# Patient Record
Sex: Male | Born: 1955 | Race: White | Hispanic: No | Smoking: Current every day smoker
Health system: Southern US, Community
[De-identification: ages and names within clinical notes are randomized; demographics above are authoritative.]

## PROBLEM LIST (undated history)

## (undated) DIAGNOSIS — G250 Essential tremor: Secondary | ICD-10-CM

## (undated) DIAGNOSIS — I2510 Atherosclerotic heart disease of native coronary artery without angina pectoris: Secondary | ICD-10-CM

## (undated) DIAGNOSIS — I10 Essential (primary) hypertension: Secondary | ICD-10-CM

## (undated) DIAGNOSIS — G4700 Insomnia, unspecified: Secondary | ICD-10-CM

## (undated) DIAGNOSIS — G629 Polyneuropathy, unspecified: Secondary | ICD-10-CM

## (undated) DIAGNOSIS — K6389 Other specified diseases of intestine: Secondary | ICD-10-CM

## (undated) DIAGNOSIS — M5430 Sciatica, unspecified side: Secondary | ICD-10-CM

## (undated) DIAGNOSIS — F419 Anxiety disorder, unspecified: Secondary | ICD-10-CM

## (undated) DIAGNOSIS — R0600 Dyspnea, unspecified: Secondary | ICD-10-CM

## (undated) DIAGNOSIS — J45909 Unspecified asthma, uncomplicated: Secondary | ICD-10-CM

## (undated) DIAGNOSIS — G8929 Other chronic pain: Secondary | ICD-10-CM

## (undated) DIAGNOSIS — Z8619 Personal history of other infectious and parasitic diseases: Secondary | ICD-10-CM

## (undated) DIAGNOSIS — M549 Dorsalgia, unspecified: Secondary | ICD-10-CM

## (undated) DIAGNOSIS — M1990 Unspecified osteoarthritis, unspecified site: Secondary | ICD-10-CM

## (undated) DIAGNOSIS — H269 Unspecified cataract: Secondary | ICD-10-CM

## (undated) DIAGNOSIS — K5900 Constipation, unspecified: Secondary | ICD-10-CM

## (undated) DIAGNOSIS — M359 Systemic involvement of connective tissue, unspecified: Secondary | ICD-10-CM

## (undated) DIAGNOSIS — J449 Chronic obstructive pulmonary disease, unspecified: Secondary | ICD-10-CM

## (undated) DIAGNOSIS — J189 Pneumonia, unspecified organism: Secondary | ICD-10-CM

## (undated) HISTORY — DX: Sciatica, unspecified side: M54.30

## (undated) HISTORY — DX: Essential (primary) hypertension: I10

## (undated) HISTORY — PX: CIRCUMCISION: SUR203

## (undated) HISTORY — DX: Essential tremor: G25.0

## (undated) HISTORY — PX: OTHER SURGICAL HISTORY: SHX169

## (undated) HISTORY — PX: ABDOMINAL SURGERY: SHX537

## (undated) HISTORY — DX: Personal history of other infectious and parasitic diseases: Z86.19

## (undated) HISTORY — DX: Other specified diseases of intestine: K63.89

## (undated) HISTORY — PX: NASAL SEPTUM SURGERY: SHX37

## (undated) HISTORY — PX: TONSILLECTOMY: SUR1361

## (undated) HISTORY — DX: Atherosclerotic heart disease of native coronary artery without angina pectoris: I25.10

## (undated) HISTORY — PX: TYMPANOSTOMY TUBE PLACEMENT: SHX32

## (undated) HISTORY — PX: COLON SURGERY: SHX602

---

## 2013-02-15 ENCOUNTER — Emergency Department (HOSPITAL_COMMUNITY)
Admission: EM | Admit: 2013-02-15 | Discharge: 2013-02-15 | Disposition: A | Discharge status: Home / Self Care | Payer: Medicaid - Out of State | Attending: Emergency Medicine | Admitting: Emergency Medicine

## 2013-02-15 ENCOUNTER — Encounter (HOSPITAL_COMMUNITY): Payer: Self-pay

## 2013-02-15 ENCOUNTER — Emergency Department (HOSPITAL_COMMUNITY): Payer: Medicaid - Out of State

## 2013-02-15 DIAGNOSIS — J329 Chronic sinusitis, unspecified: Secondary | ICD-10-CM | POA: Insufficient documentation

## 2013-02-15 DIAGNOSIS — W010XXA Fall on same level from slipping, tripping and stumbling without subsequent striking against object, initial encounter: Secondary | ICD-10-CM | POA: Insufficient documentation

## 2013-02-15 DIAGNOSIS — J209 Acute bronchitis, unspecified: Secondary | ICD-10-CM

## 2013-02-15 DIAGNOSIS — J3489 Other specified disorders of nose and nasal sinuses: Secondary | ICD-10-CM | POA: Insufficient documentation

## 2013-02-15 DIAGNOSIS — M549 Dorsalgia, unspecified: Secondary | ICD-10-CM | POA: Insufficient documentation

## 2013-02-15 DIAGNOSIS — IMO0002 Reserved for concepts with insufficient information to code with codable children: Secondary | ICD-10-CM | POA: Insufficient documentation

## 2013-02-15 DIAGNOSIS — R51 Headache: Secondary | ICD-10-CM | POA: Insufficient documentation

## 2013-02-15 DIAGNOSIS — R0982 Postnasal drip: Secondary | ICD-10-CM | POA: Insufficient documentation

## 2013-02-15 DIAGNOSIS — S46911A Strain of unspecified muscle, fascia and tendon at shoulder and upper arm level, right arm, initial encounter: Secondary | ICD-10-CM

## 2013-02-15 DIAGNOSIS — Z8669 Personal history of other diseases of the nervous system and sense organs: Secondary | ICD-10-CM | POA: Insufficient documentation

## 2013-02-15 DIAGNOSIS — H6120 Impacted cerumen, unspecified ear: Secondary | ICD-10-CM | POA: Insufficient documentation

## 2013-02-15 DIAGNOSIS — I10 Essential (primary) hypertension: Secondary | ICD-10-CM | POA: Insufficient documentation

## 2013-02-15 DIAGNOSIS — Y939 Activity, unspecified: Secondary | ICD-10-CM | POA: Insufficient documentation

## 2013-02-15 DIAGNOSIS — Y929 Unspecified place or not applicable: Secondary | ICD-10-CM | POA: Insufficient documentation

## 2013-02-15 DIAGNOSIS — F17200 Nicotine dependence, unspecified, uncomplicated: Secondary | ICD-10-CM | POA: Insufficient documentation

## 2013-02-15 DIAGNOSIS — Z79899 Other long term (current) drug therapy: Secondary | ICD-10-CM | POA: Insufficient documentation

## 2013-02-15 DIAGNOSIS — H938X9 Other specified disorders of ear, unspecified ear: Secondary | ICD-10-CM | POA: Insufficient documentation

## 2013-02-15 DIAGNOSIS — R05 Cough: Secondary | ICD-10-CM | POA: Insufficient documentation

## 2013-02-15 DIAGNOSIS — R062 Wheezing: Secondary | ICD-10-CM | POA: Insufficient documentation

## 2013-02-15 DIAGNOSIS — R059 Cough, unspecified: Secondary | ICD-10-CM | POA: Insufficient documentation

## 2013-02-15 DIAGNOSIS — G8929 Other chronic pain: Secondary | ICD-10-CM | POA: Insufficient documentation

## 2013-02-15 HISTORY — DX: Other chronic pain: G89.29

## 2013-02-15 HISTORY — DX: Dorsalgia, unspecified: M54.9

## 2013-02-15 MED ORDER — ALBUTEROL SULFATE (5 MG/ML) 0.5% IN NEBU
5.0000 mg | INHALATION_SOLUTION | Freq: Once | RESPIRATORY_TRACT | Status: AC
  Administered 2013-02-15: 5 mg via RESPIRATORY_TRACT
  Filled 2013-02-15: qty 1

## 2013-02-15 MED ORDER — AMOXICILLIN 500 MG PO CAPS: 500.0000 mg | ORAL_CAPSULE | Freq: Three times a day (TID) | ORAL | Status: DC

## 2013-02-15 MED ORDER — BENZONATATE 100 MG PO CAPS
200.0000 mg | ORAL_CAPSULE | Freq: Once | ORAL | Status: AC
  Administered 2013-02-15: 200 mg via ORAL
  Filled 2013-02-15: qty 2

## 2013-02-15 MED ORDER — BENZONATATE 100 MG PO CAPS: 100.0000 mg | ORAL_CAPSULE | Freq: Three times a day (TID) | ORAL | Status: DC

## 2013-02-15 MED ORDER — ALBUTEROL SULFATE HFA 108 (90 BASE) MCG/ACT IN AERS: 1.0000 | INHALATION_SPRAY | Freq: Four times a day (QID) | RESPIRATORY_TRACT | Status: DC | PRN

## 2013-02-15 MED ORDER — PREDNISONE 10 MG PO TABS: ORAL_TABLET | ORAL | Status: DC

## 2013-02-15 MED ORDER — ALBUTEROL SULFATE (5 MG/ML) 0.5% IN NEBU
INHALATION_SOLUTION | RESPIRATORY_TRACT | Status: AC
  Filled 2013-02-15: qty 1

## 2013-02-15 MED ORDER — METHOCARBAMOL 500 MG PO TABS: 1000.0000 mg | ORAL_TABLET | Freq: Four times a day (QID) | ORAL | Status: AC

## 2013-02-15 MED ORDER — ALBUTEROL SULFATE (5 MG/ML) 0.5% IN NEBU
5.0000 mg | INHALATION_SOLUTION | Freq: Once | RESPIRATORY_TRACT | Status: AC
  Administered 2013-02-15: 5 mg via RESPIRATORY_TRACT

## 2013-02-15 MED ORDER — IPRATROPIUM BROMIDE 0.02 % IN SOLN
RESPIRATORY_TRACT | Status: AC
  Filled 2013-02-15: qty 2.5

## 2013-02-15 MED ORDER — IPRATROPIUM BROMIDE 0.02 % IN SOLN
0.5000 mg | Freq: Once | RESPIRATORY_TRACT | Status: AC
  Administered 2013-02-15: 0.5 mg via RESPIRATORY_TRACT

## 2013-02-15 MED ORDER — OXYCODONE-ACETAMINOPHEN 5-325 MG PO TABS
2.0000 | ORAL_TABLET | Freq: Once | ORAL | Status: AC
  Administered 2013-02-15: 2 via ORAL
  Filled 2013-02-15: qty 2

## 2013-02-15 MED ORDER — IPRATROPIUM BROMIDE 0.02 % IN SOLN
0.5000 mg | Freq: Once | RESPIRATORY_TRACT | Status: AC
  Administered 2013-02-15: 0.5 mg via RESPIRATORY_TRACT
  Filled 2013-02-15: qty 2.5

## 2013-02-15 NOTE — ED Notes (Signed)
Complain of left ear stopped up, sinus infection and right arm injury

## 2013-02-15 NOTE — ED Notes (Signed)
Pt requested something for pain.  Says has a friend that can drive him home.  Notified PA and percocet given.

## 2013-02-19 NOTE — ED Provider Notes (Signed)
History     CSN: [id number]  Arrival date & time 02/15/13  0906   First MD Initiated Contact with Patient 02/15/13 [phone]      Chief Complaint  Patient presents with  . Otalgia    (Consider location/radiation/quality/duration/timing/severity/associated sxs/prior treatment) HPI Comments: Peter Campbell is a 57 y.o. Male who is visiting relatives here for the next month,  With multiple complaints.  First,  He describes nasal congestion along with thick nasal discharge and facial pain, left ear pressure and decreased hearing along with persistent post nasal drip and occasional productive cough.  He denies shortness of breath and chest pain currently but has had intermittent wheezing.  Additionally, he describes acute on chronic right shoulder pain which is worse with movement ad treated with roxicodone and norco by his pcp.  He has had increased pain since tripping and landing on his right side last week.  He denies weakness and numbness in his right arm or hand.  Pain is worse with movement and improved at rest.   The history is provided by the patient.    Past Medical History  Diagnosis Date  . Hypertension   . Chronic back pain   . Tremors of nervous system     Past Surgical History  Procedure Laterality Date  . Colon surgery    . Abdominal surgery      No family history on file.  History  Substance Use Topics  . Smoking status: Current Some Day Smoker  . Smokeless tobacco: Not on file  . Alcohol Use: No      Review of Systems  Constitutional: Negative for fever and chills.  HENT: Positive for congestion, rhinorrhea and sinus pressure. Negative for ear pain, sore throat and neck pain.   Eyes: Negative.   Respiratory: Positive for cough and wheezing. Negative for chest tightness and shortness of breath.   Cardiovascular: Negative for chest pain.  Gastrointestinal: Negative for nausea and abdominal pain.  Genitourinary: Negative.   Musculoskeletal: Positive for  arthralgias. Negative for joint swelling.  Skin: Negative.  Negative for rash and wound.  Neurological: Negative for dizziness, weakness, light-headedness, numbness and headaches.  Psychiatric/Behavioral: Negative.     Allergies  Morphine and related  Home Medications   Current Outpatient Rx  Name  Route  Sig  Dispense  Refill  . amLODipine (NORVASC) 5 MG tablet   Oral   Take 5 mg by mouth daily.         . celecoxib (CELEBREX) 200 MG capsule   Oral   Take 200 mg by mouth daily.         Marland Kitchen HYDROcodone-acetaminophen (NORCO) 10-325 MG per tablet   Oral   Take 1 tablet by mouth every 6 (six) hours as needed for pain (breakthrough pain).         Marland Kitchen lisinopril (PRINIVIL,ZESTRIL) 10 MG tablet   Oral   Take 10 mg by mouth at bedtime.         Marland Kitchen oxycodone (ROXICODONE) 30 MG immediate release tablet   Oral   Take 30 mg by mouth 2 (two) times daily.         . pregabalin (LYRICA) 100 MG capsule   Oral   Take 100 mg by mouth 2 (two) times daily.         . temazepam (RESTORIL) 30 MG capsule   Oral   Take 30 mg by mouth at bedtime as needed for sleep.         Marland Kitchen albuterol (  PROVENTIL HFA;VENTOLIN HFA) 108 (90 BASE) MCG/ACT inhaler   Inhalation   Inhale 1-2 puffs into the lungs every 6 (six) hours as needed for wheezing.   1 Inhaler   0   . amoxicillin (AMOXIL) 500 MG capsule   Oral   Take 1 capsule (500 mg total) by mouth 3 (three) times daily.   30 capsule   0   . benzonatate (TESSALON) 100 MG capsule   Oral   Take 1 capsule (100 mg total) by mouth every 8 (eight) hours.   21 capsule   0   . methocarbamol (ROBAXIN) 500 MG tablet   Oral   Take 2 tablets (1,000 mg total) by mouth 4 (four) times daily.   40 tablet   0   . predniSONE (DELTASONE) 10 MG tablet      6, 5, 4, 3, 2 then 1 tablet by mouth daily for 6 days total.   21 tablet   0     BP 128/79  Pulse 79  Temp(Src) 97.4 F (36.3 C)  Resp 20  Ht 5\' 10"  (1.778 m)  Wt 180 lb (81.647 kg)  BMI  25.83 kg/m2  SpO2 96%  Physical Exam  Constitutional: He is oriented to person, place, and time. He appears well-developed and well-nourished.  HENT:  Head: Normocephalic and atraumatic.  Right Ear: Tympanic membrane and ear canal normal. Tympanic membrane is not injected and not erythematous.  Left Ear: Tympanic membrane and ear canal normal.  Nose: Mucosal edema and rhinorrhea present. Right sinus exhibits maxillary sinus tenderness. Left sinus exhibits maxillary sinus tenderness.  Mouth/Throat: Uvula is midline, oropharynx is clear and moist and mucous membranes are normal. No oropharyngeal exudate, posterior oropharyngeal edema, posterior oropharyngeal erythema or tonsillar abscesses.  Left ear canal cerumen impaction.  Eyes: Conjunctivae are normal.  Cardiovascular: Normal rate and normal heart sounds.   Pulmonary/Chest: Effort normal. No respiratory distress. He has no decreased breath sounds. He has wheezes. He has no rhonchi. He has no rales.  Prolonged expiratory phase.  Abdominal: Soft. There is no tenderness.  Musculoskeletal: Normal range of motion.       Right shoulder: He exhibits bony tenderness and spasm. He exhibits no swelling, no effusion, no crepitus, no deformity, normal pulse and normal strength.  ttp with spasm across right upper shoulder/trapezius musculature.  Neurological: He is alert and oriented to person, place, and time.  Skin: Skin is warm and dry. No rash noted.  Psychiatric: He has a normal mood and affect.    ED Course  Procedures (including critical care time)  Left ear flushed by RN with complete removal of cerum, normal left tm,  Outer canal normal,  Hearing improved.  Pt was given albuterol /atrovent neb also with resolution of wheezing,  Cough improved.  Labs Reviewed - No data to display No results found.   1. Sinusitis   2. Shoulder strain, right, initial encounter   3. Bronchitis with bronchospasm    4.  Cerumen impaction   MDM   Patients labs and/or radiological studies were viewed and considered during the medical decision making and disposition process.  Pt given right arm sling for comfort,  encouraged rest,  Heat tx.  Pt prescribed prednisone 6 day taper,  Albuterol for bronchospasm.  Tessalon perles prn cough,  Amoxil for acute sinusitis.  Robaxin also for right shoulder and upper back spasm.  Pt encouraged to continue with other pain meds as prescribed by pcp.  Recheck prn worsened sx.  Burgess Amor, PA-C 02/19/13 1338

## 2013-02-19 NOTE — ED Provider Notes (Signed)
Medical screening examination/treatment/procedure(s) were performed by non-physician practitioner and as supervising physician I was immediately available for consultation/collaboration.   Glynn Octave, MD 02/19/13 1540

## 2013-08-07 ENCOUNTER — Other Ambulatory Visit (HOSPITAL_COMMUNITY): Payer: Self-pay | Admitting: General Practice

## 2013-08-07 DIAGNOSIS — R222 Localized swelling, mass and lump, trunk: Secondary | ICD-10-CM

## 2013-08-09 ENCOUNTER — Ambulatory Visit (HOSPITAL_COMMUNITY)
Admission: RE | Admit: 2013-08-09 | Discharge: 2013-08-09 | Disposition: A | Discharge status: Home / Self Care | Payer: Medicare Other | Source: Ambulatory Visit | Attending: General Practice | Admitting: General Practice

## 2013-08-09 DIAGNOSIS — R222 Localized swelling, mass and lump, trunk: Secondary | ICD-10-CM

## 2013-08-09 DIAGNOSIS — R911 Solitary pulmonary nodule: Secondary | ICD-10-CM | POA: Insufficient documentation

## 2013-08-09 DIAGNOSIS — Z09 Encounter for follow-up examination after completed treatment for conditions other than malignant neoplasm: Secondary | ICD-10-CM | POA: Diagnosis not present

## 2013-08-17 DIAGNOSIS — G250 Essential tremor: Secondary | ICD-10-CM | POA: Insufficient documentation

## 2013-08-17 DIAGNOSIS — F1011 Alcohol abuse, in remission: Secondary | ICD-10-CM | POA: Insufficient documentation

## 2013-08-17 DIAGNOSIS — I2510 Atherosclerotic heart disease of native coronary artery without angina pectoris: Secondary | ICD-10-CM | POA: Insufficient documentation

## 2013-08-17 DIAGNOSIS — Z87898 Personal history of other specified conditions: Secondary | ICD-10-CM | POA: Insufficient documentation

## 2013-08-17 DIAGNOSIS — I10 Essential (primary) hypertension: Secondary | ICD-10-CM | POA: Insufficient documentation

## 2013-08-17 DIAGNOSIS — G629 Polyneuropathy, unspecified: Secondary | ICD-10-CM | POA: Insufficient documentation

## 2013-09-04 ENCOUNTER — Other Ambulatory Visit (HOSPITAL_COMMUNITY): Payer: Self-pay | Admitting: Physician Assistant

## 2013-09-04 DIAGNOSIS — M2550 Pain in unspecified joint: Secondary | ICD-10-CM

## 2013-09-07 ENCOUNTER — Ambulatory Visit (HOSPITAL_COMMUNITY)
Admission: RE | Admit: 2013-09-07 | Discharge: 2013-09-07 | Disposition: A | Discharge status: Home / Self Care | Payer: Medicare Other | Source: Ambulatory Visit | Attending: Physician Assistant | Admitting: Physician Assistant

## 2013-09-07 DIAGNOSIS — S46819A Strain of other muscles, fascia and tendons at shoulder and upper arm level, unspecified arm, initial encounter: Secondary | ICD-10-CM | POA: Diagnosis not present

## 2013-09-07 DIAGNOSIS — M67919 Unspecified disorder of synovium and tendon, unspecified shoulder: Secondary | ICD-10-CM | POA: Insufficient documentation

## 2013-09-07 DIAGNOSIS — M719 Bursopathy, unspecified: Secondary | ICD-10-CM | POA: Insufficient documentation

## 2013-09-07 DIAGNOSIS — M25519 Pain in unspecified shoulder: Secondary | ICD-10-CM | POA: Insufficient documentation

## 2013-09-07 DIAGNOSIS — X58XXXA Exposure to other specified factors, initial encounter: Secondary | ICD-10-CM | POA: Insufficient documentation

## 2013-09-07 DIAGNOSIS — M259 Joint disorder, unspecified: Secondary | ICD-10-CM | POA: Diagnosis not present

## 2013-09-07 DIAGNOSIS — M2550 Pain in unspecified joint: Secondary | ICD-10-CM

## 2013-09-25 DIAGNOSIS — G250 Essential tremor: Secondary | ICD-10-CM

## 2013-09-25 DIAGNOSIS — M549 Dorsalgia, unspecified: Secondary | ICD-10-CM

## 2013-09-25 DIAGNOSIS — G252 Other specified forms of tremor: Secondary | ICD-10-CM | POA: Insufficient documentation

## 2013-09-25 DIAGNOSIS — Z2821 Immunization not carried out because of patient refusal: Secondary | ICD-10-CM | POA: Insufficient documentation

## 2013-09-25 DIAGNOSIS — Z79899 Other long term (current) drug therapy: Secondary | ICD-10-CM

## 2013-09-25 DIAGNOSIS — G4700 Insomnia, unspecified: Secondary | ICD-10-CM

## 2013-09-25 DIAGNOSIS — K5900 Constipation, unspecified: Secondary | ICD-10-CM

## 2013-09-25 DIAGNOSIS — G8929 Other chronic pain: Secondary | ICD-10-CM | POA: Insufficient documentation

## 2013-09-25 DIAGNOSIS — F411 Generalized anxiety disorder: Secondary | ICD-10-CM

## 2013-09-25 DIAGNOSIS — M79609 Pain in unspecified limb: Secondary | ICD-10-CM

## 2013-09-25 DIAGNOSIS — I10 Essential (primary) hypertension: Secondary | ICD-10-CM

## 2013-09-25 DIAGNOSIS — F419 Anxiety disorder, unspecified: Secondary | ICD-10-CM

## 2013-09-25 DIAGNOSIS — Z87891 Personal history of nicotine dependence: Secondary | ICD-10-CM

## 2013-09-25 DIAGNOSIS — R911 Solitary pulmonary nodule: Secondary | ICD-10-CM

## 2013-09-25 DIAGNOSIS — IMO0002 Reserved for concepts with insufficient information to code with codable children: Secondary | ICD-10-CM | POA: Insufficient documentation

## 2013-09-25 DIAGNOSIS — G894 Chronic pain syndrome: Secondary | ICD-10-CM

## 2013-09-25 DIAGNOSIS — M25519 Pain in unspecified shoulder: Secondary | ICD-10-CM | POA: Insufficient documentation

## 2013-09-26 ENCOUNTER — Ambulatory Visit (INDEPENDENT_AMBULATORY_CARE_PROVIDER_SITE_OTHER): Payer: Medicare Other | Admitting: Cardiology

## 2013-09-26 ENCOUNTER — Encounter: Payer: Self-pay | Admitting: Cardiology

## 2013-09-26 VITALS — BP 125/82 | HR 89 | Ht 70.0 in | Wt 166.0 lb

## 2013-09-26 DIAGNOSIS — I2510 Atherosclerotic heart disease of native coronary artery without angina pectoris: Secondary | ICD-10-CM

## 2013-09-26 DIAGNOSIS — F17200 Nicotine dependence, unspecified, uncomplicated: Secondary | ICD-10-CM

## 2013-09-26 DIAGNOSIS — I10 Essential (primary) hypertension: Secondary | ICD-10-CM

## 2013-09-26 DIAGNOSIS — Z720 Tobacco use: Secondary | ICD-10-CM | POA: Insufficient documentation

## 2013-09-26 MED ORDER — NITROGLYCERIN 0.4 MG SL SUBL: 0.4000 mg | SUBLINGUAL_TABLET | SUBLINGUAL | Status: DC | PRN

## 2013-09-26 NOTE — Assessment & Plan Note (Signed)
Based on chest CT imaging from November of this year. Patient is reporting intermittent exertional angina symptoms. His ECG today is normal. We have discussed options for evaluation including objective functional ischemic testing versus coronary angiography. Plan at this point is to continue medical therapy and proceed with a Lexiscan Cardiolite to assess ischemic burden. Based on this we can determine whether we will continue observation versus proceeding on to assess revascularization options. We did give him a prescription for nitroglycerin, will also assess lipid panel.

## 2013-09-26 NOTE — Assessment & Plan Note (Signed)
Smoking cessation has been discussed. 

## 2013-09-26 NOTE — Patient Instructions (Signed)
Your physician recommends that you schedule a follow-up appointment in: one month   The proper use and anticipated side effects of nitroglycerine has been carefully explained.  If a single episode of chest pain is not relieved by one tablet, the patient will try another within 5 minutes; and if this doesn't relieve the pain, the patient is instructed to call 911 for transportation to an emergency department.  Your physician has requested that you have a lexiscan myoview. For further information please visit [email or web address] Please follow instruction sheet, as given.   Your physician recommends that you return for lab work tomorrow   Do not eat or drink after midnight

## 2013-09-26 NOTE — Progress Notes (Signed)
Clinical Summary Peter Campbell is a 56 y.o.male referred for cardiology evaluation by Dr. Reinaldo Berber at [facility]. He reports a history of intermittent exertional chest pain suggestive of angina over the last few months at least. He tells me that he relocated from [place] back to this area to be closer to family, moved in March of this year. He had undergone prior cardiac testing approximately 3 years ago, tells me that things looked "normal." He does have a family history of CAD, brother died suddenly with a massive heart attack.  Patient was seen by [facility] cardiology in November, details are not clear. He did undergo a CT scan of his chest at [facility] also in November that demonstrated dense coronary atherosclerosis, presumably prompting his referral. He has not undergone any objective functional ischemic testing as yet.  Medications are outlined below. He is also taking an aspirin daily. No assessment of lipids found.  ECG done in clinic today shows normal sinus rhythm.   Allergies  Allergen Reactions  . Morphine And Related Itching    Current Outpatient Prescriptions  Medication Sig Dispense Refill  . ALPRAZolam (XANAX) 1 MG tablet Take 1 mg by mouth 3 (three) times daily.      Marland Kitchen amLODipine (NORVASC) 5 MG tablet Take 5 mg by mouth daily.      Marland Kitchen gabapentin (NEURONTIN) 400 MG capsule Take 400 mg by mouth 3 (three) times daily.      . metoprolol succinate (TOPROL-XL) 25 MG 24 hr tablet Take 25 mg by mouth 2 (two) times daily.      . temazepam (RESTORIL) 30 MG capsule Take 30 mg by mouth at bedtime as needed for sleep.      . nitroGLYCERIN (NITROSTAT) 0.4 MG SL tablet Place 1 tablet (0.4 mg total) under the tongue every 5 (five) minutes as needed for chest pain.  25 tablet  3   No current facility-administered medications for this visit.    Past Medical History  Diagnosis Date  . Essential hypertension, benign   . Chronic back pain   . Essential tremor   . Coronary  atherosclerosis of native coronary artery     Dense  coronary atherosclerosis by chest CT November 2014   . History of chicken pox   . History of [place] measles   . Sciatica     Past Surgical History  Procedure Laterality Date  . Colon surgery    . Abdominal surgery    . Tonsillectomy    . Circumcision      Family History  Problem Relation Age of Onset  . COPD Father   . Cancer Father   . Cancer Mother   . Heart attack Brother   . Cancer Sister     Social History Mr. Dumont reports that he has been smoking Cigarettes.  He has been smoking about 0.00 packs per day. He does not have any smokeless tobacco history on file. Mr. Willden reports that he does not drink alcohol.  Review of Systems Chronic tremor. Limited ambulation, back pain. No palpitations or syncope. No orthopnea or PND. Stable appetite, no bleeding problems. Otherwise negative.  Physical Examination Filed Vitals:   09/26/13 0850  BP: 125/82  Pulse: 89   Filed Weights   09/26/13 0850  Weight: 166 lb (75.297 kg)   The patient appears comfortable at rest. HEENT: Conjunctiva and lids normal, oropharynx clear with moist mucosa. Neck: Supple, no elevated JVP or carotid bruits, no thyromegaly. Lungs: Clear to  auscultation, nonlabored breathing at rest. Cardiac: Regular rate and rhythm, no S3, soft systolic murmur, no pericardial rub. Abdomen: Soft, nontender, bowel sounds present, no guarding or rebound. Extremities: No pitting edema, distal pulses 1-2+. Skin: Warm and dry. Scattered tattoos. Musculoskeletal: No kyphosis. Neuropsychiatric: Alert and oriented x3, affect grossly appropriate. Resting tremor.   Problem List and Plan   Coronary atherosclerosis of native coronary artery Based on chest CT imaging from November of this year. Patient is reporting intermittent exertional angina symptoms. His ECG today is normal. We have discussed options for evaluation including objective functional ischemic testing  versus coronary angiography. Plan at this point is to continue medical therapy and proceed with a Lexiscan Cardiolite to assess ischemic burden. Based on this we can determine whether we will continue observation versus proceeding on to assess revascularization options. We did give him a prescription for nitroglycerin, will also assess lipid panel.  Tobacco abuse Smoking cessation has been discussed.  Essential hypertension, benign Blood pressure reasonable today, no changes made in antihypertensive regimen.    Jonelle Sidle, M.D., F.A.C.C.

## 2013-09-26 NOTE — Assessment & Plan Note (Signed)
Blood pressure reasonable today, no changes made in antihypertensive regimen.

## 2013-09-27 LAB — HEPATIC FUNCTION PANEL
ALT: 8 U/L (ref 0–53)
Albumin: 4 g/dL (ref 3.5–5.2)
Total Protein: 6.5 g/dL (ref 6.0–8.3)

## 2013-09-27 LAB — LIPID PANEL
HDL: 27 mg/dL — ABNORMAL LOW (ref >39)
LDL Cholesterol: 49 mg/dL (ref 0–99)
Total CHOL/HDL Ratio: 3.6 Ratio
VLDL: 20 mg/dL (ref 0–40)

## 2013-10-03 ENCOUNTER — Encounter (HOSPITAL_COMMUNITY): Payer: Self-pay

## 2013-10-03 ENCOUNTER — Encounter (HOSPITAL_COMMUNITY)
Admission: RE | Admit: 2013-10-03 | Discharge: 2013-10-03 | Disposition: A | Discharge status: Home / Self Care | Payer: Medicare Other | Source: Ambulatory Visit | Attending: Cardiology | Admitting: Cardiology

## 2013-10-03 DIAGNOSIS — I2510 Atherosclerotic heart disease of native coronary artery without angina pectoris: Secondary | ICD-10-CM

## 2013-10-03 DIAGNOSIS — R079 Chest pain, unspecified: Secondary | ICD-10-CM

## 2013-10-03 DIAGNOSIS — I10 Essential (primary) hypertension: Secondary | ICD-10-CM | POA: Insufficient documentation

## 2013-10-03 HISTORY — DX: Systemic involvement of connective tissue, unspecified: M35.9

## 2013-10-03 HISTORY — DX: Unspecified asthma, uncomplicated: J45.909

## 2013-10-03 MED ORDER — SODIUM CHLORIDE 0.9 % IJ SOLN
INTRAMUSCULAR | Status: AC
  Administered 2013-10-03: 10 mL via INTRAVENOUS
  Filled 2013-10-03: qty 10

## 2013-10-03 MED ORDER — TECHNETIUM TC 99M SESTAMIBI - CARDIOLITE
10.0000 | Freq: Once | INTRAVENOUS | Status: AC | PRN
  Administered 2013-10-03: 08:00:00 10 via INTRAVENOUS

## 2013-10-03 MED ORDER — REGADENOSON 0.4 MG/5ML IV SOLN
INTRAVENOUS | Status: AC
  Administered 2013-10-03: 0.4 mg via INTRAVENOUS
  Filled 2013-10-03: qty 5

## 2013-10-03 MED ORDER — TECHNETIUM TC 99M SESTAMIBI - CARDIOLITE
30.0000 | Freq: Once | INTRAVENOUS | Status: AC | PRN
  Administered 2013-10-03: 30 via INTRAVENOUS

## 2013-10-03 NOTE — Progress Notes (Signed)
Stress Lab Nurses Notes - Peter Campbell  Peter Campbell 10/03/2013 Reason for doing test: CAD and Chest Pain Type of test: Marlane HatcherLexiscan Cardiolite Nurse performing test: Peter PoissonPhyllis Billingsly, RN Nuclear Medicine Tech: Peter Campbell Echo Tech: Not Applicable MD performing test: Peter Campbell Family MD: Peter Campbell Test explained and consent signed: yes IV started: 22g jelco, Saline lock flushed, No redness or edema and Saline lock started in radiology Symptoms: headache Treatment/Intervention: None Reason test stopped: protocol completed After recovery IV was: Discontinued via X-ray tech and No redness or edema Patient to return to Nuc. Med at : 9:30 Patient discharged: Home Patient's Condition upon discharge was: stable Comments: During test BP 119/55 & HR 99.  Recovery BP 127/70 & HR 74.  Symptoms resolved in recovery. Peter Campbell, Peter Campbell

## 2013-10-12 DIAGNOSIS — M545 Low back pain, unspecified: Secondary | ICD-10-CM | POA: Insufficient documentation

## 2013-10-12 DIAGNOSIS — R079 Chest pain, unspecified: Secondary | ICD-10-CM | POA: Insufficient documentation

## 2013-10-12 DIAGNOSIS — K5790 Diverticulosis of intestine, part unspecified, without perforation or abscess without bleeding: Secondary | ICD-10-CM | POA: Insufficient documentation

## 2013-10-31 ENCOUNTER — Encounter: Payer: Self-pay | Admitting: Cardiology

## 2013-10-31 ENCOUNTER — Ambulatory Visit (INDEPENDENT_AMBULATORY_CARE_PROVIDER_SITE_OTHER): Payer: Medicare Other | Admitting: Cardiology

## 2013-10-31 VITALS — BP 128/78 | HR 75 | Ht 70.0 in | Wt 171.5 lb

## 2013-10-31 DIAGNOSIS — F17200 Nicotine dependence, unspecified, uncomplicated: Secondary | ICD-10-CM

## 2013-10-31 DIAGNOSIS — I2510 Atherosclerotic heart disease of native coronary artery without angina pectoris: Secondary | ICD-10-CM

## 2013-10-31 DIAGNOSIS — Z720 Tobacco use: Secondary | ICD-10-CM

## 2013-10-31 DIAGNOSIS — I10 Essential (primary) hypertension: Secondary | ICD-10-CM

## 2013-10-31 MED ORDER — ASPIRIN EC 81 MG PO TBEC: 81.0000 mg | DELAYED_RELEASE_TABLET | Freq: Every day | ORAL | Status: DC

## 2013-10-31 NOTE — Progress Notes (Signed)
Clinical Summary Peter Campbell is a 58 y.o.male last seen in December 2014. States that he feels better in general, no regular angina symptoms.  Lexiscan Cardiolite from January 2015 showed no evidence of scar or ischemia with normal LVEF 63%. We reviewed this today. Discussed medical therapy and observation.  Lab work from December 2014 showed total cholesterol 96, triglycerides 98, HDL 27, LDL 49, normal LFTs. Not on statin at this time.   Allergies  Allergen Reactions  . Morphine And Related Itching    Current Outpatient Prescriptions  Medication Sig Dispense Refill  . ALPRAZolam (XANAX) 1 MG tablet Take 1 mg by mouth 3 (three) times daily.      Marland Kitchen amLODipine (NORVASC) 5 MG tablet Take 5 mg by mouth daily.      Marland Kitchen gabapentin (NEURONTIN) 400 MG capsule Take 400 mg by mouth 3 (three) times daily.      Marland Kitchen HYDROcodone-acetaminophen (NORCO/VICODIN) 5-325 MG per tablet Take 1 tablet by mouth every 6 (six) hours as needed.       . metoprolol succinate (TOPROL-XL) 25 MG 24 hr tablet Take 25 mg by mouth 2 (two) times daily.      . nitroGLYCERIN (NITROSTAT) 0.4 MG SL tablet Place 1 tablet (0.4 mg total) under the tongue every 5 (five) minutes as needed for chest pain.  25 tablet  3  . temazepam (RESTORIL) 30 MG capsule Take 30 mg by mouth at bedtime as needed for sleep.      Marland Kitchen aspirin EC 81 MG tablet Take 1 tablet (81 mg total) by mouth daily.  90 tablet  3   No current facility-administered medications for this visit.    Past Medical History  Diagnosis Date  . Essential hypertension, benign   . Chronic back pain   . Essential tremor   . Coronary atherosclerosis of native coronary artery     Dense  coronary atherosclerosis by chest CT November 2014   . History of chicken pox   . History of [place] measles   . Sciatica   . Collagen vascular disease   . Asthma     Social History Mr. Ferreras reports that he has been smoking Cigarettes.  He has a 21 pack-year smoking history. He does not  have any smokeless tobacco history on file. Mr. Quebedeaux reports that he does not drink alcohol.  Review of Systems Chronic back pain. Otherwise negative.  Physical Examination Filed Vitals:   10/31/13 0816  BP: 128/78  Pulse: 75   Filed Weights   10/31/13 0816  Weight: 171 lb 8 oz (77.792 kg)    The patient appears comfortable at rest.  HEENT: Conjunctiva and lids normal, oropharynx clear with moist mucosa.  Neck: Supple, no elevated JVP or carotid bruits, no thyromegaly.  Lungs: Clear to auscultation, nonlabored breathing at rest.  Cardiac: Regular rate and rhythm, no S3, soft systolic murmur, no pericardial rub.  Abdomen: Soft, nontender, bowel sounds present, no guarding or rebound.  Extremities: No pitting edema, distal pulses 1-2+.  Skin: Warm and dry. Scattered tattoos.  Musculoskeletal: No kyphosis.  Neuropsychiatric: Alert and oriented x3, affect grossly appropriate. Resting tremor.   Problem List and Plan   Coronary atherosclerosis of native coronary artery Based on CT imaging of the chest. Recent Cardiolite showed no active ischemia however with normal LVEF, and our plan will be medical therapy and observation. He continues on aspirin, beta blocker, Norvasc. No statin with low lipids at baseline. Followup in 6 months.  Essential hypertension,  benign Blood pressure control reasonable today. No changes made.  Tobacco abuse Continue to work on smoking cessation.    Jonelle SidleSamuel G. McDowell, M.D., F.A.C.C.

## 2013-10-31 NOTE — Assessment & Plan Note (Signed)
Continue to work on smoking cessation. 

## 2013-10-31 NOTE — Assessment & Plan Note (Signed)
Based on CT imaging of the chest. Recent Cardiolite showed no active ischemia however with normal LVEF, and our plan will be medical therapy and observation. He continues on aspirin, beta blocker, Norvasc. No statin with low lipids at baseline. Followup in 6 months.

## 2013-10-31 NOTE — Patient Instructions (Addendum)
Your physician wants you to follow-up in:6 months  You will receive a reminder letter in the mail two months in advance. If you don't receive a letter, please call our office to schedule the follow-up appointment.   Your physician has recommended you make the following change in your medication:   START aspirin 81 mg daily

## 2013-10-31 NOTE — Assessment & Plan Note (Signed)
Blood pressure control reasonable today. No changes made. 

## 2014-05-21 ENCOUNTER — Other Ambulatory Visit (HOSPITAL_COMMUNITY): Payer: Self-pay | Admitting: Orthopaedic Surgery

## 2014-05-21 DIAGNOSIS — M25511 Pain in right shoulder: Secondary | ICD-10-CM

## 2014-05-22 ENCOUNTER — Encounter: Payer: Self-pay | Admitting: Cardiology

## 2014-05-22 ENCOUNTER — Ambulatory Visit (INDEPENDENT_AMBULATORY_CARE_PROVIDER_SITE_OTHER): Payer: Medicaid Other | Admitting: Cardiology

## 2014-05-22 VITALS — BP 128/82 | HR 61 | Ht 70.0 in | Wt 184.0 lb

## 2014-05-22 DIAGNOSIS — I209 Angina pectoris, unspecified: Secondary | ICD-10-CM

## 2014-05-22 DIAGNOSIS — I2510 Atherosclerotic heart disease of native coronary artery without angina pectoris: Secondary | ICD-10-CM

## 2014-05-22 DIAGNOSIS — Z720 Tobacco use: Secondary | ICD-10-CM

## 2014-05-22 DIAGNOSIS — I25119 Atherosclerotic heart disease of native coronary artery with unspecified angina pectoris: Secondary | ICD-10-CM

## 2014-05-22 DIAGNOSIS — I10 Essential (primary) hypertension: Secondary | ICD-10-CM

## 2014-05-22 DIAGNOSIS — F17200 Nicotine dependence, unspecified, uncomplicated: Secondary | ICD-10-CM

## 2014-05-22 MED ORDER — NITROGLYCERIN 0.4 MG SL SUBL
0.4000 mg | SUBLINGUAL_TABLET | SUBLINGUAL | Status: DC | PRN
Start: 2014-05-22 — End: 2016-01-31

## 2014-05-22 NOTE — Progress Notes (Signed)
Clinical Summary Peter Campbell is a 58 y.o.male last seen in February. He has been doing relatively well, reports only a few episodes where he had to use a single nitroglycerin for angina symptoms with activity. States that both of these events occurred when he was overdoing it, working outside in the heat. Otherwise no significant changes.  Lexiscan Cardiolite from January 2015 showed no evidence of scar or ischemia with normal LVEF 63%. We continue medical therapy and observation with history of coronary atherosclerosis documented by chest CT imaging.   Allergies  Allergen Reactions  . Morphine And Related Itching    Current Outpatient Prescriptions  Medication Sig Dispense Refill  . ALPRAZolam (XANAX) 1 MG tablet Take 1 mg by mouth 3 (three) times daily.      Marland Kitchen amLODipine (NORVASC) 5 MG tablet Take 5 mg by mouth daily.      Marland Kitchen aspirin EC 81 MG tablet Take 1 tablet (81 mg total) by mouth daily.  90 tablet  3  . gabapentin (NEURONTIN) 400 MG capsule Take 400 mg by mouth 3 (three) times daily.      Marland Kitchen HYDROcodone-acetaminophen (NORCO) 7.5-325 MG per tablet Take 1 tablet by mouth every 6 (six) hours as needed for moderate pain.      . metoprolol succinate (TOPROL-XL) 25 MG 24 hr tablet Take 25 mg by mouth 2 (two) times daily.      Marland Kitchen NAPROXEN PO Take 500 mg by mouth.      . nitroGLYCERIN (NITROSTAT) 0.4 MG SL tablet Place 1 tablet (0.4 mg total) under the tongue every 5 (five) minutes as needed for chest pain.  25 tablet  3  . temazepam (RESTORIL) 30 MG capsule Take 30 mg by mouth at bedtime as needed for sleep.       No current facility-administered medications for this visit.    Past Medical History  Diagnosis Date  . Essential hypertension, benign   . Chronic back pain   . Essential tremor   . Coronary atherosclerosis of native coronary artery     Dense  coronary atherosclerosis by chest CT November 2014   . History of chicken pox   . History of [place] measles   . Sciatica   .  Collagen vascular disease   . Asthma     Social History Peter Campbell reports that he has been smoking Cigarettes.  He has a 21 pack-year smoking history. He does not have any smokeless tobacco history on file. Peter Campbell reports that he does not drink alcohol.  Review of Systems Having pain in his right shoulder, orthopedic evaluation pending. Other systems reviewed and negative except as outlined.  Physical Examination Filed Vitals:   05/22/14 0847  BP: 128/82  Pulse: 61   Filed Weights   05/22/14 0847  Weight: 184 lb (83.462 kg)    The patient appears comfortable at rest.  HEENT: Conjunctiva and lids normal, oropharynx clear with moist mucosa.  Neck: Supple, no elevated JVP or carotid bruits, no thyromegaly.  Lungs: Clear to auscultation, nonlabored breathing at rest.  Cardiac: Regular rate and rhythm, no S3, soft systolic murmur, no pericardial rub.  Abdomen: Soft, nontender, bowel sounds present, no guarding or rebound.  Extremities: No pitting edema, distal pulses 1-2+.  Skin: Warm and dry. Scattered tattoos.  Musculoskeletal: No kyphosis.  Neuropsychiatric: Alert and oriented x3, affect grossly appropriate. Resting tremor.   Problem List and Plan   Coronary atherosclerosis of native coronary artery Continue medical therapy with prior documentation  of dense coronary atherosclerosis by chest CT, nonischemic functional evaluation by Cardiolite earlier this year. He reports only occasional angina. Six-month visit arranged.  Essential hypertension, benign Blood pressure control looks good today.  Tobacco abuse Continue to work on smoking cessation.    Jonelle Sidle, M.D., F.A.C.C.

## 2014-05-22 NOTE — Patient Instructions (Signed)
Your physician wants you to follow-up in: 6 months You will receive a reminder letter in the mail two months in advance. If you don't receive a letter, please call our office to schedule the follow-up appointment.     Your physician recommends that you continue on your current medications as directed. Please refer to the Current Medication list given to you today.      Thank you for choosing [facility] !        

## 2014-05-22 NOTE — Assessment & Plan Note (Signed)
Continue medical therapy with prior documentation of dense coronary atherosclerosis by chest CT, nonischemic functional evaluation by Cardiolite earlier this year. He reports only occasional angina. Six-month visit arranged.

## 2014-05-22 NOTE — Assessment & Plan Note (Signed)
Blood pressure control looks good today. 

## 2014-05-22 NOTE — Assessment & Plan Note (Signed)
Continue to work on smoking cessation. 

## 2014-05-23 ENCOUNTER — Ambulatory Visit (HOSPITAL_COMMUNITY)
Admission: RE | Admit: 2014-05-23 | Discharge: 2014-05-23 | Disposition: A | Discharge status: Home / Self Care | Payer: Medicare Other | Source: Ambulatory Visit | Attending: Orthopaedic Surgery | Admitting: Orthopaedic Surgery

## 2014-05-23 ENCOUNTER — Encounter (HOSPITAL_COMMUNITY): Payer: Self-pay

## 2014-05-23 DIAGNOSIS — M19019 Primary osteoarthritis, unspecified shoulder: Secondary | ICD-10-CM | POA: Diagnosis not present

## 2014-05-23 DIAGNOSIS — M25619 Stiffness of unspecified shoulder, not elsewhere classified: Secondary | ICD-10-CM | POA: Diagnosis not present

## 2014-05-23 DIAGNOSIS — X58XXXA Exposure to other specified factors, initial encounter: Secondary | ICD-10-CM | POA: Diagnosis not present

## 2014-05-23 DIAGNOSIS — M6281 Muscle weakness (generalized): Secondary | ICD-10-CM | POA: Insufficient documentation

## 2014-05-23 DIAGNOSIS — S46819A Strain of other muscles, fascia and tendons at shoulder and upper arm level, unspecified arm, initial encounter: Secondary | ICD-10-CM | POA: Diagnosis not present

## 2014-05-23 DIAGNOSIS — M25511 Pain in right shoulder: Secondary | ICD-10-CM

## 2014-05-23 DIAGNOSIS — M25519 Pain in unspecified shoulder: Secondary | ICD-10-CM | POA: Insufficient documentation

## 2014-05-28 ENCOUNTER — Other Ambulatory Visit: Payer: Self-pay | Admitting: Radiology

## 2014-05-30 ENCOUNTER — Encounter (HOSPITAL_COMMUNITY): Payer: Self-pay | Admitting: Pharmacy Technician

## 2014-06-06 NOTE — Patient Instructions (Signed)
Peter Campbell  06/06/2014   Your procedure is scheduled on:  06/12/14  Report to [facility] at  6:15 AM.  Call this number if you have problems the morning of surgery: [phone]   Remember:   Do not eat food or drink liquids after midnight.   Take these medicines the morning of surgery with A SIP OF WATER: Xanax, Amlodipine, Gabapentin, Hydrocodone, Metoprolol   Do not wear jewelry, make-up or nail polish.  Do not wear lotions, powders, or perfumes. You may wear deodorant.  Do not shave 48 hours prior to surgery. Men may shave face and neck.  Do not bring valuables to the hospital.  [facility] is not responsible for any belongings or valuables.               Contacts, dentures or bridgework may not be worn into surgery.  Leave suitcase in the car. After surgery it may be brought to your room.  For patients admitted to the hospital, discharge time is determined by your treatment team.               Patients discharged the day of surgery will not be allowed to drive home.  Name and phone number of your driver:   Special Instructions: Shower using CHG 2 nights before surgery and the night before surgery.  If you shower the day of surgery use CHG.  Use special wash - you have one bottle of CHG for all showers.  You should use approximately 1/3 of the bottle for each shower.   Please read over the following fact sheets that you were given: Surgical Site Infection Prevention and Anesthesia Post-op Instructions   PATIENT INSTRUCTIONS POST-ANESTHESIA  IMMEDIATELY FOLLOWING SURGERY:  Do not drive or operate machinery for the first twenty four hours after surgery.  Do not make any important decisions for twenty four hours after surgery or while taking narcotic pain medications or sedatives.  If you develop intractable nausea and vomiting or a severe headache please notify your doctor immediately.  FOLLOW-UP:  Please make an appointment with your surgeon as instructed. You do not need to follow  up with anesthesia unless specifically instructed to do so.  WOUND CARE INSTRUCTIONS (if applicable):  Keep a dry clean dressing on the anesthesia/puncture wound site if there is drainage.  Once the wound has quit draining you may leave it open to air.  Generally you should leave the bandage intact for twenty four hours unless there is drainage.  If the epidural site drains for more than 36-48 hours please call the anesthesia department.  QUESTIONS?:  Please feel free to call your physician or the hospital operator if you have any questions, and they will be happy to assist you.

## 2014-06-07 ENCOUNTER — Encounter (HOSPITAL_COMMUNITY)
Admission: RE | Admit: 2014-06-07 | Discharge: 2014-06-07 | Disposition: A | Discharge status: Home / Self Care | Payer: Medicare Other | Source: Ambulatory Visit | Attending: Orthopaedic Surgery | Admitting: Orthopaedic Surgery

## 2014-06-07 ENCOUNTER — Encounter (HOSPITAL_COMMUNITY): Payer: Self-pay

## 2014-06-07 DIAGNOSIS — Z7982 Long term (current) use of aspirin: Secondary | ICD-10-CM | POA: Diagnosis not present

## 2014-06-07 DIAGNOSIS — M719 Bursopathy, unspecified: Principal | ICD-10-CM | POA: Insufficient documentation

## 2014-06-07 DIAGNOSIS — M67919 Unspecified disorder of synovium and tendon, unspecified shoulder: Secondary | ICD-10-CM | POA: Diagnosis present

## 2014-06-07 DIAGNOSIS — M25819 Other specified joint disorders, unspecified shoulder: Secondary | ICD-10-CM | POA: Insufficient documentation

## 2014-06-07 DIAGNOSIS — M7580 Other shoulder lesions, unspecified shoulder: Secondary | ICD-10-CM

## 2014-06-07 DIAGNOSIS — Z01812 Encounter for preprocedural laboratory examination: Secondary | ICD-10-CM | POA: Insufficient documentation

## 2014-06-07 LAB — COMPREHENSIVE METABOLIC PANEL
ALK PHOS: 77 U/L (ref 39–117)
ALT: 9 U/L (ref 0–53)
ANION GAP: 9 (ref 5–15)
AST: 10 U/L (ref 0–37)
Albumin: 4.2 g/dL (ref 3.5–5.2)
BILIRUBIN TOTAL: 0.6 mg/dL (ref 0.3–1.2)
BUN: 22 mg/dL (ref 6–23)
CO2: 28 mEq/L (ref 19–32)
CREATININE: 0.76 mg/dL (ref 0.50–1.35)
Calcium: 9.6 mg/dL (ref 8.4–10.5)
Chloride: 104 mEq/L (ref 96–112)
GFR calc non Af Amer: >90 mL/min (ref >90)
GLUCOSE: 96 mg/dL (ref 70–99)
POTASSIUM: 4.1 meq/L (ref 3.7–5.3)
Sodium: 141 mEq/L (ref 137–147)
Total Protein: 7 g/dL (ref 6.0–8.3)

## 2014-06-07 LAB — CBC WITH DIFFERENTIAL/PLATELET
BASOS PCT: 0 % (ref 0–1)
Basophils Absolute: 0 10*3/uL (ref 0.0–0.1)
Eosinophils Absolute: 0.2 10*3/uL (ref 0.0–0.7)
Eosinophils Relative: 2 % (ref 0–5)
HEMATOCRIT: 41.1 % (ref 39.0–52.0)
HEMOGLOBIN: 14.3 g/dL (ref 13.0–17.0)
LYMPHS ABS: 1.8 10*3/uL (ref 0.7–4.0)
Lymphocytes Relative: 22 % (ref 12–46)
MCH: 32.1 pg (ref 26.0–34.0)
MCHC: 34.8 g/dL (ref 30.0–36.0)
MCV: 92.2 fL (ref 78.0–100.0)
MONO ABS: 0.8 10*3/uL (ref 0.1–1.0)
MONOS PCT: 10 % (ref 3–12)
NEUTROS PCT: 66 % (ref 43–77)
Neutro Abs: 5.2 10*3/uL (ref 1.7–7.7)
Platelets: 247 10*3/uL (ref 150–400)
RBC: 4.46 MIL/uL (ref 4.22–5.81)
RDW: 12.9 % (ref 11.5–15.5)
WBC: 8 10*3/uL (ref 4.0–10.5)

## 2014-06-07 LAB — URINALYSIS, ROUTINE W REFLEX MICROSCOPIC
BILIRUBIN URINE: NEGATIVE
Glucose, UA: NEGATIVE mg/dL
Hgb urine dipstick: NEGATIVE
KETONES UR: NEGATIVE mg/dL
Leukocytes, UA: NEGATIVE
NITRITE: NEGATIVE
PROTEIN: NEGATIVE mg/dL
Specific Gravity, Urine: 1.02 (ref 1.005–1.030)
Urobilinogen, UA: 0.2 mg/dL (ref 0.0–1.0)
pH: 5.5 (ref 5.0–8.0)

## 2014-06-11 NOTE — H&P (Signed)
Peter Campbell is an 58 y.o. male.   Chief Complaint: Right shoulder pain HPI: He has a year long history of right shoulder pain.  MRI in December showed a small full thickness tear of the supraspinatus.  He has had therapy and done home exercises. He has taken medication.  He still has pain in the right shoulder.  New MRI done done 05/23/14 shows the tear, although smaller.  He continues to have pain, inability to sleep well.  Surgery is now recommended as he has failed conservative treatments to date. He also has signs of impingement.  I have gone over the risks and imponderables of the right open shoulder surgery. He asked appropriate questions.  He appears to understand and accept the procedure.  Past Medical History  Diagnosis Date  . Essential hypertension, benign   . Chronic back pain   . Essential tremor   . Coronary atherosclerosis of native coronary artery     Dense  coronary atherosclerosis by chest CT November 2014   . History of chicken pox   . History of [place] measles   . Sciatica   . Collagen vascular disease   . Asthma     Past Surgical History  Procedure Laterality Date  . Colon surgery    . Abdominal surgery    . Tonsillectomy    . Circumcision      Family History  Problem Relation Age of Onset  . COPD Father   . Cancer Father   . Cancer Mother   . Heart attack Brother   . Cancer Sister    Social History:  reports that he has been smoking Cigarettes.  He has a 21 pack-year smoking history. He does not have any smokeless tobacco history on file. He reports that he does not drink alcohol or use illicit drugs.  Allergies:  Allergies  Allergen Reactions  . Morphine And Related Itching    No prescriptions prior to admission    No results found for this or any previous visit (from the past 48 hour(s)). No results found.  Review of Systems  Cardiovascular:       Hypertension and coronary artery disease.  Musculoskeletal: Positive for joint pain (Right  shoulder pain).    There were no vitals taken for this visit. Physical Exam  Constitutional: He is oriented to person, place, and time. He appears well-developed and well-nourished.  HENT:  Head: Normocephalic and atraumatic.  Eyes: EOM are normal. Pupils are equal, round, and reactive to light.  Neck: Normal range of motion. Neck supple.  Cardiovascular: Normal rate, regular rhythm, normal heart sounds and intact distal pulses.   Respiratory: Effort normal and breath sounds normal.  GI: Soft.  Musculoskeletal: He exhibits tenderness (Painful ROM of the right shoulder but full.  Pain to resisted abduction.  NV is intact.).       Right shoulder: He exhibits tenderness and pain.       Arms: Neurological: He is alert and oriented to person, place, and time. He has normal reflexes.  Skin: Skin is warm and dry.  Psychiatric: He has a normal mood and affect. His behavior is normal. Judgment and thought content normal.     Assessment/Plan Tear right rotator cuff.  For open repair.  He will be admitted to observation post surgery.  Heike Pounds 06/11/2014, 2:46 PM

## 2014-06-12 ENCOUNTER — Encounter (HOSPITAL_COMMUNITY): Payer: Self-pay | Admitting: *Deleted

## 2014-06-12 ENCOUNTER — Encounter (HOSPITAL_COMMUNITY): Payer: Medicare Other | Admitting: Anesthesiology

## 2014-06-12 ENCOUNTER — Encounter (HOSPITAL_COMMUNITY)
Admission: RE | Disposition: A | Discharge status: Home / Self Care | Payer: Self-pay | Source: Ambulatory Visit | Attending: Orthopaedic Surgery

## 2014-06-12 ENCOUNTER — Ambulatory Visit (HOSPITAL_COMMUNITY): Payer: Medicare Other | Admitting: Anesthesiology

## 2014-06-12 ENCOUNTER — Inpatient Hospital Stay (HOSPITAL_COMMUNITY)
Admission: RE | Admit: 2014-06-12 | Discharge: 2014-06-14 | DRG: 512 | Disposition: A | Discharge status: Home / Self Care | Payer: Medicare Other | Source: Ambulatory Visit | Attending: Orthopaedic Surgery | Admitting: Orthopaedic Surgery

## 2014-06-12 DIAGNOSIS — S43429A Sprain of unspecified rotator cuff capsule, initial encounter: Secondary | ICD-10-CM | POA: Diagnosis present

## 2014-06-12 DIAGNOSIS — G4700 Insomnia, unspecified: Secondary | ICD-10-CM | POA: Diagnosis present

## 2014-06-12 DIAGNOSIS — M7541 Impingement syndrome of right shoulder: Secondary | ICD-10-CM | POA: Diagnosis present

## 2014-06-12 DIAGNOSIS — X58XXXA Exposure to other specified factors, initial encounter: Secondary | ICD-10-CM | POA: Diagnosis not present

## 2014-06-12 DIAGNOSIS — M549 Dorsalgia, unspecified: Secondary | ICD-10-CM | POA: Diagnosis present

## 2014-06-12 DIAGNOSIS — Z8249 Family history of ischemic heart disease and other diseases of the circulatory system: Secondary | ICD-10-CM

## 2014-06-12 DIAGNOSIS — M25819 Other specified joint disorders, unspecified shoulder: Secondary | ICD-10-CM | POA: Diagnosis present

## 2014-06-12 DIAGNOSIS — F17200 Nicotine dependence, unspecified, uncomplicated: Secondary | ICD-10-CM | POA: Diagnosis present

## 2014-06-12 DIAGNOSIS — I2510 Atherosclerotic heart disease of native coronary artery without angina pectoris: Secondary | ICD-10-CM | POA: Diagnosis present

## 2014-06-12 DIAGNOSIS — G8929 Other chronic pain: Secondary | ICD-10-CM | POA: Diagnosis present

## 2014-06-12 DIAGNOSIS — J45909 Unspecified asthma, uncomplicated: Secondary | ICD-10-CM | POA: Diagnosis present

## 2014-06-12 DIAGNOSIS — M7580 Other shoulder lesions, unspecified shoulder: Secondary | ICD-10-CM

## 2014-06-12 DIAGNOSIS — I10 Essential (primary) hypertension: Secondary | ICD-10-CM | POA: Diagnosis present

## 2014-06-12 DIAGNOSIS — M25519 Pain in unspecified shoulder: Secondary | ICD-10-CM | POA: Diagnosis present

## 2014-06-12 HISTORY — PX: SHOULDER ACROMIOPLASTY: SHX6093

## 2014-06-12 HISTORY — PX: SHOULDER OPEN ROTATOR CUFF REPAIR: SHX2407

## 2014-06-12 LAB — GLUCOSE, CAPILLARY: Glucose-Capillary: 110 mg/dL — ABNORMAL HIGH (ref 70–99)

## 2014-06-12 SURGERY — REPAIR, ROTATOR CUFF, OPEN
Anesthesia: General | Laterality: Right

## 2014-06-12 MED ORDER — ONDANSETRON HCL 4 MG/2ML IJ SOLN
4.0000 mg | Freq: Once | INTRAMUSCULAR | Status: AC
  Administered 2014-06-12: 4 mg via INTRAVENOUS

## 2014-06-12 MED ORDER — PROPOFOL 10 MG/ML IV BOLUS
INTRAVENOUS | Status: AC
Start: 2014-06-12 — End: 2014-06-12
  Filled 2014-06-12: qty 20

## 2014-06-12 MED ORDER — MIDAZOLAM HCL 2 MG/2ML IJ SOLN
INTRAMUSCULAR | Status: AC
  Filled 2014-06-12: qty 2

## 2014-06-12 MED ORDER — NEOSTIGMINE METHYLSULFATE 10 MG/10ML IV SOLN
INTRAVENOUS | Status: DC | PRN
  Administered 2014-06-12: 2 mg via INTRAVENOUS
  Administered 2014-06-12: 1 mg via INTRAVENOUS

## 2014-06-12 MED ORDER — CEFAZOLIN SODIUM-DEXTROSE 2-3 GM-% IV SOLR
INTRAVENOUS | Status: AC
  Filled 2014-06-12: qty 50

## 2014-06-12 MED ORDER — FENTANYL CITRATE 0.05 MG/ML IJ SOLN
INTRAMUSCULAR | Status: AC
  Filled 2014-06-12: qty 5

## 2014-06-12 MED ORDER — MIDAZOLAM HCL 2 MG/2ML IJ SOLN
1.0000 mg | INTRAMUSCULAR | Status: AC | PRN
  Administered 2014-06-12 (×3): 2 mg via INTRAVENOUS
  Filled 2014-06-12 (×2): qty 2

## 2014-06-12 MED ORDER — ONDANSETRON HCL 4 MG/2ML IJ SOLN: 4.0000 mg | Freq: Four times a day (QID) | INTRAMUSCULAR | Status: DC | PRN

## 2014-06-12 MED ORDER — DEXTROSE-NACL 5-0.45 % IV SOLN
INTRAVENOUS | Status: DC
  Administered 2014-06-12 – 2014-06-13 (×2): via INTRAVENOUS

## 2014-06-12 MED ORDER — 0.9 % SODIUM CHLORIDE (POUR BTL) OPTIME
TOPICAL | Status: DC | PRN
  Administered 2014-06-12: 1000 mL

## 2014-06-12 MED ORDER — GLYCOPYRROLATE 0.2 MG/ML IJ SOLN
INTRAMUSCULAR | Status: AC
  Filled 2014-06-12: qty 2

## 2014-06-12 MED ORDER — FENTANYL 10 MCG/ML IV SOLN
INTRAVENOUS | Status: DC
  Filled 2014-06-12: qty 50

## 2014-06-12 MED ORDER — DIPHENHYDRAMINE HCL 12.5 MG/5ML PO ELIX
12.5000 mg | ORAL_SOLUTION | Freq: Four times a day (QID) | ORAL | Status: DC | PRN
Start: 2014-06-12 — End: 2014-06-14

## 2014-06-12 MED ORDER — ROCURONIUM BROMIDE 50 MG/5ML IV SOLN
INTRAVENOUS | Status: AC
  Filled 2014-06-12: qty 1

## 2014-06-12 MED ORDER — ROCURONIUM BROMIDE 100 MG/10ML IV SOLN
INTRAVENOUS | Status: DC | PRN
  Administered 2014-06-12: 45 mg via INTRAVENOUS

## 2014-06-12 MED ORDER — GLYCOPYRROLATE 0.2 MG/ML IJ SOLN
INTRAMUSCULAR | Status: DC | PRN
  Administered 2014-06-12: 0.4 mg via INTRAVENOUS

## 2014-06-12 MED ORDER — DEXAMETHASONE SODIUM PHOSPHATE 4 MG/ML IJ SOLN
4.0000 mg | Freq: Once | INTRAMUSCULAR | Status: AC
  Administered 2014-06-12: 4 mg via INTRAVENOUS

## 2014-06-12 MED ORDER — ALBUTEROL SULFATE (2.5 MG/3ML) 0.083% IN NEBU
2.5000 mg | INHALATION_SOLUTION | Freq: Four times a day (QID) | RESPIRATORY_TRACT | Status: DC
  Administered 2014-06-12 – 2014-06-13 (×4): 2.5 mg via RESPIRATORY_TRACT
  Filled 2014-06-12 (×4): qty 3

## 2014-06-12 MED ORDER — DIPHENHYDRAMINE HCL 50 MG/ML IJ SOLN: 12.5000 mg | Freq: Four times a day (QID) | INTRAMUSCULAR | Status: DC | PRN

## 2014-06-12 MED ORDER — SODIUM CHLORIDE 0.9 % IJ SOLN: 9.0000 mL | INTRAMUSCULAR | Status: DC | PRN

## 2014-06-12 MED ORDER — LIDOCAINE HCL 1 % IJ SOLN
INTRAMUSCULAR | Status: DC | PRN
  Administered 2014-06-12: 50 mg via INTRADERMAL

## 2014-06-12 MED ORDER — NALOXONE HCL 0.4 MG/ML IJ SOLN: 0.4000 mg | INTRAMUSCULAR | Status: DC | PRN

## 2014-06-12 MED ORDER — ACETAMINOPHEN 325 MG PO TABS
650.0000 mg | ORAL_TABLET | Freq: Four times a day (QID) | ORAL | Status: DC | PRN
Start: 2014-06-12 — End: 2014-06-14

## 2014-06-12 MED ORDER — MIDAZOLAM HCL 5 MG/5ML IJ SOLN
INTRAMUSCULAR | Status: DC | PRN
  Administered 2014-06-12: 2 mg via INTRAVENOUS

## 2014-06-12 MED ORDER — GLYCOPYRROLATE 0.2 MG/ML IJ SOLN
0.2000 mg | Freq: Once | INTRAMUSCULAR | Status: AC
  Administered 2014-06-12: 0.2 mg via INTRAVENOUS

## 2014-06-12 MED ORDER — FENTANYL CITRATE 0.05 MG/ML IJ SOLN
INTRAMUSCULAR | Status: DC | PRN
  Administered 2014-06-12 (×3): 50 ug via INTRAVENOUS
  Administered 2014-06-12: 100 ug via INTRAVENOUS
  Administered 2014-06-12: 50 ug via INTRAVENOUS
  Administered 2014-06-12: 100 ug via INTRAVENOUS
  Administered 2014-06-12 (×2): 50 ug via INTRAVENOUS

## 2014-06-12 MED ORDER — DIPHENHYDRAMINE HCL 12.5 MG/5ML PO ELIX: 12.5000 mg | ORAL_SOLUTION | Freq: Four times a day (QID) | ORAL | Status: DC | PRN

## 2014-06-12 MED ORDER — NEOSTIGMINE METHYLSULFATE 10 MG/10ML IV SOLN
INTRAVENOUS | Status: AC
  Filled 2014-06-12: qty 1

## 2014-06-12 MED ORDER — GLYCOPYRROLATE 0.2 MG/ML IJ SOLN
INTRAMUSCULAR | Status: AC
  Filled 2014-06-12: qty 1

## 2014-06-12 MED ORDER — LIDOCAINE HCL (PF) 1 % IJ SOLN
INTRAMUSCULAR | Status: AC
  Filled 2014-06-12: qty 5

## 2014-06-12 MED ORDER — NALOXONE HCL 0.4 MG/ML IJ SOLN
0.4000 mg | INTRAMUSCULAR | Status: DC | PRN
Start: 2014-06-12 — End: 2014-06-12

## 2014-06-12 MED ORDER — PROPOFOL 10 MG/ML IV BOLUS
INTRAVENOUS | Status: DC | PRN
  Administered 2014-06-12: 200 mg via INTRAVENOUS

## 2014-06-12 MED ORDER — FENTANYL 10 MCG/ML IV SOLN: INTRAVENOUS | Status: DC

## 2014-06-12 MED ORDER — ENOXAPARIN SODIUM 40 MG/0.4ML ~~LOC~~ SOLN
40.0000 mg | SUBCUTANEOUS | Status: DC
  Administered 2014-06-13 – 2014-06-14 (×2): 40 mg via SUBCUTANEOUS
  Filled 2014-06-12 (×2): qty 0.4

## 2014-06-12 MED ORDER — FENTANYL CITRATE 0.05 MG/ML IJ SOLN: 25.0000 ug | INTRAMUSCULAR | Status: DC | PRN

## 2014-06-12 MED ORDER — FENTANYL 10 MCG/ML IV SOLN
INTRAVENOUS | Status: DC
  Administered 2014-06-12: 10 ug via INTRAVENOUS
  Administered 2014-06-12: 21:00:00 via INTRAVENOUS
  Administered 2014-06-13: 486.6 ug via INTRAVENOUS
  Administered 2014-06-13: 665.1 ug via INTRAVENOUS
  Administered 2014-06-13: 520.5 ug via INTRAVENOUS
  Administered 2014-06-13 (×2): via INTRAVENOUS
  Administered 2014-06-13: 471.3 ug via INTRAVENOUS
  Administered 2014-06-13: 505.1 ug via INTRAVENOUS
  Administered 2014-06-13 (×3): via INTRAVENOUS
  Administered 2014-06-13: 1067 ug via INTRAVENOUS
  Administered 2014-06-14: 04:00:00 via INTRAVENOUS
  Filled 2014-06-12 (×11): qty 50

## 2014-06-12 MED ORDER — FENTANYL 10 MCG/ML IV SOLN
INTRAVENOUS | Status: DC
  Administered 2014-06-12: 10 ug via INTRAVENOUS
  Administered 2014-06-12: 150 ug via INTRAVENOUS
  Filled 2014-06-12: qty 50

## 2014-06-12 MED ORDER — ONDANSETRON HCL 4 MG/2ML IJ SOLN
INTRAMUSCULAR | Status: AC
  Filled 2014-06-12: qty 2

## 2014-06-12 MED ORDER — ZOLPIDEM TARTRATE 5 MG PO TABS
5.0000 mg | ORAL_TABLET | Freq: Every day | ORAL | Status: DC
  Administered 2014-06-12 – 2014-06-13 (×2): 5 mg via ORAL
  Filled 2014-06-12 (×2): qty 1

## 2014-06-12 MED ORDER — CHLORHEXIDINE GLUCONATE 4 % EX LIQD: 60.0000 mL | Freq: Once | CUTANEOUS | Status: DC

## 2014-06-12 MED ORDER — DEXAMETHASONE SODIUM PHOSPHATE 4 MG/ML IJ SOLN
INTRAMUSCULAR | Status: AC
  Filled 2014-06-12: qty 1

## 2014-06-12 MED ORDER — ONDANSETRON HCL 4 MG/2ML IJ SOLN: 4.0000 mg | Freq: Once | INTRAMUSCULAR | Status: DC | PRN

## 2014-06-12 MED ORDER — LACTATED RINGERS IV SOLN
INTRAVENOUS | Status: DC
Start: 2014-06-12 — End: 2014-06-12
  Administered 2014-06-12: 1000 mL via INTRAVENOUS

## 2014-06-12 MED ORDER — CEFAZOLIN SODIUM-DEXTROSE 2-3 GM-% IV SOLR
2.0000 g | INTRAVENOUS | Status: AC
  Administered 2014-06-12: 2 g via INTRAVENOUS

## 2014-06-12 SURGICAL SUPPLY — 41 items
BAG HAMPER (MISCELLANEOUS) ×3 IMPLANT
BNDG COHESIVE 4X5 TAN STRL (GAUZE/BANDAGES/DRESSINGS) ×3 IMPLANT
CLOTH BEACON ORANGE TIMEOUT ST (SAFETY) ×3 IMPLANT
COOLER CRYO CUFF IC AND MOTOR (MISCELLANEOUS) ×3 IMPLANT
COVER LIGHT HANDLE STERIS (MISCELLANEOUS) ×6 IMPLANT
COVER MAYO STAND XLG (DRAPE) ×3 IMPLANT
CUFF CRYO UNI SHDR 32X48 (MISCELLANEOUS) ×3 IMPLANT
DURAPREP 26ML APPLICATOR (WOUND CARE) ×3 IMPLANT
FORMALIN 10 PREFIL 120ML (MISCELLANEOUS) ×6 IMPLANT
GAUZE SPONGE 4X4 12PLY STRL (GAUZE/BANDAGES/DRESSINGS) ×2 IMPLANT
GAUZE XEROFORM 5X9 LF (GAUZE/BANDAGES/DRESSINGS) ×3 IMPLANT
GLOVE BIO SURGEON STRL SZ8 (GLOVE) ×3 IMPLANT
GLOVE BIO SURGEON STRL SZ8.5 (GLOVE) ×3 IMPLANT
GLOVE BIOGEL M 7.0 STRL (GLOVE) ×6 IMPLANT
GLOVE BIOGEL PI IND STRL 7.0 (GLOVE) ×3 IMPLANT
GLOVE BIOGEL PI INDICATOR 7.0 (GLOVE) ×6
GOWN STRL REUS W/TWL LRG LVL3 (GOWN DISPOSABLE) ×6 IMPLANT
GOWN STRL REUS W/TWL XL LVL3 (GOWN DISPOSABLE) ×3 IMPLANT
INST SET MINOR BONE (KITS) ×3 IMPLANT
KIT ROOM TURNOVER APOR (KITS) ×3 IMPLANT
KIT SURGICAL DEVON (SET/KITS/TRAYS/PACK) ×3 IMPLANT
MANIFOLD NEPTUNE II (INSTRUMENTS) ×3 IMPLANT
MARKER SKIN DUAL TIP RULER LAB (MISCELLANEOUS) ×3 IMPLANT
NEEDLE MAYO 6 CRC TAPER PT (NEEDLE) ×3 IMPLANT
NS IRRIG 1000ML POUR BTL (IV SOLUTION) ×3 IMPLANT
PACK MINOR (CUSTOM PROCEDURE TRAY) ×3 IMPLANT
PAD ABD 5X9 TENDERSORB (GAUZE/BANDAGES/DRESSINGS) ×3 IMPLANT
PAD ARMBOARD 7.5X6 YLW CONV (MISCELLANEOUS) ×3 IMPLANT
RASP LG TEAR CROSS CUT (RASP) ×3 IMPLANT
SET BASIN LINEN APH (SET/KITS/TRAYS/PACK) ×3 IMPLANT
SPONGE GAUZE 4X4 12PLY (GAUZE/BANDAGES/DRESSINGS) ×3 IMPLANT
SPONGE LAP 18X18 X RAY DECT (DISPOSABLE) ×3 IMPLANT
STOCKINETTE IMPERVIOUS LG (DRAPES) ×3 IMPLANT
SUT BRALON NAB BRD #1 30IN (SUTURE) ×6 IMPLANT
SUT CHROMIC 0 CTX 36 (SUTURE) ×6 IMPLANT
SUT ETHILON 3 0 FSL (SUTURE) ×3 IMPLANT
SUT PLAIN 2 0 XLH (SUTURE) ×3 IMPLANT
SYR BULB IRRIGATION 50ML (SYRINGE) ×3 IMPLANT
TAPE MEDIFIX FOAM 3 (GAUZE/BANDAGES/DRESSINGS) ×3 IMPLANT
TAPE PAPER 3X10 WHT MICROPORE (GAUZE/BANDAGES/DRESSINGS) ×3 IMPLANT
YANKAUER SUCT 12FT TUBE ARGYLE (SUCTIONS) ×3 IMPLANT

## 2014-06-12 NOTE — Transfer of Care (Signed)
Immediate Anesthesia Transfer of Care Note  Patient: Peter Campbell  Procedure(s) Performed: Procedure(s): OPEN REPAIR ROTATOR CUFF RIGHT  (Right) RIGHT SHOULDER ACROMIOPLASTY (Right)  Patient Location: PACU  Anesthesia Type:General  Level of Consciousness: alert  and patient cooperative  Airway & Oxygen Therapy: Patient Spontanous Breathing and Patient connected to face mask oxygen  Post-op Assessment: Report given to PACU RN, Post -op Vital signs reviewed and stable and Patient moving all extremities  Post vital signs: Reviewed and stable  Complications: No apparent anesthesia complications

## 2014-06-12 NOTE — Anesthesia Procedure Notes (Signed)
Procedure Name: Intubation Date/Time: 06/12/2014 7:46 AM Performed by: Despina Hidden Pre-anesthesia Checklist: Emergency Drugs available, Suction available, Patient being monitored and Patient identified Patient Re-evaluated:Patient Re-evaluated prior to inductionOxygen Delivery Method: Circle system utilized Preoxygenation: Pre-oxygenation with 100% oxygen Intubation Type: IV induction Ventilation: Mask ventilation without difficulty and Oral airway inserted - appropriate to patient size Laryngoscope Size: Mac and 3 Grade View: Grade II Tube type: Oral Tube size: 8.0 mm Number of attempts: 1 Airway Equipment and Method: Stylet and Oral airway Placement Confirmation: ETT inserted through vocal cords under direct vision,  positive ETCO2 and breath sounds checked- equal and bilateral Secured at: 22 cm Tube secured with: Tape Dental Injury: Teeth and Oropharynx as per pre-operative assessment

## 2014-06-12 NOTE — Anesthesia Postprocedure Evaluation (Signed)
  Anesthesia Post-op Note  Patient: Peter Campbell  Procedure(s) Performed: Procedure(s): OPEN REPAIR ROTATOR CUFF RIGHT  (Right) RIGHT SHOULDER ACROMIOPLASTY (Right)  Patient Location: PACU  Anesthesia Type:General  Level of Consciousness: awake, alert , oriented and patient cooperative  Airway and Oxygen Therapy: Patient Spontanous Breathing  Post-op Pain: 4 /10, moderate  Post-op Assessment: Post-op Vital signs reviewed, Patient's Cardiovascular Status Stable, Respiratory Function Stable, Patent Airway, No signs of Nausea or vomiting and Pain level controlled  Post-op Vital Signs: Reviewed and stable  Last Vitals:  Filed Vitals:   06/12/14 0730  BP: 108/75  Temp:   Resp: 23    Complications: No apparent anesthesia complications

## 2014-06-12 NOTE — Anesthesia Preprocedure Evaluation (Signed)
Anesthesia Evaluation  Patient identified by MRN, date of birth, ID band Patient awake    Reviewed: Allergy & Precautions, H&P , NPO status , Patient's Chart, lab work & pertinent test results, reviewed documented beta blocker date and time   Airway Mallampati: II TM Distance: >3 FB     Dental  (+) Poor Dentition, Edentulous Upper, Partial Lower   Pulmonary COPD (am cough)Current Smoker,  breath sounds clear to auscultation        Cardiovascular hypertension, Pt. on medications and Pt. on home beta blockers - angina+ CAD and + Past MI Rhythm:Regular Rate:Bradycardia     Neuro/Psych Chronic LBP  Neuromuscular disease    GI/Hepatic   Endo/Other    Renal/GU      Musculoskeletal   Abdominal   Peds  Hematology   Anesthesia Other Findings   Reproductive/Obstetrics                           Anesthesia Physical Anesthesia Plan  ASA: III  Anesthesia Plan: General   Post-op Pain Management:    Induction: Intravenous  Airway Management Planned: Oral ETT  Additional Equipment:   Intra-op Plan:   Post-operative Plan: Extubation in OR  Informed Consent: I have reviewed the patients History and Physical, chart, labs and discussed the procedure including the risks, benefits and alternatives for the proposed anesthesia with the patient or authorized representative who has indicated his/her understanding and acceptance.     Plan Discussed with:   Anesthesia Plan Comments:         Anesthesia Quick Evaluation

## 2014-06-12 NOTE — Brief Op Note (Signed)
06/12/2014  9:13 AM  PATIENT:  Peter Campbell  58 y.o. male  PRE-OPERATIVE DIAGNOSIS:  Rotator Cuff Tear Right and Impingement  POST-OPERATIVE DIAGNOSIS: same  PROCEDURE:  Procedure(s): OPEN REPAIR ROTATOR CUFF RIGHT  (Right) RIGHT SHOULDER ACROMIOPLASTY (Right)  SURGEON:  Surgeon(s) and Role:    * Darreld Mclean, MD - Primary  PHYSICIAN ASSISTANT:   ASSISTANTS: C. Page, RN   ANESTHESIA:   general  EBL:  Total I/O In: 700 [I.V.:700] Out: -   BLOOD ADMINISTERED:none  DRAINS: none   LOCAL MEDICATIONS USED:  NONE  SPECIMEN:  Source of Specimen:  Right coricoacrominal ligament and shavings of distal clavicle and acromion  DISPOSITION OF SPECIMEN:  PATHOLOGY  COUNTS:  YES  TOURNIQUET:  * No tourniquets in log *  DICTATION: .Other Dictation: Dictation Number [id number]  PLAN OF CARE: Admit for overnight observation  PATIENT DISPOSITION:  PACU - hemodynamically stable.   Delay start of Pharmacological VTE agent (>24hrs) due to surgical blood loss or risk of bleeding: no

## 2014-06-12 NOTE — Progress Notes (Signed)
The History and Physical is unchanged. I have examined the patient. The patient is medically able to have surgery on the right shoulder . Darreld Mclean

## 2014-06-13 ENCOUNTER — Encounter (HOSPITAL_COMMUNITY): Payer: Self-pay | Admitting: Orthopaedic Surgery

## 2014-06-13 LAB — CBC WITH DIFFERENTIAL/PLATELET
Basophils Absolute: 0 10*3/uL (ref 0.0–0.1)
Basophils Relative: 0 % (ref 0–1)
Eosinophils Absolute: 0.1 10*3/uL (ref 0.0–0.7)
Eosinophils Relative: 1 % (ref 0–5)
HCT: 39.7 % (ref 39.0–52.0)
HEMOGLOBIN: 13.1 g/dL (ref 13.0–17.0)
Lymphocytes Relative: 11 % — ABNORMAL LOW (ref 12–46)
Lymphs Abs: 1.7 10*3/uL (ref 0.7–4.0)
MCH: 31.8 pg (ref 26.0–34.0)
MCHC: 33 g/dL (ref 30.0–36.0)
MCV: 96.4 fL (ref 78.0–100.0)
MONOS PCT: 7 % (ref 3–12)
Monocytes Absolute: 1.1 10*3/uL — ABNORMAL HIGH (ref 0.1–1.0)
Neutro Abs: 12.6 10*3/uL — ABNORMAL HIGH (ref 1.7–7.7)
Neutrophils Relative %: 81 % — ABNORMAL HIGH (ref 43–77)
Platelets: 226 10*3/uL (ref 150–400)
RBC: 4.12 MIL/uL — AB (ref 4.22–5.81)
RDW: 12.6 % (ref 11.5–15.5)
WBC: 15.6 10*3/uL — ABNORMAL HIGH (ref 4.0–10.5)

## 2014-06-13 MED ORDER — METOPROLOL TARTRATE 25 MG PO TABS
25.0000 mg | ORAL_TABLET | Freq: Two times a day (BID) | ORAL | Status: DC
  Administered 2014-06-13 – 2014-06-14 (×3): 25 mg via ORAL
  Filled 2014-06-13 (×3): qty 1

## 2014-06-13 MED ORDER — NITROGLYCERIN 0.4 MG SL SUBL
SUBLINGUAL_TABLET | SUBLINGUAL | Status: AC
  Filled 2014-06-13: qty 1

## 2014-06-13 MED ORDER — NITROGLYCERIN 0.4 MG SL SUBL
0.4000 mg | SUBLINGUAL_TABLET | SUBLINGUAL | Status: DC | PRN
  Administered 2014-06-13: 0.4 mg via SUBLINGUAL

## 2014-06-13 MED ORDER — OXYCODONE HCL 5 MG PO TABS
10.0000 mg | ORAL_TABLET | ORAL | Status: DC | PRN
  Administered 2014-06-13 – 2014-06-14 (×9): 10 mg via ORAL
  Filled 2014-06-13 (×9): qty 2

## 2014-06-13 MED ORDER — AMLODIPINE BESYLATE 5 MG PO TABS
5.0000 mg | ORAL_TABLET | Freq: Every day | ORAL | Status: DC
  Administered 2014-06-13 – 2014-06-14 (×2): 5 mg via ORAL
  Filled 2014-06-13 (×2): qty 1

## 2014-06-13 MED ORDER — ALPRAZOLAM 1 MG PO TABS
1.0000 mg | ORAL_TABLET | Freq: Three times a day (TID) | ORAL | Status: DC
  Administered 2014-06-13 – 2014-06-14 (×4): 1 mg via ORAL
  Filled 2014-06-13 (×5): qty 1

## 2014-06-13 MED ORDER — GABAPENTIN 400 MG PO CAPS
400.0000 mg | ORAL_CAPSULE | Freq: Three times a day (TID) | ORAL | Status: DC
  Administered 2014-06-13 – 2014-06-14 (×4): 400 mg via ORAL
  Filled 2014-06-13 (×4): qty 1

## 2014-06-13 MED ORDER — ALBUTEROL SULFATE (2.5 MG/3ML) 0.083% IN NEBU: 2.5000 mg | INHALATION_SOLUTION | Freq: Four times a day (QID) | RESPIRATORY_TRACT | Status: DC | PRN

## 2014-06-13 NOTE — Op Note (Signed)
NAMEJADARIOUS, DOBBINS                  ACCOUNT NO.:  [id number]  MEDICAL RECORD NO.:  [id number]  LOCATION:  A328                          FACILITY:  [facility]  PHYSICIAN:  J. Darreld Mclean, M.D. DATE OF BIRTH:  1956/03/04  DATE OF PROCEDURE: DATE OF DISCHARGE:                              OPERATIVE REPORT   PREOPERATIVE DIAGNOSIS:  Tear of the rotator cuff, supraspinatus area and also impingement of the joint.  POSTOPERATIVE DIAGNOSIS:  Tear of the rotator cuff, supraspinatus area and also impingement of the joint.  PROCEDURE:  Open rotator cuff repair and modified acromioplasty.  ANESTHESIA:  General.  TOURNIQUET:  None.  DRAINS:  None.  SURGEON:  J. Darreld Mclean, M.D.  ASSISTANT:  Natalia Leatherwood Page, RN.  Bronwen Betters BLOOD LOSS:  Minimal.  INDICATIONS:  The patient has had pain and tenderness in his right shoulder for over a year.  An MRI done last November showed a small tear in the supraspinatus tendon.  He underwent physical therapy, medication, home exercise, and did well initially, but he started having more and more pain, more and more tenderness to his shoulder.  He is having difficulty sleeping.  He has not improved with conservative treatment. He had a repeat of the MRI in August of this year and it showed the supraspinatus tear was much smaller, partially healed, but he still had a tear.  Because of his lack of improvement, it causes pain and also because of his findings of impingement on MRI, I recommended consideration of surgery.  The patient asked appropriate questions about the procedure, appears to understand.  I went over the risks and imponderables of the procedure.  He will need physical therapy afterwards, he understands that.  DESCRIPTION OF PROCEDURE:  The patient was seen in the holding area. The right shoulder was identified as correct surgical site.  I placed a mark on the right shoulder.  The patient was brought back to the operating room, placed supine  on the operating room table.  He was given general anesthesia.  He was placed in a so-called semi-barbaric position.  The shoulder was freed for assessment to maneuver during the procedure.  He was then prepped and draped in the usual manner.  A generalized time-out identifying the patient as Mr. Scrima and we were doing a right shoulder for rotator cuff repair.  All instrumentation was properly positioned and working.  OR team knew each other.  He had received antibiotics preoperatively.  Outline for incision was made from the distal acromion to the coracoid area.  Incision was then made with careful dissection, the deltoid was exposed.  So-called weak area in the deltoid was identified and muscle splitting was done with minimal blood loss.  Coracoacromial ligament was readily identified and then removed sharply.  Even though the coracoacromial ligament was removed, he still had a very tight shoulder with obvious intent.  The changes at the [facility] joint in this area was osteophytes.  These were removed using a broad based osteotome and then I used a power rasp to smooth out the area of the distal acromion and this area was also partially removed and smoothed and some modification of the  Neer acromioplasty.  This gave more motion in the shoulder.  He had a small tear of the supraspinatus distally near the insertion to full-thickness tear.  Biceps tendon looked good.  The joint looked good. It was just a small tear and I was able to reapproximate the edges very nicely and I used #2 Surgilon suture.  Shoulder was then placed through a range of motion.  No further impingement and the repair looked good. It should be noticed that after the acromioplasty, the shoulder was irrigated of bone debride.  Chromic suture 2-0 was then used to close the muscle.  Once this was done, subcutaneous was reapproximated with 2- 0 plain and the skin reapproximated using 3-0 subcuticular nylon. Sterile dressing  applied.  Bulky dressing applied to go to recovery in good condition.  He is to be admitted overnight.  A cryo cuff has been ordered.          ______________________________ Shela Commons. Darreld Mclean, M.D.     JWK/MEDQ  D:  06/12/2014  T:  06/12/2014  Job:  [id number]

## 2014-06-13 NOTE — Progress Notes (Signed)
Patient asking about restarting home medications:  Xanax, Norvasc, Neurontin, and Metoprolol.  Dr. Hilda Lias notified.  Gave order to restart the Xanax 1 mg three times a day, Norvasc 5 mg once a day, Neurontin 400 mg three times a day, and Metoprolol 25 mg twice a day.

## 2014-06-13 NOTE — Anesthesia Postprocedure Evaluation (Signed)
  Anesthesia Post-op Note  Patient: Peter Campbell  Procedure(s) Performed: Procedure(s): OPEN REPAIR ROTATOR CUFF RIGHT  (Right) RIGHT SHOULDER ACROMIOPLASTY (Right)  Patient Location: Nursing Unit  Anesthesia Type:General  Level of Consciousness: awake, alert  and oriented  Airway and Oxygen Therapy: Patient Spontanous Breathing and Patient connected to nasal cannula oxygen  Post-op Pain: mild  Post-op Assessment: Post-op Vital signs reviewed, Patient's Cardiovascular Status Stable, Respiratory Function Stable, Patent Airway and No signs of Nausea or vomiting  Post-op Vital Signs: Reviewed and stable  Last Vitals:  Filed Vitals:   06/13/14 0818  BP:   Pulse:   Temp:   Resp: 15    Complications: No apparent anesthesia complications

## 2014-06-13 NOTE — Progress Notes (Signed)
Late entry:  Therapy on unit and notified of patient's order for sling and swathe.

## 2014-06-13 NOTE — Addendum Note (Signed)
Addendum created 06/13/14 2130 by Moshe Salisbury, CRNA   Modules edited: Notes Section   Notes Section:  File: [id number]

## 2014-06-13 NOTE — Progress Notes (Signed)
Late entry:  1331 - OT contacted for sling/swathe placement.  Stated that they were working on getting the sling/swathe ordered.  Stated that an OT order was required.  RN called Dr. Hilda Lias and received an order or OT.  [phone] - RN contacted OT regarding sling/swathe.  Therapy stated that a sling had been ordered for the patient.

## 2014-06-13 NOTE — Progress Notes (Signed)
RN called to patient's room.  Patient c/o of left chest pain/tight/cramping 6 of 10.  Stat 12 lead EKG ordered and RN notified.  Nitro administered per protocol.  Patient already on O2 2 liters [place].  IV fluids as ordered.  Will continue to monitor.

## 2014-06-13 NOTE — Progress Notes (Signed)
Subjective: 1 Day Post-Op Procedure(s) (LRB): OPEN REPAIR ROTATOR CUFF RIGHT  (Right) RIGHT SHOULDER ACROMIOPLASTY (Right) Patient reports pain as 6 on 0-10 scale.    Objective: Vital signs in last 24 hours: Temp:  [97.5 F (36.4 C)-98.5 F (36.9 C)] 98.5 F (36.9 C) (09/16 0551) Pulse Rate:  [52-66] 66 (09/16 0551) Resp:  [10-22] 18 (09/16 0551) BP: (121-138)/(61-77) 132/72 mmHg (09/16 0551) SpO2:  [95 %-100 %] 96 % (09/16 0655) Weight:  [78.019 kg (172 lb)] 78.019 kg (172 lb) (09/15 1146)  Intake/Output from previous day: 09/15 0701 - 09/16 0700 In: 1307 [P.O.:540; I.V.:767] Out: 2850 [Urine:2850] Intake/Output this shift:     Recent Labs  06/13/14 0534  HGB 13.1    Recent Labs  06/13/14 0534  WBC 15.6*  RBC 4.12*  HCT 39.7  PLT 226   No results found for this basename: NA, K, CL, CO2, BUN, CREATININE, GLUCOSE, CALCIUM,  in the last 72 hours No results found for this basename: LABPT, INR,  in the last 72 hours  Neurologically intact Neurovascular intact Sensation intact distally Intact pulses distally  He is still in pain.  I will keep him in the hospital today as he is using the IV PCA pump.  I will change from cryocuff to a sling and swathe.  I will have him begin oral medicine and get up out of bed. Plan discharge tomorrow.  He lives alone.  Assessment/Plan: 1 Day Post-Op Procedure(s) (LRB): OPEN REPAIR ROTATOR CUFF RIGHT  (Right) RIGHT SHOULDER ACROMIOPLASTY (Right) Plan for discharge tomorrow  Demiya Magno 06/13/2014, 7:40 AM

## 2014-06-13 NOTE — Progress Notes (Addendum)
1850 - Dr. Hilda Lias paged for EKG results (NS).  Awaiting return call.  Patient states chest pain resolved. 1940 approx.  Dr. Hilda Lias repaged.  Patient states chest pain resolved.

## 2014-06-13 NOTE — Care Management Utilization Note (Signed)
UR completed 

## 2014-06-14 LAB — CBC WITH DIFFERENTIAL/PLATELET
BASOS ABS: 0 10*3/uL (ref 0.0–0.1)
Basophils Relative: 0 % (ref 0–1)
Eosinophils Absolute: 0.4 10*3/uL (ref 0.0–0.7)
Eosinophils Relative: 4 % (ref 0–5)
HEMATOCRIT: 38.8 % — AB (ref 39.0–52.0)
Hemoglobin: 12.9 g/dL — ABNORMAL LOW (ref 13.0–17.0)
LYMPHS PCT: 19 % (ref 12–46)
Lymphs Abs: 1.9 10*3/uL (ref 0.7–4.0)
MCH: 31.7 pg (ref 26.0–34.0)
MCHC: 33.2 g/dL (ref 30.0–36.0)
MCV: 95.3 fL (ref 78.0–100.0)
Monocytes Absolute: 0.9 10*3/uL (ref 0.1–1.0)
Monocytes Relative: 9 % (ref 3–12)
NEUTROS PCT: 68 % (ref 43–77)
Neutro Abs: 6.5 10*3/uL (ref 1.7–7.7)
PLATELETS: 210 10*3/uL (ref 150–400)
RBC: 4.07 MIL/uL — ABNORMAL LOW (ref 4.22–5.81)
RDW: 13 % (ref 11.5–15.5)
WBC: 9.7 10*3/uL (ref 4.0–10.5)

## 2014-06-14 MED ORDER — OXYCODONE HCL ER 20 MG PO T12A: 20.0000 mg | EXTENDED_RELEASE_TABLET | Freq: Two times a day (BID) | ORAL | Status: DC

## 2014-06-14 MED ORDER — ONDANSETRON HCL 4 MG/2ML IJ SOLN
4.0000 mg | Freq: Once | INTRAMUSCULAR | Status: DC
  Filled 2014-06-14: qty 2

## 2014-06-14 NOTE — Progress Notes (Signed)
Patient c/o nausea.  Nausea medication had been discontinued.  Dr. Hilda Lias paged.  Returned page and gave order for one time dose of Zofran 4 mg IV.

## 2014-06-14 NOTE — Progress Notes (Signed)
Dr. Hilda Lias returned page.  RN notified Dr. Hilda Lias that patient c/o pain at "greater than 10" and shaking all over.  Informed Dr. Hilda Lias that patient had just received his PRN pain medication and that the Fentanyl PCA had been discontinued.  RN asked Dr. Hilda Lias if patient could have something else for pain.  Dr. Hilda Lias stated, "No, send him home."  RN asked if patient could have something longer acting to cover his ride him.  Dr. Hilda Lias stated, "No, he can go home and fill his prescription."

## 2014-06-14 NOTE — Progress Notes (Signed)
Subjective: 2 Days Post-Op Procedure(s) (LRB): OPEN REPAIR ROTATOR CUFF RIGHT  (Right) RIGHT SHOULDER ACROMIOPLASTY (Right) Patient reports pain as 4 on 0-10 scale.    Objective: Vital signs in last 24 hours: Temp:  [98 F (36.7 C)-98.4 F (36.9 C)] 98.3 F (36.8 C) (09/17 0441) Pulse Rate:  [60-67] 64 (09/17 0441) Resp:  [12-20] 16 (09/17 0441) BP: (112-139)/(61-73) 117/63 mmHg (09/17 0441) SpO2:  [90 %-98 %] 92 % (09/17 0441)  Intake/Output from previous day: 09/16 0701 - 09/17 0700 In: 2235 [P.O.:1680; I.V.:555] Out: 4430 [Urine:4430] Intake/Output this shift:     Recent Labs  06/13/14 0534 06/14/14 0542  HGB 13.1 12.9*    Recent Labs  06/13/14 0534 06/14/14 0542  WBC 15.6* 9.7  RBC 4.12* 4.07*  HCT 39.7 38.8*  PLT 226 210   No results found for this basename: NA, K, CL, CO2, BUN, CREATININE, GLUCOSE, CALCIUM,  in the last 72 hours No results found for this basename: LABPT, INR,  in the last 72 hours  Neurologically intact Neurovascular intact Sensation intact distally Intact pulses distally Incision: no drainage No cellulitis present Compartment soft  His dressing had not been changed and he had not been out of bed.  Wound looks good.    Assessment/Plan: 2 Days Post-Op Procedure(s) (LRB): OPEN REPAIR ROTATOR CUFF RIGHT  (Right) RIGHT SHOULDER ACROMIOPLASTY (Right) Discharge Home. To be seen in my office on September 29th.  Joyel Chenette 06/14/2014, 7:24 AM

## 2014-06-14 NOTE — Progress Notes (Signed)
Patient's IV removed.  Site WNL.  Patient educated on applying cryo cuff and sling by OT.  Re-educated by RN.  Patient reports pain much improved at 6 of 10.  Patient verbalized understanding of discharge instructions, physician follow-up, medications, and incision care.  Patient stated that he would buy a thermometer in order to monitor his temperature as needed.  Stated that he has a friend and a neighbor that can assist with the sling and the cryo cuff.  Patient stable at time of discharge.  Assessment unchanged from morning assessment.

## 2014-06-14 NOTE — Discharge Instructions (Signed)
Keep shoulder wound dry.  Use the cryocuff as needed or use ice.  Use the sling and swathe.  Sleep semi-erect as in a lazy boy chair positioning.  Keep appointment with Dr. Hilda Lias on September 29th.  Call his office at [phone] if any problem or if after hours, call the hospital at [phone].

## 2014-06-14 NOTE — Progress Notes (Signed)
Dr. Hilda Lias returned page and informed of Patient having chest pain that resolved after Nitro given. EKG showed Normal Sinus Rhythm .

## 2014-06-14 NOTE — Progress Notes (Signed)
Patient c/o pain at "greater than a 10".  States that the pain was better when Dr. Hilda Lias came to see him earlier this morning.  States that the "pain pill had helped earlier".  Last received at 0607 according to [facility].  Patient to be discharged home this morning.  Dr. Hilda Lias paged.

## 2014-06-14 NOTE — Evaluation (Addendum)
Occupational Therapy Evaluation Patient Details Name: EYDEN DOBIE MRN: [id number] DOB: 22-Sep-1956 Today's Date: 06/14/2014    History of Present Illness He has a year long history of right shoulder pain.  MRI in December showed a small full thickness tear of the supraspinatus.  He has had therapy and done home exercises. He has taken medication.  He still has pain in the right shoulder. New MRI done done 05/23/14 shows the tear, although smaller.  He continues to have pain, inability to sleep well.  Surgery is now recommended as he has failed conservative treatments to date. He also has signs of impingement.  Pt underwent Right RCR.   Clinical Impression   Pt presenting to acute OT with above situation.  Educated pt on sling wear and care.  Pt demonstrated good skills with donning sling and verbalized understanding of process.  Provided handout for pt reference.  Discussed dressing and bathing skills - pt verbalized understanding.  Pt will have PRN assist in the evenings with ADL/IADL tasks.  Educated pt on importance of therapy, and that MD will recommend further therapy as shoulder heals.  Pt verbalized understanding.  Pt needs no further acute OT, but recommend outpatient OT per MD orders.    Follow Up Recommendations  Outpatient OT    Equipment Recommendations  None recommended by OT    Recommendations for Other Services       Precautions / Restrictions Precautions Precautions: Shoulder Shoulder Interventions: Shoulder sling/immobilizer;At all times Precaution Booklet Issued: No Required Braces or Orthoses: Sling Restrictions Weight Bearing Restrictions: Yes RUE Weight Bearing: Non weight bearing      Mobility Bed Mobility Overal bed mobility: Independent                Transfers Overall transfer level: Independent                    Balance Overall balance assessment: No apparent balance deficits (not formally assessed)                                          ADL                                         General ADL Comments: Pt will required assist with all bilateral tasks at this time.     Vision                     Perception     Praxis      Pertinent Vitals/Pain Pain Assessment: 0-10 Pain Score: 8  Pain Location: right shoulder.  pain decreased to 6/10 during therapy session. Pain Descriptors / Indicators: Constant Pain Intervention(s): Repositioned;Monitored during session;Premedicated before session;Utilized relaxation techniques     Hand Dominance Right   Extremity/Trunk Assessment Upper Extremity Assessment Upper Extremity Assessment: RUE deficits/detail RUE Deficits / Details: R shoulder immobilized due to recent surgery.  Wrist/hand appear WFL. RUE: Unable to fully assess due to immobilization           Communication Communication Communication: No difficulties   Cognition Arousal/Alertness: Awake/alert Behavior During Therapy: WFL for tasks assessed/performed Overall Cognitive Status: Within Functional Limits for tasks assessed  General Comments       Exercises       Shoulder Instructions      Home Living Family/patient expects to be discharged to:: Private residence   Available Help at Discharge: Friend(s);Available PRN/intermittently Type of Home: Mobile home Home Access: Stairs to enter Entrance Stairs-Number of Steps: 5 Entrance Stairs-Rails: Can reach both Home Layout: One level     Bathroom Shower/Tub: Chief Strategy Officer: Standard     Home Equipment: None          Prior Functioning/Environment Level of Independence: Independent             OT Diagnosis:     OT Problem List:     OT Treatment/Interventions:      OT Goals(Current goals can be found in the care plan section) Acute Rehab OT Goals Patient Stated Goal: indepednence.  Play guitar. OT Goal Formulation: With patient  OT  Frequency:     Barriers to D/C:            Co-evaluation              End of Session    Activity Tolerance: Patient tolerated treatment well Patient left: in chair;with call bell/phone within reach   Time: 0939-1007 OT Time Calculation (min): 28 min Charges:  OT General Charges $OT Visit: 1 Procedure OT Evaluation $Initial OT Evaluation Tier I: 1 Procedure OT Treatments $Self Care/Home Management : 8-22 mins G-Codes: OT G-codes **NOT FOR INPATIENT CLASS** Functional Assessment Tool Used: Clinical Judgement Functional Limitation: Self care Self Care Current Status (Z6109): At least 40 percent but less than 60 percent impaired, limited or restricted Self Care Goal Status (U0454): At least 20 percent but less than 40 percent impaired, limited or restricted  Marry Guan, MS, OTR/L [phone]  06/14/2014, 12:42 PM

## 2014-06-14 NOTE — Progress Notes (Addendum)
Late entry 06/13/14:  Patient did not want to walk in the morning.  Stated he did not want to because his shoulder was hurting.  Patient up to chair this afternoon and for evening meal.  Patient requested to return to chair and to have cryocuff put back on after evening meal.

## 2014-06-14 NOTE — Discharge Summary (Signed)
Physician Discharge Summary  Patient ID: Peter Campbell MRN: [id number] DOB/AGE: 57-Nov-1957 57 y.o.  Admit date: 06/12/2014 Discharge date: 06/14/2014  Admission Diagnoses: Rotator cuff tear right shoulder and impingement syndrome  Discharge Diagnoses:  Active Problems:   Rotator cuff impingement syndrome of right shoulder   Discharged Condition: good  Hospital Course: He was admitted from Day Surgery after having surgery on the right shoulder.  He has remained afebrile.  He has been on IV Dilaudid and oral oxycodone.  His vitals have remained normal.  He used a cryocuff and a sling and swathe.  His wound looks good at time of discharge.  He has been told instructions in care of the shoulder.  Consults: None  Significant Diagnostic Studies: labs: normal  Treatments: analgesia: Dilaudid, anticoagulation: enoxaparin and surgery: Rotator cuff repair and acromioplasty right shoulder  Discharge Exam: Blood pressure 117/63, pulse 64, temperature 98.3 F (36.8 C), temperature source Oral, resp. rate 16, height  (1.727 m), weight 78.019 kg (172 lb), SpO2 92.00%. General appearance: alert and cooperative.  Right shoulder wound looks good.  NV is intact.  Disposition: 01-Home or Self Care     Medication List         ALPRAZolam 1 MG tablet  Commonly known as:  XANAX  Take 1 mg by mouth 3 (three) times daily.     amLODipine 5 MG tablet  Commonly known as:  NORVASC  Take 5 mg by mouth daily.     aspirin EC 81 MG tablet  Take 1 tablet (81 mg total) by mouth daily.     gabapentin 400 MG capsule  Commonly known as:  NEURONTIN  Take 400 mg by mouth 3 (three) times daily.     HYDROcodone-acetaminophen 7.5-325 MG per tablet  Commonly known as:  NORCO  Take 1 tablet by mouth every 6 (six) hours as needed for moderate pain.     metoprolol succinate 25 MG 24 hr tablet  Commonly known as:  TOPROL-XL  Take 25 mg by mouth 2 (two) times daily.     NAPROXEN PO  Take 500 mg by mouth  daily.     nitroGLYCERIN 0.4 MG SL tablet  Commonly known as:  NITROSTAT  Place 1 tablet (0.4 mg total) under the tongue every 5 (five) minutes as needed for chest pain.     OxyCODONE 20 mg T12a 12 hr tablet  Commonly known as:  OXYCONTIN  Take 1 tablet (20 mg total) by mouth every 12 (twelve) hours.     temazepam 30 MG capsule  Commonly known as:  RESTORIL  Take 30 mg by mouth at bedtime as needed for sleep.           Follow-up Information   Follow up with Darreld Mclean, MD In 2 weeks.   Specialty:  Orthopedic Surgery   Contact information:   [address] [place] [address] [phone]       Signed: Darreld Mclean 06/14/2014, 7:32 AM

## 2014-07-02 ENCOUNTER — Ambulatory Visit (HOSPITAL_COMMUNITY)
Admission: RE | Admit: 2014-07-02 | Discharge: 2014-07-02 | Disposition: A | Discharge status: Home / Self Care | Payer: Medicare Other | Source: Ambulatory Visit | Attending: Orthopaedic Surgery | Admitting: Orthopaedic Surgery

## 2014-07-02 DIAGNOSIS — M25511 Pain in right shoulder: Secondary | ICD-10-CM | POA: Insufficient documentation

## 2014-07-02 DIAGNOSIS — M6281 Muscle weakness (generalized): Secondary | ICD-10-CM | POA: Insufficient documentation

## 2014-07-02 DIAGNOSIS — Z4789 Encounter for other orthopedic aftercare: Secondary | ICD-10-CM | POA: Diagnosis present

## 2014-07-02 DIAGNOSIS — M25611 Stiffness of right shoulder, not elsewhere classified: Secondary | ICD-10-CM | POA: Insufficient documentation

## 2014-07-02 NOTE — Evaluation (Addendum)
Occupational Therapy Evaluation  Patient Details  Name: Peter Campbell MRN: [id number] Date of Birth: 12/11/1955  Today's Date: 07/02/2014 Time: 4098-11911443-1515 OT Time Calculation (min): 32 min OT eval: 4782-95621443-1515 32'  Visit#: 1 of 24  Re-eval: 07/30/14  Assessment Diagnosis: s/p right rotator cuff repair Surgical Date: 06/12/14 Next MD Visit: 07/10/14 - Peter Campbell  Authorization: Medicare Part A primary, Medicaid secondary  Authorization Time Period: Before 10th visit  Authorization Visit#: 1 of 10   Past Medical History:  Past Medical History  Diagnosis Date  . Essential hypertension, benign   . Chronic back pain   . Essential tremor   . Coronary atherosclerosis of native coronary artery     Dense  coronary atherosclerosis by chest CT November 2014   . History of chicken pox   . History of [place]   . Sciatica   . Collagen vascular disease   . Asthma    Past Surgical History:  Past Surgical History  Procedure Laterality Date  . Colon surgery    . Abdominal surgery    . Tonsillectomy    . Circumcision    . Shoulder open rotator cuff repair Right 06/12/2014    Procedure: OPEN REPAIR ROTATOR CUFF RIGHT ;  Surgeon: Darreld McleanWayne Keeling, MD;  Location: [facility];  Service: Orthopedics;  Laterality: Right;  . Shoulder acromioplasty Right 06/12/2014    Procedure: RIGHT SHOULDER ACROMIOPLASTY;  Surgeon: Darreld McleanWayne Keeling, MD;  Location: [facility];  Service: Orthopedics;  Laterality: Right;    Subjective Symptoms/Limitations Symptoms: S: The doctor said I can start weaning from the sling.  Pertinent History: [name] has had a long history of right shoulder pain. Home exercises and medication did not help. MRI on 05/22/14 showed a full thickness supraspinantus tear. [name] underwent a Right RCR on 06/12/14. Dr. Hilda Campbell has referred patient to occupational therapy for evaluation and treatment.  Special Tests: FOTO Score: 21/100 Patient Stated Goals: To be able to use arm as normal  Pain  Assessment Currently in Pain?: Yes Pain Score: 5  Pain Location: Shoulder Pain Orientation: Right Pain Type: Surgical pain  Precautions/Restrictions  Precautions Precautions: Shoulder Required Braces or Orthoses: Sling   PROM for 4 weeks (07/30/14) AAROM for 4 weeks (07/31/14) then progress as tolerated.   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 IR/ER Adducted) RUE PROM (degrees) Right Shoulder Flexion: 71 Degrees Right Shoulder ABduction: 71 Degrees Right Shoulder Internal Rotation: -10 Degrees Right Shoulder External Rotation: 86 Degrees RUE Strength RUE Overall Strength Comments: Strength not assessed this date. Palpation Palpation: Max fascial restrictions in right upper arm, trapezius, and scapularis region.       Occupational Therapy Assessment and Plan OT Assessment and Plan Clinical Impression Statement: A: Pt is a 58 y/o male s/p right rotator cuff repair causing increased pain, increased fascial restrictions, decreased ROM, and decreased strength, resulting in difficulty completing daily activities.  Pt will benefit from skilled therapeutic intervention in order to improve on the following  deficits: Impaired UE functional use;Increased fascial restricitons;Decreased range of motion;Impaired flexibility;Decreased strength;Pain Rehab Potential: Excellent OT Frequency: Min 2X/week OT Duration: 12 weeks OT Treatment/Interventions: Self-care/ADL training;Therapeutic activities;Therapeutic exercise;Patient/family education;Manual therapy;Modalities OT Plan: P: Pt will benefit from skilled OT interventions in order to decrease pain, increase ROM, increase strength, and increase overall RUE functional use. Treatment Plan: PROM, AAROM, and AROM, MFR and manual stretching, scapular strengthening and proximal stabilization, RUE general strengthening     Goals Short Term Goals Time to Complete Short Term Goals: 6 weeks Short Term Goal 1: Patient will be educated on HEP.  Short Term Goal 2: Patient will decrease pain to 3/10 during daily tasks.  Short Term Goal 3: Patient will decrease fascial restrictions from max to mod fascial restrictions.  Short Term Goal 4: Patient will increase AROM to [facility] to increase ability to donn upper body clothing.  Short Term Goal 5: Patient will increase strength to 3/5 to increase ability to operate manual gear shift.  Long Term Goals Time to Complete Long Term Goals: 12 weeks Long Term Goal 1: Patient will regain highest level of functioning and independence in all daily tasks.  Long Term Goal 2: Patient will decrease pain to 1/10 or less during daily tasks.  Long Term Goal 3: Patient will decrease fascial restrictions from mod to min or less.  Long Term Goal 4: Patient will increase AROM to WNL to increase ability to reach onto overhead shelves.  Long Term Goal 5: Patient will increase strength to 4/5 to increase ability to play musical instruments.   Problem List Patient Active Problem List   Diagnosis Date Noted  . Muscle weakness (generalized) 07/02/2014  . Decreased range of motion of right shoulder 07/02/2014  . Rotator cuff impingement syndrome  of right shoulder 06/12/2014  . Coronary atherosclerosis of native coronary artery 09/26/2013  . Tobacco abuse 09/26/2013  . Essential hypertension, benign 09/25/2013    End of Session Activity Tolerance: Patient tolerated treatment well General Behavior During Therapy: [facility] for tasks assessed/performed OT Plan of Care OT Home Exercise Plan: towel slides OT Patient Instructions: handout (scanned) Consulted and Agree with Plan of Care: Patient  GO Functional Assessment Tool Used: FOTO score: 21/100 (79% impairment) Functional Limitation: Carrying, moving and handling objects Carrying, Moving and Handling Objects Current Status (U0454): At least 60 percent but less than 80 percent impaired, limited or restricted Carrying, Moving and Handling Objects Goal Status [phone]): At least 20 percent but less than 40 percent impaired, limited or restricted  Limmie Patricia, OTR/L,CBIS   07/02/2014, 5:39 PM  Physician Documentation Your signature is required to indicate approval of the treatment plan as stated above.  Please sign and either send electronically or make a copy of this report for your files and return this physician signed original.  Please mark one 1.__approve of plan  2. ___approve of plan with the following conditions.   ______________________________                                                          _____________________ Physician Signature  Date  

## 2014-07-04 ENCOUNTER — Ambulatory Visit (HOSPITAL_COMMUNITY)
Admission: RE | Admit: 2014-07-04 | Discharge: 2014-07-04 | Disposition: A | Discharge status: Home / Self Care | Payer: Medicare Other | Source: Ambulatory Visit | Attending: Orthopaedic Surgery | Admitting: Orthopaedic Surgery

## 2014-07-04 DIAGNOSIS — Z4789 Encounter for other orthopedic aftercare: Secondary | ICD-10-CM | POA: Diagnosis not present

## 2014-07-04 NOTE — Progress Notes (Signed)
Occupational Therapy Treatment Patient Details  Name: Peter Campbell MRN: [id number] Date of Birth: 05/16/1956  Today's Date: 07/04/2014 Time: 4098-11911437-1515 OT Time Calculation (min): 38 min MFR: [id number] 13' Therex: 1308-65781450-1515 25'  Visit#: 2 of 24  Re-eval: 07/30/14    Authorization: Medicare Part A primary, Medicaid secondary  Authorization Time Period: Before 10th visit  Authorization Visit#: 2 of 10  Subjective Symptoms/Limitations Symptoms: S: My arm is feeling some better. I haven't done my exercises because the towel wouldn't slide on the table I bought so I have to get another one.  Pain Assessment Currently in Pain?: Yes Pain Score: 6  Pain Location: Shoulder Pain Orientation: Right Pain Type: Acute pain  Precautions/Restrictions  Precautions Precautions: Shoulder Type of Shoulder Precautions: PROM for 4 weeks (07/30/14) AAROM for 4 weeks (07/31/14) then progress as tolerated.   Exercise/Treatments Supine Protraction: PROM;10 reps Horizontal ABduction: PROM;10 reps External Rotation: PROM;10 reps Internal Rotation: PROM;10 reps Flexion: PROM;10 reps ABduction: PROM;10 reps Other Supine Exercises: Bridges 15X Seated Elevation: AROM;15 reps Extension: AROM;15 reps Row: AROM;15 reps   Therapy Ball Flexion: 15 reps ABduction: 15 reps    Manual Therapy Manual Therapy: Myofascial release Myofascial Release: Myofascial release to right shoulder, upper arm, trapezius and scapularis region to decrease fascial restrictions and increase joint mobility in a pain free zone.   Occupational Therapy Assessment and Plan OT Assessment and Plan Clinical Impression Statement: A: Initiated PROM exercises and manual stretching. Patient tolerated well. Patient reports he has not completed the towel slide exercises yet because he has to get a table his towel will slide on.  OT Plan: P: Continue PROM exercises. Attempt isometric exercises.    Goals Short Term Goals Time to  Complete Short Term Goals: 6 weeks Short Term Goal 1: Patient will be educated on HEP.  Short Term Goal 1 Progress: Progressing toward goal Short Term Goal 2: Patient will decrease pain to 3/10 during daily tasks.  Short Term Goal 2 Progress: Progressing toward goal Short Term Goal 3: Patient will decrease fascial restrictions from max to mod fascial restrictions.  Short Term Goal 3 Progress: Progressing toward goal Short Term Goal 4: Patient will increase AROM to [facility] to increase ability to donn upper body clothing.  Short Term Goal 4 Progress: Progressing toward goal Short Term Goal 5: Patient will increase strength to 3/5 to increase ability to operate manual gear shift.  Short Term Goal 5 Progress: Progressing toward goal Long Term Goals Time to Complete Long Term Goals: 12 weeks Long Term Goal 1: Patient will regain highest level of functioning and independence in all daily tasks.  Long Term Goal 1 Progress: Progressing toward goal Long Term Goal 2: Patient will decrease pain to 1/10 or less during daily tasks.  Long Term Goal 2 Progress: Progressing toward goal Long Term Goal 3: Patient will decrease fascial restrictions from mod to min or less.  Long Term Goal 3 Progress: Progressing toward goal Long Term Goal 4: Patient will increase AROM to WNL to increase ability to reach onto overhead shelves.  Long Term Goal 4 Progress: Progressing toward goal Long Term Goal 5: Patient will increase strength to 4/5 to increase ability to play musical instruments.  Long Term Goal 5 Progress: Progressing toward goal  Problem List Patient Active Problem List   Diagnosis Date Noted  . Muscle weakness (generalized) 07/02/2014  . Decreased range of motion of right shoulder 07/02/2014  . Rotator cuff impingement syndrome of right shoulder 06/12/2014  . Coronary atherosclerosis  of native coronary artery 09/26/2013  . Tobacco abuse 09/26/2013  . Essential hypertension, benign 09/25/2013    End of  Session Activity Tolerance: Patient tolerated treatment well General Behavior During Therapy: [facility] for tasks assessed/performed   Limmie Patricia, OTR/L,CBIS   07/04/2014, 4:24 PM

## 2014-07-09 ENCOUNTER — Ambulatory Visit (HOSPITAL_COMMUNITY)
Admission: RE | Admit: 2014-07-09 | Discharge: 2014-07-09 | Disposition: A | Discharge status: Home / Self Care | Payer: Medicare Other | Source: Ambulatory Visit | Attending: Orthopaedic Surgery | Admitting: Orthopaedic Surgery

## 2014-07-09 DIAGNOSIS — Z4789 Encounter for other orthopedic aftercare: Secondary | ICD-10-CM | POA: Diagnosis not present

## 2014-07-09 NOTE — Progress Notes (Signed)
Occupational Therapy Treatment Patient Details  Name: Peter Campbell MRN: [id number] Date of Birth: 1955-12-10  Today's Date: 07/09/2014 Time: 1440-1520 OT Time Calculation (min): 40 min Manual 1440-1505 (25') Therapeutic Exercises 1505-1520 (15')  Visit#: 3 of 24  Re-eval: 07/30/14    Authorization: Medicare Part A primary, Medicaid secondary  Authorization Time Period: Before 10th visit  Authorization Visit#: 3 of 10  Subjective Symptoms/Limitations Symptoms: "Its about a 5 or a 6... unless I decide to move it the wrong way." Pain Assessment Currently in Pain?: Yes Pain Score: 5   Exercise/Treatments Supine Protraction: PROM;10 reps Horizontal ABduction: PROM;10 reps External Rotation: PROM;10 reps Internal Rotation: PROM;10 reps Flexion: PROM;10 reps ABduction: PROM;10 reps Other Supine Exercises: Bridges 15X Seated Elevation: AROM;15 reps Extension: AROM;15 reps Row: AROM;15 reps Therapy Ball Flexion: 15 reps ABduction: 15 reps ROM / Strengthening / Isometric Strengthening   Flexion: 3X3" Extension: 3X3" External Rotation: 3X3" Internal Rotation: 3X3" ABduction: 3X3" ADduction: 3X3" Manual Therapy Manual Therapy: Myofascial release Myofascial Release: Myofascial release to right shoulder, upper arm, trapezius and scapularis region to decrease fascial restrictions and increase joint mobility in a pain free zone.    Occupational Therapy Assessment and Plan OT Assessment and Plan Clinical Impression Statement: Continued PROM and manual stretching.  Added isometrics this session, and pt tolerated well. Continued ball stretches in sitting with good tolerance.    pt indicated he has not yet completed towel slides, as his table will not allow sliding and his countertop is too low. OT Plan: Add pro/ret/elev/dep   Goals Short Term Goals Short Term Goal 1: Patient will be educated on HEP.  Short Term Goal 1 Progress: Progressing toward goal Short Term Goal 2:  Patient will decrease pain to 3/10 during daily tasks.  Short Term Goal 2 Progress: Progressing toward goal Short Term Goal 3: Patient will decrease fascial restrictions from max to mod fascial restrictions.  Short Term Goal 3 Progress: Progressing toward goal Short Term Goal 4: Patient will increase AROM to [facility] to increase ability to donn upper body clothing.  Short Term Goal 4 Progress: Progressing toward goal Short Term Goal 5: Patient will increase strength to 3/5 to increase ability to operate manual gear shift.  Short Term Goal 5 Progress: Progressing toward goal Long Term Goals Long Term Goal 1: Patient will regain highest level of functioning and independence in all daily tasks.  Long Term Goal 1 Progress: Progressing toward goal Long Term Goal 2: Patient will decrease pain to 1/10 or less during daily tasks.  Long Term Goal 2 Progress: Progressing toward goal Long Term Goal 3: Patient will decrease fascial restrictions from mod to min or less.  Long Term Goal 3 Progress: Progressing toward goal Long Term Goal 4: Patient will increase AROM to WNL to increase ability to reach onto overhead shelves.  Long Term Goal 4 Progress: Progressing toward goal Long Term Goal 5: Patient will increase strength to 4/5 to increase ability to play musical instruments.  Long Term Goal 5 Progress: Progressing toward goal  Problem List Patient Active Problem List   Diagnosis Date Noted  . Muscle weakness (generalized) 07/02/2014  . Decreased range of motion of right shoulder 07/02/2014  . Rotator cuff impingement syndrome of right shoulder 06/12/2014  . Coronary atherosclerosis of native coronary artery 09/26/2013  . Tobacco abuse 09/26/2013  . Essential hypertension, benign 09/25/2013    End of Session Activity Tolerance: Patient tolerated treatment well General Behavior During Therapy: [facility] for tasks assessed/performed  GO  [name], MS, OTR/L [place] [phone] 07/09/2014, 4:23 PM

## 2014-07-11 ENCOUNTER — Ambulatory Visit (HOSPITAL_COMMUNITY)
Admission: RE | Admit: 2014-07-11 | Discharge: 2014-07-11 | Disposition: A | Discharge status: Home / Self Care | Payer: Medicare Other | Source: Ambulatory Visit

## 2014-07-11 DIAGNOSIS — Z4789 Encounter for other orthopedic aftercare: Secondary | ICD-10-CM | POA: Diagnosis not present

## 2014-07-11 NOTE — Progress Notes (Signed)
Occupational Therapy Treatment Patient Details  Name: Peter Campbell MRN: [id number] Date of Birth: 08-Nov-1955  Today's Date: 07/11/2014 Time: 1440-1520 OT Time Calculation (min): 40 min Manual 1440-1503 (23') Therapeutic Exercises 1503-1520 (17')  Visit#: 4 of 24  Re-eval: 07/30/14    Authorization: Medicare Part A primary, Medicaid secondary  Authorization Time Period: Before 10th visit  Authorization Visit#: 4 of 10  Subjective Symptoms/Limitations Symptoms: "i had a rough night. My shoudler hurt, but I just couldn't sleep good." Pain Assessment Currently in Pain?: Yes Pain Score: 5  Pain Location: Shoulder Pain Orientation: Right Pain Type: Acute pain  Exercise/Treatments Supine Protraction: PROM;10 reps Horizontal ABduction: PROM;10 reps External Rotation: PROM;10 reps Internal Rotation: PROM;10 reps Flexion: PROM;10 reps ABduction: PROM;10 reps Other Supine Exercises: Bridges 20x Seated Elevation: AROM;15 reps Extension: AROM;15 reps Row: AROM;15 reps ROM / Strengthening / Isometric Strengthening   Flexion: 3X5" Extension: 3X5" External Rotation: 3X5" Internal Rotation: 3X5" ABduction: 3X5" ADduction: 3X5"    Manual Therapy Manual Therapy: Myofascial release Myofascial Release: Myofascial release to right shoulder, upper arm, trapezius and scapularis region to decrease fascial restrictions and increase joint mobility in a pain free zone.    Occupational Therapy Assessment and Plan OT Assessment and Plan Clinical Impression Statement: continued MFR and PROM this session, with good tolerance.  Increased isometric hold time with good tolerance.  Pt states he is taking fewer pain meds than after initial surgery, and pain levels are managable.  Did not add pro/ret/elev/dep due to timing.   OT Plan: Add pro/ret/elev/dep   Goals Short Term Goals Short Term Goal 1: Patient will be educated on HEP.  Short Term Goal 1 Progress: Progressing toward goal Short  Term Goal 2: Patient will decrease pain to 3/10 during daily tasks.  Short Term Goal 2 Progress: Progressing toward goal Short Term Goal 3: Patient will decrease fascial restrictions from max to mod fascial restrictions.  Short Term Goal 3 Progress: Progressing toward goal Short Term Goal 4: Patient will increase AROM to [facility] to increase ability to donn upper body clothing.  Short Term Goal 4 Progress: Progressing toward goal Short Term Goal 5: Patient will increase strength to 3/5 to increase ability to operate manual gear shift.  Short Term Goal 5 Progress: Progressing toward goal Long Term Goals Long Term Goal 1: Patient will regain highest level of functioning and independence in all daily tasks.  Long Term Goal 1 Progress: Progressing toward goal Long Term Goal 2: Patient will decrease pain to 1/10 or less during daily tasks.  Long Term Goal 2 Progress: Progressing toward goal Long Term Goal 3: Patient will decrease fascial restrictions from mod to min or less.  Long Term Goal 3 Progress: Progressing toward goal Long Term Goal 4: Patient will increase AROM to WNL to increase ability to reach onto overhead shelves.  Long Term Goal 4 Progress: Progressing toward goal Long Term Goal 5: Patient will increase strength to 4/5 to increase ability to play musical instruments.  Long Term Goal 5 Progress: Progressing toward goal  Problem List Patient Active Problem List   Diagnosis Date Noted  . Muscle weakness (generalized) 07/02/2014  . Decreased range of motion of right shoulder 07/02/2014  . Rotator cuff impingement syndrome of right shoulder 06/12/2014  . Coronary atherosclerosis of native coronary artery 09/26/2013  . Tobacco abuse 09/26/2013  . Essential hypertension, benign 09/25/2013    End of Session Activity Tolerance: Patient tolerated treatment well General Behavior During Therapy: [facility] for tasks assessed/performed  GO  Bea Graff Sayer Masini, MS, OTR/L [place] [phone] 07/11/2014, 3:39 PM

## 2014-07-16 ENCOUNTER — Telehealth (HOSPITAL_COMMUNITY): Payer: Self-pay

## 2014-07-16 ENCOUNTER — Inpatient Hospital Stay (HOSPITAL_COMMUNITY): Admission: RE | Admit: 2014-07-16 | Payer: Medicare Other | Source: Ambulatory Visit

## 2014-07-16 NOTE — Telephone Encounter (Signed)
cx - no transportation

## 2014-07-18 ENCOUNTER — Ambulatory Visit (HOSPITAL_COMMUNITY)
Admission: RE | Admit: 2014-07-18 | Discharge: 2014-07-18 | Disposition: A | Discharge status: Home / Self Care | Payer: Medicare Other | Source: Ambulatory Visit | Attending: Orthopaedic Surgery | Admitting: Orthopaedic Surgery

## 2014-07-18 DIAGNOSIS — Z4789 Encounter for other orthopedic aftercare: Secondary | ICD-10-CM | POA: Diagnosis not present

## 2014-07-18 NOTE — Progress Notes (Signed)
Occupational Therapy Treatment Patient Details  Name: Peter Campbell MRN: [id number] Date of Birth: 05/31/1956  Today's Date: 07/18/2014 Time: 9024-0973 OT Time Calculation (min): 39 min Manual 5329-9242 (23') Therapeutic Exercises [phone] (16')  Visit#: 5 of 24  Re-eval: 07/30/14    Authorization: Medicare Part A primary, Medicaid secondary  Authorization Time Period: Before 10th visit  Authorization Visit#: 5 of 10  Subjective Symptoms/Limitations Symptoms: "A rail came off the steps the other day, and I was going down, and i fell on my left hip and hit my shoulder too." Pain Assessment Currently in Pain?: Yes Pain Score: 6  Pain Location: Shoulder Pain Orientation: Right Pain Type: Acute pain  Precautions/Restrictions     Exercise/Treatments Supine Protraction: PROM;10 reps Horizontal ABduction: PROM;10 reps External Rotation: PROM;10 reps Internal Rotation: PROM;10 reps Flexion: PROM;10 reps ABduction: PROM;10 reps Other Supine Exercises: Bridges 20x Seated Elevation: AROM;15 reps Extension: AROM;15 reps Row: AROM;15 reps Therapy Ball Flexion: 15 reps ABduction: 15 reps ROM / Strengthening / Isometric Strengthening Prot/Ret//Elev/Dep: 1 min   Manual Therapy Manual Therapy: Myofascial release Myofascial Release: Myofascial release to right shoulder, upper arm, trapezius and scapularis region to decrease fascial restrictions and increase joint mobility in a pain free zone.    Occupational Therapy Assessment and Plan OT Assessment and Plan Clinical Impression Statement: Continued PROM, MFR, and ball stretches this session, with good tolerance.  Pt with slight tremor to hands and face and stammer to voice at beginning of session - when asked, pt indicated when he is increased pain he has an essential tremor that also gets worse.  Pt also appeared to be warm and sweaty. provided pt with glass of water and rest break.  Pt appeared to improve.  pt also indicated  he had not eaten today. Advised pt to eat as soon as he leaves therapy. pt verbalized feeling better and that he was fine to return home (and get food). Added pro/ret/elev/dep, and pt required some verbal and tactile cueing.   OT Plan: Add thumbtacks.     Goals Short Term Goals Short Term Goal 1: Patient will be educated on HEP.  Short Term Goal 1 Progress: Progressing toward goal Short Term Goal 2: Patient will decrease pain to 3/10 during daily tasks.  Short Term Goal 2 Progress: Progressing toward goal Short Term Goal 3: Patient will decrease fascial restrictions from max to mod fascial restrictions.  Short Term Goal 3 Progress: Progressing toward goal Short Term Goal 4: Patient will increase AROM to [facility] to increase ability to donn upper body clothing.  Short Term Goal 4 Progress: Progressing toward goal Short Term Goal 5: Patient will increase strength to 3/5 to increase ability to operate manual gear shift.  Short Term Goal 5 Progress: Progressing toward goal Long Term Goals Long Term Goal 1: Patient will regain highest level of functioning and independence in all daily tasks.  Long Term Goal 1 Progress: Progressing toward goal Long Term Goal 2: Patient will decrease pain to 1/10 or less during daily tasks.  Long Term Goal 2 Progress: Progressing toward goal Long Term Goal 3: Patient will decrease fascial restrictions from mod to min or less.  Long Term Goal 3 Progress: Progressing toward goal Long Term Goal 4: Patient will increase AROM to WNL to increase ability to reach onto overhead shelves.  Long Term Goal 4 Progress: Progressing toward goal Long Term Goal 5: Patient will increase strength to 4/5 to increase ability to play musical instruments.  Long Term Goal 5  Progress: Progressing toward goal  Problem List Patient Active Problem List   Diagnosis Date Noted  . Muscle weakness (generalized) 07/02/2014  . Decreased range of motion of right shoulder 07/02/2014  . Rotator cuff  impingement syndrome of right shoulder 06/12/2014  . Coronary atherosclerosis of native coronary artery 09/26/2013  . Tobacco abuse 09/26/2013  . Essential hypertension, benign 09/25/2013    End of Session Activity Tolerance: Patient tolerated treatment well General Behavior During Therapy: [facility] for tasks assessed/performed  GO    Bea Graff Susie Pousson, [place], OTR/L [place] [phone] 07/18/2014, 4:24 PM

## 2014-07-23 ENCOUNTER — Ambulatory Visit (HOSPITAL_COMMUNITY)
Admission: RE | Admit: 2014-07-23 | Discharge: 2014-07-23 | Disposition: A | Discharge status: Home / Self Care | Payer: Medicare Other | Source: Ambulatory Visit | Attending: Orthopaedic Surgery | Admitting: Orthopaedic Surgery

## 2014-07-23 DIAGNOSIS — Z4789 Encounter for other orthopedic aftercare: Secondary | ICD-10-CM | POA: Diagnosis not present

## 2014-07-23 NOTE — Progress Notes (Signed)
Occupational Therapy Treatment Patient Details  Name: Peter Campbell MRN: [id number] Date of Birth: 1955/11/28  Today's Date: 07/23/2014 Time: 4268-3419 OT Time Calculation (min): 38 min Manual 1435-1501 (26') Therapeutic Exercises 1501-1513 (12')  Visit#: 6 of 24  Re-eval: 07/30/14    Authorization: Medicare Part A primary, Medicaid secondary  Authorization Time Period: Before 10th visit  Authorization Visit#: 6 of 10  Subjective Symptoms/Limitations Symptoms: "its not that bad today.  I just know its there." Pain Assessment Currently in Pain?: Yes Pain Score: 3  Pain Location: Shoulder Pain Orientation: Right Pain Type: Acute pain  Exercise/Treatments Supine Protraction: PROM;10 reps Horizontal ABduction: PROM;10 reps External Rotation: PROM;10 reps Internal Rotation: PROM;10 reps Flexion: PROM;10 reps ABduction: PROM;10 reps Seated Elevation: AROM;15 reps Extension: AROM;15 reps Row: AROM;15 reps Therapy Ball Flexion: 15 reps ABduction: 15 reps ROM / Strengthening / Isometric Strengthening Thumb Tacks: 1 min Prot/Ret//Elev/Dep: 1 min   Manual Therapy Manual Therapy: Myofascial release Myofascial Release: Myofascial release to right shoulder, upper arm, trapezius and scapularis region to decrease fascial restrictions and increase joint mobility in a pain free zone.    Occupational Therapy Assessment and Plan OT Assessment and Plan Clinical Impression Statement: Pt did have to take nitroglycerin pill after meal after appointment last week. pt reported he felt better. Pt indicated he will tell OTR with any further instances of not feeling well. continue PROm this session, with good tolerance.  Added pro/ret/elev/dep and thumbtacks.  Pt tolerated well, with minimal pain.   OT Plan: P: Add isometrics.     Goals Short Term Goals Short Term Goal 1: Patient will be educated on HEP.  Short Term Goal 1 Progress: Progressing toward goal Short Term Goal 2: Patient  will decrease pain to 3/10 during daily tasks.  Short Term Goal 2 Progress: Progressing toward goal Short Term Goal 3: Patient will decrease fascial restrictions from max to mod fascial restrictions.  Short Term Goal 3 Progress: Progressing toward goal Short Term Goal 4: Patient will increase AROM to [facility] to increase ability to donn upper body clothing.  Short Term Goal 4 Progress: Progressing toward goal Short Term Goal 5: Patient will increase strength to 3/5 to increase ability to operate manual gear shift.  Short Term Goal 5 Progress: Progressing toward goal Long Term Goals Long Term Goal 1: Patient will regain highest level of functioning and independence in all daily tasks.  Long Term Goal 1 Progress: Progressing toward goal Long Term Goal 2: Patient will decrease pain to 1/10 or less during daily tasks.  Long Term Goal 2 Progress: Progressing toward goal Long Term Goal 3: Patient will decrease fascial restrictions from mod to min or less.  Long Term Goal 3 Progress: Progressing toward goal Long Term Goal 4: Patient will increase AROM to WNL to increase ability to reach onto overhead shelves.  Long Term Goal 4 Progress: Progressing toward goal Long Term Goal 5: Patient will increase strength to 4/5 to increase ability to play musical instruments.  Long Term Goal 5 Progress: Progressing toward goal  Problem List Patient Active Problem List   Diagnosis Date Noted  . Muscle weakness (generalized) 07/02/2014  . Decreased range of motion of right shoulder 07/02/2014  . Rotator cuff impingement syndrome of right shoulder 06/12/2014  . Coronary atherosclerosis of native coronary artery 09/26/2013  . Tobacco abuse 09/26/2013  . Essential hypertension, benign 09/25/2013    End of Session Activity Tolerance: Patient tolerated treatment well General Behavior During Therapy: [facility] for tasks assessed/performed  [place] Lachina Salsberry, MS, OTR/L [place] [phone] 07/23/2014, 4:16 PM

## 2014-07-25 ENCOUNTER — Ambulatory Visit (HOSPITAL_COMMUNITY)
Admission: RE | Admit: 2014-07-25 | Discharge: 2014-07-25 | Disposition: A | Discharge status: Home / Self Care | Payer: Medicare Other | Source: Ambulatory Visit | Attending: Orthopaedic Surgery | Admitting: Orthopaedic Surgery

## 2014-07-25 DIAGNOSIS — Z4789 Encounter for other orthopedic aftercare: Secondary | ICD-10-CM | POA: Diagnosis not present

## 2014-07-25 NOTE — Progress Notes (Addendum)
Occupational Therapy Treatment Patient Details  Name: Peter Campbell A [place] MRN: [id number] Date of Birth: 05/23/1956  Today's Date: 07/25/2014 Time: 1430-1510 OT Time Calculation (min): 40 min Manual 1430-1455 (25') Therapeutic Exercises 1455-1510 (15')  Visit#: 7 of 24  Re-eval: 07/30/14    Authorization: Medicare Part A primary, Medicaid secondary  Authorization Time Period: Before 10th visit  Authorization Visit#: 7 of 10  Subjective Symptoms/Limitations Symptoms: "I had a bad night - I didn't sleep well." Limitations: PROM for 4 weeks (07/30/14) AAROM for 4 weeks (08/27/14) then progress as tolerated.  Pain Assessment Currently in Pain?: Yes Pain Score: 5  Pain Location: Shoulder Pain Orientation: Right Pain Type: Acute pain  Precautions/Restrictions     Exercise/Treatments Supine Protraction: PROM;10 reps Horizontal ABduction: PROM;10 reps External Rotation: PROM;10 reps Internal Rotation: PROM;10 reps Flexion: PROM;10 reps ABduction: PROM;10 reps Seated Elevation: AROM;15 reps Extension: AROM;15 reps Row: AROM;15 reps   Therapy Ball Flexion: 25 reps ABduction: 25 reps ROM / Strengthening / Isometric Strengthening   Flexion: 3X3" Extension: 3X3" External Rotation: 3X3" Internal Rotation: 3X3" ABduction: 3X3" ADduction: 3X3" Manual Therapy Manual Therapy: Myofascial release Myofascial Release: Myofascial release to right shoulder, upper arm, trapezius and scapularis region to decrease fascial restrictions and increase joint mobility in a pain free zone.     Occupational Therapy Assessment and Plan OT Assessment and Plan Clinical Impression Statement: Continued PROM, MFR, and isometrics this session. Pt tolerated well, with pain only at end ranges.    Increased ball stretch reps, and pt tolerated well.   OT Plan: P: Re-evaluate.  Add AAROM supine.     Goals Short Term Goals Short Term Goal 1: Patient will be educated on HEP.  Short Term Goal 1 Progress:  Progressing toward goal Short Term Goal 2: Patient will decrease pain to 3/10 during daily tasks.  Short Term Goal 2 Progress: Progressing toward goal Short Term Goal 3: Patient will decrease fascial restrictions from max to mod fascial restrictions.  Short Term Goal 3 Progress: Progressing toward goal Short Term Goal 4: [facility] to increase ability to donn upper body clothing.  Short Term Goal 4 Progress: Progressing toward goal Short Term Goal 5: Patient will increase strength to 3/5 to increase ability to operate manual gear shift.  Short Term Goal 5 Progress: Progressing toward goal Long Term Goals Long Term Goal 1: Patient will regain highest level of functioning and independence in all daily tasks.  Long Term Goal 1 Progress: Progressing toward goal Long Term Goal 2: Patient will decrease pain to 1/10 or less during daily tasks.  Long Term Goal 2 Progress: Progressing toward goal Long Term Goal 3: Patient will decrease fascial restrictions from mod to min or less.  Long Term Goal 3 Progress: Progressing toward goal Long Term Goal 4: Patient will increase AROM to WNL to increase ability to reach onto overhead shelves.  Long Term Goal 4 Progress: Progressing toward goal Long Term Goal 5: Patient will increase strength to 4/5 to increase ability to play musical instruments.  Long Term Goal 5 Progress: Progressing toward goal  Problem List Patient Active Problem List   Diagnosis Date Noted  . Muscle weakness (generalized) 07/02/2014  . Decreased range of motion of right shoulder 07/02/2014  . Rotator cuff impingement syndrome of right shoulder 06/12/2014  . Coronary atherosclerosis of native coronary artery 09/26/2013  . Tobacco abuse 09/26/2013  . Essential hypertension, benign 09/25/2013    End of Session Activity Tolerance: Patient tolerated treatment well  General Behavior During Therapy: [facility] for tasks assessed/performed  GO    Marry GuanMarie Rawlings  Diania Co, MS, OTR/L [facility] [phone] 07/25/2014, 4:19 PM

## 2014-07-26 ENCOUNTER — Telehealth (HOSPITAL_COMMUNITY): Payer: Self-pay

## 2014-07-26 NOTE — Telephone Encounter (Signed)
Left a message to schedule more appointments for him

## 2014-07-30 ENCOUNTER — Encounter (HOSPITAL_COMMUNITY): Payer: Self-pay

## 2014-07-30 ENCOUNTER — Ambulatory Visit (HOSPITAL_COMMUNITY)
Admission: RE | Admit: 2014-07-30 | Discharge: 2014-07-30 | Disposition: A | Discharge status: Home / Self Care | Payer: Medicare Other | Source: Ambulatory Visit | Attending: Physician Assistant | Admitting: Physician Assistant

## 2014-07-30 DIAGNOSIS — Z4789 Encounter for other orthopedic aftercare: Secondary | ICD-10-CM | POA: Insufficient documentation

## 2014-07-30 DIAGNOSIS — M25511 Pain in right shoulder: Secondary | ICD-10-CM | POA: Insufficient documentation

## 2014-07-30 DIAGNOSIS — R29898 Other symptoms and signs involving the musculoskeletal system: Secondary | ICD-10-CM

## 2014-07-30 DIAGNOSIS — M6281 Muscle weakness (generalized): Secondary | ICD-10-CM | POA: Diagnosis not present

## 2014-07-30 DIAGNOSIS — M25611 Stiffness of right shoulder, not elsewhere classified: Secondary | ICD-10-CM | POA: Diagnosis not present

## 2014-07-30 NOTE — Therapy (Addendum)
Occupational Therapy Re-Evaluation  Patient Details  Name: Peter Campbell MRN: [id number] Date of Birth: Nov 28, 1955  Encounter Date: 07/30/2014      OT End of Session - 07/30/14 1455    Authorization Type Medicare part A primary, Medicaid Secondary   Authorization Time Period Before 28th visit   Authorization - Visit Number 8   Authorization - Number of Visits 28   OT Start Time 1304   OT Stop Time 1350   OT Time Calculation (min) 46 min   Activity Tolerance Patient tolerated treatment well      Past Medical History  Diagnosis Date  . Essential hypertension, benign   . Chronic back pain   . Essential tremor   . Coronary atherosclerosis of native coronary artery     Dense  coronary atherosclerosis by chest CT November 2014   . History of chicken pox   . History of [place] measles   . Sciatica   . Collagen vascular disease   . Asthma     Past Surgical History  Procedure Laterality Date  . Colon surgery    . Abdominal surgery    . Tonsillectomy    . Circumcision    . Shoulder open rotator cuff repair Right 06/12/2014    Procedure: OPEN REPAIR ROTATOR CUFF RIGHT ;  Surgeon: [name], MD;  Location: [facility];  Service: Orthopedics;  Laterality: Right;  . Shoulder acromioplasty Right 06/12/2014    Procedure: RIGHT SHOULDER ACROMIOPLASTY;  Surgeon: [name], MD;  Location: [facility];  Service: Orthopedics;  Laterality: Right;    There were no vitals taken for this visit.  Visit Diagnosis:  Right Shoulder Pain Right Shoulder Weakness  Assessment: AROM Flexion: 136 Abduction: 116 IR: 95 ER: 22  PROM Flexion: 138 (was 71) Abduction: 144 (was 71) IR: 95 (was 86) ER 30 (was -10)  Manual: Myofascial release to right shoulder, upper arm, trapezius and scapularis region to decrease fascial restrictions and increase joint mobility in a pain free zone.       07/30/14 1310  Symptoms/Limitations  Limitations PROM for 4 weeks (07/30/14) AAROM for 4 weeks (08/27/14)  then progress as tolerated.   Pain Assessment  Currently in Pain? Yes  Pain Score 4  Pain Location Shoulder  Pain Orientation Right  Pain Type Acute pain         OT Treatments/Exercises (OP) - 07/30/14 0700    Protraction PROM;AAROM;10 reps   Horizontal ABduction PROM;AAROM;10 reps   External Rotation PROM;AAROM;10 reps   Internal Rotation PROM;AAROM;10 reps   Flexion PROM;AAROM;10 reps   ABduction PROM;AAROM;10 reps            OT Short Term Goals - 07/30/14 1501    Title  Patient will be educated on HEP.      Time 6   Period Weeks   Status On-going   Title Patient will decrease pain to 3/10 during daily tasks.      Time 6   Period Weeks   Status On-going   Title Patient will decrease fascial restrictions from max to mod fascial restrictions.    Time 6   Period Weeks   Status Achieved   Title Patient will increase AROM to [facility] to increase ability to donn upper body clothing.      Time 6   Period Weeks   Status On-going   Title Patient will increase strength to 3/5 to increase ability to operate manual gear shift.      Time 6  Period Weeks   Status On-going          OT Long Term Goals - 08-11-2014 1459    Title Patient will regain highest level of functioning and independence in all daily tasks.      Time 12   Period Weeks   Status On-going   Title Patient will decrease pain to 1/10 or less during daily tasks.      Time 12   Period Weeks   Status On-going   Title Patient will decrease fascial restrictions from mod to min or less.      Time 12   Period Weeks   Status Achieved   Title Patient will increase AROM to WNL to increase ability to reach onto overhead shelves.      Time 12   Period Weeks   Status On-going   Title Patient will increase strength to 4/5 to increase ability to play musical instruments.      Time 12   Period Weeks   Status On-going          Plan - 08/11/14 1515    Clinical Impression Statement Re-evaluation completed this  session. Pt is progressing well in therapy and has met 1 STG and 1 LTG, with good progress towards all remaining goals.  Added AAROM exercises this session, and pt tolerated well.   OT Plan continue AAROM supine. Follow up on soreness from addition of AAROM.  Add AAROM to HEP. continue therapy for an additional 4 weeks.          G-Codes - 08/11/2014 1504    Functional Assessment Tool Used FOTO score: Current 47/100 (53% limited)   Functional Limitation Carrying, moving and handling objects   Carrying, Moving and Handling Objects Current Status (T3646) At least 40 percent but less than 60 percent impaired, limited or restricted   Carrying, Moving and Handling Objects Goal Status (O0321) At least 20 percent but less than 40 percent impaired, limited or restricted      Problem List Patient Active Problem List   Diagnosis Date Noted  . Muscle weakness (generalized) 07/02/2014  . Decreased range of motion of right shoulder 07/02/2014  . Rotator cuff impingement syndrome of right shoulder 06/12/2014  . Coronary atherosclerosis of native coronary artery 09/26/2013  . Tobacco abuse 09/26/2013  . Essential hypertension, benign 09/25/2013     Bea Graff Sheridyn Canino, [place], OTR/L [place] [phone] 08-11-14, 3:17 PM

## 2014-07-31 NOTE — Addendum Note (Signed)
Encounter addended by: Adrian SaranMarie A Sibyl Mikula, OT on: 07/31/2014 12:28 PM<BR>     Documentation filed: Letters

## 2014-08-02 ENCOUNTER — Ambulatory Visit (HOSPITAL_COMMUNITY)
Admission: RE | Admit: 2014-08-02 | Discharge: 2014-08-02 | Disposition: A | Discharge status: Home / Self Care | Payer: Medicare Other | Source: Ambulatory Visit | Attending: Orthopaedic Surgery | Admitting: Orthopaedic Surgery

## 2014-08-02 ENCOUNTER — Encounter (HOSPITAL_COMMUNITY): Payer: Self-pay

## 2014-08-02 DIAGNOSIS — M6281 Muscle weakness (generalized): Secondary | ICD-10-CM

## 2014-08-02 DIAGNOSIS — R29898 Other symptoms and signs involving the musculoskeletal system: Secondary | ICD-10-CM

## 2014-08-02 DIAGNOSIS — Z4789 Encounter for other orthopedic aftercare: Secondary | ICD-10-CM | POA: Diagnosis not present

## 2014-08-02 DIAGNOSIS — M25511 Pain in right shoulder: Secondary | ICD-10-CM

## 2014-08-02 DIAGNOSIS — M25611 Stiffness of right shoulder, not elsewhere classified: Secondary | ICD-10-CM

## 2014-08-02 NOTE — Patient Instructions (Signed)
ROM: Flexion - Wand   Bring wand directly over head, leading with right side. Reach back until stretch is felt.  Repeat __10__ times per set. Do __1__ sets per session. Do __2__ sessions per day.  http://orth.exer.us/744   Copyright  VHI. All rights reserved.   ROM: Abduction - Wand   Holding wand with left hand palm up, push wand directly out to side, leading with other hand palm down, until stretch is felt. Repeat _10___ times per set. Do __1__ sets per session. Do _2___ sessions per day.  http://orth.exer.us/746   Copyright  VHI. All rights reserved.   ROM: Horizontal Abduction / Adduction - Wand   Keeping both palms down, push right hand across body with other hand. Then pull back across body, keeping arms parallel to floor. Do not allow trunk to twist.  Repeat __10__ times per set. Do __1__ sets per session. Do ___2_ sessions per day.  http://orth.exer.us/752   Copyright  VHI. All rights reserved.   ROM: External / Internal Rotation - Wand   Holding wand with left hand palm up, push out from body with other hand, palm down. Keep both elbows bent.  Repeat to other side, leading with same hand. Keep elbows bent. Repeat _10___ times per set. Do __1__ sets per session. Do _2___ sessions per day.  http://orth.exer.us/748   Copyright  VHI. All rights reserved.

## 2014-08-02 NOTE — Therapy (Addendum)
Occupational Therapy Treatment  Patient Details  Name: Peter Campbell MRN: [id number] Date of Birth: May 14, 1956  Encounter Date: 08/02/2014      OT End of Session - 08/02/14 1624    Visit Number 9   Number of Visits 24   Date for OT Re-Evaluation 08/27/14   Authorization Type Medicare part A primary, Medicaid Secondary   Authorization Time Period Before 28th visit   Authorization - Visit Number 9   Authorization - Number of Visits 28   OT Start Time 1437   OT Stop Time 1515   OT Time Calculation (min) 38 min   Activity Tolerance Patient tolerated treatment well      Past Medical History  Diagnosis Date  . Essential hypertension, benign   . Chronic back pain   . Essential tremor   . Coronary atherosclerosis of native coronary artery     Dense  coronary atherosclerosis by chest CT November 2014   . History of chicken pox   . History of [place] measles   . Sciatica   . Collagen vascular disease   . Asthma     Past Surgical History  Procedure Laterality Date  . Colon surgery    . Abdominal surgery    . Tonsillectomy    . Circumcision    . Shoulder open rotator cuff repair Right 06/12/2014    Procedure: OPEN REPAIR ROTATOR CUFF RIGHT ;  Surgeon: [name], MD;  Location: [facility];  Service: Orthopedics;  Laterality: Right;  . Shoulder acromioplasty Right 06/12/2014    Procedure: RIGHT SHOULDER ACROMIOPLASTY;  Surgeon: [name], MD;  Location: [facility];  Service: Orthopedics;  Laterality: Right;    There were no vitals taken for this visit.  Visit Diagnosis:  Pain in joint, shoulder region, right  Shoulder weakness  Decreased right shoulder range of motion  Muscle weakness (generalized)        [facility] OT Assessment - 08/02/14 1632    Precautions   Precautions Shoulder   Type of Shoulder Precautions PROM for 4 weeks (07/30/14) AAROM for 4 weeks (07/31/14) then progress as tolerated.       08/02/14 1505  Symptoms/Limitations  Symptoms S: Last time I was  really sore.        OT Treatments/Exercises (OP) - 08/02/14 1506    Shoulder Exercises: Supine   Protraction PROM;AAROM;10 reps   Horizontal ABduction PROM;AAROM;10 reps   External Rotation PROM;AAROM;10 reps   Internal Rotation PROM;AAROM;10 reps   Flexion PROM;AAROM;10 reps   ABduction PROM;AAROM;10 reps   Shoulder Exercises: Seated   Protraction AAROM;10 reps   Horizontal ABduction AAROM;10 reps   External Rotation AAROM;10 reps   Internal Rotation AAROM;10 reps   Flexion AAROM;10 reps   Abduction AAROM;10 reps   Shoulder Exercises: Standing   Protraction AAROM;10 reps   Horizontal ABduction AAROM;10 reps   External Rotation AAROM;10 reps   Internal Rotation AAROM;10 reps   Flexion AAROM;10 reps   ABduction AAROM;10 reps   Manual Therapy   Manual Therapy Myofascial release    Myofascial release to right shoulder, upper arm, trapezius and scapularis region to decrease fascial restrictions and increase joint mobility in a pain free zone.      Education - 08/02/14 1623    Education provided Yes   Education Details AAROM exercises-HEP   Education Details Patient   Methods Explanation;Demonstration   Comprehension Verbalized understanding          OT Short Term Goals - 08/02/14 1626  OT SHORT TERM GOAL #1   Title  Patient will be educated on HEP.      Time 6   Period Weeks   Status On-going   OT SHORT TERM GOAL #2   Title Patient will decrease pain to 3/10 during daily tasks.      Time 6   Period Weeks   Status On-going   OT SHORT TERM GOAL #3   Title Patient will decrease fascial restrictions from max to mod fascial restrictions.    Time 6   Period Weeks   Status --   OT SHORT TERM GOAL #4   Title Patient will increase AROM to [employer] to increase ability to donn upper body clothing.      Time 6   Period Weeks   Status On-going   OT SHORT TERM GOAL #5   Title Patient will increase strength to 3/5 to increase ability to operate manual gear shift.       Time 6   Period Weeks   Status On-going          OT Long Term Goals - 08/02/14 1626    OT LONG TERM GOAL #1   Title Patient will regain highest level of functioning and independence in all daily tasks.      Time 12   Period Weeks   Status On-going   OT LONG TERM GOAL #2   Title Patient will decrease pain to 1/10 or less during daily tasks.      Time 12   Period Weeks   Status On-going   OT LONG TERM GOAL #3   Title Patient will decrease fascial restrictions from mod to min or less.      Time 12   Period Weeks   Status --   OT LONG TERM GOAL #4   Title Patient will increase AROM to WNL to increase ability to reach onto overhead shelves.      Time 12   Period Weeks   Status On-going   OT LONG TERM GOAL #5   Title Patient will increase strength to 4/5 to increase ability to play musical instruments.      Time 12   Period Weeks   Status On-going          Plan - 08/02/14 1624    Clinical Impression Statement A: Added AAROM in standing. Patient tolerated treatment well. patient reports increased soreness after previous session. Patient provided with AAROM HEP.    OT Plan P: Add thumb tacks, prot/ret/elev/dep. Follow up on AAROM HEP. Add Pulleys.        Problem List Patient Active Problem List   Diagnosis Date Noted  . Muscle weakness (generalized) 07/02/2014  . Decreased range of motion of right shoulder 07/02/2014  . Rotator cuff impingement syndrome of right shoulder 06/12/2014  . Coronary atherosclerosis of native coronary artery 09/26/2013  . Tobacco abuse 09/26/2013  . Essential hypertension, benign 09/25/2013        Ailene Ravel, OTR/L,CBIS   08/02/2014, 4:32 PM

## 2014-08-06 ENCOUNTER — Ambulatory Visit (HOSPITAL_COMMUNITY)
Admission: RE | Admit: 2014-08-06 | Discharge: 2014-08-06 | Disposition: A | Discharge status: Home / Self Care | Payer: Medicare Other | Source: Ambulatory Visit | Attending: Physician Assistant | Admitting: Physician Assistant

## 2014-08-06 ENCOUNTER — Encounter (HOSPITAL_COMMUNITY): Payer: Self-pay

## 2014-08-06 DIAGNOSIS — M25611 Stiffness of right shoulder, not elsewhere classified: Secondary | ICD-10-CM

## 2014-08-06 DIAGNOSIS — M25511 Pain in right shoulder: Secondary | ICD-10-CM

## 2014-08-06 DIAGNOSIS — Z4789 Encounter for other orthopedic aftercare: Secondary | ICD-10-CM | POA: Diagnosis not present

## 2014-08-06 DIAGNOSIS — R29898 Other symptoms and signs involving the musculoskeletal system: Secondary | ICD-10-CM

## 2014-08-06 DIAGNOSIS — M6281 Muscle weakness (generalized): Secondary | ICD-10-CM

## 2014-08-06 NOTE — Patient Instructions (Signed)
Flexibility: Corner Stretch   Standing in corner with hands just above shoulder level; lean forward until a comfortable stretch is felt across chest. Hold 10 seconds. Repeat 3-5 times per set.  Do 3+ sessions per day.  http://orth.exer.us/342   Copyright  VHI. All rights reserved.

## 2014-08-06 NOTE — Therapy (Signed)
Occupational Therapy Treatment  Patient Details  Name: Peter Campbell MRN: [id number] Date of Birth: 11/08/1955  Encounter Date: 08/06/2014      OT End of Session - 08/06/14 1548    Visit Number 10   Number of Visits 24   Date for OT Re-Evaluation 08/27/14   Authorization Type Medicare part A primary, Medicaid Secondary   Authorization Time Period Before 28th visit   Authorization - Visit Number 10   Authorization - Number of Visits 28   OT Start Time 1448   OT Stop Time 1529   OT Time Calculation (min) 41 min   Activity Tolerance Patient tolerated treatment well      Past Medical History  Diagnosis Date  . Essential hypertension, benign   . Chronic back pain   . Essential tremor   . Coronary atherosclerosis of native coronary artery     Dense  coronary atherosclerosis by chest CT November 2014   . History of chicken pox   . History of [place] measles   . Sciatica   . Collagen vascular disease   . Asthma     Past Surgical History  Procedure Laterality Date  . Colon surgery    . Abdominal surgery    . Tonsillectomy    . Circumcision    . Shoulder open rotator cuff repair Right 06/12/2014    Procedure: OPEN REPAIR ROTATOR CUFF RIGHT ;  Surgeon: [name], MD;  Location: [facility];  Service: Orthopedics;  Laterality: Right;  . Shoulder acromioplasty Right 06/12/2014    Procedure: RIGHT SHOULDER ACROMIOPLASTY;  Surgeon: [name], MD;  Location: [facility];  Service: Orthopedics;  Laterality: Right;    There were no vitals taken for this visit.  Visit Diagnosis:  Pain in joint, shoulder region, right  Shoulder weakness  Decreased right shoulder range of motion  Muscle weakness (generalized)  Decreased range of motion of right shoulder     08/06/14 1449  Symptoms/Limitations  Symptoms "I didn't sleep for anything last night. I fell."  Limitations PROM for 4 weeks (07/30/14) AAROM for 4 weeks (08/27/14) then progress as tolerated.   Pain Assessment  Currently  in Pain? Yes  Pain Score 3  Pain Location Shoulder  Pain Orientation Right  Pain Type Acute pain          OT Treatments/Exercises (OP) - 08/06/14 1514    Shoulder Exercises: Supine   Protraction PROM;AAROM;10 reps;12 reps   Horizontal ABduction PROM;AAROM;10 reps;12 reps   External Rotation PROM;AAROM;10 reps;12 reps   Internal Rotation PROM;AAROM;10 reps;12 reps   Flexion PROM;AAROM;10 reps;12 reps   ABduction PROM;AAROM;10 reps;12 reps   Shoulder Exercises: Stretch   Corner Stretch 3 reps;10 seconds   Manual Therapy   Manual Therapy Myofascial release: MFR and manual stretching to RUE bicep, upper trap, pec, deltoid regions to decrease fascial restrictions and promote decreased pain          Education - 08/06/14 1547    Education provided Yes   Education Details Warehouse manager for HEP   Education Details Patient   Methods Explanation;Demonstration;Handout   Comprehension Verbalized understanding;Returned demonstration          OT Short Term Goals - 08/06/14 1602    OT SHORT TERM GOAL #1   Title  Patient will be educated on HEP.      Status On-going   OT SHORT TERM GOAL #2   Title Patient will decrease pain to 3/10 during daily tasks.      Status  On-going   OT SHORT TERM GOAL #3   Title Patient will decrease fascial restrictions from max to mod fascial restrictions.    Status Achieved   OT SHORT TERM GOAL #4   Title Patient will increase AROM to [facility] to increase ability to donn upper body clothing.      Status On-going   OT SHORT TERM GOAL #5   Title Patient will increase strength to 3/5 to increase ability to operate manual gear shift.      Status On-going          OT Long Term Goals - 08/06/14 1603    OT LONG TERM GOAL #1   Title Patient will regain highest level of functioning and independence in all daily tasks.      Status On-going   OT LONG TERM GOAL #2   Title Patient will decrease pain to 1/10 or less during daily tasks.      Status On-going    OT LONG TERM GOAL #3   Title Patient will decrease fascial restrictions from mod to min or less.      Status Achieved   OT LONG TERM GOAL #4   Title Patient will increase AROM to WNL to increase ability to reach onto overhead shelves.      Status On-going   OT LONG TERM GOAL #5   Title Patient will increase strength to 4/5 to increase ability to play musical instruments.      Status On-going          Plan - 08/06/14 1549    Clinical Impression Statement Continued manual, PROM, and AAROM this session.  Pt tolerated well increase in AAROM reps. pt had increased pec tightness this session - educated pt on corner stretch for decreased pec tightness.   OT Plan Follow up on AAROM and corner stretch HEPs. Pro/ret/elev/dep and thumbtacks.        Problem List Patient Active Problem List   Diagnosis Date Noted  . Muscle weakness (generalized) 07/02/2014  . Decreased range of motion of right shoulder 07/02/2014  . Rotator cuff impingement syndrome of right shoulder 06/12/2014  . Coronary atherosclerosis of native coronary artery 09/26/2013  . Tobacco abuse 09/26/2013  . Essential hypertension, benign 09/25/2013      Bea Graff Hansford Hirt, [place], OTR/L [place] [phone] 08/06/2014, 4:06 PM

## 2014-08-07 ENCOUNTER — Ambulatory Visit (HOSPITAL_COMMUNITY): Payer: Medicare Other

## 2014-08-08 NOTE — Addendum Note (Signed)
Encounter addended by: Adrian SaranMarie A Aarnav Steagall, OT on: 08/08/2014 11:33 AM<BR>     Documentation filed: Clinical Notes

## 2014-08-08 NOTE — Addendum Note (Signed)
Encounter addended by: Jeanene ErbLaura D Amyjo Mizrachi, OT on: 08/08/2014  9:18 AM<BR>     Documentation filed: Clinical Notes

## 2014-08-09 ENCOUNTER — Ambulatory Visit (HOSPITAL_COMMUNITY): Payer: Medicare Other

## 2014-08-14 ENCOUNTER — Encounter (HOSPITAL_COMMUNITY): Payer: Self-pay

## 2014-08-14 ENCOUNTER — Ambulatory Visit (HOSPITAL_COMMUNITY)
Admission: RE | Admit: 2014-08-14 | Discharge: 2014-08-14 | Disposition: A | Discharge status: Home / Self Care | Payer: Medicare Other | Source: Ambulatory Visit | Attending: Physician Assistant | Admitting: Physician Assistant

## 2014-08-14 DIAGNOSIS — Z4789 Encounter for other orthopedic aftercare: Secondary | ICD-10-CM | POA: Diagnosis not present

## 2014-08-14 DIAGNOSIS — M25611 Stiffness of right shoulder, not elsewhere classified: Secondary | ICD-10-CM

## 2014-08-14 DIAGNOSIS — R29898 Other symptoms and signs involving the musculoskeletal system: Secondary | ICD-10-CM

## 2014-08-14 DIAGNOSIS — M6281 Muscle weakness (generalized): Secondary | ICD-10-CM

## 2014-08-14 DIAGNOSIS — M25511 Pain in right shoulder: Secondary | ICD-10-CM

## 2014-08-14 NOTE — Therapy (Signed)
Occupational Therapy Treatment  Patient Details  Name: Peter Campbell MRN: [id number] Date of Birth: 1955/12/16  Encounter Date: 08/14/2014      OT End of Session - 08/14/14 1525    Visit Number 11   Number of Visits 24   Date for OT Re-Evaluation 08/27/14   Authorization Type Medicare part A primary, Medicaid Secondary   Authorization Time Period Before 28th visit   Authorization - Visit Number 11   Authorization - Number of Visits 28   Activity Tolerance Patient tolerated treatment well   Behavior During Therapy [facility] for tasks assessed/performed      Past Medical History  Diagnosis Date  . Essential hypertension, benign   . Chronic back pain   . Essential tremor   . Coronary atherosclerosis of native coronary artery     Dense  coronary atherosclerosis by chest CT November 2014   . History of chicken pox   . History of [place] measles   . Sciatica   . Collagen vascular disease   . Asthma     Past Surgical History  Procedure Laterality Date  . Colon surgery    . Abdominal surgery    . Tonsillectomy    . Circumcision    . Shoulder open rotator cuff repair Right 06/12/2014    Procedure: OPEN REPAIR ROTATOR CUFF RIGHT ;  Surgeon: [name], MD;  Location: [facility];  Service: Orthopedics;  Laterality: Right;  . Shoulder acromioplasty Right 06/12/2014    Procedure: RIGHT SHOULDER ACROMIOPLASTY;  Surgeon: [name], MD;  Location: [facility];  Service: Orthopedics;  Laterality: Right;    There were no vitals taken for this visit.  Visit Diagnosis:  Pain in joint, shoulder region, right  Shoulder weakness  Decreased right shoulder range of motion  Muscle weakness (generalized)      Subjective Assessment - 08/14/14 1509    Symptoms S: I am sore today. I was helping a friend pick up wood from his yard. I used my right arm to stabilize. the wood when I was carrying it.    Limitations PROM for 4 weeks (07/30/14) AAROM for 4 weeks (08/27/14) then progress as tolerated.     Currently in Pain? Yes   Pain Score 6    Pain Location Shoulder   Pain Orientation Right   Pain Descriptors / Indicators Aching   Pain Type Acute pain          [facility] OT Assessment - 08/14/14 1456    Precautions   Precautions Shoulder   Type of Shoulder Precautions PROM for 4 weeks (07/30/14) AAROM for 4 weeks (07/31/14) then progress as tolerated.           OT Treatments/Exercises (OP) - 08/14/14 1456    Shoulder Exercises: Supine   Protraction PROM;10 reps;AAROM;12 reps   Horizontal ABduction PROM;10 reps;AAROM;12 reps   External Rotation PROM;10 reps;AAROM;12 reps   Internal Rotation PROM;10 reps;AAROM;12 reps   Flexion PROM;10 reps;AAROM;12 reps   ABduction PROM;10 reps;AAROM;12 reps   Shoulder Exercises: Standing   Protraction AAROM;10 reps   Horizontal ABduction AAROM;10 reps   External Rotation AAROM;10 reps   Internal Rotation AAROM;10 reps   Flexion AAROM;10 reps   ABduction AAROM;10 reps   Shoulder Exercises: ROM/Strengthening   Thumb Tacks 1'   Prot/Ret//Elev/Dep 1'            OT Short Term Goals - 08/14/14 1504    OT SHORT TERM GOAL #1   Title  Patient will be educated on HEP.  Time 6   Period Weeks   Status On-going   OT SHORT TERM GOAL #2   Title Patient will decrease pain to 3/10 during daily tasks.      Time 6   Period Weeks   Status On-going   OT SHORT TERM GOAL #3   Title Patient will decrease fascial restrictions from max to mod fascial restrictions.    Time 6   Period Weeks   Status Achieved   OT SHORT TERM GOAL #4   Title Patient will increase AROM to [facility] to increase ability to donn upper body clothing.      Time 6   Period Weeks   Status On-going   OT SHORT TERM GOAL #5   Title Patient will increase strength to 3/5 to increase ability to operate manual gear shift.      Time 6   Period Weeks   Status On-going          OT Long Term Goals - 08/14/14 1507    OT LONG TERM GOAL #1   Title Patient will regain highest  level of functioning and independence in all daily tasks.      Time 12   Period Weeks   Status On-going   OT LONG TERM GOAL #2   Title Patient will decrease pain to 1/10 or less during daily tasks.      Time 12   Period Weeks   Status On-going   OT LONG TERM GOAL #3   Title Patient will decrease fascial restrictions from mod to min or less.      Time 12   Period Weeks   Status Achieved   OT LONG TERM GOAL #4   Title Patient will increase AROM to WNL to increase ability to reach onto overhead shelves.      Time 12   Period Weeks   Status On-going   OT LONG TERM GOAL #5   Title Patient will increase strength to 4/5 to increase ability to play musical instruments.      Time 12   Period Weeks   Status On-going          Plan - 08/14/14 1523    Clinical Impression Statement A: Increased AAROM exercises to 12X supine. Patient tolerated well. Slight pain with passives stretching at end of stretch.    Plan P: Add pulleys and wall wash. Increase AAROM standing exercises to 12X when able to tolerate.         Problem List Patient Active Problem List   Diagnosis Date Noted  . Muscle weakness (generalized) 07/02/2014  . Decreased range of motion of right shoulder 07/02/2014  . Rotator cuff impingement syndrome of right shoulder 06/12/2014  . Coronary atherosclerosis of native coronary artery 09/26/2013  . Tobacco abuse 09/26/2013  . Essential hypertension, benign 09/25/2013      Ailene Ravel, OTR/L,CBIS   08/14/2014, 3:26 PM

## 2014-08-16 ENCOUNTER — Encounter (HOSPITAL_COMMUNITY): Payer: Self-pay

## 2014-08-16 ENCOUNTER — Ambulatory Visit (HOSPITAL_COMMUNITY)
Admission: RE | Admit: 2014-08-16 | Discharge: 2014-08-16 | Disposition: A | Discharge status: Home / Self Care | Payer: Medicare Other | Source: Ambulatory Visit | Attending: Physician Assistant | Admitting: Physician Assistant

## 2014-08-16 DIAGNOSIS — M6281 Muscle weakness (generalized): Secondary | ICD-10-CM

## 2014-08-16 DIAGNOSIS — M25511 Pain in right shoulder: Secondary | ICD-10-CM

## 2014-08-16 DIAGNOSIS — M25611 Stiffness of right shoulder, not elsewhere classified: Secondary | ICD-10-CM

## 2014-08-16 DIAGNOSIS — R29898 Other symptoms and signs involving the musculoskeletal system: Secondary | ICD-10-CM

## 2014-08-16 DIAGNOSIS — Z4789 Encounter for other orthopedic aftercare: Secondary | ICD-10-CM | POA: Diagnosis not present

## 2014-08-16 NOTE — Therapy (Signed)
Occupational Therapy Treatment  Patient Details  Name: Peter Campbell A Melito MRN: [id number] Date of Birth: 05/21/1956  Encounter Date: 08/16/2014      OT End of Session - 08/16/14 1639    Visit Number 12   Number of Visits 24   Date for OT Re-Evaluation 08/27/14   Authorization Type Medicare part A primary, Medicaid Secondary   Authorization Time Period Before 28th visit   Authorization - Visit Number 12   Authorization - Number of Visits 28   OT Start Time 1439   OT Stop Time 1515   OT Time Calculation (min) 36 min   Activity Tolerance Patient tolerated treatment well   Behavior During Therapy WFL for tasks assessed/performed      Past Medical History  Diagnosis Date  . Essential hypertension, benign   . Chronic back pain   . Essential tremor   . Coronary atherosclerosis of native coronary artery     Dense  coronary atherosclerosis by chest CT November 2014   . History of chicken pox   . History of [place]   . Sciatica   . Collagen vascular disease   . Asthma     Past Surgical History  Procedure Laterality Date  . Colon surgery    . Abdominal surgery    . Tonsillectomy    . Circumcision    . Shoulder open rotator cuff repair Right 06/12/2014    Procedure: OPEN REPAIR ROTATOR CUFF RIGHT ;  Surgeon: Darreld McleanWayne Keeling, MD;  Location: [facility];  Service: Orthopedics;  Laterality: Right;  . Shoulder acromioplasty Right 06/12/2014    Procedure: RIGHT SHOULDER ACROMIOPLASTY;  Surgeon: Darreld McleanWayne Keeling, MD;  Location: [facility];  Service: Orthopedics;  Laterality: Right;    There were no vitals taken for this visit.  Visit Diagnosis:  Pain in joint, shoulder region, right  Shoulder weakness  Decreased right shoulder range of motion  Muscle weakness (generalized)      Subjective Assessment - 08/16/14 1439    Symptoms S: I'm a little sore today but it isn't bad.    Currently in Pain? Yes   Pain Score 5    Pain Location Shoulder   Pain Orientation Right   Pain Descriptors  / Indicators Aching   Pain Type Acute pain          [facility] OT Assessment - 08/16/14 1440    Precautions   Precautions Shoulder   Type of Shoulder Precautions PROM for 4 weeks (07/30/14) AAROM for 4 weeks (07/31/14) then progress as tolerated.           OT Treatments/Exercises (OP) - 08/16/14 1458    Shoulder Exercises: Supine   Protraction PROM;10 reps;AAROM;15 reps   Horizontal ABduction PROM;10 reps;AAROM;12 reps   External Rotation PROM;10 reps;AAROM;12 reps   Internal Rotation PROM;10 reps;AAROM;12 reps   Flexion PROM;10 reps;AAROM;15 reps   ABduction PROM;10 reps;AAROM;12 reps   Shoulder Exercises: Standing   Protraction AAROM;10 reps   Horizontal ABduction AAROM;10 reps   External Rotation AAROM;10 reps   Internal Rotation AAROM;10 reps   Flexion AAROM;10 reps   ABduction AAROM;10 reps   Shoulder Exercises: ROM/Strengthening   Wall Wash 1'   Manual Therapy   Manual Therapy Myofascial release   Myofascial Release Myofascial release: MFR and manual stretching to RUE bicep, upper trap, pec, deltoid regions to decrease fascial restrictions and promote decreased pain             OT Short Term Goals - 08/16/14 1502  OT SHORT TERM GOAL #1   Title  Patient will be educated on HEP.      Time 6   Period Weeks   Status On-going   OT SHORT TERM GOAL #2   Title Patient will decrease pain to 3/10 during daily tasks.      Time 6   Period Weeks   Status On-going   OT SHORT TERM GOAL #3   Title Patient will decrease fascial restrictions from max to mod fascial restrictions.    Time 6   Period Weeks   OT SHORT TERM GOAL #4   Title [facility] will [facility] to increase ability to donn upper body clothing.      Time 6   Period Weeks   Status On-going   OT SHORT TERM GOAL #5   Title Patient will increase strength to 3/5 to increase ability to operate manual gear shift.      Time 6   Period Weeks   Status On-going          OT Long Term Goals - 08/16/14  1502    OT LONG TERM GOAL #1   Title Patient will regain highest level of functioning and independence in all daily tasks.      Time 12   Period Weeks   Status On-going   OT LONG TERM GOAL #2   Title Patient will decrease pain to 1/10 or less during daily tasks.      Time 12   Period Weeks   Status On-going   OT LONG TERM GOAL #3   Title Patient will decrease fascial restrictions from mod to min or less.      Time 12   Period Weeks   OT LONG TERM GOAL #4   Title Patient will increase AROM to WNL to increase ability to reach onto overhead shelves.      Time 12   Period Weeks   Status On-going   OT LONG TERM GOAL #5   Title Patient will increase strength to 4/5 to increase ability to play musical instruments.      Time 12   Period Weeks   Status On-going          Plan - 08/16/14 1641    Clinical Impression Statement A: Added wall wash this session. patient tolerated well. Continued to complete AAROM standing 10X versus increasing. Unable to add pulleys due to time constraint.    Plan P: Add pulleys and increase AAROM standing to 12X.        Problem List Patient Active Problem List   Diagnosis Date Noted  . Muscle weakness (generalized) 07/02/2014  . Decreased range of motion of right shoulder 07/02/2014  . Rotator cuff impingement syndrome of right shoulder 06/12/2014  . Coronary atherosclerosis of native coronary artery 09/26/2013  . Tobacco abuse 09/26/2013  . Essential hypertension, benign 09/25/2013   Limmie PatriciaLaura Xai Frerking, OTR/L,CBIS  [phone]  08/16/2014, 4:43 PM

## 2014-08-20 ENCOUNTER — Ambulatory Visit (HOSPITAL_COMMUNITY)
Admission: RE | Admit: 2014-08-20 | Discharge: 2014-08-20 | Disposition: A | Discharge status: Home / Self Care | Payer: Medicare Other | Source: Ambulatory Visit | Attending: Physician Assistant | Admitting: Physician Assistant

## 2014-08-20 DIAGNOSIS — R29898 Other symptoms and signs involving the musculoskeletal system: Secondary | ICD-10-CM

## 2014-08-20 DIAGNOSIS — M25611 Stiffness of right shoulder, not elsewhere classified: Secondary | ICD-10-CM

## 2014-08-20 DIAGNOSIS — M25511 Pain in right shoulder: Secondary | ICD-10-CM

## 2014-08-20 DIAGNOSIS — Z4789 Encounter for other orthopedic aftercare: Secondary | ICD-10-CM | POA: Diagnosis not present

## 2014-08-20 NOTE — Therapy (Signed)
Occupational Therapy Treatment  Patient Details  Name: Peter Campbell MRN: [id number] Date of Birth: 05/29/1956  Encounter Date: 08/20/2014      OT End of Session - 08/20/14 1526    Visit Number 13   Number of Visits 24   Date for OT Re-Evaluation 08/27/14   Authorization Type Medicare part A primary, Medicaid Secondary   Authorization Time Period Before 28th visit   Authorization - Visit Number 13   Authorization - Number of Visits 28   OT Start Time 1437   OT Stop Time 1511   OT Time Calculation (min) 34 min   Activity Tolerance Patient tolerated treatment well   Behavior During Therapy WFL for tasks assessed/performed      Past Medical History  Diagnosis Date  . Essential hypertension, benign   . Chronic back pain   . Essential tremor   . Coronary atherosclerosis of native coronary artery     Dense  coronary atherosclerosis by chest CT November 2014   . History of chicken pox   . History of [place] measles   . Sciatica   . Collagen vascular disease   . Asthma     Past Surgical History  Procedure Laterality Date  . Colon surgery    . Abdominal surgery    . Tonsillectomy    . Circumcision    . Shoulder open rotator cuff repair Right 06/12/2014    Procedure: OPEN REPAIR ROTATOR CUFF RIGHT ;  Surgeon: Darreld McleanWayne Keeling, MD;  Location: [facility];  Service: Orthopedics;  Laterality: Right;  . Shoulder acromioplasty Right 06/12/2014    Procedure: RIGHT SHOULDER ACROMIOPLASTY;  Surgeon: Darreld McleanWayne Keeling, MD;  Location: [facility];  Service: Orthopedics;  Laterality: Right;    There were no vitals taken for this visit.  Visit Diagnosis:  Pain in joint, shoulder region, right  Shoulder weakness  Decreased right shoulder range of motion      Subjective Assessment - 08/20/14 1452    Symptoms S: I'm a little sore today. I almost feel feverish and I don't know if I'm coming down with something.    Currently in Pain? Yes   Pain Score 6    Pain Location Shoulder   Pain Orientation  Right   Pain Descriptors / Indicators Aching   Pain Type Acute pain          [facility] OT Assessment - 08/20/14 1449    Precautions   Precautions Shoulder   Type of Shoulder Precautions PROM for 4 weeks (07/30/14) AAROM for 4 weeks (07/31/14) then progress as tolerated.           OT Treatments/Exercises (OP) - 08/20/14 1449    Shoulder Exercises: Supine   Protraction PROM;10 reps;AAROM;15 reps   Horizontal ABduction PROM;10 reps;AAROM;15 reps   External Rotation PROM;10 reps;AAROM;15 reps   Internal Rotation PROM;10 reps;AAROM;15 reps   Flexion PROM;10 reps;AAROM;15 reps   ABduction PROM;10 reps;AAROM;15 reps   Shoulder Exercises: Standing   Protraction AAROM;12 reps   Horizontal ABduction AAROM;12 reps   External Rotation AAROM;12 reps   Internal Rotation AAROM;12 reps   Flexion AAROM;12 reps   ABduction AAROM;12 reps   Shoulder Exercises: Pulleys   Flexion 1 minute  abduction 1'   Shoulder Exercises: ROM/Strengthening   Wall Wash 1'   Proximal Shoulder Strengthening, Supine 10X            OT Short Term Goals - 08/20/14 1453    OT SHORT TERM GOAL #1   Title  Patient will  be educated on HEP.      Time 6   Period Weeks   Status On-going   OT SHORT TERM GOAL #2   Title Patient will decrease pain to 3/10 during daily tasks.      Time 6   Period Weeks   Status On-going   OT SHORT TERM GOAL #3   Title Patient will decrease fascial restrictions from max to mod fascial restrictions.    Time 6   Period Weeks   OT SHORT TERM GOAL #4   Title Patient will [facility] to [facility] to increase ability to donn upper body clothing.      Time 6   Period Weeks   Status On-going   OT SHORT TERM GOAL #5   Title Patient will increase strength to 3/5 to increase ability to operate manual gear shift.      Time 6   Period Weeks   Status On-going          OT Long Term Goals - 08/20/14 1453    OT LONG TERM GOAL #1   Title Patient will regain highest level of functioning and  independence in all daily tasks.      Time 12   Period Weeks   Status On-going   OT LONG TERM GOAL #2   Title Patient will decrease pain to 1/10 or less during daily tasks.      Time 12   Period Weeks   Status On-going   OT LONG TERM GOAL #3   Title Patient will decrease fascial restrictions from mod to min or less.      Time 12   Period Weeks   OT LONG TERM GOAL #4   Title Patient will increase AROM to WNL to increase ability to reach onto overhead shelves.      Time 12   Period Weeks   Status On-going   OT LONG TERM GOAL #5   Title Patient will increase strength to 4/5 to increase ability to play musical instruments.      Time 12   Period Weeks   Status On-going          Plan - 08/20/14 1527    Clinical Impression Statement A; Increased standing AAROM to 12X and added pulleys. patient tolerated well. Patient reports that he was a little more tender today during manual therapy.    Plan P: Cont with AAROM and follow protocol.        Problem List Patient Active Problem List   Diagnosis Date Noted  . Muscle weakness (generalized) 07/02/2014  . Decreased range of motion of right shoulder 07/02/2014  . Rotator cuff impingement syndrome of right shoulder 06/12/2014  . Coronary atherosclerosis of native coronary artery 09/26/2013  . Tobacco abuse 09/26/2013  . Essential hypertension, benign 09/25/2013      Limmie PatriciaLaura Telly Jawad, OTR/L,CBIS  [phone]  08/20/2014, 3:31 PM

## 2014-08-22 ENCOUNTER — Ambulatory Visit (HOSPITAL_COMMUNITY)
Admission: RE | Admit: 2014-08-22 | Discharge: 2014-08-22 | Disposition: A | Discharge status: Home / Self Care | Payer: Medicare Other | Source: Ambulatory Visit | Attending: Orthopaedic Surgery | Admitting: Orthopaedic Surgery

## 2014-08-22 ENCOUNTER — Encounter (HOSPITAL_COMMUNITY): Payer: Self-pay

## 2014-08-22 DIAGNOSIS — M25611 Stiffness of right shoulder, not elsewhere classified: Secondary | ICD-10-CM

## 2014-08-22 DIAGNOSIS — Z4789 Encounter for other orthopedic aftercare: Secondary | ICD-10-CM | POA: Diagnosis not present

## 2014-08-22 DIAGNOSIS — R29898 Other symptoms and signs involving the musculoskeletal system: Secondary | ICD-10-CM

## 2014-08-22 DIAGNOSIS — M25511 Pain in right shoulder: Secondary | ICD-10-CM

## 2014-08-22 NOTE — Therapy (Signed)
Occupational Therapy Treatment  Patient Details  Name: Peter Campbell A Kivett MRN: [id number] Date of Birth: 01/07/1956  Encounter Date: 08/22/2014      OT End of Session - 08/22/14 1422    Visit Number 14   Number of Visits 24   Date for OT Re-Evaluation 08/27/14   Authorization Type Medicare part A primary, Medicaid Secondary   Authorization Time Period Before 28th visit   Authorization - Visit Number 14   Authorization - Number of Visits 28   OT Start Time 1352   OT Stop Time 1415   OT Time Calculation (min) 23 min   Activity Tolerance Patient tolerated treatment well   Behavior During Therapy WFL for tasks assessed/performed      Past Medical History  Diagnosis Date  . Essential hypertension, benign   . Chronic back pain   . Essential tremor   . Coronary atherosclerosis of native coronary artery     Dense  coronary atherosclerosis by chest CT November 2014   . History of chicken pox   . History of [place]   . Sciatica   . Collagen vascular disease   . Asthma     Past Surgical History  Procedure Laterality Date  . Colon surgery    . Abdominal surgery    . Tonsillectomy    . Circumcision    . Shoulder open rotator cuff repair Right 06/12/2014    Procedure: OPEN REPAIR ROTATOR CUFF RIGHT ;  Surgeon: Darreld McleanWayne Keeling, MD;  Location: [facility];  Service: Orthopedics;  Laterality: Right;  . Shoulder acromioplasty Right 06/12/2014    Procedure: RIGHT SHOULDER ACROMIOPLASTY;  Surgeon: Darreld McleanWayne Keeling, MD;  Location: [facility];  Service: Orthopedics;  Laterality: Right;    There were no vitals taken for this visit.  Visit Diagnosis:  Pain in joint, shoulder region, right  Shoulder weakness  Decreased right shoulder range of motion      Subjective Assessment - 08/22/14 1351    Symptoms "i just got a call from my daughter - she said seh's on her way.  So can we cut it  little short today?"   Currently in Pain? Yes   Pain Score 5    Pain Location Shoulder   Pain Orientation  Right            OT Treatments/Exercises (OP) - 08/22/14 1353    Shoulder Exercises: Supine   Protraction PROM;10 reps   Horizontal ABduction PROM;10 reps   External Rotation PROM;10 reps   Internal Rotation PROM;10 reps   Flexion PROM;10 reps   ABduction PROM;10 reps   Shoulder Exercises: Standing   Protraction AAROM;15 reps   Horizontal ABduction AAROM;15 reps   External Rotation AAROM;15 reps   Internal Rotation AAROM;15 reps   Flexion AAROM;15 reps   ABduction AAROM;15 reps   Extension Theraband;10 reps   Theraband Level (Shoulder Extension) Level 2 (Red)   Row Theraband;10 reps   Theraband Level (Shoulder Row) Level 2 (Red)   Manual Therapy   Manual Therapy Myofascial release   Myofascial Release Myofascial release: MFR and manual stretching to RUE bicep, upper trap, pec, deltoid regions to decrease fascial restrictions and promote decreased pain               OT Short Term Goals - 08/22/14 1427    OT SHORT TERM GOAL #1   Title  Patient will be educated on HEP.      Status On-going   OT SHORT TERM GOAL #2  Title Patient will decrease pain to 3/10 during daily tasks.      Status On-going   OT SHORT TERM GOAL #3   Title Patient will decrease fascial restrictions from max to mod fascial restrictions.    Status Achieved   OT SHORT TERM GOAL #4   Title Patient will increase AROM to [facility] to increase ability to donn upper body clothing.      Status On-going   OT SHORT TERM GOAL #5   Title Patient will increase strength to 3/5 to increase ability to operate manual gear shift.      Status On-going          OT Long Term Goals - 08/22/14 1427    OT LONG TERM GOAL #1   Title Patient will regain highest level of functioning and independence in all daily tasks.      Status On-going   OT LONG TERM GOAL #2   Title Patient will decrease pain to 1/10 or less during daily tasks.      Status On-going   OT LONG TERM GOAL #3   Title Patient will decrease fascial  restrictions from mod to min or less.      Status Achieved   OT LONG TERM GOAL #4   Title Patient will increase AROM to WNL to increase ability to reach onto overhead shelves.      Status On-going   OT LONG TERM GOAL #5   Title Patient will increase strength to 4/5 to increase ability to play musical instruments.      Status On-going          Plan - 08/22/14 1423    Clinical Impression Statement pt arrived stating his daughter was nearly to his home for thanksgiving and requested to have short session.  Pt with some tenderness during manual therapy, but able to tolerate.  continued standing AAROM exercises.  Added red theraband this session due to increased tightness in scapular region.  pt tolerated well, with some discomfort.   Plan Continue with AAROM, beginning AROM as able on 12/3. continue scapular theraband.        Problem List Patient Active Problem List   Diagnosis Date Noted  . Muscle weakness (generalized) 07/02/2014  . Decreased range of motion of right shoulder 07/02/2014  . Rotator cuff impingement syndrome of right shoulder 06/12/2014  . Coronary atherosclerosis of native coronary artery 09/26/2013  . Tobacco abuse 09/26/2013  . Essential hypertension, benign 09/25/2013     Marry GuanMarie Rawlings Kiylee Thoreson, MS, OTR/L [facility] [phone] 08/22/2014, 2:29 PM

## 2014-08-29 ENCOUNTER — Ambulatory Visit (HOSPITAL_COMMUNITY)
Admission: RE | Admit: 2014-08-29 | Discharge: 2014-08-29 | Disposition: A | Discharge status: Home / Self Care | Payer: Medicare Other | Source: Ambulatory Visit | Attending: Physician Assistant | Admitting: Physician Assistant

## 2014-08-29 DIAGNOSIS — M25511 Pain in right shoulder: Secondary | ICD-10-CM | POA: Diagnosis not present

## 2014-08-29 DIAGNOSIS — Z4789 Encounter for other orthopedic aftercare: Secondary | ICD-10-CM | POA: Insufficient documentation

## 2014-08-29 DIAGNOSIS — M6281 Muscle weakness (generalized): Secondary | ICD-10-CM | POA: Diagnosis not present

## 2014-08-29 DIAGNOSIS — R29898 Other symptoms and signs involving the musculoskeletal system: Secondary | ICD-10-CM

## 2014-08-29 DIAGNOSIS — M25611 Stiffness of right shoulder, not elsewhere classified: Secondary | ICD-10-CM | POA: Diagnosis not present

## 2014-08-29 NOTE — Therapy (Signed)
[facility] [address] [place], [place], [address] Phone: [phone]   Fax:  [phone]  Occupational Therapy Reassessment and Treatment  Patient Details  Name: Peter Campbell MRN: [id number] Date of Birth: 12-09-1955  Encounter Date: 08/29/2014      OT End of Session - 08/29/14 1211    Visit Number 15   Number of Visits 24   Date for OT Re-Evaluation 09/26/14   Authorization Type Medicare part A primary, Medicaid Secondary   Authorization Time Period Before 25th visit   Authorization - Visit Number 15   Authorization - Number of Visits 25   OT Start Time 1107   OT Stop Time 1200   OT Time Calculation (min) 53 min   Activity Tolerance Patient tolerated treatment well   Behavior During Therapy [facility] for tasks assessed/performed      Past Medical History  Diagnosis Date  . Essential hypertension, benign   . Chronic back pain   . Essential tremor   . Coronary atherosclerosis of native coronary artery     Dense  coronary atherosclerosis by chest CT November 2014   . History of chicken pox   . History of [place] measles   . Sciatica   . Collagen vascular disease   . Asthma     Past Surgical History  Procedure Laterality Date  . Colon surgery    . Abdominal surgery    . Tonsillectomy    . Circumcision    . Shoulder open rotator cuff repair Right 06/12/2014    Procedure: OPEN REPAIR ROTATOR CUFF RIGHT ;  Surgeon: [name], MD;  Location: [facility];  Service: Orthopedics;  Laterality: Right;  . Shoulder acromioplasty Right 06/12/2014    Procedure: RIGHT SHOULDER ACROMIOPLASTY;  Surgeon: [name], MD;  Location: [facility];  Service: Orthopedics;  Laterality: Right;    There were no vitals taken for this visit.  Visit Diagnosis:  Pain in joint, shoulder region, right  Shoulder weakness  Decreased right shoulder range of motion  Muscle weakness (generalized)      Subjective Assessment - 08/29/14 1206    Symptoms S: I went and  saw the doctor yesteray and he said to continue with therapy.   Currently in Pain? Yes   Pain Score 5    Pain Location Shoulder   Pain Orientation Right   Pain Descriptors / Indicators Aching   Pain Type Acute pain          [facility] OT Assessment - 08/29/14 1121    Assessment   Diagnosis s/p right rotator cuff repair   Precautions   Precautions Shoulder   Type of Shoulder Precautions Progress as tolerated.   AROM   Right Shoulder Flexion --  161/135 (last progress note: 136 supine)   Right Shoulder ABduction --  175/170 (last progress note: 116 supine)   Right Shoulder Internal Rotation --  90/90 (same at last progress note)   Right Shoulder External Rotation --  56/60 (on last progress note: 22 supine)   PROM   Right Shoulder Flexion 170 Degrees   Right Shoulder ABduction 180 Degrees   Right Shoulder Internal Rotation 90 Degrees   Right Shoulder External Rotation 56 Degrees   Strength   Right Shoulder Flexion --  4+/5   Right Shoulder ABduction --  4+/5   Right Shoulder Internal Rotation 4/5   Right Shoulder External Rotation --  4+/5          OT Treatments/Exercises (OP) - 08/29/14 1217  Shoulder Exercises: Supine   Protraction PROM;5 reps   Horizontal ABduction PROM;5 reps   External Rotation PROM;5 reps   Internal Rotation PROM;5 reps   Flexion PROM;5 reps   ABduction PROM;5 reps   Modalities   Modalities Moist Heat   Moist Heat Therapy   Number Minutes Moist Heat 10 Minutes   Moist Heat Location Shoulder  right   Manual Therapy   Manual Therapy Myofascial release   Myofascial Release Muscle energy technique used with right medial deltoid to relax tone and improve range of motion. Spinal lateral flexion stretch completed to increase right shoulder range of motion.          OT Education - 08/29/14 1209    Education provided Yes   Education Details Spinal lateral flexion stretch for right side obliques.    Person(s) Educated Patient   Methods  Explanation;Demonstration   Comprehension Verbalized understanding;Returned demonstration          OT Short Term Goals - 08/29/14 1141    OT SHORT TERM GOAL #1   Title  Patient will be educated on HEP.      Status Achieved   OT SHORT TERM GOAL #2   Title Patient will decrease pain to 3/10 during daily tasks.      Status Achieved   OT SHORT TERM GOAL #3   Title Patient will decrease fascial restrictions from max to mod fascial restrictions.    Status Achieved   OT SHORT TERM GOAL #4   Title Patient will increase AROM to [facility] to increase ability to donn upper body clothing.      Status Achieved   OT SHORT TERM GOAL #5   Title Patient will increase strength to 3/5 to increase ability to operate manual gear shift.      Status Achieved          OT Long Term Goals - 08/29/14 1146    OT LONG TERM GOAL #1   Title Patient will regain highest level of functioning and independence in all daily tasks.      Status On-going   OT LONG TERM GOAL #2   Title Patient will decrease pain to 1/10 or less during daily tasks.      Status On-going   OT LONG TERM GOAL #3   Title Patient will decrease fascial restrictions from mod to min or less.      Status Achieved   OT LONG TERM GOAL #4   Title Patient will increase AROM to WNL to increase ability to reach onto overhead shelves.      Status On-going   OT LONG TERM GOAL #5   Title Patient will increase strength to 5/5 to increase ability to play musical instruments.      Status Revised          Plan - 08/29/14 1212    Clinical Impression Statement A: Reassessment completed this date. patient has met 5/5 STGs and 1/5LTGs. Long term goal related to strength revised from 4/5 strength to 5/5 strength. Patient reports that he is doing a lot with his shoulder including gardening and using it as his dominant extremity. Patient reports that he continues to feel tight during ROM and his  strength isn't 100%. Educated patient on spinal lateral flexion  stretch to right side obliques motion.    Plan P: Cont 2x a week for 4 more weeks. Focus on strengthening with AROM exercises initially and progressing to 1#.  G-Codes - 08/29/14 1221    Functional Assessment Tool Used FOTO score: 69/100 (31% impaired)   Functional Limitation Carrying, moving and handling objects   Carrying, Moving and Handling Objects Current Status [phone]) At least 20 percent but less than 40 percent impaired, limited or restricted   Carrying, Moving and Handling Objects Goal Status ([id number]) At least 20 percent but less than 40 percent impaired, limited or restricted           Problem List Patient Active Problem List   Diagnosis Date Noted  . Muscle weakness (generalized) 07/02/2014  . Decreased range of motion of right shoulder 07/02/2014  . Rotator cuff impingement syndrome of right shoulder 06/12/2014  . Coronary atherosclerosis of native coronary artery 09/26/2013  . Tobacco abuse 09/26/2013  . Essential hypertension, benign 09/25/2013    Ailene Ravel, OTR/L,CBIS  [phone]  08/29/2014, 12:24 PM

## 2014-08-31 ENCOUNTER — Encounter (HOSPITAL_COMMUNITY): Payer: Self-pay

## 2014-08-31 ENCOUNTER — Ambulatory Visit (HOSPITAL_COMMUNITY)
Admission: RE | Admit: 2014-08-31 | Discharge: 2014-08-31 | Disposition: A | Discharge status: Home / Self Care | Payer: Medicare Other | Source: Ambulatory Visit | Attending: Physician Assistant | Admitting: Physician Assistant

## 2014-08-31 DIAGNOSIS — M25611 Stiffness of right shoulder, not elsewhere classified: Secondary | ICD-10-CM

## 2014-08-31 DIAGNOSIS — R29898 Other symptoms and signs involving the musculoskeletal system: Secondary | ICD-10-CM

## 2014-08-31 DIAGNOSIS — M6281 Muscle weakness (generalized): Secondary | ICD-10-CM

## 2014-08-31 DIAGNOSIS — Z4789 Encounter for other orthopedic aftercare: Secondary | ICD-10-CM | POA: Diagnosis not present

## 2014-08-31 DIAGNOSIS — M25511 Pain in right shoulder: Secondary | ICD-10-CM

## 2014-08-31 NOTE — Therapy (Signed)
[facility] [address] [place], [place], [address] Phone: [phone]   Fax:  [phone]  Occupational Therapy Treatment  Patient Details  Name: Peter BruceRicky A Campbell MRN: [id number] Date of Birth: 03/18/1956  Encounter Date: 08/31/2014      OT End of Session - 08/31/14 1451    Visit Number 16   Number of Visits 24   Date for OT Re-Evaluation 09/26/14   Authorization Type Medicare part A primary, Medicaid Secondary   Authorization Time Period Before 25th visit   Authorization - Visit Number 16   Authorization - Number of Visits 25   OT Start Time 1304   OT Stop Time 1344   OT Time Calculation (min) 40 min   Activity Tolerance Patient tolerated treatment well   Behavior During Therapy [facility] for tasks assessed/performed      Past Medical History  Diagnosis Date  . Essential hypertension, benign   . Chronic back pain   . Essential tremor   . Coronary atherosclerosis of native coronary artery     Dense  coronary atherosclerosis by chest CT November 2014   . History of chicken pox   . History of [place]   . Sciatica   . Collagen vascular disease   . Asthma     Past Surgical History  Procedure Laterality Date  . Colon surgery    . Abdominal surgery    . Tonsillectomy    . Circumcision    . Shoulder open rotator cuff repair Right 06/12/2014    Procedure: OPEN REPAIR ROTATOR CUFF RIGHT ;  Surgeon: Darreld McleanWayne Keeling, MD;  Location: [facility];  Service: Orthopedics;  Laterality: Right;  . Shoulder acromioplasty Right 06/12/2014    Procedure: RIGHT SHOULDER ACROMIOPLASTY;  Surgeon: Darreld McleanWayne Keeling, MD;  Location: [facility];  Service: Orthopedics;  Laterality: Right;    There were no vitals taken for this visit.  Visit Diagnosis:  Pain in joint, shoulder region, right  Shoulder weakness  Decreased right shoulder range of motion  Muscle weakness (generalized)      Subjective Assessment - 08/31/14 1303    Symptoms "Its been hurting - i've been  working in the garden. and i haven't been sleeping well."   Limitations PROM for 4 weeks (07/30/14) AAROM for 4 weeks (08/27/14) then progress as tolerated.    Currently in Pain? Yes   Pain Score 4    Pain Location Shoulder   Pain Orientation Right   Pain Type Acute pain            OT Treatments/Exercises (OP) - 08/31/14 1306    Shoulder Exercises: Supine   Protraction PROM;5 reps;AROM;12 reps   Horizontal ABduction PROM;5 reps;AROM;12 reps   External Rotation PROM;5 reps;AROM;12 reps   Internal Rotation PROM;5 reps;AROM;12 reps   Flexion PROM;5 reps;AROM;12 reps   ABduction PROM;5 reps;AROM;12 reps   Shoulder Exercises: Standing   Protraction AROM;15 reps   Horizontal ABduction AROM;10 reps   External Rotation AROM;15 reps   Internal Rotation AROM;15 reps   Flexion AROM;15 reps   ABduction AROM;15 reps   Extension Theraband;15 reps   Theraband Level (Shoulder Extension) Level 2 (Red)   Row Theraband;15 reps   Theraband Level (Shoulder Row) Level 2 (Red)   Retraction Theraband;15 reps   Theraband Level (Shoulder Retraction) Level 2 (Red)   Shoulder Exercises: ROM/Strengthening   Wall Wash 1'   Proximal Shoulder Strengthening, Supine 12x AROM   Proximal Shoulder Strengthening, Seated 10x AROM   Manual Therapy  Manual Therapy Myofascial release   Myofascial Release Myofascial release: MFR and manual stretching to RUE bicep, upper trap, pec, deltoid regions to decrease fascial restrictions and promote decreased pain               OT Short Term Goals - 08/31/14 1329    OT SHORT TERM GOAL #1   Title  Patient will be educated on HEP.      Status Achieved   OT SHORT TERM GOAL #2   Title Patient will decrease pain to 3/10 during daily tasks.      Status Achieved   OT SHORT TERM GOAL #3   Title Patient will decrease fascial restrictions from max to mod fascial restrictions.    Status Achieved   OT SHORT TERM GOAL #4   Title Patient will increase AROM to [facility] to  increase ability to donn upper body clothing.      Status Achieved   OT SHORT TERM GOAL #5   Title Patient will increase strength to 3/5 to increase ability to operate manual gear shift.      Status Achieved          OT Long Term Goals - 08/31/14 1330    OT LONG TERM GOAL #1   Title Patient will regain highest level of functioning and independence in all daily tasks.      Status On-going   OT LONG TERM GOAL #2   Title Patient will decrease pain to 1/10 or less during daily tasks.      Status On-going   OT LONG TERM GOAL #3   Title Patient will decrease fascial restrictions from mod to min or less.      Status Achieved   OT LONG TERM GOAL #4   Title Patient will increase AROM to WNL to increase ability to reach onto overhead shelves.      Status On-going   OT LONG TERM GOAL #5   Title Patient will increase strength to 5/5 to increase ability to play musical instruments.      Status On-going          Plan - 08/31/14 1451    Clinical Impression Statement Initiated supine AROM this session. pt toelrated well, with some increawed pain with end range motions.  Pt also tolerated well standing AROM.  OTR requested 10 reps - pt completed 15 (for all but horizontal abduction - pt indiacted 10 was enough). pt indicated pain was tolerable.  Contniued wall wash, with good tolerance. Increawed red theraband reps, and added scapular retraction with good form.     Plan P: continue 15 reps AROM supine and standing.  Add x-v and w arms. Add AROM to HEP.           Problem List Patient Active Problem List   Diagnosis Date Noted  . Muscle weakness (generalized) 07/02/2014  . Decreased range of motion of right shoulder 07/02/2014  . Rotator cuff impingement syndrome of right shoulder 06/12/2014  . Coronary atherosclerosis of native coronary artery 09/26/2013  . Tobacco abuse 09/26/2013  . Essential hypertension, benign 09/25/2013    Marry GuanMarie Rawlings Tahni Porchia, MS, OTR/L [facility]  [facility] [phone] 08/31/2014, 2:53 PM

## 2014-09-04 ENCOUNTER — Encounter (HOSPITAL_COMMUNITY): Payer: Self-pay

## 2014-09-04 ENCOUNTER — Ambulatory Visit (HOSPITAL_COMMUNITY)
Admission: RE | Admit: 2014-09-04 | Discharge: 2014-09-04 | Disposition: A | Discharge status: Home / Self Care | Payer: Medicare Other | Source: Ambulatory Visit | Attending: Physician Assistant | Admitting: Physician Assistant

## 2014-09-04 DIAGNOSIS — M25611 Stiffness of right shoulder, not elsewhere classified: Secondary | ICD-10-CM

## 2014-09-04 DIAGNOSIS — R29898 Other symptoms and signs involving the musculoskeletal system: Secondary | ICD-10-CM

## 2014-09-04 DIAGNOSIS — Z4789 Encounter for other orthopedic aftercare: Secondary | ICD-10-CM | POA: Diagnosis not present

## 2014-09-04 DIAGNOSIS — M25511 Pain in right shoulder: Secondary | ICD-10-CM

## 2014-09-04 NOTE — Patient Instructions (Signed)
ROM: Abduction (Standing)   Bring arms straight out from sides and raise as high as possible without pain. Repeat __15__ times per set. Do __1__ sets per session. Do _2___ sessions per day.  http://orth.exer.us/910   Copyright  VHI. All rights reserved.   Extension (Active) ROM: Extension (Standing)   Bring arms straight back as far as possible without pain. Repeat _15___ times per set. Do __1__ sets per session. Do __2__ sessions per day.  http://orth.exer.us/916   Copyright  VHI. All rights reserved.   ROM: External / Internal Rotation - in Abduction (Standing)   With upper arms parallel to floor and elbows bent at right angles, gently rotate arms up then down as far as possible without pain. Repeat __15__ times per set. Do _1___ sets per session. Do __2__ sessions per day.  http://orth.exer.us/912   Copyright  VHI. All rights reserved.    Flexors Stretch (Active)   Stand, arms straight at sides. Bring arms straight forward and upward as high as possible without pain. Hold _5__ seconds. Repeat _15__ times per session. Do __2_ sessions per day.  Copyright  VHI. All rights reserved.   Scapular Retraction (Standing)   With arms at sides, pinch shoulder blades together. Repeat __15__ times per set. Do _1___ sets per session. Do __2__ sessions per day.  http://orth.exer.us/944   Copyright  VHI. All rights reserved.

## 2014-09-04 NOTE — Therapy (Signed)
[facility] [address] [place], [place], [address] Phone: [phone]   Fax:  [phone]  Occupational Therapy Treatment  Patient Details  Name: Peter Campbell MRN: [id number] Date of Birth: 04/19/1956  Encounter Date: 09/04/2014      OT End of Session - 09/04/14 1139    Visit Number 17   Number of Visits 24   Date for OT Re-Evaluation 09/26/14   Authorization Type Medicare part A primary, Medicaid Secondary   Authorization Time Period Before 25th visit   Authorization - Visit Number 17   Authorization - Number of Visits 25   OT Start Time [phone]   OT Stop Time 1015   OT Time Calculation (min) 36 min   Activity Tolerance Patient tolerated treatment well   Behavior During Therapy [facility] for tasks assessed/performed      Past Medical History  Diagnosis Date  . Essential hypertension, benign   . Chronic back pain   . Essential tremor   . Coronary atherosclerosis of native coronary artery     Dense  coronary atherosclerosis by chest CT November 2014   . History of chicken pox   . History of [place] measles   . Sciatica   . Collagen vascular disease   . Asthma     Past Surgical History  Procedure Laterality Date  . Colon surgery    . Abdominal surgery    . Tonsillectomy    . Circumcision    . Shoulder open rotator cuff repair Right 06/12/2014    Procedure: OPEN REPAIR ROTATOR CUFF RIGHT ;  Surgeon: Darreld McleanWayne Keeling, MD;  Location: [facility];  Service: Orthopedics;  Laterality: Right;  . Shoulder acromioplasty Right 06/12/2014    Procedure: RIGHT SHOULDER ACROMIOPLASTY;  Surgeon: Darreld McleanWayne Keeling, MD;  Location: [facility];  Service: Orthopedics;  Laterality: Right;    There were no vitals taken for this visit.  Visit Diagnosis:  Pain in joint, shoulder region, right  Shoulder weakness  Decreased right shoulder range of motion      Subjective Assessment - 09/04/14 0955    Symptoms S:I slept the best I have ever slept since my shoulder injury last  night.    Currently in Pain? No/denies          [facility] - 09/04/14 0956    Precautions   Precautions Shoulder   Type of Shoulder Precautions Progress as tolerated.          OT Treatments/Exercises (OP) - 09/04/14 0956    Shoulder Exercises: Supine   Protraction PROM;5 reps;AROM;12 reps   Horizontal ABduction PROM;5 reps;AROM;12 reps   External Rotation PROM;5 reps;AROM;12 reps   Internal Rotation PROM;5 reps;AROM;12 reps   Flexion PROM;5 reps;AROM;12 reps   ABduction PROM;5 reps;AROM;12 reps   Shoulder Exercises: Standing   Protraction AROM;15 reps   Horizontal ABduction AROM;15 reps   External Rotation AROM;15 reps   Internal Rotation AROM;15 reps   Flexion AROM;15 reps   ABduction AROM;15 reps   Shoulder Exercises: ROM/Strengthening   Proximal Shoulder Strengthening, Supine 15 no rest breaks   Proximal Shoulder Strengthening, Seated 12X no rest breaks   Manual Therapy   Manual Therapy Myofascial release   Myofascial Release Muscle energy technique used with right medial deltoid to relax tone and improve range of motion.Marland Kitchen.Positional release completed to right anterior and miedial deltoid to reduce trigger point pain and relax muscle.           OT Education - 09/04/14 1018  Education provided Yes   Education Details AROM exercises   Person(s) Educated Patient   Methods Explanation;Demonstration   Comprehension Verbalized understanding          OT Short Term Goals - 09/04/14 0958    OT SHORT TERM GOAL #1   Title  Patient will be educated on HEP.      OT SHORT TERM GOAL #2   Title Patient will decrease pain to 3/10 during daily tasks.      OT SHORT TERM GOAL #3   Title Patient will decrease fascial restrictions from max to mod fascial restrictions.    OT SHORT TERM GOAL #4   Title Patient will increase AROM to [facility] to increase ability to donn upper body clothing.      OT SHORT TERM GOAL #5   Title Patient will increase strength to 3/5 to  increase ability to operate manual gear shift.             OT Long Term Goals - 09/04/14 [id number]    OT LONG TERM GOAL #1   Title Patient will regain highest level of functioning and independence in all daily tasks.      Status On-going   OT LONG TERM GOAL #2   Title Patient will decrease pain to 1/10 or less during daily tasks.      Status On-going   OT LONG TERM GOAL #3   Title Patient will decrease fascial restrictions from mod to min or less.      OT LONG TERM GOAL #4   Title Patient will increase AROM to WNL to increase ability to reach onto overhead shelves.      Status On-going   OT LONG TERM GOAL #5   Title Patient will increase strength to 5/5 to increase ability to play musical instruments.      Status On-going          Plan - 09/04/14 1140    Clinical Impression Statement A: Continued with 15 reps in supine and standing. DId not add X to V arms and W arms due to time constraint. Added AROM to HEP.    Plan P: Add X to V arms and W arms.           Problem List Patient Active Problem List   Diagnosis Date Noted  . Muscle weakness (generalized) 07/02/2014  . Decreased range of motion of right shoulder 07/02/2014  . Rotator cuff impingement syndrome of right shoulder 06/12/2014  . Coronary atherosclerosis of native coronary artery 09/26/2013  . Tobacco abuse 09/26/2013  . Essential hypertension, benign 09/25/2013    Limmie PatriciaLaura Magnolia Mattila, OTR/L,CBIS  [phone]  09/04/2014, 11:41 AM

## 2014-09-07 ENCOUNTER — Ambulatory Visit (HOSPITAL_COMMUNITY): Payer: Medicare Other | Admitting: Specialist

## 2014-09-11 ENCOUNTER — Encounter (HOSPITAL_COMMUNITY): Payer: Self-pay

## 2014-09-11 ENCOUNTER — Ambulatory Visit (HOSPITAL_COMMUNITY)
Admission: RE | Admit: 2014-09-11 | Discharge: 2014-09-11 | Disposition: A | Discharge status: Home / Self Care | Payer: Medicare Other | Source: Ambulatory Visit | Attending: Physician Assistant | Admitting: Physician Assistant

## 2014-09-11 DIAGNOSIS — Z4789 Encounter for other orthopedic aftercare: Secondary | ICD-10-CM | POA: Diagnosis not present

## 2014-09-11 DIAGNOSIS — M25511 Pain in right shoulder: Secondary | ICD-10-CM

## 2014-09-11 DIAGNOSIS — M25611 Stiffness of right shoulder, not elsewhere classified: Secondary | ICD-10-CM

## 2014-09-11 DIAGNOSIS — R29898 Other symptoms and signs involving the musculoskeletal system: Secondary | ICD-10-CM

## 2014-09-11 NOTE — Therapy (Signed)
[facility] [address] , [place], [address] Phone: [phone]   Fax:  [phone]  Occupational Therapy Treatment  Patient Details  Name: Peter BruceRicky A Vanengen MRN: [id number] Date of Birth: 05/20/1956  Encounter Date: 09/11/2014      OT End of Session - 09/11/14 1649    Visit Number 18   Number of Visits 24   Date for OT Re-Evaluation 09/26/14   Authorization Type Medicare part A primary, Medicaid Secondary   Authorization Time Period Before 25th visit   Authorization - Visit Number 18   Authorization - Number of Visits 25   OT Start Time 1345   OT Stop Time 1430   OT Time Calculation (min) 45 min   Activity Tolerance Patient tolerated treatment well   Behavior During Therapy [facility] for tasks assessed/performed      Past Medical History  Diagnosis Date  . Essential hypertension, benign   . Chronic back pain   . Essential tremor   . Coronary atherosclerosis of native coronary artery     Dense  coronary atherosclerosis by chest CT November 2014   . History of chicken pox   . History of [place] measles   . Sciatica   . Collagen vascular disease   . Asthma     Past Surgical History  Procedure Laterality Date  . Colon surgery    . Abdominal surgery    . Tonsillectomy    . Circumcision    . Shoulder open rotator cuff repair Right 06/12/2014    Procedure: OPEN REPAIR ROTATOR CUFF RIGHT ;  Surgeon: Darreld McleanWayne Keeling, MD;  Location: [facility];  Service: Orthopedics;  Laterality: Right;  . Shoulder acromioplasty Right 06/12/2014    Procedure: RIGHT SHOULDER ACROMIOPLASTY;  Surgeon: Darreld McleanWayne Keeling, MD;  Location: [facility];  Service: Orthopedics;  Laterality: Right;    There were no vitals taken for this visit.  Visit Diagnosis:  Pain in joint, shoulder region, right  Shoulder weakness  Decreased right shoulder range of motion      Subjective Assessment - 09/11/14 1347    Symptoms "Its not hurting all over, just in spots."   Currently in Pain?  Yes   Pain Score 4    Pain Location Shoulder   Pain Orientation Right   Pain Descriptors / Indicators Aching   Pain Type Acute pain          [facility] OT Assessment - 09/11/14 1349    Precautions   Precautions Shoulder   Type of Shoulder Precautions Progress as tolerated.          OT Treatments/Exercises (OP) - 09/11/14 1349    Shoulder Exercises: Supine   Protraction PROM;5 reps;AROM;15 reps   Horizontal ABduction PROM;5 reps;AROM;15 reps   External Rotation PROM;5 reps;AROM;15 reps   Internal Rotation PROM;5 reps;AROM;15 reps   Flexion PROM;5 reps;AROM;15 reps   ABduction PROM;5 reps;AROM;15 reps   Shoulder Exercises: Standing   Protraction AROM;15 reps   Horizontal ABduction AROM;15 reps   External Rotation AROM;15 reps   Internal Rotation AROM;15 reps   Flexion AROM;15 reps   ABduction AROM;15 reps   Extension Theraband;15 reps   Theraband Level (Shoulder Extension) Level 2 (Red)   Row Theraband;15 reps   Theraband Level (Shoulder Row) Level 2 (Red)   Retraction Theraband;15 reps   Theraband Level (Shoulder Retraction) Level 2 (Red)   Shoulder Exercises: ROM/Strengthening   "W" Arms 10x   X to V Arms 10x   Proximal Shoulder Strengthening, Supine 20 reps  each with no rest breaks   Proximal Shoulder Strengthening, Seated 15x with no rest breaks   Manual Therapy   Manual Therapy Myofascial release   Myofascial Release Myofascial release: MFR and manual stretching to RUE bicep, upper trap, pec, deltoid regions to decrease fascial restrictions and promote decreased pain.              OT Short Term Goals - 09/11/14 1408    OT SHORT TERM GOAL #1   Title  Patient will be educated on HEP.             OT Long Term Goals - 09/11/14 1408    OT LONG TERM GOAL #1   Title Patient will regain highest level of functioning and independence in all daily tasks.      Status On-going   OT LONG TERM GOAL #2   Title Patient will decrease pain to 1/10 or less during daily  tasks.      Status On-going   OT LONG TERM GOAL #3   Title Patient will decrease fascial restrictions from mod to min or less.      OT LONG TERM GOAL #4   Title Patient will increase AROM to WNL to increase ability to reach onto overhead shelves.      Status On-going   OT LONG TERM GOAL #5   Title Patient will increase strength to 5/5 to increase ability to play musical instruments.      Status On-going          Plan - 09/11/14 1649    Clinical Impression Statement Increased supine AROM reps with some discomfort, but good tolerance.  Pt tolerated well standing AROM reps. Added x-v and w arms, with some fatigue but good tolernace.  Pt continue to tolerate well theraband.     Plan P: attempt 1# supine.  increase to green theraband.        Problem List Patient Active Problem List   Diagnosis Date Noted  . Muscle weakness (generalized) 07/02/2014  . Decreased range of motion of right shoulder 07/02/2014  . Rotator cuff impingement syndrome of right shoulder 06/12/2014  . Coronary atherosclerosis of native coronary artery 09/26/2013  . Tobacco abuse 09/26/2013  . Essential hypertension, benign 09/25/2013   Marry GuanMarie Rawlings Krystn Dermody, MS, OTR/L [facility] [phone] 09/11/2014, 4:50 PM

## 2014-09-13 ENCOUNTER — Ambulatory Visit (HOSPITAL_COMMUNITY): Payer: Medicare Other

## 2014-09-17 ENCOUNTER — Encounter (HOSPITAL_COMMUNITY): Payer: Medicare Other | Admitting: Specialist

## 2014-09-18 ENCOUNTER — Ambulatory Visit (HOSPITAL_COMMUNITY)
Admission: RE | Admit: 2014-09-18 | Discharge: 2014-09-18 | Disposition: A | Discharge status: Home / Self Care | Payer: Medicare Other | Source: Ambulatory Visit | Attending: Physician Assistant | Admitting: Physician Assistant

## 2014-09-18 ENCOUNTER — Encounter (HOSPITAL_COMMUNITY): Payer: Self-pay

## 2014-09-18 DIAGNOSIS — M25611 Stiffness of right shoulder, not elsewhere classified: Secondary | ICD-10-CM

## 2014-09-18 DIAGNOSIS — M25511 Pain in right shoulder: Secondary | ICD-10-CM

## 2014-09-18 DIAGNOSIS — R29898 Other symptoms and signs involving the musculoskeletal system: Secondary | ICD-10-CM

## 2014-09-18 DIAGNOSIS — Z4789 Encounter for other orthopedic aftercare: Secondary | ICD-10-CM | POA: Diagnosis not present

## 2014-09-18 NOTE — Therapy (Signed)
[place], [place], [address] Phone: [phone]   Fax:  [phone]  Occupational Therapy Reassessment and Treatment  Patient Details  Name: Peter Campbell MRN: [id number] Date of Birth: [birth year]  Encounter Date: 09/18/2014      OT End of Session - 09/18/14 1351    Visit Number 19   Number of Visits 24   Authorization Type Medicare part A primary, Medicaid Secondary   Authorization Time Period Before 25th visit   Authorization - Visit Number 19   Authorization - Number of Visits 25   OT Start Time 1308   OT Stop Time 1349   OT Time Calculation (min) 41 min   Activity Tolerance Patient tolerated treatment well   Behavior During Therapy [facility] for tasks assessed/performed      Past Medical History  Diagnosis Date  . Essential hypertension, benign   . Chronic back pain   . Essential tremor   . Coronary atherosclerosis of native coronary artery     Dense  coronary atherosclerosis by chest CT November 2014   . History of chicken pox   . History of [place] measles   . Sciatica   . Collagen vascular disease   . Asthma     Past Surgical History  Procedure Laterality Date  . Colon surgery    . Abdominal surgery    . Tonsillectomy    . Circumcision    . Shoulder open rotator cuff repair Right 06/12/2014    Procedure: OPEN REPAIR ROTATOR CUFF RIGHT ;  Surgeon: [name], MD;  Location: [facility];  Service: Orthopedics;  Laterality: Right;  . Shoulder acromioplasty Right 06/12/2014    Procedure: RIGHT SHOULDER ACROMIOPLASTY;  Surgeon: [name], MD;  Location: [facility];  Service: Orthopedics;  Laterality: Right;    There were no vitals taken for this visit.  Visit Diagnosis:  Pain in joint, shoulder region, right  Shoulder weakness  Decreased right shoulder range of motion      Subjective Assessment - 09/18/14 1308    Symptoms S: I have one spot that it hurts a little.   Limitations PROM for 4  weeks (07/30/14) AAROM for 4 weeks (08/27/14) then progress as tolerated.    Currently in Pain? Yes   Pain Score 2    Pain Location Shoulder   Pain Orientation Right   Pain Descriptors / Indicators Aching   Pain Type Acute pain          [facility] OT Assessment - 09/18/14 1319    Assessment   Diagnosis s/p right rotator cuff repair   Precautions   Precautions Shoulder   Type of Shoulder Precautions Progress as tolerated.   Observation/Other Assessments   Observations Min fascial restrictions in right anterior deltoid region.    AROM   Right Shoulder Flexion 165 Degrees  last progress note: 135    Right Shoulder ABduction 171 Degrees  last progress note: 170   Right Shoulder Internal Rotation 90 Degrees  last progress note: 90   Right Shoulder External Rotation 75 Degrees  last progress note: 60   Strength   Right Shoulder Flexion 5/5  last progress note: 4+/5   Right Shoulder ABduction 5/5  last progress note: 4+/5   Right Shoulder Internal Rotation 5/5  last progress note: 4/5   Right Shoulder External Rotation 5/5  last progress note: 4+/5                OT Treatments/Exercises (OP) -  2014/10/03 1319    Shoulder Exercises: Supine   Protraction PROM;5 reps;Strengthening;20 reps;Weights   Protraction Weight (lbs) 1   Horizontal ABduction PROM;5 reps;Strengthening;Weights;20 reps   Horizontal ABduction Weight (lbs) 1   External Rotation PROM;5 reps;Strengthening;Weights;20 reps   External Rotation Weight (lbs) 1   Internal Rotation PROM;5 reps;Strengthening;Weights;20 reps   Internal Rotation Weight (lbs) 1   Flexion PROM;5 reps;Strengthening;Weights;20 reps   Shoulder Flexion Weight (lbs) 1   ABduction PROM;5 reps;Strengthening;Weights;20 reps   Shoulder ABduction Weight (lbs) 1   Manual Therapy   Manual Therapy Myofascial release   Myofascial Release Positional release completed to right anterior deltoid to reduce trigger point pain and relax muscle.                   OT Education - 10/03/2014 1350    Education provided Yes   Education Details Green theraband exercises   Person(s) Educated Patient   Methods Explanation;Demonstration;Handout   Comprehension Verbalized understanding          OT Short Term Goals - October 03, 2014 1331    OT SHORT TERM GOAL #1   Title  Patient will be educated on HEP.      OT SHORT TERM GOAL #2   Title Patient will decrease pain to 3/10 during daily tasks.      OT SHORT TERM GOAL #3   Title Patient will decrease fascial restrictions from max to mod fascial restrictions.    OT SHORT TERM GOAL #4   Title Patient will increase AROM to [facility] to increase ability to donn upper body clothing.      OT SHORT TERM GOAL #5   Title Patient will increase strength to 3/5 to increase ability to operate manual gear shift.              OT Long Term Goals - 10/03/14 1331    OT LONG TERM GOAL #1   Title Patient will regain highest level of functioning and independence in all daily tasks.      Status Achieved   OT LONG TERM GOAL #2   Title Patient will decrease pain to 1/10 or less during daily tasks.      Status Not Met   OT LONG TERM GOAL #3   Title Patient will decrease fascial restrictions from mod to min or less.      OT LONG TERM GOAL #4   Title Patient will increase AROM to WNL to increase ability to reach onto overhead shelves.      Status Achieved   OT LONG TERM GOAL #5   Title Patient will increase strength to 5/5 to increase ability to play musical instruments.      Status Achieved               Plan - October 03, 2014 1351    Clinical Impression Statement A: Reassessment and discharge completed this date. patient met all STGs and 4/5 LTGs. Patient has returned to all normal daily tasks. Patient only feels pain once in a while during daily tasks. Patient feels comfortable completing a HEP strengthening program independently.    Plan P: D/C from therapy.           G-Codes - 10/03/2014 1353     Functional Assessment Tool Used FOTO score: 75/100 (25% impaired)   Functional Limitation Carrying, moving and handling objects   Carrying, Moving and Handling Objects Goal Status ([id number]) At least 20 percent but less than 40 percent impaired, limited or restricted   Carrying,  Moving and Handling Objects Discharge Status [phone]) At least 20 percent but less than 40 percent impaired, limited or restricted      Problem List Patient Active Problem List   Diagnosis Date Noted  . Muscle weakness (generalized) 07/02/2014  . Decreased range of motion of right shoulder 07/02/2014  . Rotator cuff impingement syndrome of right shoulder 06/12/2014  . Coronary atherosclerosis of native coronary artery 09/26/2013  . Tobacco abuse 09/26/2013  . Essential hypertension, benign 09/25/2013    OCCUPATIONAL THERAPY DISCHARGE SUMMARY  Visits from Start of Care: 19  Current functional level related to goals / functional outcomes: See Clinical Impression Statement   Remaining deficits: Patient reports that his strength is not as good as it once was although patient measures strength in the shoulder at 5/5. Pt reports an average pain score of 2/10 during daily tasks.    Education / Equipment: Nyoka Cowden theraband HEP.  Plan: Patient agrees to discharge.  Patient goals were met. Patient is being discharged due to meeting the stated rehab goals.  ?????       Ailene Ravel, OTR/L,CBIS  [phone]  09/18/2014, 2:22 PM  [place] [address] [place], [place], [address] Phone: [phone]   Fax:  [phone]

## 2014-09-18 NOTE — Patient Instructions (Signed)
Strengthening: Chest Pull - Resisted   Hold Theraband in front of body with hands about shoulder width a part. Pull band a part and back together slowly. Repeat _15___ times. Complete __1__ set(s) per session.. Repeat __2__ session(s) per day.  http://orth.exer.us/926   Copyright  VHI. All rights reserved.   PNF Strengthening: Resisted   Standing with resistive band around each hand, bring right arm up and away, thumb back. Repeat 15___ times per set. Do __1__ sets per session. Do __2__ sessions per day.  http://orth.exer.us/918   Copyright  VHI. All rights reserved.   PNF Strengthening: Resisted   Standing with resistive band around each hand, bring right arm up and across body. Repeat _15___ times per set. Do _1___ sets per session. Do _2___ sessions per day.  http://orth.exer.us/920   Copyright  VHI. All rights reserved.    Resisted External Rotation: in Neutral - Bilateral   Sit or stand, tubing in both hands, elbows at sides, bent to 90, forearms forward. Pinch shoulder blades together and rotate forearms out. Keep elbows at sides. Repeat _15___ times per set. Do __1__ sets per session. Do __2__ sessions per day.  http://orth.exer.us/966   Copyright  VHI. All rights reserved.   PNF Strengthening: Resisted   Standing, hold resistive band above head. Bring right arm down and out from side. Repeat _15___ times per set. Do __1__ sets per session. Do _2___ sessions per day.  http://orth.exer.us/922   Copyright  VHI. All rights reserved.

## 2014-09-19 ENCOUNTER — Encounter (HOSPITAL_COMMUNITY): Payer: Medicare Other

## 2014-11-23 ENCOUNTER — Encounter: Payer: Self-pay | Admitting: Cardiology

## 2014-11-23 ENCOUNTER — Ambulatory Visit (INDEPENDENT_AMBULATORY_CARE_PROVIDER_SITE_OTHER): Payer: Medicare Other | Admitting: Cardiology

## 2014-11-23 VITALS — BP 112/78 | HR 75 | Ht 70.0 in | Wt 175.0 lb

## 2014-11-23 DIAGNOSIS — E785 Hyperlipidemia, unspecified: Secondary | ICD-10-CM

## 2014-11-23 DIAGNOSIS — I10 Essential (primary) hypertension: Secondary | ICD-10-CM

## 2014-11-23 DIAGNOSIS — I2510 Atherosclerotic heart disease of native coronary artery without angina pectoris: Secondary | ICD-10-CM

## 2014-11-23 MED ORDER — AMLODIPINE BESYLATE 5 MG PO TABS: 5.0000 mg | ORAL_TABLET | Freq: Every day | ORAL | Status: DC

## 2014-11-23 NOTE — Patient Instructions (Addendum)
Your physician wants you to follow-up in: 6 months with Dr Randa SpikeMcDowell You will receive a reminder letter in the mail two months in advance. If you don't receive a letter, please call our office to schedule the follow-up appointment.     Your physician recommends that you continue on your current medications as directed. Please refer to the Current Medication list given to you today.  Please get FASTING Lipids drawn   I refilled your Norvasc      Thank you for choosing [facility] !

## 2014-11-23 NOTE — Progress Notes (Signed)
Cardiology Office Note  Date: 11/23/2014   ID: Peter Campbell, DOB [birth year], MRN [id number]  PCP: Quinn Axe PA-C  Primary Cardiologist: Nona Dell, MD   Chief Complaint  Patient presents with  . Coronary Artery Disease  . Hypertension    History of Present Illness: Peter Campbell is a 59 y.o. male last seen in August 2015. He presents for a routine follow-up visit. Reports only a few times since the last visit that he has had to use nitroglycerin. We reviewed his medications, and he reports compliance.  He is trying to quit smoking, has done it before and felt better. We talked about strategies to help him quit, and also the [employer].  He is due for follow-up lipid panel, presently not on statin therapy, last LDL was in the 40s.  He is status post right shoulder surgery last year, still has some soreness.   Past Medical History  Diagnosis Date  . Essential hypertension, benign   . Chronic back pain   . Essential tremor   . Coronary atherosclerosis of native coronary artery     Dense coronary atherosclerosis by chest CT November 2014   . History of chicken pox   . History of [place] measles   . Sciatica   . Collagen vascular disease   . Asthma     Past Surgical History  Procedure Laterality Date  . Colon surgery    . Abdominal surgery    . Tonsillectomy    . Circumcision    . Shoulder open rotator cuff repair Right 06/12/2014    Procedure: OPEN REPAIR ROTATOR CUFF RIGHT ;  Surgeon: Darreld Mclean, MD;  Location: [facility];  Service: Orthopedics;  Laterality: Right;  . Shoulder acromioplasty Right 06/12/2014    Procedure: RIGHT SHOULDER ACROMIOPLASTY;  Surgeon: Darreld Mclean, MD;  Location: [facility];  Service: Orthopedics;  Laterality: Right;    Current Outpatient Prescriptions  Medication Sig Dispense Refill  . amLODipine (NORVASC) 5 MG tablet Take 1 tablet (5 mg total) by mouth daily. 90 tablet 3  . aspirin EC 81 MG tablet Take 1 tablet (81 mg total) by  mouth daily. 90 tablet 3  . gabapentin (NEURONTIN) 400 MG capsule Take 400 mg by mouth 3 (three) times daily.    . metoprolol succinate (TOPROL-XL) 25 MG 24 hr tablet Take 25 mg by mouth 2 (two) times daily.    Marland Kitchen NAPROXEN PO Take 500 mg by mouth daily.     . nitroGLYCERIN (NITROSTAT) 0.4 MG SL tablet Place 1 tablet (0.4 mg total) under the tongue every 5 (five) minutes as needed for chest pain. 25 tablet 3  . temazepam (RESTORIL) 30 MG capsule Take 30 mg by mouth at bedtime as needed for sleep.     No current facility-administered medications for this visit.    Allergies:  Morphine and related   Social History: The patient  reports that he has been smoking Cigarettes.  He started smoking about 45 years ago. He has a 21 pack-year smoking history. He has never used smokeless tobacco. He reports that he does not drink alcohol or use illicit drugs.    ROS:  Please see the history of present illness. Otherwise, complete review of systems is positive for none.  All other systems are reviewed and negative.    Physical Exam: VS:  BP 112/78 mmHg  Pulse 75  Ht  (1.778 m)  Wt 175 lb (79.379 kg)  BMI 25.11 kg/m2  SpO2  96%, BMI Body mass index is 25.11 kg/(m^2).  Wt Readings from Last 3 Encounters:  11/23/14 175 lb (79.379 kg)  06/12/14 172 lb (78.019 kg)  05/22/14 184 lb (83.462 kg)     The patient appears comfortable at rest.  HEENT: Conjunctiva and lids normal, oropharynx clear with moist mucosa.  Neck: Supple, no elevated JVP or carotid bruits, no thyromegaly.  Lungs: Clear to auscultation, nonlabored breathing at rest.  Cardiac: Regular rate and rhythm, no S3, soft systolic murmur, no pericardial rub.  Abdomen: Soft, nontender, bowel sounds present, no guarding or rebound.  Extremities: No pitting edema, distal pulses 1-2+.  Skin: Warm and dry. Scattered tattoos.  Musculoskeletal: No kyphosis.  Neuropsychiatric: Alert and oriented x3, affect grossly appropriate.  Resting tremor.   ECG: ECG is not ordered today.  Recent Labwork: 06/07/2014: ALT 9; AST 10; BUN 22; Creatinine 0.76; Potassium 4.1; Sodium 141 06/14/2014: Hemoglobin 12.9*; Platelets 210     Component Value Date/Time   CHOL 96 09/26/2013 0831   TRIG 98 09/26/2013 0831   HDL 27* 09/26/2013 0831   CHOLHDL 3.6 09/26/2013 0831   VLDL 20 09/26/2013 0831   LDLCALC 49 09/26/2013 0831    Other Studies Reviewed Today:  Marlane HatcherLexiscan Cardiolite from January 2015 showed no evidence of scar or ischemia with normal LVEF 63%.  ASSESSMENT AND PLAN:  1. CAD based on chest CT imaging in November 2014 with subsequent Cardiolite showing no focal ischemia. Plan is to continue medical therapy and observation. I reviewed his medications which are outlined above, no changes were made.  2. Reassess lipid status, previous LDL 40 06/17/2013. Currently not on statin therapy.  3. Tobacco abuse, does have a plan to quit smoking again. We discussed this today.  4. Essential hypertension, blood pressure is normal today.  Current medicines are reviewed at length with the patient today.  The patient does not have concerns regarding medicines.   Orders Placed This Encounter  Procedures  . Lipid Profile    Disposition: FU with me in 6 months.   Signed, Jonelle SidleSamuel G. Lasean Gorniak, MD, [facility]ACC 11/23/2014 11:03 AM    [facility] [address], [place], [place] [address] Phone: [phone]; Fax: [phone]

## 2014-12-03 LAB — LIPID PANEL
CHOL/HDL RATIO: 5.6 ratio
CHOLESTEROL: 100 mg/dL (ref 0–200)
HDL: 18 mg/dL — AB (ref >=40)
LDL Cholesterol: 26 mg/dL (ref 0–99)
TRIGLYCERIDES: 279 mg/dL — AB (ref <150)
VLDL: 56 mg/dL — ABNORMAL HIGH (ref 0–40)

## 2015-05-07 ENCOUNTER — Encounter: Payer: Self-pay | Admitting: Cardiology

## 2015-05-10 ENCOUNTER — Other Ambulatory Visit: Payer: Self-pay | Admitting: Cardiology

## 2015-05-10 MED ORDER — METOPROLOL SUCCINATE ER 25 MG PO TB24: 25.0000 mg | ORAL_TABLET | Freq: Two times a day (BID) | ORAL | Status: DC

## 2015-05-10 NOTE — Telephone Encounter (Signed)
Refill on Metoprolol sent to [facility] / tg

## 2015-06-04 ENCOUNTER — Ambulatory Visit: Payer: Medicare Other | Admitting: Cardiology

## 2015-06-06 ENCOUNTER — Ambulatory Visit: Payer: Medicare Other | Admitting: Cardiology

## 2015-06-19 ENCOUNTER — Emergency Department (HOSPITAL_COMMUNITY): Payer: Medicare Other

## 2015-06-19 ENCOUNTER — Encounter (HOSPITAL_COMMUNITY): Payer: Self-pay | Admitting: Emergency Medicine

## 2015-06-19 ENCOUNTER — Emergency Department (HOSPITAL_COMMUNITY)
Admission: EM | Admit: 2015-06-19 | Discharge: 2015-06-19 | Disposition: A | Discharge status: Home / Self Care | Payer: Medicare Other | Attending: Emergency Medicine | Admitting: Emergency Medicine

## 2015-06-19 DIAGNOSIS — I10 Essential (primary) hypertension: Secondary | ICD-10-CM | POA: Insufficient documentation

## 2015-06-19 DIAGNOSIS — R0602 Shortness of breath: Secondary | ICD-10-CM | POA: Diagnosis present

## 2015-06-19 DIAGNOSIS — Z79899 Other long term (current) drug therapy: Secondary | ICD-10-CM | POA: Insufficient documentation

## 2015-06-19 DIAGNOSIS — Z8619 Personal history of other infectious and parasitic diseases: Secondary | ICD-10-CM | POA: Diagnosis not present

## 2015-06-19 DIAGNOSIS — Z791 Long term (current) use of non-steroidal anti-inflammatories (NSAID): Secondary | ICD-10-CM | POA: Insufficient documentation

## 2015-06-19 DIAGNOSIS — G8929 Other chronic pain: Secondary | ICD-10-CM | POA: Diagnosis not present

## 2015-06-19 DIAGNOSIS — Z7982 Long term (current) use of aspirin: Secondary | ICD-10-CM | POA: Diagnosis not present

## 2015-06-19 DIAGNOSIS — M5430 Sciatica, unspecified side: Secondary | ICD-10-CM | POA: Diagnosis not present

## 2015-06-19 DIAGNOSIS — J441 Chronic obstructive pulmonary disease with (acute) exacerbation: Secondary | ICD-10-CM | POA: Diagnosis not present

## 2015-06-19 DIAGNOSIS — Z720 Tobacco use: Secondary | ICD-10-CM | POA: Diagnosis not present

## 2015-06-19 DIAGNOSIS — I2510 Atherosclerotic heart disease of native coronary artery without angina pectoris: Secondary | ICD-10-CM | POA: Insufficient documentation

## 2015-06-19 DIAGNOSIS — J209 Acute bronchitis, unspecified: Secondary | ICD-10-CM

## 2015-06-19 LAB — COMPREHENSIVE METABOLIC PANEL
ALT: 10 U/L — AB (ref 17–63)
AST: 12 U/L — AB (ref 15–41)
Albumin: 3.6 g/dL (ref 3.5–5.0)
Alkaline Phosphatase: 63 U/L (ref 38–126)
Anion gap: 4 — ABNORMAL LOW (ref 5–15)
BILIRUBIN TOTAL: 0.2 mg/dL — AB (ref 0.3–1.2)
BUN: 28 mg/dL — AB (ref 6–20)
CO2: 27 mmol/L (ref 22–32)
CREATININE: 0.87 mg/dL (ref 0.61–1.24)
Calcium: 8.2 mg/dL — ABNORMAL LOW (ref 8.9–10.3)
Chloride: 106 mmol/L (ref 101–111)
GFR calc Af Amer: >60 mL/min (ref >60)
GFR calc non Af Amer: >60 mL/min (ref >60)
Glucose, Bld: 103 mg/dL — ABNORMAL HIGH (ref 65–99)
Potassium: 4 mmol/L (ref 3.5–5.1)
Sodium: 137 mmol/L (ref 135–145)
TOTAL PROTEIN: 5.9 g/dL — AB (ref 6.5–8.1)

## 2015-06-19 LAB — CBC WITH DIFFERENTIAL/PLATELET
BASOS PCT: 0 %
Basophils Absolute: 0 10*3/uL (ref 0.0–0.1)
EOS ABS: 0.2 10*3/uL (ref 0.0–0.7)
Eosinophils Relative: 3 %
HCT: 40.6 % (ref 39.0–52.0)
Hemoglobin: 13.4 g/dL (ref 13.0–17.0)
Lymphocytes Relative: 25 %
Lymphs Abs: 1.8 10*3/uL (ref 0.7–4.0)
MCH: 31.1 pg (ref 26.0–34.0)
MCHC: 33 g/dL (ref 30.0–36.0)
MCV: 94.2 fL (ref 78.0–100.0)
MONO ABS: 0.5 10*3/uL (ref 0.1–1.0)
MONOS PCT: 7 %
Neutro Abs: 4.6 10*3/uL (ref 1.7–7.7)
Neutrophils Relative %: 65 %
Platelets: 167 10*3/uL (ref 150–400)
RBC: 4.31 MIL/uL (ref 4.22–5.81)
RDW: 12.6 % (ref 11.5–15.5)
WBC: 7.1 10*3/uL (ref 4.0–10.5)

## 2015-06-19 LAB — BRAIN NATRIURETIC PEPTIDE: B Natriuretic Peptide: 65 pg/mL (ref 0.0–100.0)

## 2015-06-19 LAB — TROPONIN I

## 2015-06-19 MED ORDER — ALBUTEROL SULFATE (2.5 MG/3ML) 0.083% IN NEBU
2.5000 mg | INHALATION_SOLUTION | Freq: Once | RESPIRATORY_TRACT | Status: AC
  Administered 2015-06-19: 2.5 mg via RESPIRATORY_TRACT
  Filled 2015-06-19: qty 3

## 2015-06-19 MED ORDER — IPRATROPIUM BROMIDE 0.02 % IN SOLN: 0.5000 mg | Freq: Once | RESPIRATORY_TRACT | Status: DC

## 2015-06-19 MED ORDER — IPRATROPIUM-ALBUTEROL 0.5-2.5 (3) MG/3ML IN SOLN
3.0000 mL | Freq: Once | RESPIRATORY_TRACT | Status: AC
  Administered 2015-06-19: 3 mL via RESPIRATORY_TRACT
  Filled 2015-06-19: qty 3

## 2015-06-19 MED ORDER — ALBUTEROL SULFATE (2.5 MG/3ML) 0.083% IN NEBU: 5.0000 mg | INHALATION_SOLUTION | Freq: Once | RESPIRATORY_TRACT | Status: DC

## 2015-06-19 MED ORDER — AZITHROMYCIN 250 MG PO TABS: ORAL_TABLET | ORAL | Status: DC

## 2015-06-19 MED ORDER — PREDNISONE 10 MG PO TABS: 20.0000 mg | ORAL_TABLET | Freq: Two times a day (BID) | ORAL | Status: DC

## 2015-06-19 MED ORDER — IPRATROPIUM-ALBUTEROL 0.5-2.5 (3) MG/3ML IN SOLN: 3.0000 mL | Freq: Four times a day (QID) | RESPIRATORY_TRACT | Status: DC | PRN

## 2015-06-19 NOTE — ED Provider Notes (Signed)
CSN: [id number]     Arrival date & time 06/19/15  1239 History   First MD Initiated Contact with Patient 06/19/15 1303     Chief Complaint  Patient presents with  . Shortness of Breath  . Arm Pain    right  . Headache     (Consider location/radiation/quality/duration/timing/severity/associated sxs/prior Treatment) HPI Comments: Patient is a 59 year old male with history of HTN, reported CAD (no prior cath), and possible COPD.  He presents today with a 3 day history of difficulty breathing and chest congestion. He denies any productive cough. He denies any fever but reports feeling cold. He does smoke one pack of cigarettes per day. The coughing he is doing is caused his right shoulder to begin hurting again. He has a history of surgical repair in the past.  Patient is a 59 y.o. male presenting with shortness of breath. The history is provided by the patient.  Shortness of Breath Severity:  Moderate Onset quality:  Gradual Duration:  3 days Timing:  Constant Progression:  Worsening Chronicity:  New Context: activity and URI   Relieved by:  Nothing Worsened by:  Nothing tried Ineffective treatments:  None tried   Past Medical History  Diagnosis Date  . Essential hypertension, benign   . Chronic back pain   . Essential tremor   . Coronary atherosclerosis of native coronary artery     Dense coronary atherosclerosis by chest CT November 2014   . History of chicken pox   . History of [place] measles   . Sciatica   . Collagen vascular disease   . Asthma    Past Surgical History  Procedure Laterality Date  . Colon surgery    . Abdominal surgery    . Tonsillectomy    . Circumcision    . Shoulder open rotator cuff repair Right 06/12/2014    Procedure: OPEN REPAIR ROTATOR CUFF RIGHT ;  Surgeon: Darreld Mclean, MD;  Location: [facility];  Service: Orthopedics;  Laterality: Right;  . Shoulder acromioplasty Right 06/12/2014    Procedure: RIGHT SHOULDER ACROMIOPLASTY;  Surgeon: Darreld Mclean, MD;  Location: [facility];  Service: Orthopedics;  Laterality: Right;   Family History  Problem Relation Age of Onset  . COPD Father   . Cancer Father   . Cancer Mother   . Heart attack Brother   . Cancer Sister    Social History  Substance Use Topics  . Smoking status: Current Every Day Smoker -- 1.00 packs/day for 21 years    Types: Cigarettes    Start date: 08/28/1969  . Smokeless tobacco: Never Used  . Alcohol Use: No    Review of Systems  Respiratory: Positive for shortness of breath.   All other systems reviewed and are negative.     Allergies  Morphine and related  Home Medications   Prior to Admission medications   Medication Sig Start Date End Date Taking? Authorizing Provider  amLODipine (NORVASC) 5 MG tablet Take 1 tablet (5 mg total) by mouth daily. 11/23/14   Jonelle Sidle, MD  aspirin EC 81 MG tablet Take 1 tablet (81 mg total) by mouth daily. 10/31/13   Jonelle Sidle, MD  gabapentin (NEURONTIN) 400 MG capsule Take 400 mg by mouth 3 (three) times daily.    Historical Provider, MD  metoprolol succinate (TOPROL-XL) 25 MG 24 hr tablet Take 1 tablet (25 mg total) by mouth 2 (two) times daily. 05/10/15   Jonelle Sidle, MD  NAPROXEN PO Take 500  mg by mouth daily.     Historical Provider, MD  nitroGLYCERIN (NITROSTAT) 0.4 MG SL tablet Place 1 tablet (0.4 mg total) under the tongue every 5 (five) minutes as needed for chest pain. 05/22/14   Jonelle Sidle, MD  temazepam (RESTORIL) 30 MG capsule Take 30 mg by mouth at bedtime as needed for sleep.    Historical Provider, MD   BP 122/69 mmHg  Pulse 50  Temp(Src) 98 F (36.7 C) (Oral)  Resp 18  Ht  (1.778 m)  Wt 170 lb (77.111 kg)  BMI 24.39 kg/m2  SpO2 98% Physical Exam  Constitutional: He is oriented to person, place, and time. He appears well-developed and well-nourished. No distress.  HENT:  Head: Normocephalic and atraumatic.  Mouth/Throat: Oropharynx is clear and moist.  Neck: Normal  range of motion. Neck supple.  Cardiovascular: Normal rate, regular rhythm and normal heart sounds.   No murmur heard. Pulmonary/Chest: Effort normal. No respiratory distress.  There are rhonchorous breath sounds bilaterally. He is able to speak in full sentences and appears in no respiratory distress.  Abdominal: Soft. Bowel sounds are normal. He exhibits no distension. There is no tenderness.  Musculoskeletal: Normal range of motion. He exhibits no edema.  There is no lower extremity edema, no calf tenderness, and Homans sign is absent bilaterally.  Neurological: He is alert and oriented to person, place, and time.  Skin: Skin is warm and dry. He is not diaphoretic.  Nursing note and vitals reviewed.   ED Course  Procedures (including critical care time) Labs Review Labs Reviewed  COMPREHENSIVE METABOLIC PANEL  TROPONIN I  BRAIN NATRIURETIC PEPTIDE  CBC WITH DIFFERENTIAL/PLATELET    Imaging Review No results found. I have personally reviewed and evaluated these images and lab results as part of my medical decision-making.   EKG Interpretation None      MDM   Final diagnoses:  None    Patient's wheezing is markedly improved after a nebulizer treatment. I suspect this is a flareup of COPD. He will be treated with continued albuterol nebs, steroids, Zithromax, and when necessary return.    Geoffery Lyons, MD 06/19/15 1500

## 2015-06-19 NOTE — Discharge Instructions (Signed)
Zithromax and prednisone as prescribed.  DuoNeb every 6 hours as needed for wheezing.   Acute Bronchitis Bronchitis is inflammation of the airways that extend from the windpipe into the lungs (bronchi). The inflammation often causes mucus to develop. This leads to a cough, which is the most common symptom of bronchitis.  In acute bronchitis, the condition usually develops suddenly and goes away over time, usually in a couple weeks. Smoking, allergies, and asthma can make bronchitis worse. Repeated episodes of bronchitis may cause further lung problems.  CAUSES Acute bronchitis is most often caused by the same virus that causes a cold. The virus can spread from person to person (contagious) through coughing, sneezing, and touching contaminated objects. SIGNS AND SYMPTOMS   Cough.   Fever.   Coughing up mucus.   Body aches.   Chest congestion.   Chills.   Shortness of breath.   Sore throat.  DIAGNOSIS  Acute bronchitis is usually diagnosed through a physical exam. Your health care provider will also ask you questions about your medical history. Tests, such as chest X-rays, are sometimes done to rule out other conditions.  TREATMENT  Acute bronchitis usually goes away in a couple weeks. Oftentimes, no medical treatment is necessary. Medicines are sometimes given for relief of fever or cough. Antibiotic medicines are usually not needed but may be prescribed in certain situations. In some cases, an inhaler may be recommended to help reduce shortness of breath and control the cough. A cool mist vaporizer may also be used to help thin bronchial secretions and make it easier to clear the chest.  HOME CARE INSTRUCTIONS  Get plenty of rest.   Drink enough fluids to keep your urine clear or pale yellow (unless you have a medical condition that requires fluid restriction). Increasing fluids may help thin your respiratory secretions (sputum) and reduce chest congestion, and it will  prevent dehydration.   Take medicines only as directed by your health care provider.  If you were prescribed an antibiotic medicine, finish it all even if you start to feel better.  Avoid smoking and secondhand smoke. Exposure to cigarette smoke or irritating chemicals will make bronchitis worse. If you are a smoker, consider using nicotine gum or skin patches to help control withdrawal symptoms. Quitting smoking will help your lungs heal faster.   Reduce the chances of another bout of acute bronchitis by washing your hands frequently, avoiding people with cold symptoms, and trying not to touch your hands to your mouth, nose, or eyes.   Keep all follow-up visits as directed by your health care provider.  SEEK MEDICAL CARE IF: Your symptoms do not improve after 1 week of treatment.  SEEK IMMEDIATE MEDICAL CARE IF:  You develop an increased fever or chills.   You have chest pain.   You have severe shortness of breath.  You have bloody sputum.   You develop dehydration.  You faint or repeatedly feel like you are going to pass out.  You develop repeated vomiting.  You develop a severe headache. MAKE SURE YOU:   Understand these instructions.  Will watch your condition.  Will get help right away if you are not doing well or get worse. Document Released: 10/22/2004 Document Revised: 01/29/2014 Document Reviewed: 03/07/2013 [facility] Patient Information 2015 [place], [place]. This information is not intended to replace advice given to you by your health care provider. Make sure you discuss any questions you have with your health care provider.  Chronic Obstructive Pulmonary Disease Chronic obstructive pulmonary  disease (COPD) is a common lung condition in which airflow from the lungs is limited. COPD is a general term that can be used to describe many different lung problems that limit airflow, including both chronic bronchitis and emphysema. If you have COPD, your lung  function will probably never return to normal, but there are measures you can take to improve lung function and make yourself feel better.  CAUSES   Smoking (common).   Exposure to secondhand smoke.   Genetic problems.  Chronic inflammatory lung diseases or recurrent infections. SYMPTOMS   Shortness of breath, especially with physical activity.   Deep, persistent (chronic) cough with a large amount of thick mucus.   Wheezing.   Rapid breaths (tachypnea).   Gray or bluish discoloration (cyanosis) of the skin, especially in fingers, toes, or lips.   Fatigue.   Weight loss.   Frequent infections or episodes when breathing symptoms become much worse (exacerbations).   Chest tightness. DIAGNOSIS  Your health care provider will take a medical history and perform a physical examination to make the initial diagnosis. Additional tests for COPD may include:   Lung (pulmonary) function tests.  Chest X-ray.  CT scan.  Blood tests. TREATMENT  Treatment available to help you feel better when you have COPD includes:   Inhaler and nebulizer medicines. These help manage the symptoms of COPD and make your breathing more comfortable.  Supplemental oxygen. Supplemental oxygen is only helpful if you have a low oxygen level in your blood.   Exercise and physical activity. These are beneficial for nearly all people with COPD. Some people may also benefit from a pulmonary rehabilitation program. HOME CARE INSTRUCTIONS   Take all medicines (inhaled or pills) as directed by your health care provider.  Avoid over-the-counter medicines or cough syrups that dry up your airway (such as antihistamines) and slow down the elimination of secretions unless instructed otherwise by your health care provider.   If you are a smoker, the most important thing that you can do is stop smoking. Continuing to smoke will cause further lung damage and breathing trouble. Ask your health care  provider for help with quitting smoking. He or she can direct you to community resources or hospitals that provide support.  Avoid exposure to irritants such as smoke, chemicals, and fumes that aggravate your breathing.  Use oxygen therapy and pulmonary rehabilitation if directed by your health care provider. If you require home oxygen therapy, ask your health care provider whether you should purchase a pulse oximeter to measure your oxygen level at home.   Avoid contact with individuals who have a contagious illness.  Avoid extreme temperature and humidity changes.  Eat healthy foods. Eating smaller, more frequent meals and resting before meals may help you maintain your strength.  Stay active, but balance activity with periods of rest. Exercise and physical activity will help you maintain your ability to do things you want to do.  Preventing infection and hospitalization is very important when you have COPD. Make sure to receive all the vaccines your health care provider recommends, especially the pneumococcal and influenza vaccines. Ask your health care provider whether you need a pneumonia vaccine.  Learn and use relaxation techniques to manage stress.  Learn and use controlled breathing techniques as directed by your health care provider. Controlled breathing techniques include:   Pursed lip breathing. Start by breathing in (inhaling) through your nose for 1 second. Then, purse your lips as if you were going to whistle and breathe out (  exhale) through the pursed lips for 2 seconds.   Diaphragmatic breathing. Start by putting one hand on your abdomen just above your waist. Inhale slowly through your nose. The hand on your abdomen should move out. Then purse your lips and exhale slowly. You should be able to feel the hand on your abdomen moving in as you exhale.   Learn and use controlled coughing to clear mucus from your lungs. Controlled coughing is a series of short, progressive  coughs. The steps of controlled coughing are:  1. Lean your head slightly forward.  2. Breathe in deeply using diaphragmatic breathing.  3. Try to hold your breath for 3 seconds.  4. Keep your mouth slightly open while coughing twice.  5. Spit any mucus out into a tissue.  6. Rest and repeat the steps once or twice as needed. SEEK MEDICAL CARE IF:   You are coughing up more mucus than usual.   There is a change in the color or thickness of your mucus.   Your breathing is more labored than usual.   Your breathing is faster than usual.  SEEK IMMEDIATE MEDICAL CARE IF:   You have shortness of breath while you are resting.   You have shortness of breath that prevents you from:  Being able to talk.   Performing your usual physical activities.   You have chest pain lasting longer than 5 minutes.   Your skin color is more cyanotic than usual.  You measure low oxygen saturations for longer than 5 minutes with a pulse oximeter. MAKE SURE YOU:   Understand these instructions.  Will watch your condition.  Will get help right away if you are not doing well or get worse. Document Released: 06/24/2005 Document Revised: 01/29/2014 Document Reviewed: 05/11/2013 [facility] Patient Information 2015 [place], [place]. This information is not intended to replace advice given to you by your health care provider. Make sure you discuss any questions you have with your health care provider.

## 2015-06-19 NOTE — ED Notes (Signed)
Patient with no complaints at this time. Respirations even and unlabored. Skin warm/dry. Discharge instructions reviewed with patient at this time. Patient given opportunity to voice concerns/ask questions. Patient discharged at this time and left Emergency Department with steady gait.   

## 2015-06-19 NOTE — ED Notes (Signed)
SOB for last 3 days.  Do not have PCP.  Pain to right arm, shoulder and hand.  Rates 10/10, denies injury.  C/o headache 8/10

## 2015-07-01 ENCOUNTER — Encounter: Payer: Self-pay | Admitting: Cardiology

## 2015-07-01 ENCOUNTER — Ambulatory Visit (INDEPENDENT_AMBULATORY_CARE_PROVIDER_SITE_OTHER): Payer: Medicare Other | Admitting: Cardiology

## 2015-07-01 VITALS — BP 118/78 | HR 68 | Ht 70.0 in | Wt 167.0 lb

## 2015-07-01 DIAGNOSIS — I2510 Atherosclerotic heart disease of native coronary artery without angina pectoris: Secondary | ICD-10-CM

## 2015-07-01 DIAGNOSIS — Z720 Tobacco use: Secondary | ICD-10-CM | POA: Diagnosis not present

## 2015-07-01 DIAGNOSIS — I10 Essential (primary) hypertension: Secondary | ICD-10-CM | POA: Diagnosis not present

## 2015-07-01 NOTE — Patient Instructions (Signed)
Your physician wants you to follow-up in: 6 months Dr McDowell You will receive a reminder letter in the mail two months in advance. If you don't receive a letter, please call our office to schedule the follow-up appointment.    Your physician recommends that you continue on your current medications as directed. Please refer to the Current Medication list given to you today.     Thank you for choosing [facility] !        

## 2015-07-01 NOTE — Progress Notes (Signed)
Cardiology Office Note  Date: 07/01/2015   ID: Peter Campbell, DOB [birth year], MRN [id number]  PCP: No primary care provider on file.  Primary Cardiologist: Nona Dell, MD   Chief Complaint  Patient presents with  . Coronary Artery Disease    History of Present Illness: Peter Campbell is a 59 y.o. male last seen in February. He presents for a routine follow-up visit. He does not endorse any angina symptoms or increasing shortness of breath over baseline. Recent ER visit noted in late September with suspected bronchitis, symptoms improved with nebulizer treatment, he was laced on antibiotic since steroids as an outpatient. Of note, troponin I was less than 0.03 and BNP was normal at 65. He states that he has completed antibiotic, still has not quite gotten back to baseline, but his cough is improved.  He continues to smoke cigarettes. We addressed smoking cessation. He states he is trying to quit.  Lipid panel from earlier in the year showed very low lipid numbers, he is not on statin therapy.  He had follow-up ischemic evaluation last year, overall low risk.   Past Medical History  Diagnosis Date  . Essential hypertension, benign   . Chronic back pain   . Essential tremor   . Coronary atherosclerosis of native coronary artery     Dense coronary atherosclerosis by chest CT November 2014   . History of chicken pox   . History of [place] measles   . Sciatica   . Collagen vascular disease (HCC)   . Asthma     Past Surgical History  Procedure Laterality Date  . Colon surgery    . Abdominal surgery    . Tonsillectomy    . Circumcision    . Shoulder open rotator cuff repair Right 06/12/2014    Procedure: OPEN REPAIR ROTATOR CUFF RIGHT ;  Surgeon: Darreld Mclean, MD;  Location: [facility];  Service: Orthopedics;  Laterality: Right;  . Shoulder acromioplasty Right 06/12/2014    Procedure: RIGHT SHOULDER ACROMIOPLASTY;  Surgeon: Darreld Mclean, MD;  Location: [facility];  Service:  Orthopedics;  Laterality: Right;    Current Outpatient Prescriptions  Medication Sig Dispense Refill  . amLODipine (NORVASC) 5 MG tablet Take 1 tablet (5 mg total) by mouth daily. 90 tablet 3  . aspirin EC 81 MG tablet Take 1 tablet (81 mg total) by mouth daily. 90 tablet 3  . gabapentin (NEURONTIN) 400 MG capsule Take 400 mg by mouth 3 (three) times daily.    Marland Kitchen ipratropium-albuterol (DUONEB) 0.5-2.5 (3) MG/3ML SOLN Take 3 mLs by nebulization every 6 (six) hours as needed. 360 mL 0  . metoprolol succinate (TOPROL-XL) 25 MG 24 hr tablet Take 1 tablet (25 mg total) by mouth 2 (two) times daily. 60 tablet 6  . naproxen (NAPROSYN) 500 MG tablet Take 1 tablet by mouth 2 (two) times daily.    . nitroGLYCERIN (NITROSTAT) 0.4 MG SL tablet Place 1 tablet (0.4 mg total) under the tongue every 5 (five) minutes as needed for chest pain. 25 tablet 3  . oxyCODONE-acetaminophen (PERCOCET) 7.5-325 MG per tablet Take 1 tablet by mouth every 4 (four) hours as needed.  0  . ALPRAZolam (XANAX) 1 MG tablet Take 1 mg by mouth at bedtime as needed for sleep.     No current facility-administered medications for this visit.    Allergies:  Morphine and related   Social History: The patient  reports that he has been smoking Cigarettes.  He started smoking about 45  years ago. He has a 21 pack-year smoking history. He has never used smokeless tobacco. He reports that he does not drink alcohol or use illicit drugs.   ROS:  Please see the history of present illness. Otherwise, complete review of systems is positive for none.  All other systems are reviewed and negative.   Physical Exam: VS:  BP 118/78 mmHg  Pulse 68  Ht  (1.778 m)  Wt 167 lb (75.751 kg)  BMI 23.96 kg/m2  SpO2 98%, BMI Body mass index is 23.96 kg/(m^2).  Wt Readings from Last 3 Encounters:  07/01/15 167 lb (75.751 kg)  06/19/15 170 lb (77.111 kg)  11/23/14 175 lb (79.379 kg)     The patient appears comfortable at rest.  HEENT:  Conjunctiva and lids normal, oropharynx clear with moist mucosa.  Neck: Supple, no elevated JVP or carotid bruits, no thyromegaly.  Lungs: Clear to auscultation, nonlabored breathing at rest.  Cardiac: Regular rate and rhythm, no S3, soft systolic murmur, no pericardial rub.  Abdomen: Soft, nontender, bowel sounds present, no guarding or rebound.  Extremities: No pitting edema, distal pulses 1-2+.  Skin: Warm and dry. Scattered tattoos.  Musculoskeletal: No kyphosis.  Neuropsychiatric: Alert and oriented x3, affect grossly appropriate. Resting tremor.   ECG: ECG is not ordered today.  Recent Labwork: 06/19/2015: ALT 10*; AST 12*; B Natriuretic Peptide 65.0; BUN 28*; Creatinine, Ser 0.87; Hemoglobin 13.4; Platelets 167; Potassium 4.0; Sodium 137     Component Value Date/Time   CHOL 100 12/03/2014 0735   TRIG 279* 12/03/2014 0735   HDL 18* 12/03/2014 0735   CHOLHDL 5.6 12/03/2014 0735   VLDL 56* 12/03/2014 0735   LDLCALC 26 12/03/2014 0735    Other Studies Reviewed Today:  [name] from January 2015 showed no evidence of scar or ischemia with normal LVEF 63%.  ASSESSMENT AND PLAN:  1. Coronary artery disease based on previous CT imaging. Cardiolite study from last year showed no focal ischemia, and plan has been to continue medical therapy and observation.  2. Essential hypertension, blood pressure is normal today.  3. Recent reported episode of bronchitis managed at ER visit.  4. Low lipids at baseline, not on statin therapy.  5. Ongoing tobacco abuse. We discussed smoking cessation strategies.  Current medicines were reviewed at length with the patient today.  Disposition: FU with me in 6 months.   Signed, Jonelle Sidle, MD, [facility] 07/01/2015 2:37 PM    [facility] at [facility] [address] [place], [place] [address] Phone: [phone]; Fax: [phone]

## 2015-11-11 ENCOUNTER — Telehealth: Payer: Self-pay | Admitting: *Deleted

## 2015-11-11 MED ORDER — OXYCODONE-ACETAMINOPHEN 5-325 MG PO TABS: 1.0000 | ORAL_TABLET | ORAL | Status: DC | PRN

## 2015-11-11 MED ORDER — NAPROXEN 500 MG PO TABS: 500.0000 mg | ORAL_TABLET | Freq: Two times a day (BID) | ORAL | Status: DC

## 2015-11-11 NOTE — Telephone Encounter (Addendum)
Patient called requesting Oxycodone APAP 7.5/325 qty 100 tabs and Naproxen  qty 60  Tabs to be refilled to [facility].  Please advise [phone]

## 2015-11-11 NOTE — Telephone Encounter (Signed)
Rx done. 

## 2015-11-12 ENCOUNTER — Telehealth: Payer: Self-pay | Admitting: Orthopaedic Surgery

## 2015-11-12 ENCOUNTER — Encounter (INDEPENDENT_AMBULATORY_CARE_PROVIDER_SITE_OTHER): Payer: Self-pay | Admitting: *Deleted

## 2015-11-12 NOTE — Telephone Encounter (Signed)
Patient called to request refills (1) Oxycodone APA 7.5/325, quantity 100, and (2) Naproxen(Naprosyn)500mg , quantity 60; per Dr Sanjuan Dame previous medical records, per Angie, patient appears to have 4 refills remaining on the Naproxen/Naprosyn, for which he will contact [facility]  [place].  Please advise regarding the pain medication Oxycodone.  Patient's ph# is [phone]

## 2015-11-12 NOTE — Telephone Encounter (Signed)
Prescription available, patient aware  

## 2015-11-13 MED ORDER — OXYCODONE-ACETAMINOPHEN 7.5-325 MG PO TABS: ORAL_TABLET | ORAL | Status: DC

## 2015-11-13 NOTE — Telephone Encounter (Signed)
Rx printed

## 2015-11-13 NOTE — Telephone Encounter (Signed)
Patient aware prescription is ready for pick up, and advised of the refills on the naprosyn.

## 2015-11-13 NOTE — Telephone Encounter (Signed)
Rx for pain medicine printed. Tell him about his Naprosyn.

## 2015-11-28 ENCOUNTER — Encounter: Payer: Self-pay | Admitting: Orthopaedic Surgery

## 2015-11-28 ENCOUNTER — Ambulatory Visit: Payer: Medicare Other | Admitting: Orthopaedic Surgery

## 2015-11-29 ENCOUNTER — Other Ambulatory Visit: Payer: Self-pay | Admitting: Cardiology

## 2015-12-03 ENCOUNTER — Ambulatory Visit (INDEPENDENT_AMBULATORY_CARE_PROVIDER_SITE_OTHER): Payer: Medicare Other | Admitting: Orthopaedic Surgery

## 2015-12-03 VITALS — BP 131/78 | HR 70 | Temp 98.6°F | Ht 70.0 in | Wt 182.6 lb

## 2015-12-03 DIAGNOSIS — M25511 Pain in right shoulder: Secondary | ICD-10-CM | POA: Diagnosis not present

## 2015-12-03 MED ORDER — OXYCODONE-ACETAMINOPHEN 7.5-325 MG PO TABS: ORAL_TABLET | ORAL | Status: DC

## 2015-12-03 NOTE — Patient Instructions (Signed)

## 2015-12-03 NOTE — Progress Notes (Addendum)
Patient ZO:XWRUE MARLON Campbell, male DOB:14-Jun-1956, 60 y.o. AVW:[id number]  Chief Complaint  Patient presents with  . Follow-up    Right shoulder "not been feeling as good for last couple weeks"    HPI  Peter Campbell is a 60 y.o. male who has chronic pain of the right shoulder.  He has no new acute problem. He is doing his exercises.  Shoulder Pain  The pain is present in the right shoulder. This is a chronic problem. The current episode started more than 1 year ago. The problem occurs daily. The problem has been waxing and waning. The quality of the pain is described as aching. The pain is at a severity of 3/10. The pain is mild. The symptoms are aggravated by activity and cold. He has tried NSAIDS, oral narcotics, rest and heat for the symptoms. The treatment provided moderate relief.    Body mass index is 26.2 kg/(m^2).   Review of Systems  Constitutional:       Patient does not have Diabetes Mellitus. Patient has hypertension. Patient has COPD or shortness of breath. Patient does not have BMI > 35. Patient has current smoking history.  HENT: Negative for congestion.   Respiratory: Positive for cough and shortness of breath.   Cardiovascular: Negative for chest pain.  Endocrine: Positive for cold intolerance.  Musculoskeletal: Positive for myalgias, back pain and arthralgias.  Allergic/Immunologic: Negative for environmental allergies.    Past Medical History  Diagnosis Date  . Essential hypertension, benign   . Chronic back pain   . Essential tremor   . Coronary atherosclerosis of native coronary artery     Dense coronary atherosclerosis by chest CT November 2014   . History of chicken pox   . History of [place] measles   . Sciatica   . Collagen vascular disease (HCC)   . Asthma     Past Surgical History  Procedure Laterality Date  . Colon surgery    . Abdominal surgery    . Tonsillectomy    . Circumcision    . Shoulder open rotator cuff repair Right 06/12/2014   Procedure: OPEN REPAIR ROTATOR CUFF RIGHT ;  Surgeon: Darreld Mclean, MD;  Location: [facility];  Service: Orthopedics;  Laterality: Right;  . Shoulder acromioplasty Right 06/12/2014    Procedure: RIGHT SHOULDER ACROMIOPLASTY;  Surgeon: Darreld Mclean, MD;  Location: [facility];  Service: Orthopedics;  Laterality: Right;    Family History  Problem Relation Age of Onset  . COPD Father   . Cancer Father   . Cancer Mother   . Heart attack Brother   . Cancer Sister     Social History Social History  Substance Use Topics  . Smoking status: Current Every Day Smoker -- 1.00 packs/day for 21 years    Types: Cigarettes    Start date: 08/28/1969  . Smokeless tobacco: Never Used  . Alcohol Use: No    Allergies  Allergen Reactions  . Morphine And Related Itching    Current Outpatient Prescriptions  Medication Sig Dispense Refill  . ALPRAZolam (XANAX) 1 MG tablet Take 1 mg by mouth at bedtime as needed for sleep.    Marland Kitchen amLODipine (NORVASC) 5 MG tablet TAKE 1 TABLET BY MOUTH ONCE DAILY. 90 tablet 6  . aspirin EC 81 MG tablet Take 1 tablet (81 mg total) by mouth daily. 90 tablet 3  . gabapentin (NEURONTIN) 400 MG capsule Take 400 mg by mouth 3 (three) times daily.    Marland Kitchen ipratropium-albuterol (DUONEB) 0.5-2.5 (3) MG/3ML  SOLN Take 3 mLs by nebulization every 6 (six) hours as needed. 360 mL 0  . metoprolol succinate (TOPROL-XL) 25 MG 24 hr tablet TAKE 1 TABLET BY MOUTH TWICE DAILY 60 tablet 6  . naproxen (NAPROSYN) 500 MG tablet Take 1 tablet by mouth 2 (two) times daily.    . nitroGLYCERIN (NITROSTAT) 0.4 MG SL tablet Place 1 tablet (0.4 mg total) under the tongue every 5 (five) minutes as needed for chest pain. 25 tablet 3  . oxyCODONE-acetaminophen (PERCOCET) 7.5-325 MG tablet One tablet every four to six hours as needed for pain.  Must last one month. 100 tablet 0  . temazepam (RESTORIL) 30 MG capsule Take 30 mg by mouth at bedtime as needed for sleep.     No current facility-administered  medications for this visit.     Physical Exam  Blood pressure 131/78, pulse 70, temperature 98.6 F (37 C), height 5\' 10"  (1.778 m), weight 182 lb 9.6 oz (82.827 kg).  Constitutional: overall normal hygiene, normal nutrition, well developed, normal grooming, normal body habitus. Assistive device:none  Musculoskeletal: gait and station Limp none, muscle tone and strength are normal, no tremors or atrophy is present.  .  Neurological: coordination overall normal.  Deep tendon reflex/nerve stretch intact.  Sensation normal.  Cranial nerves II-XII intact.   Skin:   Has scar of the right shoulder, otherwise overall no scars, lesions, ulcers or rashes. No psoriasis.  Psychiatric: Alert and oriented x 3.  Recent memory intact, remote memory unclear.  Normal mood and affect. Well groomed.  Good eye contact.  Cardiovascular: overall no swelling, no varicosities, no edema bilaterally, normal temperatures of the legs and arms, no clubbing, cyanosis and good capillary refill.  Lymphatic: palpation is normal.  Examination of right Upper Extremity is done.  Inspection:   Overall:  Elbow non-tender without crepitus or defects, forearm non-tender without crepitus or defects, wrist non-tender without crepitus or defects, hand non-tender.    Shoulder: with glenohumeral joint tenderness, without effusion.   Upper arm: without swelling and tenderness   Range of motion:   Overall:  Full range of motion of the elbow, full range of motion of wrist and full range of motion in fingers.   Shoulder:  right  180 degrees forward flexion; 160 degrees abduction; 35 degrees internal rotation, 35 degrees external rotation, 25 degrees extension, 40 degrees adduction.   Stability:   Overall:  Shoulder, elbow and wrist stable   Strength and Tone:   Overall full shoulder muscles strength, full upper arm strength and normal upper arm bulk and tone.    Additional services performed: I have discussed with him  about his smoking.  He has cut back from 1 1/2 packs a day to one pack per day.  I have asked that he continue to decrease this until he stops.  He says he will try.  His blood pressure is well controlled and he is taking his medicine.  He has had coughing episodes secondary to his COPD but it has been less this winter.  Again, cutting back on smoking will help this.  The patient has been educated about the nature of the problem(s) and counseled on treatment options.  The patient appeared to understand what I have discussed and is in agreement with it.  Encounter Diagnosis  Name Primary?  . Right shoulder pain Yes    PLAN Call if any problems.  Precautions discussed.  Continue current medications.   Return to clinic three months

## 2015-12-30 ENCOUNTER — Ambulatory Visit (INDEPENDENT_AMBULATORY_CARE_PROVIDER_SITE_OTHER): Payer: Medicare Other | Admitting: Cardiology

## 2015-12-30 ENCOUNTER — Encounter: Payer: Self-pay | Admitting: Cardiology

## 2015-12-30 VITALS — BP 114/66 | HR 95 | Ht 70.0 in | Wt 174.0 lb

## 2015-12-30 DIAGNOSIS — I10 Essential (primary) hypertension: Secondary | ICD-10-CM | POA: Diagnosis not present

## 2015-12-30 DIAGNOSIS — Z720 Tobacco use: Secondary | ICD-10-CM | POA: Diagnosis not present

## 2015-12-30 DIAGNOSIS — I2510 Atherosclerotic heart disease of native coronary artery without angina pectoris: Secondary | ICD-10-CM | POA: Diagnosis not present

## 2015-12-30 NOTE — Patient Instructions (Signed)
Your physician wants you to follow-up in: 6 months with Dr McDowell You will receive a reminder letter in the mail two months in advance. If you don't receive a letter, please call our office to schedule the follow-up appointment.     Your physician recommends that you continue on your current medications as directed. Please refer to the Current Medication list given to you today.    If you need a refill on your cardiac medications before your next appointment, please call your pharmacy.     Thank you for choosing [facility] !        

## 2015-12-30 NOTE — Progress Notes (Signed)
Cardiology Office Note  Date: 12/30/2015   ID: Peter Campbell, DOB 12/14/1955, MRN [id number]  [facility]  Primary Cardiologist: Nona DellSamuel McDowell, MD   Chief Complaint  Patient presents with  . Coronary Artery Disease    History of Present Illness: Peter Campbell is a 60 y.o. male last seen in October 2016. He presents for a routine follow-up visit. Reports no angina or nitroglycerin use. He has been trying to stop smoking, intermittently quits for a few weeks of time. He is now trying to use nicotine replacement therapy. He reports NYHA class II dyspnea, intermittent chest congestion.  I reviewed his medications. Cardiac regimen includes aspirin, Norvasc, Toprol-XL, and as needed nitroglycerin. He is not on statin therapy, LDL was 26 as of March 2016.  Enjoys working outside in his garden. Also continues to play country and [employer] music with friends, previously worked in [employer].  Cardiolite study from January 2015 showed no active ischemia with LVEF 63%.  Past Medical History  Diagnosis Date  . Essential hypertension, benign   . Chronic back pain   . Essential tremor   . Coronary atherosclerosis of native coronary artery     Dense coronary atherosclerosis by chest CT November 2014   . History of chicken pox   . History of [place] measles   . Sciatica   . Collagen vascular disease (HCC)   . Asthma     Past Surgical History  Procedure Laterality Date  . Colon surgery    . Abdominal surgery    . Tonsillectomy    . Circumcision    . Shoulder open rotator cuff repair Right 06/12/2014    Procedure: OPEN REPAIR ROTATOR CUFF RIGHT ;  Surgeon: Darreld McleanWayne Keeling, MD;  Location: [facility];  Service: Orthopedics;  Laterality: Right;  . Shoulder acromioplasty Right 06/12/2014    Procedure: RIGHT SHOULDER ACROMIOPLASTY;  Surgeon: Darreld McleanWayne Keeling, MD;  Location: [facility];  Service: Orthopedics;  Laterality: Right;    Current Outpatient Prescriptions    Medication Sig Dispense Refill  . ALPRAZolam (XANAX) 0.25 MG tablet     . amLODipine (NORVASC) 5 MG tablet TAKE 1 TABLET BY MOUTH ONCE DAILY. 90 tablet 6  . aspirin EC 81 MG tablet Take 1 tablet (81 mg total) by mouth daily. 90 tablet 3  . gabapentin (NEURONTIN) 400 MG capsule Take 400 mg by mouth 3 (three) times daily.    Marland Kitchen. ipratropium-albuterol (DUONEB) 0.5-2.5 (3) MG/3ML SOLN Take 3 mLs by nebulization every 6 (six) hours as needed. 360 mL 0  . metoprolol succinate (TOPROL-XL) 25 MG 24 hr tablet TAKE 1 TABLET BY MOUTH TWICE DAILY 60 tablet 6  . naproxen (NAPROSYN) 500 MG tablet Take 1 tablet by mouth 2 (two) times daily.    . nitroGLYCERIN (NITROSTAT) 0.4 MG SL tablet Place 1 tablet (0.4 mg total) under the tongue every 5 (five) minutes as needed for chest pain. 25 tablet 3  . oxyCODONE-acetaminophen (PERCOCET) 7.5-325 MG tablet One tablet every four to six hours as needed for pain.  Must last one month. 100 tablet 0  . temazepam (RESTORIL) 30 MG capsule Take 30 mg by mouth at bedtime as needed for sleep.     No current facility-administered medications for this visit.   Allergies:  Morphine and related   Social History: The patient  reports that he has been smoking Cigarettes.  He started smoking about 46 years ago. He has a 21 pack-year smoking history. He  has never used smokeless tobacco. He reports that he does not drink alcohol or use illicit drugs.   ROS:  Please see the history of present illness. Otherwise, complete review of systems is positive for arthritic symptoms and tremor.  All other systems are reviewed and negative.   Physical Exam: VS:  BP 114/66 mmHg  Pulse 95  Ht  (1.778 m)  Wt 174 lb (78.926 kg)  BMI 24.97 kg/m2  SpO2 99%, BMI Body mass index is 24.97 kg/(m^2).  Wt Readings from Last 3 Encounters:  12/30/15 174 lb (78.926 kg)  12/03/15 182 lb 9.6 oz (82.827 kg)  07/01/15 167 lb (75.751 kg)    The patient appears comfortable at rest.  HEENT:  Conjunctiva and lids normal, oropharynx clear with moist mucosa.  Neck: Supple, no elevated JVP or carotid bruits, no thyromegaly.  Lungs: Clear to auscultation, nonlabored breathing at rest.  Cardiac: Regular rate and rhythm, no S3, soft systolic murmur, no pericardial rub.  Abdomen: Soft, nontender, bowel sounds present, no guarding or rebound.  Extremities: No pitting edema, distal pulses 1-2+.   ECG: I personally reviewed the prior tracing from 06/13/2014 which showed sinus rhythm with tremor artifact.  Recent Labwork: 06/19/2015: ALT 10*; AST 12*; B Natriuretic Peptide 65.0; BUN 28*; Creatinine, Ser 0.87; Hemoglobin 13.4; Platelets 167; Potassium 4.0; Sodium 137     Component Value Date/Time   CHOL 100 12/03/2014 0735   TRIG 279* 12/03/2014 0735   HDL 18* 12/03/2014 0735   CHOLHDL 5.6 12/03/2014 0735   VLDL 56* 12/03/2014 0735   LDLCALC 26 12/03/2014 0735    Other Studies Reviewed Today:  Lexiscan Cardiolite 10/03/2013: FINDINGS: The patient was stressed according to Lexiscan protocol for 3 min and 14 seconds. The resting heart rate of 65 beats per min rose to a maximal heart rate of 99 beats per min. This value represents 60% of the maximal, age predicted heart rate. The resting blood pressure of 122/62 mm of mercury rose to a maximum blood pressure of 127/70 mm of mercury. The stress test was stopped due to protocol completion.  The resting ECG shows normal sinus rhythm at a rate of 76 beats per min. With stress there were no diagnostic ST T-wave abnormalities nor any arrhythmias noted.  Analysis of the raw data showed inferior wall soft tissue attenuation. Analysis of the nuclear images revealed fixed defects in the inferoapical, mid inferior, and basal inferior walls. They were moderate in size and mild in severity. However, there was normal wall motion in all wall segments. This would indicate that these defects are due to soft tissue attenuation artifact.  Left ventricular systolic function was normal with a calculated LVEF of 63%.  IMPRESSION: 1. Normal Lexiscan Cardiolite stress test.  2. No evidence of ischemia or scar.  3. Soft tissue attenuation artifact noted.  4. Normal left ventricular systolic function, calculated LVEF 63%.  Assessment and Plan:  1. Coronary atherosclerosis by chest CT in November 2014. Subsequent Cardiolite study showed no active ischemia. He is not reporting any angina or nitroglycerin use. Plan to continue aspirin and beta blocker. He is trying to quit smoking as well. Not on statin with LDL 26 from last year.  2. Ongoing tobacco abuse, we discussed smoking cessation strategies today.  3. Essential hypertension, blood pressure is normal today.  Current medicines were reviewed with the patient today.  Disposition: FU with me in 6 months.   Signed, Jonelle Sidle, MD, [facility] 12/30/2015 8:47 AM  [facility] at [facility] [address] [place], [place] [address] Phone: [phone]; Fax: [phone]

## 2016-01-13 ENCOUNTER — Telehealth: Payer: Self-pay | Admitting: Orthopaedic Surgery

## 2016-01-13 NOTE — Telephone Encounter (Signed)
Oxycodone-Acetaminophen 7.5/325mg  Qty 100 Tablets °

## 2016-01-14 MED ORDER — OXYCODONE-ACETAMINOPHEN 7.5-325 MG PO TABS: ORAL_TABLET | ORAL | Status: DC

## 2016-01-14 NOTE — Telephone Encounter (Signed)
Rx done. 

## 2016-01-31 ENCOUNTER — Other Ambulatory Visit: Payer: Self-pay | Admitting: Cardiology

## 2016-02-18 ENCOUNTER — Ambulatory Visit: Payer: Self-pay | Admitting: Orthopaedic Surgery

## 2016-02-18 ENCOUNTER — Encounter: Payer: Self-pay | Admitting: Orthopaedic Surgery

## 2016-02-18 ENCOUNTER — Ambulatory Visit (INDEPENDENT_AMBULATORY_CARE_PROVIDER_SITE_OTHER): Payer: Medicare Other | Admitting: Orthopaedic Surgery

## 2016-02-18 VITALS — BP 147/86 | HR 77 | Temp 98.1°F | Ht 68.0 in | Wt 178.0 lb

## 2016-02-18 DIAGNOSIS — M25511 Pain in right shoulder: Secondary | ICD-10-CM | POA: Diagnosis not present

## 2016-02-18 DIAGNOSIS — I10 Essential (primary) hypertension: Secondary | ICD-10-CM

## 2016-02-18 MED ORDER — OXYCODONE-ACETAMINOPHEN 7.5-325 MG PO TABS: ORAL_TABLET | ORAL | Status: DC

## 2016-02-18 NOTE — Progress Notes (Signed)
CC:  My right shoulder is hurting again.  He has chronic pain of the right shoulder.  He has pain lifting it.  He has no new trauma.  He has no paresthesias.  NV is intact to the right upper extremity.  He has decreased motion.  Encounter Diagnoses  Name Primary?  . Right shoulder pain Yes  . Essential hypertension, benign     He requests injection of the shoulder on the right.  PROCEDURE NOTE:  The patient request injection, verbal consent was obtained.  The right shoulder was prepped appropriately after time out was performed.   Sterile technique was observed and injection of 1 cc of Depo-Medrol 40 mg with several cc's of plain xylocaine. Anesthesia was provided by ethyl chloride and a 20-gauge needle was used to inject the shoulder area. A posterior approach was used.  The injection was tolerated well.  A band aid dressing was applied.  The patient was advised to apply ice later today and tomorrow to the injection sight as needed.  Return to regularly scheduled visit in June.  Call if any problem.

## 2016-03-04 ENCOUNTER — Encounter: Payer: Self-pay | Admitting: Orthopaedic Surgery

## 2016-03-04 ENCOUNTER — Ambulatory Visit (INDEPENDENT_AMBULATORY_CARE_PROVIDER_SITE_OTHER): Payer: Medicare Other | Admitting: Orthopaedic Surgery

## 2016-03-04 VITALS — BP 129/81 | HR 78 | Temp 98.2°F | Ht 68.5 in | Wt 179.6 lb

## 2016-03-04 DIAGNOSIS — M25511 Pain in right shoulder: Secondary | ICD-10-CM | POA: Diagnosis not present

## 2016-03-04 DIAGNOSIS — I10 Essential (primary) hypertension: Secondary | ICD-10-CM

## 2016-03-04 DIAGNOSIS — I2510 Atherosclerotic heart disease of native coronary artery without angina pectoris: Secondary | ICD-10-CM

## 2016-03-04 NOTE — Progress Notes (Signed)
Patient [name], male DOB:02/02/1956, 60 y.o. ZHY:[id number]  Chief Complaint  Patient presents with  . Follow-up    Right shoulder    HPI  Peter Campbell Posey is Campbell 60 y.o. male who has right shoulder pain. He is much improved after the injection last time.  He has less pain, more motion. He still has tenderness with overhead use.  He has no new trauma, no paresthesias.  He is doing his exercises.  HPI  Body mass index is 26.91 kg/(m^2).  ROS  Review of Systems  Constitutional:       Patient does not have Diabetes Mellitus. Patient has hypertension. Patient has COPD or shortness of breath. Patient does not have BMI > 35. Patient has current smoking history.  HENT: Negative for congestion.   Respiratory: Positive for cough and shortness of breath.   Cardiovascular: Negative for chest pain.  Endocrine: Positive for cold intolerance.  Musculoskeletal: Positive for myalgias, back pain and arthralgias.  Allergic/Immunologic: Negative for environmental allergies.    Past Medical History  Diagnosis Date  . Essential hypertension, benign   . Chronic back pain   . Essential tremor   . Coronary atherosclerosis of native coronary artery     Dense coronary atherosclerosis by chest CT November 2014   . History of chicken pox   . History of [place]   . Sciatica   . Collagen vascular disease (HCC)   . Asthma     Past Surgical History  Procedure Laterality Date  . Colon surgery    . Abdominal surgery    . Tonsillectomy    . Circumcision    . Shoulder open rotator cuff repair Right 06/12/2014    Procedure: OPEN REPAIR ROTATOR CUFF RIGHT ;  Surgeon: Darreld McleanWayne Campbell Ridolfi, MD;  Location: [facility];  Service: Orthopedics;  Laterality: Right;  . Shoulder acromioplasty Right 06/12/2014    Procedure: RIGHT SHOULDER ACROMIOPLASTY;  Surgeon: Darreld McleanWayne Taura Lamarre, MD;  Location: [facility];  Service: Orthopedics;  Laterality: Right;    Family History  Problem Relation Age of Onset  . COPD Father   .  Cancer Father   . Cancer Mother   . Heart attack Brother   . Cancer Sister     Social History Social History  Substance Use Topics  . Smoking status: Current Every Day Smoker -- 1.00 packs/day for 21 years    Types: Cigarettes    Start date: 08/28/1969  . Smokeless tobacco: Never Used  . Alcohol Use: No    Allergies  Allergen Reactions  . Morphine And Related Itching    Current Outpatient Prescriptions  Medication Sig Dispense Refill  . amLODipine (NORVASC) 5 MG tablet TAKE 1 TABLET BY MOUTH ONCE DAILY. 90 tablet 6  . aspirin EC 81 MG tablet Take 1 tablet (81 mg total) by mouth daily. 90 tablet 3  . gabapentin (NEURONTIN) 400 MG capsule Take 400 mg by mouth 3 (three) times daily.    Marland Kitchen. ipratropium-albuterol (DUONEB) 0.5-2.5 (3) MG/3ML SOLN Take 3 mLs by nebulization every 6 (six) hours as needed. 360 mL 0  . metoprolol succinate (TOPROL-XL) 25 MG 24 hr tablet TAKE 1 TABLET BY MOUTH TWICE DAILY 60 tablet 6  . naproxen (NAPROSYN) 500 MG tablet Take 1 tablet by mouth 2 (two) times daily.    . nitroGLYCERIN (NITROSTAT) 0.4 MG SL tablet Place 1 tablet (0.4 mg total) under the tongue every 5 (five) minutesas needed for chest pain. 25 tablet 3  . oxyCODONE-acetaminophen (PERCOCET) 7.5-325 MG  tablet One tablet every four to six hours as needed for pain.  Must last one month. 100 tablet 0  . temazepam (RESTORIL) 30 MG capsule Take 30 mg by mouth at bedtime as needed for sleep.     No current facility-administered medications for this visit.     Physical Exam  Blood pressure 129/81, pulse 78, temperature 98.2 F (36.8 C), height 5' 8.5" (1.74 m), weight 179 lb 9.6 oz (81.466 kg).  Constitutional: overall normal hygiene, normal nutrition, well developed, normal grooming, normal body habitus. Assistive device:none  Musculoskeletal: gait and station Limp none, muscle tone and strength are normal, no tremors or atrophy is present.  .  Neurological: coordination overall normal.  Deep  tendon reflex/nerve stretch intact.  Sensation normal.  Cranial nerves II-XII intact.   Skin:   normal overall no scars, lesions, ulcers or rashes. No psoriasis.  Psychiatric: Alert and oriented x 3.  Recent memory intact, remote memory unclear.  Normal mood and affect. Well groomed.  Good eye contact.  Cardiovascular: overall no swelling, no varicosities, no edema bilaterally, normal temperatures of the legs and arms, no clubbing, cyanosis and good capillary refill.  Lymphatic: palpation is normal.  Examination of right Upper Extremity is done.  Inspection:   Overall:  Elbow non-tender without crepitus or defects, forearm non-tender without crepitus or defects, wrist non-tender without crepitus or defects, hand non-tender.    Shoulder: with glenohumeral joint tenderness, without effusion.   Upper arm: without swelling and tenderness   Range of motion:   Overall:  Full range of motion of the elbow, full range of motion of wrist and full range of motion in fingers.   Shoulder:  right  180 degrees forward flexion; 160 degrees abduction; 35 degrees internal rotation, 35 degrees external rotation, 20 degrees extension, 40 degrees adduction.   Stability:   Overall:  Shoulder, elbow and wrist stable   Strength and Tone:   Overall full shoulder muscles strength, full upper arm strength and normal upper arm bulk and tone.   The patient has been educated about the nature of the problem(s) and counseled on treatment options.  The patient appeared to understand what I have discussed and is in agreement with it.  Encounter Diagnoses  Name Primary?  . Right shoulder pain Yes  . Essential hypertension, benign     PLAN Call if any problems.  Precautions discussed.  Continue current medications.   Return to clinic 6 weeks   Electronically Signed Darreld Mclean, MD 6/7/201710:26 AM

## 2016-03-23 ENCOUNTER — Telehealth: Payer: Self-pay | Admitting: Orthopaedic Surgery

## 2016-03-23 MED ORDER — OXYCODONE-ACETAMINOPHEN 7.5-325 MG PO TABS: ORAL_TABLET | ORAL | Status: DC

## 2016-03-23 NOTE — Telephone Encounter (Signed)
Rx done. 

## 2016-03-23 NOTE — Telephone Encounter (Signed)
Patient called and requested a refill on Oxycodone-Acetaminophen (Percocet) 7.5-325 mgs.  Qty  100   Sig:     One tablet every four to six hours as needed for pain.

## 2016-03-26 ENCOUNTER — Telehealth: Payer: Self-pay | Admitting: Cardiology

## 2016-03-26 NOTE — Telephone Encounter (Signed)
Returned pt call, no answer. lmtcb 6/29

## 2016-03-26 NOTE — Telephone Encounter (Signed)
Patient thinks he has taken too much medication. Would like to speak with nurse regarding this. / tg

## 2016-03-26 NOTE — Telephone Encounter (Signed)
Patient states he may have taken extra 5 mg amlodipine this am.He feels fine, no c/o dizziness,lightheadnesss,etc.He is in a field working,has shade and fluids.I asked him to be aware of any change and call back if he felt different,pt agrees with plan

## 2016-04-15 ENCOUNTER — Ambulatory Visit (INDEPENDENT_AMBULATORY_CARE_PROVIDER_SITE_OTHER): Payer: Medicare Other | Admitting: Orthopaedic Surgery

## 2016-04-15 ENCOUNTER — Encounter: Payer: Self-pay | Admitting: Orthopaedic Surgery

## 2016-04-15 VITALS — BP 126/80 | HR 68 | Temp 97.9°F | Ht 69.0 in | Wt 179.2 lb

## 2016-04-15 DIAGNOSIS — I10 Essential (primary) hypertension: Secondary | ICD-10-CM

## 2016-04-15 DIAGNOSIS — I2510 Atherosclerotic heart disease of native coronary artery without angina pectoris: Secondary | ICD-10-CM | POA: Diagnosis not present

## 2016-04-15 DIAGNOSIS — M25511 Pain in right shoulder: Secondary | ICD-10-CM | POA: Diagnosis not present

## 2016-04-15 NOTE — Progress Notes (Signed)
Patient Peter Campbell, male DOB:06/16/1956, 60 y.o. [id number]  Chief Complaint  Patient presents with  . Follow-up    Right Shoulder    HPI  [name] is a 60 y.o. male who has right shoulder pain.  He is doing well.  He is doing his exercises.  He has pain with overhead use.  He has no paresthesias.  He is taking his medicine.  He has no new trauma.  HPI  Body mass index is 26.45 kg/(m^2).  ROS  Review of Systems  Constitutional:       Patient does not have Diabetes Mellitus. Patient has hypertension. Patient has COPD or shortness of breath. Patient does not have BMI > 35. Patient has current smoking history.  HENT: Negative for congestion.   Respiratory: Positive for cough and shortness of breath.   Cardiovascular: Negative for chest pain.  Endocrine: Positive for cold intolerance.  Musculoskeletal: Positive for myalgias, back pain and arthralgias.  Allergic/Immunologic: Negative for environmental allergies.    Past Medical History  Diagnosis Date  . Essential hypertension, benign   . Chronic back pain   . Essential tremor   . Coronary atherosclerosis of native coronary artery     Dense coronary atherosclerosis by chest CT November 2014   . History of chicken pox   . History of [place] measles   . Sciatica   . Collagen vascular disease (HCC)   . Asthma     Past Surgical History  Procedure Laterality Date  . Colon surgery    . Abdominal surgery    . Tonsillectomy    . Circumcision    . Shoulder open rotator cuff repair Right 06/12/2014    Procedure: OPEN REPAIR ROTATOR CUFF RIGHT ;  Surgeon: Darreld Mclean, MD;  Location: [facility];  Service: Orthopedics;  Laterality: Right;  . Shoulder acromioplasty Right 06/12/2014    Procedure: RIGHT SHOULDER ACROMIOPLASTY;  Surgeon: Darreld Mclean, MD;  Location: [facility];  Service: Orthopedics;  Laterality: Right;    Family History  Problem Relation Age of Onset  . COPD Father   . Cancer Father   . Cancer Mother   .  Heart attack Brother   . Cancer Sister     Social History Social History  Substance Use Topics  . Smoking status: Current Every Day Smoker -- 1.00 packs/day for 21 years    Types: Cigarettes    Start date: 08/28/1969  . Smokeless tobacco: Never Used  . Alcohol Use: No    Allergies  Allergen Reactions  . Morphine And Related Itching    Current Outpatient Prescriptions  Medication Sig Dispense Refill  . amLODipine (NORVASC) 5 MG tablet TAKE 1 TABLET BY MOUTH ONCE DAILY. 90 tablet 6  . aspirin EC 81 MG tablet Take 1 tablet (81 mg total) by mouth daily. 90 tablet 3  . gabapentin (NEURONTIN) 400 MG capsule Take 400 mg by mouth 3 (three) times daily.    Marland Kitchen ipratropium-albuterol (DUONEB) 0.5-2.5 (3) MG/3ML SOLN Take 3 mLs by nebulization every 6 (six) hours as needed. 360 mL 0  . metoprolol succinate (TOPROL-XL) 25 MG 24 hr tablet TAKE 1 TABLET BY MOUTH TWICE DAILY 60 tablet 6  . naproxen (NAPROSYN) 500 MG tablet Take 1 tablet by mouth 2 (two) times daily.    . nitroGLYCERIN (NITROSTAT) 0.4 MG SL tablet Place 1 tablet (0.4 mg total) under the tongue every 5 (five) minutesas needed for chest pain. 25 tablet 3  . oxyCODONE-acetaminophen (PERCOCET) 7.5-325 MG tablet One  tablet every four to six hours as needed for pain.  Must last one month. 100 tablet 0  . temazepam (RESTORIL) 30 MG capsule Take 30 mg by mouth at bedtime as needed for sleep.     No current facility-administered medications for this visit.     Physical Exam  Blood pressure 126/80, pulse 68, temperature 97.9 F (36.6 C), height 5\' 9"  (1.753 m), weight 179 lb 3.2 oz (81.285 kg).  Constitutional: overall normal hygiene, normal nutrition, well developed, normal grooming, normal body habitus. Assistive device:none  Musculoskeletal: gait and station Limp none, muscle tone and strength are normal, no tremors or atrophy is present.  .  Neurological: coordination overall normal.  Deep tendon reflex/nerve stretch intact.   Sensation normal.  Cranial nerves II-XII intact.   Skin:   normal overall no scars, lesions, ulcers or rashes. No psoriasis.  Psychiatric: Alert and oriented x 3.  Recent memory intact, remote memory unclear.  Normal mood and affect. Well groomed.  Good eye contact.  Cardiovascular: overall no swelling, no varicosities, no edema bilaterally, normal temperatures of the legs and arms, no clubbing, cyanosis and good capillary refill.  Lymphatic: palpation is normal.  Examination of right Upper Extremity is done.  Inspection:   Overall:  Elbow non-tender without crepitus or defects, forearm non-tender without crepitus or defects, wrist non-tender without crepitus or defects, hand non-tender.    Shoulder: with glenohumeral joint tenderness, without effusion.   Upper arm: without swelling and tenderness   Range of motion:   Overall:  Full range of motion of the elbow, full range of motion of wrist and full range of motion in fingers.   Shoulder:  right  165 degrees forward flexion; 145 degrees abduction; 40 degrees internal rotation, 40 degrees external rotation, 20 degrees extension, 40 degrees adduction.   Stability:   Overall:  Shoulder, elbow and wrist stable   Strength and Tone:   Overall full shoulder muscles strength, full upper arm strength and normal upper arm bulk and tone.   The patient has been educated about the nature of the problem(s) and counseled on treatment options.  The patient appeared to understand what I have discussed and is in agreement with it.  Encounter Diagnoses  Name Primary?  . Right shoulder pain Yes  . Essential hypertension, benign     PLAN Call if any problems.  Precautions discussed.  Continue current medications.   Return to clinic 2 months   Electronically Signed Darreld McleanWayne Arienne Gartin, MD 7/19/20178:52 AM

## 2016-04-20 ENCOUNTER — Telehealth: Payer: Self-pay | Admitting: Orthopaedic Surgery

## 2016-04-20 NOTE — Telephone Encounter (Signed)
Patient requests refill on: oxyCODONE-acetaminophen (PERCOCET) 7.5-325 MG tablet [[id number]] - quantity 100  -

## 2016-04-21 MED ORDER — HYDROCODONE-ACETAMINOPHEN 5-325 MG PO TABS: 1.0000 | ORAL_TABLET | ORAL | 0 refills | Status: DC | PRN

## 2016-05-16 ENCOUNTER — Telehealth: Payer: Self-pay | Admitting: Orthopaedic Surgery

## 2016-05-25 ENCOUNTER — Telehealth: Payer: Self-pay | Admitting: Orthopaedic Surgery

## 2016-05-25 MED ORDER — OXYCODONE-ACETAMINOPHEN 5-325 MG PO TABS: 1.0000 | ORAL_TABLET | Freq: Four times a day (QID) | ORAL | 0 refills | Status: DC | PRN

## 2016-05-25 NOTE — Telephone Encounter (Signed)
Hydrocodone-Acetaminophen  5/325mg  Qty 120 Tablets °

## 2016-06-02 ENCOUNTER — Other Ambulatory Visit: Payer: Self-pay | Admitting: Cardiology

## 2016-06-18 ENCOUNTER — Ambulatory Visit: Payer: Medicare Other | Admitting: Orthopaedic Surgery

## 2016-06-18 ENCOUNTER — Telehealth: Payer: Self-pay | Admitting: Orthopaedic Surgery

## 2016-06-18 MED ORDER — NAPROXEN 500 MG PO TABS: ORAL_TABLET | ORAL | 5 refills | Status: DC

## 2016-06-18 MED ORDER — HYDROCODONE-ACETAMINOPHEN 5-325 MG PO TABS: 1.0000 | ORAL_TABLET | Freq: Four times a day (QID) | ORAL | 0 refills | Status: DC | PRN

## 2016-06-18 NOTE — Telephone Encounter (Signed)
Hydrocodone-Acetaminophen 5/325mg   Qty 120 Tablets  Naproxen 500 mg Qty 60 Tablets

## 2016-06-24 ENCOUNTER — Ambulatory Visit (INDEPENDENT_AMBULATORY_CARE_PROVIDER_SITE_OTHER): Payer: Medicare Other | Admitting: Orthopaedic Surgery

## 2016-06-24 ENCOUNTER — Ambulatory Visit (INDEPENDENT_AMBULATORY_CARE_PROVIDER_SITE_OTHER): Payer: Medicare Other

## 2016-06-24 ENCOUNTER — Encounter: Payer: Self-pay | Admitting: Orthopaedic Surgery

## 2016-06-24 VITALS — BP 149/95 | HR 78 | Temp 98.1°F | Ht 69.0 in | Wt 177.0 lb

## 2016-06-24 DIAGNOSIS — M25551 Pain in right hip: Secondary | ICD-10-CM

## 2016-06-24 DIAGNOSIS — M7061 Trochanteric bursitis, right hip: Secondary | ICD-10-CM

## 2016-06-24 DIAGNOSIS — M25511 Pain in right shoulder: Secondary | ICD-10-CM

## 2016-06-24 DIAGNOSIS — I2510 Atherosclerotic heart disease of native coronary artery without angina pectoris: Secondary | ICD-10-CM | POA: Diagnosis not present

## 2016-06-24 DIAGNOSIS — I10 Essential (primary) hypertension: Secondary | ICD-10-CM

## 2016-06-24 MED ORDER — PREDNISONE 5 MG (21) PO TBPK: ORAL_TABLET | ORAL | 0 refills | Status: DC

## 2016-06-24 NOTE — Progress Notes (Signed)
Patient Peter Campbell, male DOB:28-Jan-1956, 60 y.o. [id number]  Chief Complaint  Patient presents with  . Follow-up    right shoulder pain and right hip pain    HPI  Peter Campbell is a 60 y.o. male who has right shoulder pain. It is chronic and stable. He has no new trauma, no paresthesias.  He has developed pain of the right hip. He has tried to work a job but had to stop after six weeks because of right hip pain.  He has no trauma, no redness.  He had lateral swelling of the hip.  It went away after he stopped working.  He can walk well. He tried heat which helped some.  He has a history of lower back pain chronic but it has been very stable. HPI  Body mass index is 26.14 kg/m.  ROS  Review of Systems  Constitutional:       Patient does not have Diabetes Mellitus. Patient has hypertension. Patient has COPD or shortness of breath. Patient does not have BMI > 35. Patient has current smoking history.  HENT: Negative for congestion.   Respiratory: Positive for cough and shortness of breath.   Cardiovascular: Negative for chest pain.  Endocrine: Positive for cold intolerance.  Musculoskeletal: Positive for arthralgias, back pain and myalgias.  Allergic/Immunologic: Negative for environmental allergies.    Past Medical History:  Diagnosis Date  . Asthma   . Chronic back pain   . Collagen vascular disease (HCC)   . Coronary atherosclerosis of native coronary artery    Dense coronary atherosclerosis by chest CT November 2014   . Essential hypertension, benign   . Essential tremor   . History of chicken pox   . History of [place] measles   . Sciatica     Past Surgical History:  Procedure Laterality Date  . ABDOMINAL SURGERY    . CIRCUMCISION    . COLON SURGERY    . SHOULDER ACROMIOPLASTY Right 06/12/2014   Procedure: RIGHT SHOULDER ACROMIOPLASTY;  Surgeon: Darreld Mclean, MD;  Location: [facility];  Service: Orthopedics;  Laterality: Right;  . SHOULDER OPEN ROTATOR CUFF  REPAIR Right 06/12/2014   Procedure: OPEN REPAIR ROTATOR CUFF RIGHT ;  Surgeon: Darreld Mclean, MD;  Location: [facility];  Service: Orthopedics;  Laterality: Right;  . TONSILLECTOMY      Family History  Problem Relation Age of Onset  . COPD Father   . Cancer Father   . Cancer Mother   . Heart attack Brother   . Cancer Sister     Social History Social History  Substance Use Topics  . Smoking status: Current Every Day Smoker    Packs/day: 1.00    Years: 21.00    Types: Cigarettes    Start date: 08/28/1969  . Smokeless tobacco: Never Used  . Alcohol use No    Allergies  Allergen Reactions  . Morphine And Related Itching    Current Outpatient Prescriptions  Medication Sig Dispense Refill  . amLODipine (NORVASC) 5 MG tablet TAKE 1 TABLET BY MOUTH ONCE DAILY. 90 tablet 6  . aspirin EC 81 MG tablet Take 1 tablet (81 mg total) by mouth daily. 90 tablet 3  . gabapentin (NEURONTIN) 400 MG capsule Take 400 mg by mouth 3 (three) times daily.    Marland Kitchen HYDROcodone-acetaminophen (NORCO/VICODIN) 5-325 MG tablet Take 1 tablet by mouth every 6 (six) hours as needed for moderate pain (Must last 30 days.Do not take and drive a car or use machinery.). 110 tablet  0  . ipratropium-albuterol (DUONEB) 0.5-2.5 (3) MG/3ML SOLN Take 3 mLs by nebulization every 6 (six) hours as needed. 360 mL 0  . metoprolol succinate (TOPROL-XL) 25 MG 24 hr tablet TAKE 1 TABLET BY MOUTH TWICE DAILY 60 tablet 6  . naproxen (NAPROSYN) 500 MG tablet TAKE (1) TABLET BY MOUTH TWICE A DAY WITH MEALS (BREAKFAST AND SUPPER) 60 tablet 5  . nitroGLYCERIN (NITROSTAT) 0.4 MG SL tablet Place 1 tablet (0.4 mg total) under the tongue every 5 (five) minutesas needed for chest pain. 25 tablet 3  . oxyCODONE-acetaminophen (PERCOCET) 7.5-325 MG tablet One tablet every four to six hours as needed for pain.  Must last one month. 100 tablet 0  . temazepam (RESTORIL) 30 MG capsule Take 30 mg by mouth at bedtime as needed for sleep.    .  predniSONE (STERAPRED UNI-PAK 21 TAB) 5 MG (21) TBPK tablet Take 6 pills first day; 5 pills second day; 4 pills third day; 3 pills fourth day; 2 pills next day and 1 pill last day. 21 tablet 0   No current facility-administered medications for this visit.      Physical Exam  Blood pressure (!) 149/95, pulse 78, temperature 98.1 F (36.7 C), height 5\' 9"  (1.753 m), weight 177 lb (80.3 kg).  Constitutional: overall normal hygiene, normal nutrition, well developed, normal grooming, normal body habitus. Assistive device:none  Musculoskeletal: gait and station Limp none, muscle tone and strength are normal, no tremors or atrophy is present.  .  Neurological: coordination overall normal.  Deep tendon reflex/nerve stretch intact.  Sensation normal.  Cranial nerves II-XII intact.   Skin:   Normal overall no scars, lesions, ulcers or rashes. No psoriasis.  Psychiatric: Alert and oriented x 3.  Recent memory intact, remote memory unclear.  Normal mood and affect. Well groomed.  Good eye contact.  Cardiovascular: overall no swelling, no varicosities, no edema bilaterally, normal temperatures of the legs and arms, no clubbing, cyanosis and good capillary refill.  Lymphatic: palpation is normal.  The right hip and left hip have full motion. He is tender over the right hip trochanteric area but has no redness or swelling.  Gait is normal.   The patient has been educated about the nature of the problem(s) and counseled on treatment options.  The patient appeared to understand what I have discussed and is in agreement with it.  Encounter Diagnoses  Name Primary?  . Trochanteric bursitis, right hip   . Right hip pain Yes  . Right shoulder pain   . Essential hypertension, benign     PLAN Call if any problems.  Precautions discussed.  Continue current medications.  I will add prednisone dose pack.  Stop the Naprosyn while on it.  Return to clinic 6 weeks   Electronically Signed Darreld McleanWayne  Matthias Bogus, MD 9/27/201710:38 AM

## 2016-07-13 NOTE — Progress Notes (Signed)
Cardiology Office Note  Date: 07/14/2016   ID: Peter Campbell, DOB 11/17/1955, MRN [id number]  PCP: [facility]  Primary Cardiologist: Nona DellSamuel Crescencio Jozwiak, MD   Chief Complaint  Patient presents with  . Coronary Artery Disease    History of Present Illness: Peter Campbell is a 60 y.o. male last seen in April. He presents for a routine follow-up visit. Continues to do well with no significant angina symptoms or worsening shortness of breath with his usual activities. He remains active playing music, local country and bluegrass.  Stress testing from 2015 is outlined below. We continue to follow him with a history of coronary artery calcifications by prior chest CT imaging. He remains on aspirin, Norvasc, and Toprol-XL. He has nitroglycerin available but has not needed it. Not on statin therapy with last LDL 26.  I reviewed his ECG today which shows normal sinus rhythm.  Past Medical History:  Diagnosis Date  . Asthma   . Chronic back pain   . Collagen vascular disease (HCC)   . Coronary atherosclerosis of native coronary artery    Dense coronary atherosclerosis by chest CT November 2014   . Essential hypertension, benign   . Essential tremor   . History of chicken pox   . History of [place] measles   . Sciatica     Current Outpatient Prescriptions  Medication Sig Dispense Refill  . amLODipine (NORVASC) 5 MG tablet TAKE 1 TABLET BY MOUTH ONCE DAILY. 90 tablet 6  . aspirin EC 81 MG tablet Take 1 tablet (81 mg total) by mouth daily. 90 tablet 3  . gabapentin (NEURONTIN) 400 MG capsule Take 400 mg by mouth 3 (three) times daily.    Marland Kitchen. HYDROcodone-acetaminophen (NORCO/VICODIN) 5-325 MG tablet Take 1 tablet by mouth every 6 (six) hours as needed for moderate pain (Must last 30 days.Do not take and drive a car or use machinery.). 110 tablet 0  . ipratropium-albuterol (DUONEB) 0.5-2.5 (3) MG/3ML SOLN Take 3 mLs by nebulization every 6 (six) hours as needed. 360 mL  0  . metoprolol succinate (TOPROL-XL) 25 MG 24 hr tablet TAKE 1 TABLET BY MOUTH TWICE DAILY 60 tablet 6  . naproxen (NAPROSYN) 500 MG tablet TAKE (1) TABLET BY MOUTH TWICE A DAY WITH MEALS (BREAKFAST AND SUPPER) 60 tablet 5  . nitroGLYCERIN (NITROSTAT) 0.4 MG SL tablet Place 1 tablet (0.4 mg total) under the tongue every 5 (five) minutesas needed for chest pain. 25 tablet 3  . temazepam (RESTORIL) 15 MG capsule Take 15 mg by mouth at bedtime as needed for sleep.    . temazepam (RESTORIL) 30 MG capsule Take 30 mg by mouth at bedtime as needed for sleep.     No current facility-administered medications for this visit.    Allergies:  Morphine and related   Social History: The patient  reports that he has been smoking Cigarettes.  He started smoking about 46 years ago. He has a 21.00 pack-year smoking history. He has never used smokeless tobacco. He reports that he does not drink alcohol or use drugs.   ROS:  Please see the history of present illness. Otherwise, complete review of systems is positive for none.  All other systems are reviewed and negative.   Physical Exam: VS:  BP 130/70   Pulse 85   Ht 5\' 9"  (1.753 m)   Wt 181 lb (82.1 kg)   SpO2 97%   BMI 26.73 kg/m , BMI Body mass index is 26.73  kg/m.  Wt Readings from Last 3 Encounters:  07/14/16 181 lb (82.1 kg)  06/24/16 177 lb (80.3 kg)  04/15/16 179 lb 3.2 oz (81.3 kg)    The patient appears comfortable at rest.  HEENT: Conjunctiva and lids normal, oropharynx clear with moist mucosa.  Neck: Supple, no elevated JVP or carotid bruits, no thyromegaly.  Lungs: Clear to auscultation, nonlabored breathing at rest.  Cardiac: Regular rate and rhythm, no S3, soft systolic murmur, no pericardial rub.  Abdomen: Soft, nontender, bowel sounds present, no guarding or rebound.  Extremities: No pitting edema, distal pulses 1-2+.  ECG: I personally reviewed the tracing from 06/13/2014 which showed normal sinus rhythm with lead artifact  and probable early repolarization.  Recent Labwork:    Component Value Date/Time   CHOL 100 12/03/2014 0735   TRIG 279 (H) 12/03/2014 0735   HDL 18 (L) 12/03/2014 0735   CHOLHDL 5.6 12/03/2014 0735   VLDL 56 (H) 12/03/2014 0735   LDLCALC 26 12/03/2014 0735    Other Studies Reviewed Today:  Lexiscan Cardiolite 10/03/2013: FINDINGS: The patient was stressed according to Lexiscan protocol for 3 min and 14 seconds. The resting heart rate of 65 beats per min rose to a maximal heart rate of 99 beats per min. This value represents 60% of the maximal, age predicted heart rate. The resting blood pressure of 122/62 mm of mercury rose to a maximum blood pressure of 127/70 mm of mercury. The stress test was stopped due to protocol completion.  The resting ECG shows normal sinus rhythm at a rate of 76 beats per min. With stress there were no diagnostic ST T-wave abnormalities nor any arrhythmias noted.  Analysis of the raw data showed inferior wall soft tissue attenuation. Analysis of the nuclear images revealed fixed defects in the inferoapical, mid inferior, and basal inferior walls. They were moderate in size and mild in severity. However, there was normal wall motion in all wall segments. This would indicate that these defects are due to soft tissue attenuation artifact. Left ventricular systolic function was normal with a calculated LVEF of 63%.  IMPRESSION: 1. Normal Lexiscan Cardiolite stress test.  2. No evidence of ischemia or scar.  3. Soft tissue attenuation artifact noted.  4. Normal left ventricular systolic function, calculated LVEF 63%.  Assessment and Plan:  1. Coronary atherosclerosis documented by chest CT imaging in November 2014. He reports no angina symptoms and had a low risk Cardiolite study in 2015. ECG today is normal. Continue observation.  2. Long-standing tobacco abuse. He has not been able to quit.  3. Essential hypertension, blood pressure  is reasonably well controlled today on Norvasc and Toprol-XL.  Current medicines were reviewed with the patient today.   Orders Placed This Encounter  Procedures  . EKG 12-Lead    Disposition: Follow-up with me in 6 months.  Signed, Jonelle Sidle, MD, [facility] 07/14/2016 9:38 AM    [facility] at [facility] [address] [place], [place] [address] Phone: [phone]; Fax: [phone]

## 2016-07-14 ENCOUNTER — Ambulatory Visit (INDEPENDENT_AMBULATORY_CARE_PROVIDER_SITE_OTHER): Payer: Medicare Other | Admitting: Cardiology

## 2016-07-14 ENCOUNTER — Encounter: Payer: Self-pay | Admitting: Cardiology

## 2016-07-14 VITALS — BP 130/70 | HR 85 | Ht 69.0 in | Wt 181.0 lb

## 2016-07-14 DIAGNOSIS — I2510 Atherosclerotic heart disease of native coronary artery without angina pectoris: Secondary | ICD-10-CM | POA: Diagnosis not present

## 2016-07-14 DIAGNOSIS — Z720 Tobacco use: Secondary | ICD-10-CM | POA: Diagnosis not present

## 2016-07-14 DIAGNOSIS — I10 Essential (primary) hypertension: Secondary | ICD-10-CM | POA: Diagnosis not present

## 2016-07-14 NOTE — Patient Instructions (Signed)
Your physician wants you to follow-up in: 6 months Dr McDowell You will receive a reminder letter in the mail two months in advance. If you don't receive a letter, please call our office to schedule the follow-up appointment.    Your physician recommends that you continue on your current medications as directed. Please refer to the Current Medication list given to you today.     Thank you for choosing [facility] !        

## 2016-07-20 ENCOUNTER — Telehealth: Payer: Self-pay | Admitting: Orthopedic Surgery

## 2016-07-20 NOTE — Telephone Encounter (Signed)
Patient requests a refill on Hydrocodone/Acetaminophen 5-325 mgs.    Qty  110 ° ° °Sig: Take 1 tablet by mouth every 6 (six) hours as needed for moderate pain (Must last 30 days.  Do not take and drive a car or use machinery.). °

## 2016-07-21 MED ORDER — HYDROCODONE-ACETAMINOPHEN 5-325 MG PO TABS: 1.0000 | ORAL_TABLET | Freq: Four times a day (QID) | ORAL | 0 refills | Status: DC | PRN

## 2016-08-05 ENCOUNTER — Ambulatory Visit (INDEPENDENT_AMBULATORY_CARE_PROVIDER_SITE_OTHER): Payer: Medicare Other | Admitting: Orthopaedic Surgery

## 2016-08-05 VITALS — BP 130/83 | HR 65 | Temp 97.7°F | Ht 69.0 in | Wt 179.0 lb

## 2016-08-05 DIAGNOSIS — M25511 Pain in right shoulder: Secondary | ICD-10-CM | POA: Diagnosis not present

## 2016-08-05 DIAGNOSIS — G8929 Other chronic pain: Secondary | ICD-10-CM

## 2016-08-05 NOTE — Progress Notes (Signed)
CC:  My right shoulder is hurting more  He has recurrent pain of the right shoulder.  He has no new injury or swelling.  He has no numbness  NV intact. ROM is full but tender in the extremes of the right shoulder.    Encounter Diagnosis  Name Primary?  . Chronic right shoulder pain Yes    PROCEDURE NOTE:  The patient request injection, verbal consent was obtained.  The right shoulder was prepped appropriately after time out was performed.   Sterile technique was observed and injection of 1 cc of Depo-Medrol 40 mg with several cc's of plain xylocaine. Anesthesia was provided by ethyl chloride and a 20-gauge needle was used to inject the shoulder area. A posterior approach was used.  The injection was tolerated well.  A band aid dressing was applied.  The patient was advised to apply ice later today and tomorrow to the injection sight as needed.  Return in two months.  Call if any problem.  Electronically Signed Darreld McleanWayne Aprile Dickenson, MD 11/8/201710:32 AM

## 2016-08-24 ENCOUNTER — Telehealth: Payer: Self-pay | Admitting: Orthopaedic Surgery

## 2016-08-24 NOTE — Telephone Encounter (Signed)
Patient called and requested a refill on Hydrocodone/Acetaminophen  5-325  Mgs.   Qty  100   Sig: Take 1 tablet by mouth every 6 (six) hours as needed for moderate pain (Must last 30 days.Do not take and drive a car or use machinery.).

## 2016-08-25 MED ORDER — HYDROCODONE-ACETAMINOPHEN 5-325 MG PO TABS: 1.0000 | ORAL_TABLET | Freq: Four times a day (QID) | ORAL | 0 refills | Status: DC | PRN

## 2016-08-26 ENCOUNTER — Telehealth: Payer: Self-pay

## 2016-08-26 NOTE — Telephone Encounter (Signed)
Shelia from [facility] called to follow up on a referral she sent in June. She said the patient was there last week and said he never heard from us. I told her that DS mailed him a triage letter on 03/04/2016 and verified his address. She is going to call patient and have him call DS this afternoon.

## 2016-08-27 NOTE — Telephone Encounter (Signed)
I called pt and he said he never received the letter. I verified his address. He wants me to resend the letter and he will check with his insurance ( he is not changing insurance first of the year).   He has just been diagnosed with pneumonia and will not be able to have have colonoscopy until after the first of the year.  He has had previous colonoscop[place] in [place] and had some polyps.

## 2016-09-15 ENCOUNTER — Telehealth: Payer: Self-pay | Admitting: Orthopaedic Surgery

## 2016-09-15 MED ORDER — HYDROCODONE-ACETAMINOPHEN 5-325 MG PO TABS: 1.0000 | ORAL_TABLET | Freq: Four times a day (QID) | ORAL | 0 refills | Status: DC | PRN

## 2016-09-15 NOTE — Telephone Encounter (Signed)
Hydrocodone-Acetaminophen  5/325mg  Qty 100 Tablets °

## 2016-09-18 ENCOUNTER — Other Ambulatory Visit (HOSPITAL_COMMUNITY): Payer: Self-pay | Admitting: Internal Medicine

## 2016-09-18 DIAGNOSIS — R0602 Shortness of breath: Secondary | ICD-10-CM

## 2016-09-18 DIAGNOSIS — F17200 Nicotine dependence, unspecified, uncomplicated: Secondary | ICD-10-CM

## 2016-09-24 ENCOUNTER — Ambulatory Visit (HOSPITAL_COMMUNITY)
Admission: RE | Admit: 2016-09-24 | Discharge: 2016-09-24 | Disposition: A | Discharge status: Home / Self Care | Payer: Medicare Other | Source: Ambulatory Visit | Attending: Internal Medicine | Admitting: Internal Medicine

## 2016-09-24 ENCOUNTER — Encounter (HOSPITAL_COMMUNITY): Payer: Self-pay

## 2016-09-24 DIAGNOSIS — R0602 Shortness of breath: Secondary | ICD-10-CM | POA: Diagnosis present

## 2016-09-24 DIAGNOSIS — F17200 Nicotine dependence, unspecified, uncomplicated: Secondary | ICD-10-CM

## 2016-09-24 DIAGNOSIS — I2510 Atherosclerotic heart disease of native coronary artery without angina pectoris: Secondary | ICD-10-CM | POA: Insufficient documentation

## 2016-09-24 MED ORDER — IOPAMIDOL (ISOVUE-300) INJECTION 61%
75.0000 mL | Freq: Once | INTRAVENOUS | Status: AC | PRN
  Administered 2016-09-24: 75 mL via INTRAVENOUS

## 2016-10-07 ENCOUNTER — Ambulatory Visit: Payer: Medicare Other | Admitting: Orthopaedic Surgery

## 2016-10-13 ENCOUNTER — Encounter: Payer: Self-pay | Admitting: Orthopaedic Surgery

## 2016-10-13 ENCOUNTER — Ambulatory Visit (INDEPENDENT_AMBULATORY_CARE_PROVIDER_SITE_OTHER): Payer: Medicare Other | Admitting: Orthopaedic Surgery

## 2016-10-13 VITALS — BP 137/88 | HR 70 | Temp 97.9°F | Ht 69.0 in | Wt 182.0 lb

## 2016-10-13 DIAGNOSIS — M25511 Pain in right shoulder: Secondary | ICD-10-CM | POA: Diagnosis not present

## 2016-10-13 DIAGNOSIS — G8929 Other chronic pain: Secondary | ICD-10-CM | POA: Diagnosis not present

## 2016-10-13 NOTE — Progress Notes (Signed)
Patient GN:Peter Campbell, male DOB:07-Jun-1956, 61 y.o. [id number]  Chief Complaint  Patient presents with  . Follow-up    Right Shoulder    HPI  Peter Campbell is a 61 y.o. male who has chronic right shoulder pain.  He has been doing his exercises and taking his medicine.  He is stable. He has no paresthesias.  Cold weather makes the pain worse. HPI  Body mass index is 26.88 kg/m.  ROS  Review of Systems  Past Medical History:  Diagnosis Date  . Asthma   . Chronic back pain   . Collagen vascular disease (HCC)   . Coronary atherosclerosis of native coronary artery    Dense coronary atherosclerosis by chest CT November 2014   . Essential hypertension, benign   . Essential tremor   . History of chicken pox   . History of [place] measles   . Sciatica     Past Surgical History:  Procedure Laterality Date  . ABDOMINAL SURGERY    . CIRCUMCISION    . COLON SURGERY    . SHOULDER ACROMIOPLASTY Right 06/12/2014   Procedure: RIGHT SHOULDER ACROMIOPLASTY;  Surgeon: Darreld Mclean, MD;  Location: [facility];  Service: Orthopedics;  Laterality: Right;  . SHOULDER OPEN ROTATOR CUFF REPAIR Right 06/12/2014   Procedure: OPEN REPAIR ROTATOR CUFF RIGHT ;  Surgeon: Darreld Mclean, MD;  Location: [facility];  Service: Orthopedics;  Laterality: Right;  . TONSILLECTOMY      Family History  Problem Relation Age of Onset  . COPD Father   . Cancer Father   . Cancer Mother   . Heart attack Brother   . Cancer Sister     Social History Social History  Substance Use Topics  . Smoking status: Current Every Day Smoker    Packs/day: 1.00    Years: 21.00    Types: Cigarettes    Start date: 08/28/1969  . Smokeless tobacco: Never Used  . Alcohol use No    Allergies  Allergen Reactions  . Morphine And Related Itching    Current Outpatient Prescriptions  Medication Sig Dispense Refill  . amLODipine (NORVASC) 5 MG tablet TAKE 1 TABLET BY MOUTH ONCE DAILY. 90 tablet 6  . aspirin EC 81 MG tablet  Take 1 tablet (81 mg total) by mouth daily. 90 tablet 3  . gabapentin (NEURONTIN) 400 MG capsule Take 400 mg by mouth 3 (three) times daily.    Marland Kitchen HYDROcodone-acetaminophen (NORCO/VICODIN) 5-325 MG tablet Take 1 tablet by mouth every 6 (six) hours as needed for moderate pain (Must last 30 days.Do not take and drive a car or use machinery.). 100 tablet 0  . ipratropium-albuterol (DUONEB) 0.5-2.5 (3) MG/3ML SOLN Take 3 mLs by nebulization every 6 (six) hours as needed. 360 mL 0  . metoprolol succinate (TOPROL-XL) 25 MG 24 hr tablet TAKE 1 TABLET BY MOUTH TWICE DAILY 60 tablet 6  . naproxen (NAPROSYN) 500 MG tablet TAKE (1) TABLET BY MOUTH TWICE A DAY WITH MEALS (BREAKFAST AND SUPPER) 60 tablet 5  . naproxen (NAPROSYN) 500 MG tablet Take 1 tablet (500 mg total) by mouth 2 (two) times daily with a meal. 60 tablet 5  . nitroGLYCERIN (NITROSTAT) 0.4 MG SL tablet Place 1 tablet (0.4 mg total) under the tongue every 5 (five) minutesas needed for chest pain. 25 tablet 3  . oxyCODONE-acetaminophen (PERCOCET/ROXICET) 5-325 MG tablet Take 1 tablet by mouth every 6 (six) hours as needed for moderate pain or severe pain (Must last 30 days.Do not drive  or operate machinery while taking this medicine). 90 tablet 0  . temazepam (RESTORIL) 15 MG capsule Take 15 mg by mouth at bedtime as needed for sleep.    . temazepam (RESTORIL) 30 MG capsule Take 30 mg by mouth at bedtime as needed for sleep.     No current facility-administered medications for this visit.      Physical Exam  Blood pressure 137/88, pulse 70, temperature 97.9 F (36.6 C), height 5\' 9"  (1.753 m), weight 182 lb (82.6 kg).  Constitutional: overall normal hygiene, normal nutrition, well developed, normal grooming, normal body habitus. Assistive device:none  Musculoskeletal: gait and station Limp none, muscle tone and strength are normal, no tremors or atrophy is present.  .  Neurological: coordination overall normal.  Deep tendon  reflex/nerve stretch intact.  Sensation normal.  Cranial nerves II-XII intact.   Skin:   Normal overall no scars, lesions, ulcers or rashes. No psoriasis.  Psychiatric: Alert and oriented x 3.  Recent memory intact, remote memory unclear.  Normal mood and affect. Well groomed.  Good eye contact.  Cardiovascular: overall no swelling, no varicosities, no edema bilaterally, normal temperatures of the legs and arms, no clubbing, cyanosis and good capillary refill.  Lymphatic: palpation is normal.  Examination of right Upper Extremity is done.  Inspection:   Overall:  Elbow non-tender without crepitus or defects, forearm non-tender without crepitus or defects, wrist non-tender without crepitus or defects, hand non-tender.    Shoulder: with glenohumeral joint tenderness, without effusion.   Upper arm: without swelling and tenderness   Range of motion:   Overall:  Full range of motion of the elbow, full range of motion of wrist and full range of motion in fingers.   Shoulder:  right  170 degrees forward flexion; 165 degrees abduction; 35 degrees internal rotation, 35 degrees external rotation, 20 degrees extension, 40 degrees adduction.   Stability:   Overall:  Shoulder, elbow and wrist stable   Strength and Tone:   Overall full shoulder muscles strength, full upper arm strength and normal upper arm bulk and tone.   The patient has been educated about the nature of the problem(s) and counseled on treatment options.  The patient appeared to understand what I have discussed and is in agreement with it.  Encounter Diagnosis  Name Primary?  . Chronic right shoulder pain Yes    PLAN Call if any problems.  Precautions discussed.  Continue current medications.   Return to clinic 3 months   Electronically Signed Darreld McleanWayne Lenay Lovejoy, MD 1/16/20182:16 PM

## 2016-10-23 ENCOUNTER — Telehealth: Payer: Self-pay | Admitting: Orthopaedic Surgery

## 2016-10-23 NOTE — Telephone Encounter (Signed)
Patient requests refill on Hydrocodone/Acetaminophen (Norco)  5-325  Mgs.   Qty  100 ° ° °Sig: Take 1 tablet by mouth every 6 (six) hours as needed for moderate pain (Must last 30 days.  Do not take and drive a car or use machinery.). °

## 2016-10-26 MED ORDER — HYDROCODONE-ACETAMINOPHEN 5-325 MG PO TABS: 1.0000 | ORAL_TABLET | Freq: Four times a day (QID) | ORAL | 0 refills | Status: DC | PRN

## 2016-11-20 ENCOUNTER — Telehealth: Payer: Self-pay | Admitting: Orthopaedic Surgery

## 2016-12-15 ENCOUNTER — Telehealth: Payer: Self-pay

## 2016-12-15 NOTE — Telephone Encounter (Signed)
Patient received letter to schedule tcs  [phone]

## 2016-12-16 NOTE — Telephone Encounter (Signed)
I called pt and he has chronic constipation. I have scheduled him an OV with Wynne DustEric Gill, NP on 01/11/2017 at 11:30 AM. I am faxing PCP for updated referral since his last referral was June 2017.

## 2016-12-17 ENCOUNTER — Encounter: Payer: Self-pay | Admitting: Orthopaedic Surgery

## 2016-12-17 ENCOUNTER — Ambulatory Visit (INDEPENDENT_AMBULATORY_CARE_PROVIDER_SITE_OTHER): Payer: Medicare Other | Admitting: Orthopaedic Surgery

## 2016-12-17 VITALS — BP 128/83 | HR 63 | Temp 97.3°F | Ht 69.0 in | Wt 180.0 lb

## 2016-12-17 DIAGNOSIS — M25511 Pain in right shoulder: Secondary | ICD-10-CM

## 2016-12-17 DIAGNOSIS — F17210 Nicotine dependence, cigarettes, uncomplicated: Secondary | ICD-10-CM | POA: Diagnosis not present

## 2016-12-17 DIAGNOSIS — G8929 Other chronic pain: Secondary | ICD-10-CM

## 2016-12-17 MED ORDER — HYDROCODONE-ACETAMINOPHEN 5-325 MG PO TABS: 1.0000 | ORAL_TABLET | Freq: Four times a day (QID) | ORAL | 0 refills | Status: DC | PRN

## 2016-12-17 NOTE — Progress Notes (Signed)
PROCEDURE NOTE:  The patient request injection, verbal consent was obtained.  The right shoulder was prepped appropriately after time out was performed.   Sterile technique was observed and injection of 1 cc of Depo-Medrol 40 mg with several cc's of plain xylocaine. Anesthesia was provided by ethyl chloride and a 20-gauge needle was used to inject the shoulder area. A posterior approach was used.  The injection was tolerated well.  A band aid dressing was applied.  The patient was advised to apply ice later today and tomorrow to the injection sight as needed.  He still smokes and is trying to quit.  I have talked to him about this.  Encounter Diagnoses  Name Primary?  . Chronic right shoulder pain Yes  . Cigarette nicotine dependence without complication     Return in three months.  Call if any problem.  I have reviewed the [place] Controlled Substance Reporting System web site prior to prescribing narcotic medicine for this patient.  Electronically Signed Darreld McleanWayne Tanush Drees, MD 3/22/201810:02 AM

## 2016-12-17 NOTE — Patient Instructions (Signed)
Steps to Quit Smoking Smoking tobacco can be bad for your health. It can also affect almost every organ in your body. Smoking puts you and people around you at risk for many serious long-lasting (chronic) diseases. Quitting smoking is hard, but it is one of the best things that you can do for your health. It is never too late to quit. What are the benefits of quitting smoking? When you quit smoking, you lower your risk for getting serious diseases and conditions. They can include:  Lung cancer or lung disease.  Heart disease.  Stroke.  Heart attack.  Not being able to have children (infertility).  Weak bones (osteoporosis) and broken bones (fractures). If you have coughing, wheezing, and shortness of breath, those symptoms may get better when you quit. You may also get sick less often. If you are pregnant, quitting smoking can help to lower your chances of having a baby of low birth weight. What can I do to help me quit smoking? Talk with your doctor about what can help you quit smoking. Some things you can do (strategies) include:  Quitting smoking totally, instead of slowly cutting back how much you smoke over a period of time.  Going to in-person counseling. You are more likely to quit if you go to many counseling sessions.  Using resources and support systems, such as:  Online chats with a counselor.  Phone quitlines.  Printed self-help materials.  Support groups or group counseling.  Text messaging programs.  Mobile phone apps or applications.  Taking medicines. Some of these medicines may have nicotine in them. If you are pregnant or breastfeeding, do not take any medicines to quit smoking unless your doctor says it is okay. Talk with your doctor about counseling or other things that can help you. Talk with your doctor about using more than one strategy at the same time, such as taking medicines while you are also going to in-person counseling. This can help make quitting  easier. What things can I do to make it easier to quit? Quitting smoking might feel very hard at first, but there is a lot that you can do to make it easier. Take these steps:  Talk to your family and friends. Ask them to support and encourage you.  Call phone quitlines, reach out to support groups, or work with a counselor.  Ask people who smoke to not smoke around you.  Avoid places that make you want (trigger) to smoke, such as:  Bars.  Parties.  Smoke-break areas at work.  Spend time with people who do not smoke.  Lower the stress in your life. Stress can make you want to smoke. Try these things to help your stress:  Getting regular exercise.  Deep-breathing exercises.  Yoga.  Meditating.  Doing a body scan. To do this, close your eyes, focus on one area of your body at a time from head to toe, and notice which parts of your body are tense. Try to relax the muscles in those areas.  Download or buy apps on your mobile phone or tablet that can help you stick to your quit plan. There are many free apps, such as QuitGuide from the CDC (Centers for Disease Control and Prevention). You can find more support from smokefree.gov and other websites. This information is not intended to replace advice given to you by your health care provider. Make sure you discuss any questions you have with your health care provider. Document Released: 07/11/2009 Document Revised: 05/12/2016 Document   Reviewed: 01/29/2015 Elsevier Interactive Patient Education  2017 Elsevier Inc.  

## 2016-12-29 ENCOUNTER — Other Ambulatory Visit: Payer: Self-pay | Admitting: Cardiovascular Disease

## 2017-01-11 ENCOUNTER — Ambulatory Visit (INDEPENDENT_AMBULATORY_CARE_PROVIDER_SITE_OTHER): Payer: Medicare Other | Admitting: Nurse Practitioner

## 2017-01-11 ENCOUNTER — Encounter: Payer: Self-pay | Admitting: Nurse Practitioner

## 2017-01-11 DIAGNOSIS — Z860100 Personal history of colon polyps, unspecified: Secondary | ICD-10-CM | POA: Insufficient documentation

## 2017-01-11 DIAGNOSIS — K5900 Constipation, unspecified: Secondary | ICD-10-CM | POA: Insufficient documentation

## 2017-01-11 NOTE — Assessment & Plan Note (Signed)
Per the patient his last colonoscopy had polyps removed. States he was recommended many years ago to have a colonoscopy every 2 years. Not sure the indication for this, such as Lynch syndrome, however we will request records from [place] and from [place], [place] to try to determine his history and recommend plan moving forward. Return for follow-up in 2 months.

## 2017-01-11 NOTE — Progress Notes (Signed)
CC'D TO PCP °

## 2017-01-11 NOTE — Progress Notes (Signed)
Cardiology Office Note  Date: 01/12/2017   ID: KHYRAN RIERA, DOB 10/30/1955, MRN [id number]  PCP: Alvina Filbert, MD  Primary Cardiologist: Nona Dell, MD   Chief Complaint  Patient presents with  . Coronary Artery Disease    History of Present Illness: Peter Campbell is a 61 y.o. male last seen in October 2017. He presents for a routine follow-up visit. Since last encounter he has not had any problems with exertional chest pain or unusual shortness of breath. Continues to enjoy playing music with friends and on specific occasions.  I reviewed his medications. He is on aspirin, Norvasc, Toprol-XL, and has nitroglycerin available which he has not needed.  He continues to smoke cigarettes, has not been able to quit. We have discussed smoking cessation.  I reviewed his most recent lab work which is outlined below. He had an ischemic evaluation back in 2015, low risk.  Past Medical History:  Diagnosis Date  . Asthma   . Chronic back pain   . Collagen vascular disease (HCC)   . Colonic mass    History, s/p removal; per patient.  . Coronary atherosclerosis of native coronary artery    Dense coronary atherosclerosis by chest CT November 2014   . Essential hypertension, benign   . Essential tremor   . History of chicken pox   . History of [place] measles   . Sciatica     Past Surgical History:  Procedure Laterality Date  . ABDOMINAL SURGERY    . CIRCUMCISION    . COLON SURGERY     for mass removal  . SHOULDER ACROMIOPLASTY Right 06/12/2014   Procedure: RIGHT SHOULDER ACROMIOPLASTY;  Surgeon: Darreld Mclean, MD;  Location: [facility];  Service: Orthopedics;  Laterality: Right;  . SHOULDER OPEN ROTATOR CUFF REPAIR Right 06/12/2014   Procedure: OPEN REPAIR ROTATOR CUFF RIGHT ;  Surgeon: Darreld Mclean, MD;  Location: [facility];  Service: Orthopedics;  Laterality: Right;  . TONSILLECTOMY      Current Outpatient Prescriptions  Medication Sig Dispense Refill  . amLODipine (NORVASC) 5  MG tablet TAKE 1 TABLET BY MOUTH ONCE DAILY. 90 tablet 6  . aspirin EC 81 MG tablet Take 1 tablet (81 mg total) by mouth daily. 90 tablet 3  . gabapentin (NEURONTIN) 400 MG capsule Take 400 mg by mouth 3 (three) times daily.    Marland Kitchen HYDROcodone-acetaminophen (NORCO/VICODIN) 5-325 MG tablet Take 1 tablet by mouth every 6 (six) hours as needed for moderate pain (Must last 30 days.Do not take and drive a car or use machinery.). 100 tablet 0  . ipratropium-albuterol (DUONEB) 0.5-2.5 (3) MG/3ML SOLN Take 3 mLs by nebulization every 6 (six) hours as needed. 360 mL 0  . linaclotide (LINZESS) 72 MCG capsule Take 72 mcg by mouth daily before breakfast.    . metoprolol succinate (TOPROL-XL) 25 MG 24 hr tablet TAKE 1 TABLET BY MOUTH TWICE DAILY 60 tablet 0  . naproxen (NAPROSYN) 500 MG tablet TAKE (1) TABLET BY MOUTH TWICE A DAY WITH MEALS (BREAKFAST AND SUPPER) 60 tablet 5  . nitroGLYCERIN (NITROSTAT) 0.4 MG SL tablet Place 1 tablet (0.4 mg total) under the tongue every 5 (five) minutesas needed for chest pain. 25 tablet 3  . temazepam (RESTORIL) 15 MG capsule Take 15 mg by mouth at bedtime as needed for sleep.    . temazepam (RESTORIL) 30 MG capsule Take 30 mg by mouth at bedtime as needed for sleep.     No current facility-administered medications for this  visit.    Allergies:  Patient has no known allergies.   Social History: The patient  reports that he has been smoking Cigarettes.  He started smoking about 47 years ago. He has a 21.00 pack-year smoking history. He has never used smokeless tobacco. He reports that he does not drink alcohol or use drugs.  ROS:  Please see the history of present illness. Otherwise, complete review of systems is positive for none.  All other systems are reviewed and negative.   Physical Exam: VS:  BP 120/70   Pulse 60   Ht  (1.727 m)   Wt 175 lb (79.4 kg)   SpO2 93%   BMI 26.61 kg/m , BMI Body mass index is 26.61 kg/m.  Wt Readings from Last 3 Encounters:    01/12/17 175 lb (79.4 kg)  01/11/17 173 lb 5.8 oz (78.6 kg)  12/17/16 180 lb (81.6 kg)    The patient appears comfortable at rest.  HEENT: Conjunctiva and lids normal, oropharynx clear with moist mucosa.  Neck: Supple, no elevated JVP or carotid bruits, no thyromegaly.  Lungs: Clear to auscultation, nonlabored breathing at rest.  Cardiac: Regular rate and rhythm, no S3, soft systolic murmur, no pericardial rub.  Abdomen: Soft, nontender, bowel sounds present, no guarding or rebound.  Extremities: No pitting edema, distal pulses 1-2+. Skin: Warm and dry. Musculoskeletal: No kyphosis. Neuropsychiatric: Alert and oriented 3, affect appropriate.  ECG: I personally reviewed the tracing from 07/14/2016 which showed sinus rhythm.  Recent Labwork:    Component Value Date/Time   CHOL 100 12/03/2014 0735   TRIG 279 (H) 12/03/2014 0735   HDL 18 (L) 12/03/2014 0735   CHOLHDL 5.6 12/03/2014 0735   VLDL 56 (H) 12/03/2014 0735   LDLCALC 26 12/03/2014 0735    Other Studies Reviewed Today:  Lexiscan Cardiolite 10/03/2013: FINDINGS: The patient was stressed according to Lexiscan protocol for 3 min and 14 seconds. The resting heart rate of 65 beats per min rose to a maximal heart rate of 99 beats per min. This value represents 60% of the maximal, age predicted heart rate. The resting blood pressure of 122/62 mm of mercury rose to a maximum blood pressure of 127/70 mm of mercury. The stress test was stopped due to protocol completion.  The resting ECG shows normal sinus rhythm at a rate of 76 beats per min. With stress there were no diagnostic ST T-wave abnormalities nor any arrhythmias noted.  Analysis of the raw data showed inferior wall soft tissue attenuation. Analysis of the nuclear images revealed fixed defects in the inferoapical, mid inferior, and basal inferior walls. They were moderate in size and mild in severity. However, there was normal wall motion in all wall  segments. This would indicate that these defects are due to soft tissue attenuation artifact. Left ventricular systolic function was normal with a calculated LVEF of 63%.  IMPRESSION: 1. Normal Lexiscan Cardiolite stress test.  2. No evidence of ischemia or scar.  3. Soft tissue attenuation artifact noted.  4. Normal left ventricular systolic function, calculated LVEF 63%.  Assessment and Plan:  1. Coronary atherosclerosis based on prior chest CT imaging. He had a negative Cardiolite study in 2015. No active angina symptoms on medical therapy including aspirin, Norvasc, and beta blocker. Last LDL was 26. Continue observation for now.  2. Ongoing tobacco abuse. Smoking cessation discussed.  3. Essential hypertension on a blood pressure is well controlled today. No changes made.  Current medicines were reviewed with the patient  today.  Disposition: Follow-up in 6 months.  Signed, Jonelle Sidle, MD, [facility] 01/12/2017 10:27 AM    [facility] at [facility] [address] [place], [place] [address] Phone: [phone]; Fax: [phone]

## 2017-01-11 NOTE — Progress Notes (Signed)
Primary Care Physician:  Alvina Filbert, MD Primary Gastroenterologist:  Dr. Jena Gauss  Chief Complaint  Patient presents with  . Colonoscopy    HPI:   Peter Campbell is a 61 y.o. male who presents On referral from primary care to complete colonoscopy. He notes reviewed. Apparently last colonoscopy was in [place].  Today he states he's doing ok. 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 Had some abdominal pain right sided and had what sounds to have a lipoma removed. Has long-standing, chronic constipation. Has tried stool softeners (Colace) which didn't help. Probiotics make him nauseated (even Activia.) Recently took miralax for significent colonocopy recently which "cleaned me out good." Stopped working. Took a laxative later in the week which again worked. Hasn't had a bowel movement in 2-3 days now again. When he does have an unassisted BM it is small hard balls. Denies hematochezia, melena. Last colonoscopy was 4 years ago, recommended 2 year follow-up. Admits some RLQ localized abdominal pain. Denies N/V, unintentional weight loss. Denies chest pain, dyspnea, dizziness, lightheadedness, syncope, near syncope. Denies any other upper or lower GI symptoms.  Past Medical History:  Diagnosis Date  . Asthma   . Chronic back pain   . Collagen vascular disease (HCC)   . Coronary atherosclerosis of native coronary artery    Dense coronary atherosclerosis by chest CT November 2014   . Essential hypertension, benign   . Essential tremor   . History of chicken pox   . History of [place] measles   . Sciatica     Past Surgical History:  Procedure Laterality Date  . ABDOMINAL SURGERY    . CIRCUMCISION    . COLON SURGERY    . SHOULDER ACROMIOPLASTY Right 06/12/2014   Procedure: RIGHT SHOULDER ACROMIOPLASTY;  Surgeon: Darreld Mclean, MD;  Location: [facility];  Service: Orthopedics;  Laterality: Right;  . SHOULDER OPEN ROTATOR CUFF REPAIR  Right 06/12/2014   Procedure: OPEN REPAIR ROTATOR CUFF RIGHT ;  Surgeon: Darreld Mclean, MD;  Location: [facility];  Service: Orthopedics;  Laterality: Right;  . TONSILLECTOMY      Current Outpatient Prescriptions  Medication Sig Dispense Refill  . amLODipine (NORVASC) 5 MG tablet TAKE 1 TABLET BY MOUTH ONCE DAILY. 90 tablet 6  . aspirin EC 81 MG tablet Take 1 tablet (81 mg total) by mouth daily. 90 tablet 3  . gabapentin (NEURONTIN) 400 MG capsule Take 400 mg by mouth 3 (three) times daily.    Marland Kitchen HYDROcodone-acetaminophen (NORCO/VICODIN) 5-325 MG tablet Take 1 tablet by mouth every 6 (six) hours as needed for moderate pain (Must last 30 days.Do not take and drive a car or use machinery.). 100 tablet 0  . ipratropium-albuterol (DUONEB) 0.5-2.5 (3) MG/3ML SOLN Take 3 mLs by nebulization every 6 (six) hours as needed. 360 mL 0  . metoprolol succinate (TOPROL-XL) 25 MG 24 hr tablet TAKE 1 TABLET BY MOUTH TWICE DAILY 60 tablet 0  . naproxen (NAPROSYN) 500 MG tablet TAKE (1) TABLET BY MOUTH TWICE A DAY WITH MEALS (BREAKFAST AND SUPPER) 60 tablet 5  . nitroGLYCERIN (NITROSTAT) 0.4 MG SL tablet Place 1 tablet (0.4 mg total) under the tongue every 5 (five) minutesas needed for chest pain. 25 tablet 3  . temazepam (RESTORIL) 15 MG capsule Take 15 mg by mouth at bedtime as needed for sleep.    . temazepam (RESTORIL) 30 MG capsule Take 30 mg by mouth at bedtime as  needed for sleep.    . naproxen (NAPROSYN) 500 MG tablet Take 1 tablet (500 mg total) by mouth 2 (two) times daily with a meal. (Patient not taking: Reported on 01/11/2017) 60 tablet 5   No current facility-administered medications for this visit.     Allergies as of 01/11/2017 - Review Complete 01/11/2017  Allergen Reaction Noted  . Morphine and related Itching 02/15/2013    Family History  Problem Relation Age of Onset  . COPD Father   . Cancer Father   . Cancer Mother   . Heart attack Brother   . Cancer Sister     Social History    Social History  . Marital status: Widowed    Spouse name: N/A  . Number of children: N/A  . Years of education: N/A   Occupational History  . Not on file.   Social History Main Topics  . Smoking status: Current Every Day Smoker    Packs/day: 1.00    Years: 21.00    Types: Cigarettes    Start date: 08/28/1969  . Smokeless tobacco: Never Used  . Alcohol use No  . Drug use: No  . Sexual activity: Yes    Partners: Female   Other Topics Concern  . Not on file   Social History Narrative  . No narrative on file    Review of Systems: General: Negative for anorexia, weight loss, fever, chills, fatigue, weakness. ENT: Negative for hoarseness, difficulty swallowing. CV: Negative for chest pain, angina, palpitations, peripheral edema.  Respiratory: Negative for dyspnea at rest, cough, sputum, wheezing.  GI: See history of present illness. MS: Chronic pain.  Derm: Negative for rash or itching.  Endo: Negative for unusual weight change.  Heme: Negative for bruising or bleeding. Allergy: Negative for rash or hives.    Physical Exam: BP 120/79   Pulse 69   Temp 98 F (36.7 C) (Oral)   Ht  (1.727 m)   Wt 173 lb 5.8 oz (78.6 kg)   BMI 26.36 kg/m  General:   Alert and oriented. Pleasant and cooperative. Well-nourished and well-developed.  Head:  Normocephalic and atraumatic. Eyes:  Without icterus, sclera clear and conjunctiva pink.  Ears:  Normal auditory acuity. Cardiovascular:  S1, S2 present without murmurs appreciated. Extremities without clubbing or edema. Respiratory:  Clear to auscultation bilaterally. No wheezes, rales, or rhonchi. No distress.  Gastrointestinal:  +BS, soft, non-tender and non-distended. No HSM noted. No guarding or rebound. No masses appreciated.  Rectal:  Deferred  Musculoskalatal:  Symmetrical without gross deformities. Neurologic:  Alert and oriented x4;  grossly normal neurologically. Psych:  Alert and cooperative. Normal mood and  affect. Heme/Lymph/Immune: No excessive bruising noted.    01/11/2017 12:14 PM   Disclaimer: This note was dictated with voice recognition software. Similar sounding words can inadvertently be transcribed and may not be corrected upon review.

## 2017-01-11 NOTE — Assessment & Plan Note (Signed)
The patient has had chronic constipation for a number of years. MiraLAX and over-the-counter laxative intermittently effective. Colace and effective. At this point I'll have him try Linzess 72 g once a day. Recommended take an empty stomach. We will request records related to his "tumor" removal and colon resection in [place]. We will also request records from his last colon[place] [facility]. Return for follow-up in 2 months to make further plans. He states he was recommended to have a colonoscopy every 2 years.

## 2017-01-11 NOTE — Patient Instructions (Signed)
1. I am giving the samples of Linzess for constipation. Take one 72 g pill once a day, on an empty stomach. 2. We will request records from both [place] and from the [place], [place]. 3. We will give you samples to last 2 weeks. 4. Call with a progress report in 1-2 weeks and let us know if it is helping. 5. Return for follow-up in 2 months.

## 2017-01-12 ENCOUNTER — Encounter: Payer: Self-pay | Admitting: Cardiology

## 2017-01-12 ENCOUNTER — Ambulatory Visit (INDEPENDENT_AMBULATORY_CARE_PROVIDER_SITE_OTHER): Payer: Medicare Other | Admitting: Cardiology

## 2017-01-12 ENCOUNTER — Ambulatory Visit: Payer: Medicare Other | Admitting: Orthopaedic Surgery

## 2017-01-12 VITALS — BP 120/70 | HR 60 | Ht 68.0 in | Wt 175.0 lb

## 2017-01-12 DIAGNOSIS — Z720 Tobacco use: Secondary | ICD-10-CM | POA: Diagnosis not present

## 2017-01-12 DIAGNOSIS — I2510 Atherosclerotic heart disease of native coronary artery without angina pectoris: Secondary | ICD-10-CM | POA: Diagnosis not present

## 2017-01-12 DIAGNOSIS — I10 Essential (primary) hypertension: Secondary | ICD-10-CM | POA: Diagnosis not present

## 2017-01-12 NOTE — Patient Instructions (Signed)

## 2017-01-18 ENCOUNTER — Telehealth: Payer: Self-pay | Admitting: Orthopaedic Surgery

## 2017-01-18 MED ORDER — HYDROCODONE-ACETAMINOPHEN 5-325 MG PO TABS: 1.0000 | ORAL_TABLET | Freq: Four times a day (QID) | ORAL | 0 refills | Status: DC | PRN

## 2017-01-18 NOTE — Telephone Encounter (Signed)
Hydrocodone-Acetaminophen  5/325mg  Qty 100 Tablets °

## 2017-01-27 ENCOUNTER — Telehealth: Payer: Self-pay | Admitting: Internal Medicine

## 2017-01-27 NOTE — Telephone Encounter (Signed)
Routing to EG 

## 2017-01-27 NOTE — Telephone Encounter (Signed)
Pt has used his 2 weeks of Linzess and said that they were working and needed a prescription called to [facility] in [place]

## 2017-01-28 ENCOUNTER — Telehealth: Payer: Self-pay | Admitting: Internal Medicine

## 2017-01-28 MED ORDER — LINACLOTIDE 72 MCG PO CAPS: 72.0000 ug | ORAL_CAPSULE | Freq: Every day | ORAL | 3 refills | Status: DC

## 2017-01-28 NOTE — Telephone Encounter (Signed)
Pt called again this morning to say that his pharmacy hasn't received a prescription yet on the samples he was taking.

## 2017-01-28 NOTE — Telephone Encounter (Signed)
Pt is aware.  

## 2017-01-28 NOTE — Telephone Encounter (Signed)
Please tell the patient the Rx was sent again to the pharmacy. Last refill accidentally was marked as a sample. I corrected it. Should be at his pharmacy in a few months. Routing to DS and JL with JL out of office

## 2017-01-28 NOTE — Addendum Note (Signed)
Addended by: Delane GingerGILL, Zackariah Vanderpol A on: 01/28/2017 01:14 PM   Modules accepted: Orders

## 2017-01-28 NOTE — Telephone Encounter (Signed)
Routing to EG 

## 2017-02-17 ENCOUNTER — Other Ambulatory Visit: Payer: Self-pay | Admitting: Cardiology

## 2017-02-18 ENCOUNTER — Telehealth: Payer: Self-pay | Admitting: Orthopaedic Surgery

## 2017-02-18 MED ORDER — HYDROCODONE-ACETAMINOPHEN 5-325 MG PO TABS: 1.0000 | ORAL_TABLET | Freq: Four times a day (QID) | ORAL | 0 refills | Status: DC | PRN

## 2017-02-18 NOTE — Telephone Encounter (Signed)
Hydrocodone-Acetaminophen  5/325 mg  Qty 90 Tablets °

## 2017-02-26 ENCOUNTER — Other Ambulatory Visit: Payer: Self-pay | Admitting: Cardiovascular Disease

## 2017-03-17 ENCOUNTER — Other Ambulatory Visit: Payer: Self-pay

## 2017-03-17 ENCOUNTER — Ambulatory Visit (INDEPENDENT_AMBULATORY_CARE_PROVIDER_SITE_OTHER): Payer: Medicare Other | Admitting: Nurse Practitioner

## 2017-03-17 ENCOUNTER — Encounter: Payer: Self-pay | Admitting: Nurse Practitioner

## 2017-03-17 VITALS — BP 91/64 | HR 72 | Temp 98.4°F | Ht 70.0 in | Wt 163.8 lb

## 2017-03-17 DIAGNOSIS — Z86010 Personal history of colonic polyps: Secondary | ICD-10-CM

## 2017-03-17 DIAGNOSIS — I2510 Atherosclerotic heart disease of native coronary artery without angina pectoris: Secondary | ICD-10-CM

## 2017-03-17 DIAGNOSIS — K5900 Constipation, unspecified: Secondary | ICD-10-CM

## 2017-03-17 MED ORDER — PEG 3350-KCL-NA BICARB-NACL 420 G PO SOLR: 4000.0000 mL | ORAL | 0 refills | Status: DC

## 2017-03-17 NOTE — Progress Notes (Signed)
cc'ed to pcp °

## 2017-03-17 NOTE — Assessment & Plan Note (Signed)
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 His last colonoscopy was at least 4 years ago if not longer. It was done in [place]. We attempted to request records but have not received anything back. Records from [place], [place] where he had his colectomy was returned with "no records found" which is not surprising given the fact he was last seen there in the 1980s. At this point we will proceed with colonoscopy, as she was recommended to follow-up for another exam in 2 years and it is been at least 4. Return for follow-up in 3 months, or based on postprocedure recommendations.  Proceed with TCS on propofol/MAC with Dr. Gala Romney in near future: the risks, benefits, and alternatives have been discussed with the patient in detail. The patient states understanding and desires to proceed.  The patient is currently on Neurontin, hydrocodone, Restoril. No other anticoagulants, anxiolytics, chronic pain medications, or antidepressants. Denies alcohol and drug use. We will plan for the procedure on propofol/MAC to promote adequate sedation.

## 2017-03-17 NOTE — Progress Notes (Signed)
Referring Provider: Alvina Filbert, MD Primary Care Physician:  Alvina Filbert, MD Primary GI:  Dr. Jena Gauss  Chief Complaint  Patient presents with  . Constipation    better, Linzess helped  . Colonoscopy    last tcs was 4 yrs ago, was due in 2 yrs    HPI:   Peter Campbell is a 61 y.o. male who presents for follow-up on constipation and history of colon polyps. He was last seen in our office 01/11/2017 on referral from primary care to complete a colonoscopy. He was doing okay at his last visit. He admitted a history of tumor resection from his colon in the 29s with partial colectomy and colostomy 1 year. History of chronic constipation. Has tried multiple over-the-counter medications. No other abdominal symptoms. He was provided samples of Linzess 72 g and requested progress report. Requested previous records of colonoscopies from both [place] and [place], [place]. Recommend follow-up in 2 months.  A statement of "no records" was received from [place], [place]. No records seen from [place].  Today he states he's doing better than he has in a long time. Constipation significantly improved on Linzess, going every other day ranging solid to runny. Abdominal pain he has had for years has improved significantly as well. He would like to try 145 mcg pills. Last colonoscopy was 4+ years ago and was recommended repeat colonoscopy in 2 years due to polyps and/or tumor removal; he is overdue. Denies worsening abdomina pain, N/V, hematochezia, melena, unintentional weight loss, fever, chills. Denies chest pain, dyspnea, dizziness, lightheadedness, syncope, near syncope. Denies any other upper or lower GI symptoms.  Past Medical History:  Diagnosis Date  . Asthma   . Chronic back pain   . Collagen vascular disease (HCC)   . Colonic mass    History, s/p removal; per patient.  . Coronary atherosclerosis of native coronary artery    Dense coronary atherosclerosis by chest CT November  2014   . Essential hypertension, benign   . Essential tremor   . History of chicken pox   . History of [place] measles   . Sciatica     Past Surgical History:  Procedure Laterality Date  . ABDOMINAL SURGERY    . CIRCUMCISION    . COLON SURGERY     for mass removal  . SHOULDER ACROMIOPLASTY Right 06/12/2014   Procedure: RIGHT SHOULDER ACROMIOPLASTY;  Surgeon: Darreld Mclean, MD;  Location: [facility];  Service: Orthopedics;  Laterality: Right;  . SHOULDER OPEN ROTATOR CUFF REPAIR Right 06/12/2014   Procedure: OPEN REPAIR ROTATOR CUFF RIGHT ;  Surgeon: Darreld Mclean, MD;  Location: [facility];  Service: Orthopedics;  Laterality: Right;  . TONSILLECTOMY      Current Outpatient Prescriptions  Medication Sig Dispense Refill  . amLODipine (NORVASC) 5 MG tablet TAKE 1 TABLET BY MOUTH ONCE DAILY. 90 tablet 0  . aspirin EC 81 MG tablet Take 1 tablet (81 mg total) by mouth daily. 90 tablet 3  . gabapentin (NEURONTIN) 400 MG capsule Take 400 mg by mouth 3 (three) times daily.    Marland Kitchen HYDROcodone-acetaminophen (NORCO/VICODIN) 5-325 MG tablet Take 1 tablet by mouth every 6 (six) hours as needed for moderate pain (Must last 30 days.Do not take and drive a car or use machinery.). 85 tablet 0  . ipratropium-albuterol (DUONEB) 0.5-2.5 (3) MG/3ML SOLN Take 3 mLs by nebulization every 6 (six) hours as needed. 360 mL 0  . linaclotide (LINZESS) 72 MCG capsule Take 1 capsule (72 mcg total) by  mouth daily before breakfast. 30 capsule 3  . metoprolol succinate (TOPROL-XL) 25 MG 24 hr tablet TAKE 1 TABLET BY MOUTH TWICE DAILY 60 tablet 3  . naproxen (NAPROSYN) 500 MG tablet TAKE (1) TABLET BY MOUTH TWICE A DAY WITH MEALS (BREAKFAST AND SUPPER) 60 tablet 5  . nitroGLYCERIN (NITROSTAT) 0.4 MG SL tablet Place 1 tablet (0.4 mg total) under the tongue every 5 (five) minutesas needed for chest pain. 25 tablet 3  . temazepam (RESTORIL) 15 MG capsule Take 15 mg by mouth at bedtime as needed for sleep.     No current  facility-administered medications for this visit.     Allergies as of 03/17/2017  . (No Known Allergies)    Family History  Problem Relation Age of Onset  . COPD Father   . Cancer Father   . Cancer Mother   . Heart attack Brother   . Cancer Sister   . Colon cancer Neg Hx     Social History   Social History  . Marital status: Widowed    Spouse name: N/A  . Number of children: N/A  . Years of education: N/A   Social History Main Topics  . Smoking status: Current Every Day Smoker    Packs/day: 1.00    Years: 21.00    Types: Cigarettes    Start date: 08/28/1969  . Smokeless tobacco: Never Used  . Alcohol use No  . Drug use: No  . Sexual activity: Yes    Partners: Female   Other Topics Concern  . None   Social History Narrative  . None    Review of Systems: General: Negative for anorexia, weight loss, fever, chills, fatigue, weakness. ENT: Negative for hoarseness, difficulty swallowing. CV: Negative for chest pain, angina, palpitations, peripheral edema.  Respiratory: Negative for dyspnea at rest, cough, sputum, wheezing.  GI: See history of present illness. Endo: Negative for unusual weight change.  Heme: Negative for bruising or bleeding.   Physical Exam: BP 91/64   Pulse 72   Temp 98.4 F (36.9 C) (Oral)   Ht 5\' 10"  (1.778 m)   Wt 163 lb 12.8 oz (74.3 kg)   BMI 23.50 kg/m  General:   Alert and oriented. Pleasant and cooperative. Well-nourished and well-developed.  Eyes:  Without icterus, sclera clear and conjunctiva pink.  Ears:  Normal auditory acuity. Cardiovascular:  S1, S2 present without murmurs appreciated. Extremities without clubbing or edema. Respiratory:  Clear to auscultation bilaterally. No wheezes, rales, or rhonchi. No distress.  Gastrointestinal:  +BS, soft, non-tender and non-distended. No HSM noted. No guarding or rebound. No masses appreciated.  Rectal:  Deferred  Musculoskalatal:  Symmetrical without gross deformities. Neurologic:   Alert and oriented x4;  grossly normal neurologically. Psych:  Alert and cooperative. Normal mood and affect. Heme/Lymph/Immune: No excessive bruising noted.    03/17/2017 2:06 PM   Disclaimer: This note was dictated with voice recognition software. Similar sounding words can inadvertently be transcribed and may not be corrected upon review.

## 2017-03-17 NOTE — Patient Instructions (Signed)
1. I am giving the samples of Linzess 145 g to last 1 week. 2. Call us in 1 week and let us know if you prefer the higher dosage. 3. We will schedule your procedure for you. 4. Further recommendations to be made after your procedure. 5. Return for follow-up in 3 months. 6. Call us for any worsening or severe symptoms.

## 2017-03-17 NOTE — Assessment & Plan Note (Signed)
Significantly improved on Linzess 72 g. Has a bowel movement every other day which ranges from runny 2 formed. Abdominal pain associated with his constipation has improved significantly as well. He is asking to try 145 g pills to see if this improves anything any better. I will provide him with samples to last a week and request a progress report in one week. If he desires after the trial of 145 g we can change his prescription. Return for follow-up in 3 months.

## 2017-03-18 ENCOUNTER — Encounter: Payer: Self-pay | Admitting: Orthopaedic Surgery

## 2017-03-18 ENCOUNTER — Ambulatory Visit (INDEPENDENT_AMBULATORY_CARE_PROVIDER_SITE_OTHER): Payer: Medicare Other | Admitting: Orthopaedic Surgery

## 2017-03-18 DIAGNOSIS — G8929 Other chronic pain: Secondary | ICD-10-CM

## 2017-03-18 DIAGNOSIS — M25511 Pain in right shoulder: Secondary | ICD-10-CM | POA: Diagnosis not present

## 2017-03-18 DIAGNOSIS — F17210 Nicotine dependence, cigarettes, uncomplicated: Secondary | ICD-10-CM | POA: Diagnosis not present

## 2017-03-18 MED ORDER — HYDROCODONE-ACETAMINOPHEN 5-325 MG PO TABS: 1.0000 | ORAL_TABLET | Freq: Four times a day (QID) | ORAL | 0 refills | Status: DC | PRN

## 2017-03-18 NOTE — Progress Notes (Signed)
PROCEDURE NOTE:  The patient request injection, verbal consent was obtained.  The right shoulder was prepped appropriately after time out was performed.   Sterile technique was observed and injection of 1 cc of Depo-Medrol 40 mg with several cc's of plain xylocaine. Anesthesia was provided by ethyl chloride and a 20-gauge needle was used to inject the shoulder area. A posterior approach was used.  The injection was tolerated well.  A band aid dressing was applied.  The patient was advised to apply ice later today and tomorrow to the injection sight as needed.  Encounter Diagnoses  Name Primary?  . Chronic right shoulder pain Yes  . Cigarette nicotine dependence without complication    Return in three months.  Call if any problem.  Precautions discussed.   I have reviewed the [place] Controlled Substance Reporting System web site prior to prescribing narcotic medicine for this patient.  Electronically Signed Darreld McleanWayne Athalie Newhard, MD 6/21/201810:11 AM

## 2017-03-29 ENCOUNTER — Telehealth: Payer: Self-pay

## 2017-03-29 NOTE — Telephone Encounter (Signed)
Pt came by office and wanted to update his med list. He takes Restoril 30mg  in addition to 15mg  at bedtime (total of 45mg ). Med list updated.

## 2017-03-30 ENCOUNTER — Telehealth: Payer: Self-pay | Admitting: Internal Medicine

## 2017-03-30 MED ORDER — LINACLOTIDE 145 MCG PO CAPS: 145.0000 ug | ORAL_CAPSULE | Freq: Every day | ORAL | 11 refills | Status: DC

## 2017-03-30 NOTE — Telephone Encounter (Signed)
Pt called to say that he had seen EG recently and was given samples of Linzess 145 and he has tried them for a week and are working well. He needs a prescription called into [facility] in [place].

## 2017-03-30 NOTE — Telephone Encounter (Signed)
rx done

## 2017-03-30 NOTE — Telephone Encounter (Signed)
Routing to the refill box. 

## 2017-03-30 NOTE — Addendum Note (Signed)
Addended by: Tiffany KocherLEWIS, Loxley Cibrian S on: 03/30/2017 03:12 PM   Modules accepted: Orders

## 2017-04-22 NOTE — Patient Instructions (Signed)
ELIA KEENUM  04/22/2017     @PREFPERIOPPHARMACY @   Your procedure is scheduled on  05/03/2017   Report to [facility] at  845  A.M.  Call this number if you have problems the morning of surgery:  [phone]   Remember:  Do not eat food or drink liquids after midnight.  Take these medicines the morning of surgery with A SIP OF WATER  Amlodipine, neurontin, hydrocodone, metoprolol. Use your duoneb before you come.   Do not wear jewelry, make-up or nail polish.  Do not wear lotions, powders, or perfumes, or deoderant.  Do not shave 48 hours prior to surgery.  Men may shave face and neck.  Do not bring valuables to the hospital.  [facility] is not responsible for any belongings or valuables.  Contacts, dentures or bridgework may not be worn into surgery.  Leave your suitcase in the car.  After surgery it may be brought to your room.  For patients admitted to the hospital, discharge time will be determined by your treatment team.  Patients discharged the day of surgery will not be allowed to drive home.   Name and phone number of your driver:   family Special instructions:  Follow the diet and prep instructions given to you by Dr Charna Busman office.  Please read over the following fact sheets that you were given. Anesthesia Post-op Instructions and Care and Recovery After Surgery       Colonoscopy, Adult A colonoscopy is an exam to look at the entire large intestine. During the exam, a lubricated, bendable tube is inserted into the anus and then passed into the rectum, colon, and other parts of the large intestine. A colonoscopy is often done as a part of normal colorectal screening or in response to certain symptoms, such as anemia, persistent diarrhea, abdominal pain, and blood in the stool. The exam can help screen for and diagnose medical problems, including:  Tumors.  Polyps.  Inflammation.  Areas of bleeding.  Tell a health care provider about:  Any  allergies you have.  All medicines you are taking, including vitamins, herbs, eye drops, creams, and over-the-counter medicines.  Any problems you or family members have had with anesthetic medicines.  Any blood disorders you have.  Any surgeries you have had.  Any medical conditions you have.  Any problems you have had passing stool. What are the risks? Generally, this is a safe procedure. However, problems may occur, including:  Bleeding.  A tear in the intestine.  A reaction to medicines given during the exam.  Infection (rare).  What happens before the procedure? Eating and drinking restrictions Follow instructions from your health care provider about eating and drinking, which may include:  A few days before the procedure - follow a low-fiber diet. Avoid nuts, seeds, dried fruit, raw fruits, and vegetables.  1-3 days before the procedure - follow a clear liquid diet. Drink only clear liquids, such as clear broth or bouillon, black coffee or tea, clear juice, clear soft drinks or sports drinks, gelatin dessert, and popsicles. Avoid any liquids that contain red or purple dye.  On the day of the procedure - do not eat or drink anything during the 2 hours before the procedure, or within the time period that your health care provider recommends.  Bowel prep If you were prescribed an oral bowel prep to clean out your colon:  Take it as told by your health care  provider. Starting the day before your procedure, you will need to drink a large amount of medicated liquid. The liquid will cause you to have multiple loose stools until your stool is almost clear or light green.  If your skin or anus gets irritated from diarrhea, you may use these to relieve the irritation: ? Medicated wipes, such as adult wet wipes with aloe and vitamin E. ? A skin soothing-product like petroleum jelly.  If you vomit while drinking the bowel prep, take a break for up to 60 minutes and then begin the  bowel prep again. If vomiting continues and you cannot take the bowel prep without vomiting, call your health care provider.  General instructions  Ask your health care provider about changing or stopping your regular medicines. This is especially important if you are taking diabetes medicines or blood thinners.  Plan to have someone take you home from the hospital or clinic. What happens during the procedure?  An IV tube may be inserted into one of your veins.  You will be given medicine to help you relax (sedative).  To reduce your risk of infection: ? Your health care team will wash or sanitize their hands. ? Your anal area will be washed with soap.  You will be asked to lie on your side with your knees bent.  Your health care provider will lubricate a long, thin, flexible tube. The tube will have a camera and a light on the end.  The tube will be inserted into your anus.  The tube will be gently eased through your rectum and colon.  Air will be delivered into your colon to keep it open. You may feel some pressure or cramping.  The camera will be used to take images during the procedure.  A small tissue sample may be removed from your body to be examined under a microscope (biopsy). If any potential problems are found, the tissue will be sent to a lab for testing.  If small polyps are found, your health care provider may remove them and have them checked for cancer cells.  The tube that was inserted into your anus will be slowly removed. The procedure may vary among health care providers and hospitals. What happens after the procedure?  Your blood pressure, heart rate, breathing rate, and blood oxygen level will be monitored until the medicines you were given have worn off.  Do not drive for 24 hours after the exam.  You may have a small amount of blood in your stool.  You may pass gas and have mild abdominal cramping or bloating due to the air that was used to inflate  your colon during the exam.  It is up to you to get the results of your procedure. Ask your health care provider, or the department performing the procedure, when your results will be ready. This information is not intended to replace advice given to you by your health care provider. Make sure you discuss any questions you have with your health care provider. Document Released: 09/11/2000 Document Revised: 07/15/2016 Document Reviewed: 11/26/2015 Elsevier Interactive Patient Education  2018 [employer]  Colonoscopy, Adult, Care After This sheet gives you information about how to care for yourself after your procedure. Your health care provider may also give you more specific instructions. If you have problems or questions, contact your health care provider. What can I expect after the procedure? After the procedure, it is common to have:  A small amount of blood in your stool  for 24 hours after the procedure.  Some gas.  Mild abdominal cramping or bloating.  Follow these instructions at home: General instructions   For the first 24 hours after the procedure: ? Do not drive or use machinery. ? Do not sign important documents. ? Do not drink alcohol. ? Do your regular daily activities at a slower pace than normal. ? Eat soft, easy-to-digest foods. ? Rest often.  Take over-the-counter or prescription medicines only as told by your health care provider.  It is up to you to get the results of your procedure. Ask your health care provider, or the department performing the procedure, when your results will be ready. Relieving cramping and bloating  Try walking around when you have cramps or feel bloated.  Apply heat to your abdomen as told by your health care provider. Use a heat source that your health care provider recommends, such as a moist heat pack or a heating pad. ? Place a towel between your skin and the heat source. ? Leave the heat on for 20-30 minutes. ? Remove the  heat if your skin turns bright red. This is especially important if you are unable to feel pain, heat, or cold. You may have a greater risk of getting burned. Eating and drinking  Drink enough fluid to keep your urine clear or pale yellow.  Resume your normal diet as instructed by your health care provider. Avoid heavy or fried foods that are hard to digest.  Avoid drinking alcohol for as long as instructed by your health care provider. Contact a health care provider if:  You have blood in your stool 2-3 days after the procedure. Get help right away if:  You have more than a small spotting of blood in your stool.  You pass large blood clots in your stool.  Your abdomen is swollen.  You have nausea or vomiting.  You have a fever.  You have increasing abdominal pain that is not relieved with medicine. This information is not intended to replace advice given to you by your health care provider. Make sure you discuss any questions you have with your health care provider. Document Released: 04/28/2004 Document Revised: 06/08/2016 Document Reviewed: 11/26/2015 Elsevier Interactive Patient Education  2018 Elsevier Inc.  Monitored Anesthesia Care Anesthesia is a term that refers to techniques, procedures, and medicines that help a person stay safe and comfortable during a medical procedure. Monitored anesthesia care, or sedation, is one type of anesthesia. Your anesthesia specialist may recommend sedation if you will be having a procedure that does not require you to be unconscious, such as:  Cataract surgery.  A dental procedure.  A biopsy.  A colonoscopy.  During the procedure, you may receive a medicine to help you relax (sedative). There are three levels of sedation:  Mild sedation. At this level, you may feel awake and relaxed. You will be able to follow directions.  Moderate sedation. At this level, you will be sleepy. You may not remember the procedure.  Deep sedation. At  this level, you will be asleep. You will not remember the procedure.  The more medicine you are given, the deeper your level of sedation will be. Depending on how you respond to the procedure, the anesthesia specialist may change your level of sedation or the type of anesthesia to fit your needs. An anesthesia specialist will monitor you closely during the procedure. Let your health care provider know about:  Any allergies you have.  All medicines you are taking,  including vitamins, herbs, eye drops, creams, and over-the-counter medicines.  Any use of steroids (by mouth or as a cream).  Any problems you or family members have had with sedatives and anesthetic medicines.  Any blood disorders you have.  Any surgeries you have had.  Any medical conditions you have, such as sleep apnea.  Whether you are pregnant or may be pregnant.  Any use of cigarettes, alcohol, or street drugs. What are the risks? Generally, this is a safe procedure. However, problems may occur, including:  Getting too much medicine (oversedation).  Nausea.  Allergic reaction to medicines.  Trouble breathing. If this happens, a breathing tube may be used to help with breathing. It will be removed when you are awake and breathing on your own.  Heart trouble.  Lung trouble.  Before the procedure Staying hydrated Follow instructions from your health care provider about hydration, which may include:  Up to 2 hours before the procedure - you may continue to drink clear liquids, such as water, clear fruit juice, black coffee, and plain tea.  Eating and drinking restrictions Follow instructions from your health care provider about eating and drinking, which may include:  8 hours before the procedure - stop eating heavy meals or foods such as meat, fried foods, or fatty foods.  6 hours before the procedure - stop eating light meals or foods, such as toast or cereal.  6 hours before the procedure - stop  drinking milk or drinks that contain milk.  2 hours before the procedure - stop drinking clear liquids.  Medicines Ask your health care provider about:  Changing or stopping your regular medicines. This is especially important if you are taking diabetes medicines or blood thinners.  Taking medicines such as aspirin and ibuprofen. These medicines can thin your blood. Do not take these medicines before your procedure if your health care provider instructs you not to.  Tests and exams  You will have a physical exam.  You may have blood tests done to show: ? How well your kidneys and liver are working. ? How well your blood can clot.  General instructions  Plan to have someone take you home from the hospital or clinic.  If you will be going home right after the procedure, plan to have someone with you for 24 hours.  What happens during the procedure?  Your blood pressure, heart rate, breathing, level of pain and overall condition will be monitored.  An IV tube will be inserted into one of your veins.  Your anesthesia specialist will give you medicines as needed to keep you comfortable during the procedure. This may mean changing the level of sedation.  The procedure will be performed. After the procedure  Your blood pressure, heart rate, breathing rate, and blood oxygen level will be monitored until the medicines you were given have worn off.  Do not drive for 24 hours if you received a sedative.  You may: ? Feel sleepy, clumsy, or nauseous. ? Feel forgetful about what happened after the procedure. ? Have a sore throat if you had a breathing tube during the procedure. ? Vomit. This information is not intended to replace advice given to you by your health care provider. Make sure you discuss any questions you have with your health care provider. Document Released: 06/10/2005 Document Revised: 02/21/2016 Document Reviewed: 01/05/2016 Elsevier Interactive Patient Education   2018 [employer] Inc[employer] Monitored Anesthesia Care, Care After These instructions provide you with information about caring for yourself after your  procedure. Your health care provider may also give you more specific instructions. Your treatment has been planned according to current medical practices, but problems sometimes occur. Call your health care provider if you have any problems or questions after your procedure. What can I expect after the procedure? After your procedure, it is common to:  Feel sleepy for several hours.  Feel clumsy and have poor balance for several hours.  Feel forgetful about what happened after the procedure.  Have poor judgment for several hours.  Feel nauseous or vomit.  Have a sore throat if you had a breathing tube during the procedure.  Follow these instructions at home: For at least 24 hours after the procedure:   Do not: ? Participate in activities in which you could fall or become injured. ? Drive. ? Use heavy machinery. ? Drink alcohol. ? Take sleeping pills or medicines that cause drowsiness. ? Make important decisions or sign legal documents. ? Take care of children on your own.  Rest. Eating and drinking  Follow the diet that is recommended by your health care provider.  If you vomit, drink water, juice, or soup when you can drink without vomiting.  Make sure you have little or no nausea before eating solid foods. General instructions  Have a responsible adult stay with you until you are awake and alert.  Take over-the-counter and prescription medicines only as told by your health care provider.  If you smoke, do not smoke without supervision.  Keep all follow-up visits as told by your health care provider. This is important. Contact a health care provider if:  You keep feeling nauseous or you keep vomiting.  You feel light-headed.  You develop a rash.  You have a fever. Get help right away if:  You have trouble  breathing. This information is not intended to replace advice given to you by your health care provider. Make sure you discuss any questions you have with your health care provider. Document Released: 01/05/2016 Document Revised: 05/06/2016 Document Reviewed: 01/05/2016 Elsevier Interactive Patient Education  [employer]2018 [employer].

## 2017-04-27 ENCOUNTER — Encounter (HOSPITAL_COMMUNITY)
Admission: RE | Admit: 2017-04-27 | Discharge: 2017-04-27 | Disposition: A | Discharge status: Home / Self Care | Payer: Medicare Other | Source: Ambulatory Visit | Attending: Internal Medicine | Admitting: Internal Medicine

## 2017-04-27 ENCOUNTER — Encounter (HOSPITAL_COMMUNITY): Payer: Self-pay

## 2017-04-27 ENCOUNTER — Telehealth: Payer: Self-pay | Admitting: Orthopaedic Surgery

## 2017-04-27 DIAGNOSIS — Z86010 Personal history of colonic polyps: Secondary | ICD-10-CM | POA: Diagnosis present

## 2017-04-27 LAB — BASIC METABOLIC PANEL
Anion gap: 6 (ref 5–15)
BUN: 31 mg/dL — ABNORMAL HIGH (ref 6–20)
CALCIUM: 9.5 mg/dL (ref 8.9–10.3)
CHLORIDE: 105 mmol/L (ref 101–111)
CO2: 26 mmol/L (ref 22–32)
CREATININE: 0.83 mg/dL (ref 0.61–1.24)
Glucose, Bld: 111 mg/dL — ABNORMAL HIGH (ref 65–99)
Potassium: 4.8 mmol/L (ref 3.5–5.1)
Sodium: 137 mmol/L (ref 135–145)

## 2017-04-27 LAB — CBC WITH DIFFERENTIAL/PLATELET
Basophils Absolute: 0 10*3/uL (ref 0.0–0.1)
Basophils Relative: 0 %
EOS ABS: 0.1 10*3/uL (ref 0.0–0.7)
Eosinophils Relative: 1 %
HCT: 47.5 % (ref 39.0–52.0)
HEMOGLOBIN: 16.4 g/dL (ref 13.0–17.0)
Lymphocytes Relative: 20 %
Lymphs Abs: 1.7 10*3/uL (ref 0.7–4.0)
MCH: 32.4 pg (ref 26.0–34.0)
MCHC: 34.5 g/dL (ref 30.0–36.0)
MCV: 93.9 fL (ref 78.0–100.0)
MONOS PCT: 8 %
Monocytes Absolute: 0.6 10*3/uL (ref 0.1–1.0)
NEUTROS PCT: 71 %
Neutro Abs: 5.9 10*3/uL (ref 1.7–7.7)
Platelets: 230 10*3/uL (ref 150–400)
RBC: 5.06 MIL/uL (ref 4.22–5.81)
RDW: 12.9 % (ref 11.5–15.5)
WBC: 8.3 10*3/uL (ref 4.0–10.5)

## 2017-04-27 MED ORDER — HYDROCODONE-ACETAMINOPHEN 5-325 MG PO TABS
1.0000 | ORAL_TABLET | Freq: Four times a day (QID) | ORAL | 0 refills | Status: DC | PRN
Start: 2017-04-27 — End: 2017-06-01

## 2017-04-27 NOTE — Telephone Encounter (Signed)
Patient called for refill:  °HYDROcodone-acetaminophen (NORCO/VICODIN) 5-325 MG tablet 85 tablet   ° ° °

## 2017-04-30 ENCOUNTER — Telehealth: Payer: Self-pay | Admitting: Gastroenterology

## 2017-04-30 NOTE — Telephone Encounter (Signed)
PT CALLED TO SEE IF HE COULD HAVE BUTTER RUM CANDY. OK TO EAT HARD CANDY. TCS MON AUG 6 W/ RMR.

## 2017-05-03 ENCOUNTER — Encounter (HOSPITAL_COMMUNITY)
Admission: RE | Disposition: A | Discharge status: Home / Self Care | Payer: Self-pay | Source: Ambulatory Visit | Attending: Internal Medicine

## 2017-05-03 ENCOUNTER — Ambulatory Visit (HOSPITAL_COMMUNITY)
Admission: RE | Admit: 2017-05-03 | Discharge: 2017-05-03 | Disposition: A | Discharge status: Home / Self Care | Payer: Medicare Other | Source: Ambulatory Visit | Attending: Internal Medicine | Admitting: Internal Medicine

## 2017-05-03 ENCOUNTER — Ambulatory Visit (HOSPITAL_COMMUNITY): Payer: Medicare Other | Admitting: Anesthesiology

## 2017-05-03 ENCOUNTER — Encounter (HOSPITAL_COMMUNITY): Payer: Self-pay

## 2017-05-03 DIAGNOSIS — Z825 Family history of asthma and other chronic lower respiratory diseases: Secondary | ICD-10-CM | POA: Diagnosis not present

## 2017-05-03 DIAGNOSIS — Z7982 Long term (current) use of aspirin: Secondary | ICD-10-CM | POA: Insufficient documentation

## 2017-05-03 DIAGNOSIS — K5730 Diverticulosis of large intestine without perforation or abscess without bleeding: Secondary | ICD-10-CM | POA: Diagnosis not present

## 2017-05-03 DIAGNOSIS — F17210 Nicotine dependence, cigarettes, uncomplicated: Secondary | ICD-10-CM | POA: Insufficient documentation

## 2017-05-03 DIAGNOSIS — Z86010 Personal history of colonic polyps: Secondary | ICD-10-CM | POA: Insufficient documentation

## 2017-05-03 DIAGNOSIS — I2510 Atherosclerotic heart disease of native coronary artery without angina pectoris: Secondary | ICD-10-CM | POA: Insufficient documentation

## 2017-05-03 DIAGNOSIS — K648 Other hemorrhoids: Secondary | ICD-10-CM | POA: Diagnosis not present

## 2017-05-03 DIAGNOSIS — I10 Essential (primary) hypertension: Secondary | ICD-10-CM | POA: Insufficient documentation

## 2017-05-03 DIAGNOSIS — Z79899 Other long term (current) drug therapy: Secondary | ICD-10-CM | POA: Diagnosis not present

## 2017-05-03 DIAGNOSIS — Z1211 Encounter for screening for malignant neoplasm of colon: Secondary | ICD-10-CM | POA: Insufficient documentation

## 2017-05-03 DIAGNOSIS — G8929 Other chronic pain: Secondary | ICD-10-CM | POA: Diagnosis not present

## 2017-05-03 DIAGNOSIS — K6389 Other specified diseases of intestine: Secondary | ICD-10-CM

## 2017-05-03 DIAGNOSIS — K621 Rectal polyp: Secondary | ICD-10-CM | POA: Insufficient documentation

## 2017-05-03 DIAGNOSIS — G250 Essential tremor: Secondary | ICD-10-CM | POA: Insufficient documentation

## 2017-05-03 DIAGNOSIS — Z809 Family history of malignant neoplasm, unspecified: Secondary | ICD-10-CM | POA: Diagnosis not present

## 2017-05-03 DIAGNOSIS — K635 Polyp of colon: Secondary | ICD-10-CM | POA: Diagnosis not present

## 2017-05-03 HISTORY — PX: COLONOSCOPY WITH PROPOFOL: SHX5780

## 2017-05-03 HISTORY — PX: POLYPECTOMY: SHX5525

## 2017-05-03 SURGERY — COLONOSCOPY WITH PROPOFOL
Anesthesia: Monitor Anesthesia Care

## 2017-05-03 MED ORDER — PHENYLEPHRINE HCL 10 MG/ML IJ SOLN
INTRAMUSCULAR | Status: DC | PRN
  Administered 2017-05-03 (×4): 40 ug via INTRAVENOUS

## 2017-05-03 MED ORDER — MIDAZOLAM HCL 2 MG/2ML IJ SOLN
1.0000 mg | INTRAMUSCULAR | Status: AC
  Administered 2017-05-03: 2 mg via INTRAVENOUS

## 2017-05-03 MED ORDER — CHLORHEXIDINE GLUCONATE CLOTH 2 % EX PADS: 6.0000 | MEDICATED_PAD | Freq: Once | CUTANEOUS | Status: DC

## 2017-05-03 MED ORDER — PROPOFOL 500 MG/50ML IV EMUL
INTRAVENOUS | Status: DC | PRN
  Administered 2017-05-03: 300 ug/kg/min via INTRAVENOUS

## 2017-05-03 MED ORDER — FENTANYL CITRATE (PF) 100 MCG/2ML IJ SOLN
INTRAMUSCULAR | Status: AC
  Filled 2017-05-03: qty 2

## 2017-05-03 MED ORDER — LACTATED RINGERS IV SOLN
INTRAVENOUS | Status: DC
  Administered 2017-05-03: 09:00:00 via INTRAVENOUS

## 2017-05-03 MED ORDER — MIDAZOLAM HCL 2 MG/2ML IJ SOLN
INTRAMUSCULAR | Status: AC
  Filled 2017-05-03: qty 2

## 2017-05-03 MED ORDER — PROPOFOL 10 MG/ML IV BOLUS
INTRAVENOUS | Status: AC
  Filled 2017-05-03: qty 40

## 2017-05-03 MED ORDER — FENTANYL CITRATE (PF) 100 MCG/2ML IJ SOLN
25.0000 ug | Freq: Once | INTRAMUSCULAR | Status: AC
  Administered 2017-05-03: 25 ug via INTRAVENOUS

## 2017-05-03 MED ORDER — IPRATROPIUM-ALBUTEROL 0.5-2.5 (3) MG/3ML IN SOLN
3.0000 mL | Freq: Once | RESPIRATORY_TRACT | Status: AC
  Administered 2017-05-03: 3 mL via RESPIRATORY_TRACT

## 2017-05-03 MED ORDER — PROPOFOL 10 MG/ML IV BOLUS
INTRAVENOUS | Status: DC | PRN
  Administered 2017-05-03: 30 mg via INTRAVENOUS
  Administered 2017-05-03: 20 mg via INTRAVENOUS

## 2017-05-03 MED ORDER — LIDOCAINE HCL (CARDIAC) 20 MG/ML IV SOLN
INTRAVENOUS | Status: DC | PRN
  Administered 2017-05-03: 50 mg via INTRAVENOUS

## 2017-05-03 MED ORDER — IPRATROPIUM-ALBUTEROL 0.5-2.5 (3) MG/3ML IN SOLN
RESPIRATORY_TRACT | Status: AC
  Filled 2017-05-03: qty 3

## 2017-05-03 NOTE — Discharge Instructions (Addendum)
Colonoscopy Discharge Instructions  Read the instructions outlined below and refer to this sheet in the next few weeks. These discharge instructions provide you with general information on caring for yourself after you leave the hospital. Your doctor may also give you specific instructions. While your treatment has been planned according to the most current medical practices available, unavoidable complications occasionally occur. If you have any problems or questions after discharge, call Dr. Jena Gaussourk at [phone]. ACTIVITY  You may resume your regular activity, but move at a slower pace for the next 24 hours.   Take frequent rest periods for the next 24 hours.   Walking will help get rid of the air and reduce the bloated feeling in your belly (abdomen).   No driving for 24 hours (because of the medicine (anesthesia) used during the test).    Do not sign any important legal documents or operate any machinery for 24 hours (because of the anesthesia used during the test).  NUTRITION  Drink plenty of fluids.   You may resume your normal diet as instructed by your doctor.   Begin with a light meal and progress to your normal diet. Heavy or fried foods are harder to digest and may make you feel sick to your stomach (nauseated).   Avoid alcoholic beverages for 24 hours or as instructed.  MEDICATIONS  You may resume your normal medications unless your doctor tells you otherwise.  WHAT YOU CAN EXPECT TODAY  Some feelings of bloating in the abdomen.   Passage of more gas than usual.   Spotting of blood in your stool or on the toilet paper.  IF YOU HAD POLYPS REMOVED DURING THE COLONOSCOPY:  No aspirin products for 7 days or as instructed.   No alcohol for 7 days or as instructed.   Eat a soft diet for the next 24 hours.  FINDING OUT THE RESULTS OF YOUR TEST Not all test results are available during your visit. If your test results are not back during the visit, make an appointment  with your caregiver to find out the results. Do not assume everything is normal if you have not heard from your caregiver or the medical facility. It is important for you to follow up on all of your test results.  SEEK IMMEDIATE MEDICAL ATTENTION IF:  You have more than a spotting of blood in your stool.   Your belly is swollen (abdominal distention).   You are nauseated or vomiting.   You have a temperature over 101.   You have abdominal pain or discomfort that is severe or gets worse throughout the day.    Diverticulosis and colon polyp information provided - 2 small polyps removed from your colon today.  Further recommendations to follow pending review of pathology report  Office visit in 3 months.  Diverticulosis Diverticulosis is a condition that develops when small pouches (diverticula) form in the wall of the large intestine (colon). The colon is where water is absorbed and stool is formed. The pouches form when the inside layer of the colon pushes through weak spots in the outer layers of the colon. You may have a few pouches or many of them. What are the causes? The cause of this condition is not known. What increases the risk? The following factors may make you more likely to develop this condition:  Being older than age 60. Your risk for this condition increases with age. Diverticulosis is rare among people younger than age 60. By age 60, many people  have it.  Eating a low-fiber diet.  Having frequent constipation.  Being overweight.  Not getting enough exercise.  Smoking.  Taking over-the-counter pain medicines, like aspirin and ibuprofen.  Having a family history of diverticulosis.  What are the signs or symptoms? In most people, there are no symptoms of this condition. If you do have symptoms, they may include:  Bloating.  Cramps in the abdomen.  Constipation or diarrhea.  Pain in the lower left side of the abdomen.  How is this diagnosed? This  condition is most often diagnosed during an exam for other colon problems. Because diverticulosis usually has no symptoms, it often cannot be diagnosed independently. This condition may be diagnosed by:  Using a flexible scope to examine the colon (colonoscopy).  Taking an X-ray of the colon after dye has been put into the colon (barium enema).  Doing a CT scan.  How is this treated? You may not need treatment for this condition if you have never developed an infection related to diverticulosis. If you have had an infection before, treatment may include:  Eating a high-fiber diet. This may include eating more fruits, vegetables, and grains.  Taking a fiber supplement.  Taking a live bacteria supplement (probiotic).  Taking medicine to relax your colon.  Taking antibiotic medicines.  Follow these instructions at home:  Drink 6-8 glasses of water or more each day to prevent constipation.  Try not to strain when you have a bowel movement.  If you have had an infection before: ? Eat more fiber as directed by your health care provider or your diet and nutrition specialist (dietitian). ? Take a fiber supplement or probiotic, if your health care provider approves.  Take over-the-counter and prescription medicines only as told by your health care provider.  If you were prescribed an antibiotic, take it as told by your health care provider. Do not stop taking the antibiotic even if you start to feel better.  Keep all follow-up visits as told by your health care provider. This is important. Contact a health care provider if:  You have pain in your abdomen.  You have bloating.  You have cramps.  You have not had a bowel movement in 3 days. Get help right away if:  Your pain gets worse.  Your bloating becomes very bad.  You have a fever or chills, and your symptoms suddenly get worse.  You vomit.  You have bowel movements that are bloody or black.  You have bleeding from  your rectum. Summary  Diverticulosis is a condition that develops when small pouches (diverticula) form in the wall of the large intestine (colon).  You may have a few pouches or many of them.  This condition is most often diagnosed during an exam for other colon problems.  If you have had an infection related to diverticulosis, treatment may include increasing the fiber in your diet, taking supplements, or taking medicines. This information is not intended to replace advice given to you by your health care provider. Make sure you discuss any questions you have with your health care provider. Document Released: 06/11/2004 Document Revised: 08/03/2016 Document Reviewed: 08/03/2016 Elsevier Interactive Patient Education  2017 [employer]  High-Fiber Diet Fiber, also called dietary fiber, is a type of carbohydrate found in fruits, vegetables, whole grains, and beans. A high-fiber diet can have many health benefits. Your health care provider may recommend a high-fiber diet to help:  Prevent constipation. Fiber can make your bowel movements more regular.  Lower  your cholesterol.  Relieve hemorrhoids, uncomplicated diverticulosis, or irritable bowel syndrome.  Prevent overeating as part of a weight-loss plan.  Prevent heart disease, type 2 diabetes, and certain cancers.  What is my plan? The recommended daily intake of fiber includes:  38 grams for men under age 65.  30 grams for men over age 60.  25 grams for women under age 41.  21 grams for women over age 4.  You can get the recommended daily intake of dietary fiber by eating a variety of fruits, vegetables, grains, and beans. Your health care provider may also recommend a fiber supplement if it is not possible to get enough fiber through your diet. What do I need to know about a high-fiber diet?  Fiber supplements have not been widely studied for their effectiveness, so it is better to get fiber through food  sources.  Always check the fiber content on thenutrition facts label of any prepackaged food. Look for foods that contain at least 5 grams of fiber per serving.  Ask your dietitian if you have questions about specific foods that are related to your condition, especially if those foods are not listed in the following section.  Increase your daily fiber consumption gradually. Increasing your intake of dietary fiber too quickly may cause bloating, cramping, or gas.  Drink plenty of water. Water helps you to digest fiber. What foods can I eat? Grains Whole-grain breads. Multigrain cereal. Oats and oatmeal. Brown rice. Barley. Bulgur wheat. Millet. Bran muffins. Popcorn. Rye wafer crackers. Vegetables Sweet potatoes. Spinach. Kale. Artichokes. Cabbage. Broccoli. Green peas. Carrots. Squash. Fruits Berries. Pears. Apples. Oranges. Avocados. Prunes and raisins. Dried figs. Meats and Other Protein Sources Navy, kidney, pinto, and soy beans. Split peas. Lentils. Nuts and seeds. Dairy Fiber-fortified yogurt. Beverages Fiber-fortified soy milk. Fiber-fortified orange juice. Other Fiber bars. The items listed above may not be a complete list of recommended foods or beverages. Contact your dietitian for more options. What foods are not recommended? Grains White bread. Pasta made with refined flour. White rice. Vegetables Fried potatoes. Canned vegetables. Well-cooked vegetables. Fruits Fruit juice. Cooked, strained fruit. Meats and Other Protein Sources Fatty cuts of meat. Fried Environmental education officer or fried fish. Dairy Milk. Yogurt. Cream cheese. Sour cream. Beverages Soft drinks. Other Cakes and pastries. Butter and oils. The items listed above may not be a complete list of foods and beverages to avoid. Contact your dietitian for more information. What are some tips for including high-fiber foods in my diet?  Eat a wide variety of high-fiber foods.  Make sure that half of all grains consumed  each day are whole grains.  Replace breads and cereals made from refined flour or white flour with whole-grain breads and cereals.  Replace white rice with brown rice, bulgur wheat, or millet.  Start the day with a breakfast that is high in fiber, such as a cereal that contains at least 5 grams of fiber per serving.  Use beans in place of meat in soups, salads, or pasta.  Eat high-fiber snacks, such as berries, raw vegetables, nuts, or popcorn. This information is not intended to replace advice given to you by your health care provider. Make sure you discuss any questions you have with your health care provider. Document Released: 09/14/2005 Document Revised: 02/20/2016 Document Reviewed: 02/27/2014 Elsevier Interactive Patient Education  2017 [employer].  Colon Polyps Polyps are tissue growths inside the body. Polyps can grow in many places, including the large intestine (colon). A polyp may be a  round bump or a mushroom-shaped growth. You could have one polyp or several. Most colon polyps are noncancerous (benign). However, some colon polyps can become cancerous over time. What are the causes? The exact cause of colon polyps is not known. What increases the risk? This condition is more likely to develop in people who:  Have a family history of colon cancer or colon polyps.  Are older than 17 or older than 45 if they are African American.  Have inflammatory bowel disease, such as ulcerative colitis or Crohn disease.  Are overweight.  Smoke cigarettes.  Do not get enough exercise.  Drink too much alcohol.  Eat a diet that is: ? High in fat and red meat. ? Low in fiber.  Had childhood cancer that was treated with abdominal radiation.  What are the signs or symptoms? Most polyps do not cause symptoms. If you have symptoms, they may include:  Blood coming from your rectum when having a bowel movement.  Blood in your stool.The stool may look dark red or black.  A  change in bowel habits, such as constipation or diarrhea.  How is this diagnosed? This condition is diagnosed with a colonoscopy. This is a procedure that uses a lighted, flexible scope to look at the inside of your colon. How is this treated? Treatment for this condition involves removing any polyps that are found. Those polyps will then be tested for cancer. If cancer is found, your health care provider will talk to you about options for colon cancer treatment. Follow these instructions at home: Diet  Eat plenty of fiber, such as fruits, vegetables, and whole grains.  Eat foods that are high in calcium and vitamin D, such as milk, cheese, yogurt, eggs, liver, fish, and broccoli.  Limit foods high in fat, red meats, and processed meats, such as hot dogs, sausage, bacon, and lunch meats.  Maintain a healthy weight, or lose weight if recommended by your health care provider. General instructions  Do not smoke cigarettes.  Do not drink alcohol excessively.  Keep all follow-up visits as told by your health care provider. This is important. This includes keeping regularly scheduled colonoscopies. Talk to your health care provider about when you need a colonoscopy.  Exercise every day or as told by your health care provider. Contact a health care provider if:  You have new or worsening bleeding during a bowel movement.  You have new or increased blood in your stool.  You have a change in bowel habits.  You unexpectedly lose weight. This information is not intended to replace advice given to you by your health care provider. Make sure you discuss any questions you have with your health care provider. Document Released: 06/10/2004 Document Revised: 02/20/2016 Document Reviewed: 08/05/2015 Elsevier Interactive Patient Education  [employer].

## 2017-05-03 NOTE — Anesthesia Preprocedure Evaluation (Signed)
Anesthesia Evaluation  Patient identified by MRN, date of birth, ID band Patient awake    Reviewed: Allergy & Precautions, H&P , NPO status , Patient's Chart, lab work & pertinent test results, reviewed documented beta blocker date and time   Airway Mallampati: II  TM Distance: >3 FB     Dental  (+) Poor Dentition, Edentulous Upper, Partial Lower   Pulmonary asthma , COPD (am cough), Current Smoker,    breath sounds clear to auscultation       Cardiovascular hypertension, Pt. on medications and Pt. on home beta blockers (-) angina+ CAD and + Past MI   Rhythm:Regular Rate:Bradycardia     Neuro/Psych Chronic LBP  Neuromuscular disease    GI/Hepatic neg GERD  ,  Endo/Other    Renal/GU      Musculoskeletal   Abdominal   Peds  Hematology   Anesthesia Other Findings   Reproductive/Obstetrics                             Anesthesia Physical Anesthesia Plan  ASA: III  Anesthesia Plan: MAC   Post-op Pain Management:    Induction: Intravenous  PONV Risk Score and Plan:   Airway Management Planned: Simple Face Mask  Additional Equipment:   Intra-op Plan:   Post-operative Plan:   Informed Consent: I have reviewed the patients History and Physical, chart, labs and discussed the procedure including the risks, benefits and alternatives for the proposed anesthesia with the patient or authorized representative who has indicated his/her understanding and acceptance.     Plan Discussed with:   Anesthesia Plan Comments:         Anesthesia Quick Evaluation

## 2017-05-03 NOTE — Op Note (Signed)
[facility] Patient Name: Peter Campbell Procedure Date: 05/03/2017 10:40 AM MRN: [id number] Date of Birth: Sep 15, 1956 Attending MD: Gennette Pac , MD CSN: [id number] Age: 61 Admit Type: Outpatient Procedure:                Colonoscopy Indications:              High risk colon cancer surveillance: Personal                            history of colonic polyps Providers:                Gennette Pac, MD, Nena Polio, RN, Burke Keels, Technician Referring MD:             Alvina Filbert Medicines:                Propofol per Anesthesia Complications:            No immediate complications. Estimated Blood Loss:     Estimated blood loss was minimal. Procedure:                Pre-Anesthesia Assessment:                           - Prior to the procedure, a History and Physical                            was performed, and patient medications and                            allergies were reviewed. The patient's tolerance of                            previous anesthesia was also reviewed. The risks                            and benefits of the procedure and the sedation                            options and risks were discussed with the patient.                            All questions were answered, and informed consent                            was obtained. Prior Anticoagulants: The patient has                            taken no previous anticoagulant or antiplatelet                            agents. ASA Grade Assessment: II - A patient with  mild systemic disease. After reviewing the risks                            and benefits, the patient was deemed in                            satisfactory condition to undergo the procedure.                           After obtaining informed consent, the colonoscope                            was passed under direct vision. Throughout the                            procedure,  the patient's blood pressure, pulse, and                            oxygen saturations were monitored continuously. The                            [id number]) was introduced through the and                            advanced to the the cecum, identified by                            appendiceal orifice and ileocecal valve. The                            colonoscopy was performed without difficulty. The                            patient tolerated the procedure well. The quality                            of the bowel preparation was adequate. The                            ileocecal valve, appendiceal orifice, and rectum                            were photographed. Scope In: 11:05:26 AM Scope Out: 11:21:35 AM Scope Withdrawal Time: 0 hours 12 minutes 9 seconds  Total Procedure Duration: 0 hours 16 minutes 9 seconds  Findings:      The perianal and digital rectal examinations were normal.      Two sessile polyps were found in the rectum and descending colon. The       polyps were 3 to 5 mm in size. These polyps were removed with a cold       snare. Resection and retrieval were complete. Estimated blood loss was       minimal.      Scattered small and large-mouthed diverticula were found in the entire       colon. Surgical anastomosis identified at  approximately 25 cm from the       anal verge.      Internal hemorrhoids and anal papilla were found during retroflexion.      The exam was otherwise without abnormality on direct and retroflexion       views. Impression:               - Two 3 to 5 mm polyps in the rectum and in the                            descending colon, removed with a cold snare.                            Resected and retrieved.                           - Diverticulosis in the entire examined colon.                           - Internal hemorrhoids.                           - The examination was otherwise normal on direct                            and  retroflexion views. Moderate Sedation:      Moderate (conscious) sedation was personally administered by an       anesthesia professional. The following parameters were monitored: oxygen       saturation, heart rate, blood pressure, respiratory rate, EKG, adequacy       of pulmonary ventilation, and response to care. Total physician       intraservice time was 21 minutes. Recommendation:           - Patient has a contact number available for                            emergencies. The signs and symptoms of potential                            delayed complications were discussed with the                            patient. Return to normal activities tomorrow.                            Written discharge instructions were provided to the                            patient.                           - Resume previous diet.                           - Continue present medications.                           -  Repeat colonoscopy date to be determined after                            pending pathology results are reviewed for                            surveillance based on pathology results.                           - Return to GI clinic in 3 months. Procedure Code(s):        --- Professional ---                           [phone], Colonoscopy, flexible; with removal of                            tumor(s), polyp(s), or other lesion(s) by snare                            technique Diagnosis Code(s):        --- Professional ---                           Z86.010, Personal history of colonic polyps                           K62.1, Rectal polyp                           D12.4, Benign neoplasm of descending colon                           K64.8, Other hemorrhoids                           K57.30, Diverticulosis of large intestine without                            perforation or abscess without bleeding CPT copyright 2016 American Medical Association. All rights reserved. The codes documented in  this report are preliminary and upon coder review may  be revised to meet current compliance requirements. Gerrit Friends. Charlina Dwight, MD Gennette Pac, MD 05/03/2017 11:32:47 AM This report has been signed electronically. Number of Addenda: 0

## 2017-05-03 NOTE — Anesthesia Postprocedure Evaluation (Signed)
Anesthesia Post Note  Patient: Peter Campbell  Procedure(s) Performed: Procedure(s) (LRB): COLONOSCOPY WITH PROPOFOL (N/A) POLYPECTOMY  Patient location during evaluation: Short Stay Anesthesia Type: MAC Level of consciousness: awake and alert and oriented Pain management: pain level controlled Vital Signs Assessment: post-procedure vital signs reviewed and stable Respiratory status: spontaneous breathing Cardiovascular status: blood pressure returned to baseline and stable Postop Assessment: no signs of nausea or vomiting Anesthetic complications: no     Last Vitals:  Vitals:   05/03/17 1145 05/03/17 1200  BP: 106/78 111/76  Pulse: (!) 55 (!) 54  Resp: 18 17  Temp:      Last Pain:  Vitals:   05/03/17 1200  TempSrc:   PainSc: 0-No pain                 Graclyn Lawther

## 2017-05-03 NOTE — Transfer of Care (Signed)
Immediate Anesthesia Transfer of Care Note  Patient: Peter Campbell  Procedure(s) Performed: Procedure(s) with comments: COLONOSCOPY WITH PROPOFOL (N/A) - 1015 POLYPECTOMY - descending colon and rectal  Patient Location: PACU  Anesthesia Type:MAC  Level of Consciousness: awake, alert  and oriented  Airway & Oxygen Therapy: Patient Spontanous Breathing and Patient connected to nasal cannula oxygen  Post-op Assessment: Report given to RN, Post -op Vital signs reviewed and stable and Patient moving all extremities X 4  Post vital signs: Reviewed and stable  Last Vitals:  Vitals:   05/03/17 1045 05/03/17 1050  BP:  125/72  Pulse:    Resp: 15 (!) 26  Temp:      Last Pain:  Vitals:   05/03/17 1101  TempSrc:   PainSc: 2       Patients Stated Pain Goal: 4 (16/60/60 0459)  Complications: No apparent anesthesia complications

## 2017-05-03 NOTE — H&P (Addendum)
 @LOGO @   Primary Care Physician:  Alvina FilbertHunter, Denise, MD Primary Gastroenterologist:  Dr. Jena Gaussourk  Pre-Procedure History & Physical: HPI:  Peter Campbell is a 61 y.o. male here for surveillance colonoscopy. History of colonic polyps and history of segmental resection for benign tumor in distant past. Linzess working very well for constipation currently, no lower GI tract symptoms.  Past Medical History:  Diagnosis Date  . Asthma   . Chronic back pain   . Collagen vascular disease (HCC)   . Colonic mass    History, s/p removal; per patient.  . Coronary atherosclerosis of native coronary artery    Dense coronary atherosclerosis by chest CT November 2014   . Essential hypertension, benign   . Essential tremor   . History of chicken pox   . History of [place] measles   . Sciatica     Past Surgical History:  Procedure Laterality Date  . ABDOMINAL SURGERY    . CIRCUMCISION    . COLON SURGERY     for mass removal  . SHOULDER ACROMIOPLASTY Right 06/12/2014   Procedure: RIGHT SHOULDER ACROMIOPLASTY;  Surgeon: Darreld McleanWayne Keeling, MD;  Location: [facility];  Service: Orthopedics;  Laterality: Right;  . SHOULDER OPEN ROTATOR CUFF REPAIR Right 06/12/2014   Procedure: OPEN REPAIR ROTATOR CUFF RIGHT ;  Surgeon: Darreld McleanWayne Keeling, MD;  Location: [facility];  Service: Orthopedics;  Laterality: Right;  . TONSILLECTOMY      Prior to Admission medications   Medication Sig Start Date End Date Taking? Authorizing Provider  albuterol (PROVENTIL HFA;VENTOLIN HFA) 108 (90 Base) MCG/ACT inhaler Inhale 1-2 puffs into the lungs every 6 (six) hours as needed for wheezing or shortness of breath.   Yes [provider]  ALPRAZolam Prudy Feeler(XANAX) 1 MG tablet Take 1 mg by mouth at bedtime as needed for sleep.   Yes [provider]  amLODipine (NORVASC) 5 MG tablet TAKE 1 TABLET BY MOUTH ONCE DAILY. 02/17/17  Yes Jonelle SidleMcDowell, Samuel G, MD  aspirin EC 81 MG tablet Take 1 tablet (81 mg total) by mouth daily. 10/31/13  Yes Jonelle SidleMcDowell,  Samuel G, MD  ENSURE (ENSURE) Take 237 mLs by mouth as needed (nutritional support).   Yes [provider]  gabapentin (NEURONTIN) 400 MG capsule Take 400 mg by mouth 3 (three) times daily.   Yes [provider]  HYDROcodone-acetaminophen (NORCO/VICODIN) 5-325 MG tablet Take 1 tablet by mouth every 6 (six) hours as needed for moderate pain (Must last 30 days.Do not take and drive a car or use machinery.). 04/27/17  Yes Darreld McleanKeeling, Wayne, MD  ipratropium-albuterol (DUONEB) 0.5-2.5 (3) MG/3ML SOLN Take 3 mLs by nebulization every 6 (six) hours as needed. Patient taking differently: Take 3 mLs by nebulization every 6 (six) hours as needed (shortness of breath).  06/19/15  Yes Geoffery Lyonselo, Douglas, MD  linaclotide [facility](LINZESS) 145 MCG CAPS capsule Take 1 capsule (145 mcg total) by mouth daily before breakfast. 03/30/17  Yes Tiffany KocherLewis, Leslie S, PA-C  metoprolol succinate (TOPROL-XL) 25 MG 24 hr tablet TAKE 1 TABLET BY MOUTH TWICE DAILY 02/26/17  Yes Laqueta LindenKoneswaran, Suresh A, MD  naproxen (NAPROSYN) 500 MG tablet TAKE (1) TABLET BY MOUTH TWICE A DAY WITH MEALS (BREAKFAST AND SUPPER) 11/23/16  Yes Darreld McleanKeeling, Wayne, MD  polyethylene glycol-electrolytes (TRILYTE) 420 g solution Take 4,000 mLs by mouth as directed. 03/17/17  Yes Renette Hsu, Gerrit Friendsobert M, MD  temazepam (RESTORIL) 15 MG capsule Take 15 mg by mouth at bedtime. Takes with 30mg  (total 45mg  at bedtime)   Yes [provider]  temazepam (RESTORIL) 30 MG capsule Take 30 mg by mouth at bedtime. takes with 15mg  (total 45mg  at bedtime)   Yes [provider]  nitroGLYCERIN (NITROSTAT) 0.4 MG SL tablet Place 1 tablet (0.4 mg total) under the tongue every 5 (five) minutesas needed for chest pain. 01/31/16   Jonelle Sidle, MD    Allergies as of 03/17/2017  . (No Known Allergies)    Family History  Problem Relation Age of Onset  . COPD Father   . Cancer Father   . Cancer Mother   . Heart attack Brother   . Cancer Sister   . Colon cancer Neg Hx      Social History   Social History  . Marital status: Widowed    Spouse name: N/A  . Number of children: N/A  . Years of education: N/A   Occupational History  . Not on file.   Social History Main Topics  . Smoking status: Current Every Day Smoker    Packs/day: 1.00    Years: 21.00    Types: Cigarettes    Start date: 08/28/1969  . Smokeless tobacco: Never Used  . Alcohol use No  . Drug use: No  . Sexual activity: Yes    Partners: Female   Other Topics Concern  . Not on file   Social History Narrative  . No narrative on file    Review of Systems: See HPI, otherwise negative ROS  Physical Exam: BP 101/67   Pulse 60   Temp 97.9 F (36.6 C) (Oral)   Resp (!) 24   Ht 5\' 10"  (1.778 m)   Wt 161 lb (73 kg)   SpO2 96%   BMI 23.10 kg/m  General:   Alert,  pleasant and cooperative in NAD Neck:  Supple; no masses or thyromegaly. No significant cervical adenopathy. Lungs:  Clear throughout to auscultation.   No wheezes, crackles, or rhonchi. No acute distress. Heart:  Regular rate and rhythm; no murmurs, clicks, rubs,  or gallops. Abdomen: Non-distended, normal bowel sounds.  Soft and nontender without appreciable mass or hepatosplenomegaly.  Pulses:  Normal pulses noted. Extremities:  Without clubbing or edema.  Impression:  Pleasant 61 year old gentleman with a personal history of colonic polyps. Here for surveillance colonoscopy.  Recommendations: I've offered the patient a surveillance colonoscopy today.  The risks, benefits, limitations, alternatives and imponderables have been reviewed with the patient. Questions have been answered. All parties are agreeable.          Notice: This dictation was prepared with Dragon dictation along with smaller phrase technology. Any transcriptional errors that result from this process are unintentional and may not be corrected upon review.

## 2017-05-05 ENCOUNTER — Encounter (HOSPITAL_COMMUNITY): Payer: Self-pay | Admitting: Internal Medicine

## 2017-05-05 ENCOUNTER — Encounter: Payer: Self-pay | Admitting: Internal Medicine

## 2017-05-21 ENCOUNTER — Other Ambulatory Visit: Payer: Self-pay | Admitting: Cardiology

## 2017-05-28 ENCOUNTER — Telehealth: Payer: Self-pay | Admitting: Orthopaedic Surgery

## 2017-05-28 ENCOUNTER — Other Ambulatory Visit: Payer: Self-pay | Admitting: Cardiovascular Disease

## 2017-06-01 ENCOUNTER — Telehealth: Payer: Self-pay | Admitting: Orthopaedic Surgery

## 2017-06-01 MED ORDER — HYDROCODONE-ACETAMINOPHEN 5-325 MG PO TABS: 1.0000 | ORAL_TABLET | Freq: Four times a day (QID) | ORAL | 0 refills | Status: DC | PRN

## 2017-06-01 NOTE — Telephone Encounter (Signed)
Patient requests refill on Hydrocodone/Acetaminophen  5-325  Mgs,  Qty  80       Sig: Take 1 tablet by mouth every 6 (six) hours as needed for moderate pain (Must last 30 days.Do not take and drive a car or use machinery.).

## 2017-06-07 ENCOUNTER — Telehealth: Payer: Self-pay | Admitting: Internal Medicine

## 2017-06-07 MED ORDER — LINACLOTIDE 72 MCG PO CAPS: 72.0000 ug | ORAL_CAPSULE | Freq: Every day | ORAL | 3 refills | Status: DC

## 2017-06-07 NOTE — Telephone Encounter (Signed)
Routing to EG 

## 2017-06-07 NOTE — Telephone Encounter (Signed)
[phone]  Patient would like linzess sent in at the lower dose.  Thinks he needs to lower it because he is going too much.  [place]

## 2017-06-07 NOTE — Addendum Note (Signed)
Addended by: Delane GingerGILL, ERIC A on: 06/07/2017 12:49 PM   Modules accepted: Orders

## 2017-06-07 NOTE — Telephone Encounter (Signed)
Pt is aware.  

## 2017-06-07 NOTE — Telephone Encounter (Signed)
Please tell the patient I sent in the new dosage to his pharmacy as he requested.

## 2017-06-08 ENCOUNTER — Encounter: Payer: Self-pay | Admitting: Orthopaedic Surgery

## 2017-06-08 ENCOUNTER — Ambulatory Visit (INDEPENDENT_AMBULATORY_CARE_PROVIDER_SITE_OTHER): Payer: Medicare Other | Admitting: Orthopaedic Surgery

## 2017-06-08 VITALS — BP 108/71 | HR 70 | Temp 98.9°F | Ht 70.0 in | Wt 162.0 lb

## 2017-06-08 DIAGNOSIS — G8929 Other chronic pain: Secondary | ICD-10-CM

## 2017-06-08 DIAGNOSIS — M25511 Pain in right shoulder: Secondary | ICD-10-CM

## 2017-06-08 NOTE — Progress Notes (Signed)
PROCEDURE NOTE:  The patient request injection, verbal consent was obtained.  The right shoulder was prepped appropriately after time out was performed.   Sterile technique was observed and injection of 1 cc of Depo-Medrol 40 mg with several cc's of plain xylocaine. Anesthesia was provided by ethyl chloride and a 20-gauge needle was used to inject the shoulder area. A posterior approach was used.  The injection was tolerated well.  A band aid dressing was applied.  The patient was advised to apply ice later today and tomorrow to the injection sight as needed.  Encounter Diagnosis  Name Primary?  . Chronic right shoulder pain Yes   Return in three months.  Call if any problem.  Precautions discussed.    Electronically Signed Darreld McleanWayne Rio Taber, MD 9/11/20183:53 PM

## 2017-06-08 NOTE — Patient Instructions (Signed)
Steps to Quit Smoking Smoking tobacco can be bad for your health. It can also affect almost every organ in your body. Smoking puts you and people around you at risk for many serious long-lasting (chronic) diseases. Quitting smoking is hard, but it is one of the best things that you can do for your health. It is never too late to quit. What are the benefits of quitting smoking? When you quit smoking, you lower your risk for getting serious diseases and conditions. They can include:  Lung cancer or lung disease.  Heart disease.  Stroke.  Heart attack.  Not being able to have children (infertility).  Weak bones (osteoporosis) and broken bones (fractures).  If you have coughing, wheezing, and shortness of breath, those symptoms may get better when you quit. You may also get sick less often. If you are pregnant, quitting smoking can help to lower your chances of having a baby of low birth weight. What can I do to help me quit smoking? Talk with your doctor about what can help you quit smoking. Some things you can do (strategies) include:  Quitting smoking totally, instead of slowly cutting back how much you smoke over a period of time.  Going to in-person counseling. You are more likely to quit if you go to many counseling sessions.  Using resources and support systems, such as: ? Online chats with a counselor. ? Phone quitlines. ? Printed self-help materials. ? Support groups or group counseling. ? Text messaging programs. ? Mobile phone apps or applications.  Taking medicines. Some of these medicines may have nicotine in them. If you are pregnant or breastfeeding, do not take any medicines to quit smoking unless your doctor says it is okay. Talk with your doctor about counseling or other things that can help you.  Talk with your doctor about using more than one strategy at the same time, such as taking medicines while you are also going to in-person counseling. This can help make  quitting easier. What things can I do to make it easier to quit? Quitting smoking might feel very hard at first, but there is a lot that you can do to make it easier. Take these steps:  Talk to your family and friends. Ask them to support and encourage you.  Call phone quitlines, reach out to support groups, or work with a counselor.  Ask people who smoke to not smoke around you.  Avoid places that make you want (trigger) to smoke, such as: ? Bars. ? Parties. ? Smoke-break areas at work.  Spend time with people who do not smoke.  Lower the stress in your life. Stress can make you want to smoke. Try these things to help your stress: ? Getting regular exercise. ? Deep-breathing exercises. ? Yoga. ? Meditating. ? Doing a body scan. To do this, close your eyes, focus on one area of your body at a time from head to toe, and notice which parts of your body are tense. Try to relax the muscles in those areas.  Download or buy apps on your mobile phone or tablet that can help you stick to your quit plan. There are many free apps, such as QuitGuide from the CDC (Centers for Disease Control and Prevention). You can find more support from smokefree.gov and other websites.  This information is not intended to replace advice given to you by your health care provider. Make sure you discuss any questions you have with your health care provider. Document Released: 07/11/2009 Document   Revised: 05/12/2016 Document Reviewed: 01/29/2015 Elsevier Interactive Patient Education  2018 Elsevier Inc.  

## 2017-06-17 ENCOUNTER — Ambulatory Visit: Payer: Medicare Other | Admitting: Orthopaedic Surgery

## 2017-06-17 ENCOUNTER — Ambulatory Visit: Payer: Medicare Other | Admitting: Nurse Practitioner

## 2017-06-29 NOTE — Progress Notes (Signed)
Cardiology Office Note  Date: 06/30/2017   ID: [name], DOB 1956/03/29, MRN [id number]  PCP: Alvina Filbert, MD  Primary Cardiologist: Nona Dell, MD   Chief Complaint  Patient presents with  . Coronary Artery Disease    History of Present Illness: Peter Campbell is a 61 y.o. male last seen in April. He presents for a routine follow-up visit. Since last encounter he reports one episode of angina for which he needed to use nitroglycerin, occurred in the morning after working overnight. He otherwise does not report any regular exertional chest pain and has NYHA class II dyspnea. No palpitations or syncope.  He reports compliance with his medications as outlined below. Current regimen includes aspirin, Norvasc, Toprol-XL, and as needed.glycerin. He has not been on statin therapy, LDL was 26 in 2016.  Last ischemic testing was in 2015 as indicated below. We discussed obtaining a follow-up study to reevaluate ischemic burden.  I personally reviewed his ECG today which shows sinus rhythm.  Past Medical History:  Diagnosis Date  . Asthma   . Chronic back pain   . Collagen vascular disease (HCC)   . Colonic mass    History, s/p removal; per patient.  . Coronary atherosclerosis of native coronary artery    Dense coronary atherosclerosis by chest CT November 2014   . Essential hypertension, benign   . Essential tremor   . History of chicken pox   . History of [place] measles   . Sciatica     Past Surgical History:  Procedure Laterality Date  . ABDOMINAL SURGERY    . CIRCUMCISION    . COLON SURGERY     for mass removal  . COLONOSCOPY WITH PROPOFOL N/A 05/03/2017   Procedure: COLONOSCOPY WITH PROPOFOL;  Surgeon: Corbin Ade, MD;  Location: [facility];  Service: Endoscopy;  Laterality: N/A;  1015  . POLYPECTOMY  05/03/2017   Procedure: POLYPECTOMY;  Surgeon: Corbin Ade, MD;  Location: [facility];  Service: Endoscopy;;  descending colon and rectal  . SHOULDER  ACROMIOPLASTY Right 06/12/2014   Procedure: RIGHT SHOULDER ACROMIOPLASTY;  Surgeon: Darreld Mclean, MD;  Location: [facility];  Service: Orthopedics;  Laterality: Right;  . SHOULDER OPEN ROTATOR CUFF REPAIR Right 06/12/2014   Procedure: OPEN REPAIR ROTATOR CUFF RIGHT ;  Surgeon: Darreld Mclean, MD;  Location: [facility];  Service: Orthopedics;  Laterality: Right;  . TONSILLECTOMY      Current Outpatient Prescriptions  Medication Sig Dispense Refill  . albuterol (PROVENTIL HFA;VENTOLIN HFA) 108 (90 Base) MCG/ACT inhaler Inhale 1-2 puffs into the lungs every 6 (six) hours as needed for wheezing or shortness of breath.    Marland Kitchen amLODipine (NORVASC) 5 MG tablet TAKE 1 TABLET BY MOUTH ONCE DAILY. 90 tablet 0  . aspirin EC 81 MG tablet Take 1 tablet (81 mg total) by mouth daily. 90 tablet 3  . gabapentin (NEURONTIN) 400 MG capsule Take 400 mg by mouth 3 (three) times daily.    Marland Kitchen HYDROcodone-acetaminophen (NORCO/VICODIN) 5-325 MG tablet Take 1 tablet by mouth every 6 (six) hours as needed for moderate pain (Must last 30 days.Do not take and drive a car or use machinery.). 75 tablet 0  . ipratropium-albuterol (DUONEB) 0.5-2.5 (3) MG/3ML SOLN Take 3 mLs by nebulization every 6 (six) hours as needed. (Patient taking differently: Take 3 mLs by nebulization every 6 (six) hours as needed (shortness of breath). ) 360 mL 0  . linaclotide (LINZESS) 72 MCG capsule Take 1 capsule (72  mcg total) by mouth daily before breakfast. 30 capsule 3  . metoprolol succinate (TOPROL-XL) 25 MG 24 hr tablet TAKE 1 TABLET BY MOUTH TWICE DAILY 60 tablet 0  . naproxen (NAPROSYN) 500 MG tablet TAKE (1) TABLET BY MOUTH TWICE A DAY WITH MEALS (BREAKFAST AND SUPPER) 60 tablet 5  . nitroGLYCERIN (NITROSTAT) 0.4 MG SL tablet Place 1 tablet (0.4 mg total) under the tongue every 5 (five) minutesas needed for chest pain. 25 tablet 3  . temazepam (RESTORIL) 15 MG capsule Take 15 mg by mouth at bedtime. Takes with  (total  at bedtime)    .  temazepam (RESTORIL) 30 MG capsule Take 30 mg by mouth at bedtime. takes with  (total  at bedtime)     No current facility-administered medications for this visit.    Allergies:  Patient has no known allergies.   Social History: The patient  reports that he has been smoking Cigarettes.  He started smoking about 47 years ago. He has a 21.00 pack-year smoking history. He has never used smokeless tobacco. He reports that he does not drink alcohol or use drugs.   ROS:  Please see the history of present illness. Otherwise, complete review of systems is positive for arthritic pains.  All other systems are reviewed and negative.   Physical Exam: VS:  BP 126/72   Pulse 76   Ht  (1.727 m)   Wt 164 lb (74.4 kg)   SpO2 96%   BMI 24.94 kg/m , BMI Body mass index is 24.94 kg/m.  Wt Readings from Last 3 Encounters:  06/30/17 164 lb (74.4 kg)  06/08/17 162 lb (73.5 kg)  05/03/17 161 lb (73 kg)    General: Patient appears comfortable at rest. HEENT: Conjunctiva and lids normal, oropharynx clear. Neck: Supple, no elevated JVP or carotid bruits, no thyromegaly. Lungs: Clear to auscultation, nonlabored breathing at rest. Cardiac: Regular rate and rhythm, no S3, soft systolic murmur, no pericardial rub. Abdomen: Soft, nontender, bowel sounds present, no guarding or rebound. Extremities: No pitting edema, distal pulses 2+. Skin: Warm and dry. Musculoskeletal: No kyphosis. Neuropsychiatric: Alert and oriented x3, affect grossly appropriate.  ECG: I personally reviewed the tracing from 07/14/2016 which showed normal sinus rhythm.  Recent Labwork: 04/27/2017: BUN 31; Creatinine, Ser 0.83; Hemoglobin 16.4; Platelets 230; Potassium 4.8; Sodium 137     Component Value Date/Time   CHOL 100 12/03/2014 0735   TRIG 279 (H) 12/03/2014 0735   HDL 18 (L) 12/03/2014 0735   CHOLHDL 5.6 12/03/2014 0735   VLDL 56 (H) 12/03/2014 0735   LDLCALC 26 12/03/2014 0735    Other Studies Reviewed  Today:  Lexiscan Cardiolite 10/03/2013: IMPRESSION: 1.  Normal Lexiscan Cardiolite stress test.  2.  No evidence of ischemia or scar.  3.  Soft tissue attenuation artifact noted.  4.  Normal left ventricular systolic function, calculated LVEF 63%.  Assessment and Plan:  1. CAD based on previous chest CT imaging. He has had some angina symptoms on medical therapy. Plan is to obtain a Lexiscan Myoview to reevaluate ischemic burden as compared to previous evaluation in 2015.  2. Long-standing tobacco abuse. We have discussed smoking cessation, he has not been able to quit.  3. Essential hypertension, blood pressure well controlled today.  4. Follow-up lipid evaluation. Currently not on statin therapy with previous LDL 26. He has not had interval lab work with PCP.  Current medicines were reviewed with the patient today.   Orders Placed This Encounter  Procedures  .  NM Myocar Multi W/Spect W/Wall Motion / EF  . EKG 12-Lead    Disposition: Follow-up in 6 months.  Signed, Jonelle Sidle, MD, [facility] 06/30/2017 4:03 PM    [facility] [address] [place], [place] [address] Phone: [phone]; Fax: [phone]

## 2017-06-30 ENCOUNTER — Encounter: Payer: Self-pay | Admitting: Cardiology

## 2017-06-30 ENCOUNTER — Ambulatory Visit (INDEPENDENT_AMBULATORY_CARE_PROVIDER_SITE_OTHER): Payer: Medicare Other | Admitting: Cardiology

## 2017-06-30 VITALS — BP 126/72 | HR 76 | Ht 68.0 in | Wt 164.0 lb

## 2017-06-30 DIAGNOSIS — Z720 Tobacco use: Secondary | ICD-10-CM

## 2017-06-30 DIAGNOSIS — I25119 Atherosclerotic heart disease of native coronary artery with unspecified angina pectoris: Secondary | ICD-10-CM

## 2017-06-30 DIAGNOSIS — I10 Essential (primary) hypertension: Secondary | ICD-10-CM | POA: Diagnosis not present

## 2017-06-30 DIAGNOSIS — I2510 Atherosclerotic heart disease of native coronary artery without angina pectoris: Secondary | ICD-10-CM | POA: Diagnosis not present

## 2017-06-30 DIAGNOSIS — I209 Angina pectoris, unspecified: Secondary | ICD-10-CM | POA: Diagnosis not present

## 2017-06-30 NOTE — Addendum Note (Signed)
Addended by: Marlyn Corporal A on: 06/30/2017 04:06 PM   Modules accepted: Orders

## 2017-06-30 NOTE — Patient Instructions (Signed)
Your physician wants you to follow-up in:  6 months with Dr.McDowell You will receive a reminder letter in the mail two months in advance. If you don't receive a letter, please call our office to schedule the follow-up appointment.  Your physician has requested that you have a lexiscan myoview. For further information please visit [email or web address] Please follow instruction sheet, as given.     Your physician recommends that you continue on your current medications as directed. Please refer to the Current Medication list given to you today.    If you need a refill on your cardiac medications before your next appointment, please call your pharmacy.   Get FASTING lipid lab work      Thank you for [facility] !

## 2017-07-06 ENCOUNTER — Telehealth: Payer: Self-pay | Admitting: Orthopaedic Surgery

## 2017-07-06 MED ORDER — HYDROCODONE-ACETAMINOPHEN 5-325 MG PO TABS: 1.0000 | ORAL_TABLET | Freq: Four times a day (QID) | ORAL | 0 refills | Status: DC | PRN

## 2017-07-06 NOTE — Telephone Encounter (Signed)
Hydrocodone-Acetaminophen 5/325 mg Qty 75 Tablets °

## 2017-07-08 ENCOUNTER — Other Ambulatory Visit (HOSPITAL_COMMUNITY)
Admission: RE | Admit: 2017-07-08 | Discharge: 2017-07-08 | Disposition: A | Discharge status: Home / Self Care | Payer: Medicare Other | Source: Ambulatory Visit | Attending: Cardiology | Admitting: Cardiology

## 2017-07-08 ENCOUNTER — Encounter (HOSPITAL_COMMUNITY): Payer: Self-pay

## 2017-07-08 ENCOUNTER — Encounter (HOSPITAL_BASED_OUTPATIENT_CLINIC_OR_DEPARTMENT_OTHER)
Admission: RE | Admit: 2017-07-08 | Discharge: 2017-07-08 | Disposition: A | Discharge status: Home / Self Care | Payer: Medicare Other | Source: Ambulatory Visit | Attending: Cardiology | Admitting: Cardiology

## 2017-07-08 ENCOUNTER — Encounter (HOSPITAL_COMMUNITY)
Admission: RE | Admit: 2017-07-08 | Discharge: 2017-07-08 | Disposition: A | Discharge status: Home / Self Care | Payer: Medicare Other | Source: Ambulatory Visit | Attending: Cardiology | Admitting: Cardiology

## 2017-07-08 DIAGNOSIS — I10 Essential (primary) hypertension: Secondary | ICD-10-CM | POA: Insufficient documentation

## 2017-07-08 DIAGNOSIS — I25119 Atherosclerotic heart disease of native coronary artery with unspecified angina pectoris: Secondary | ICD-10-CM | POA: Insufficient documentation

## 2017-07-08 LAB — LIPID PANEL
CHOL/HDL RATIO: 3.7 ratio
Cholesterol: 122 mg/dL (ref 0–200)
HDL: 33 mg/dL — AB (ref >40)
LDL CALC: 68 mg/dL (ref 0–99)
Triglycerides: 104 mg/dL (ref <150)
VLDL: 21 mg/dL (ref 0–40)

## 2017-07-08 MED ORDER — SODIUM CHLORIDE 0.9% FLUSH
INTRAVENOUS | Status: AC
  Administered 2017-07-08: 10 mL via INTRAVENOUS
  Filled 2017-07-08: qty 10

## 2017-07-08 MED ORDER — TECHNETIUM TC 99M TETROFOSMIN IV KIT
10.0000 | PACK | Freq: Once | INTRAVENOUS | Status: AC | PRN
  Administered 2017-07-08: 10.6 via INTRAVENOUS

## 2017-07-08 MED ORDER — REGADENOSON 0.4 MG/5ML IV SOLN
INTRAVENOUS | Status: AC
  Administered 2017-07-08: 0.4 mg via INTRAVENOUS
  Filled 2017-07-08: qty 5

## 2017-07-08 MED ORDER — TECHNETIUM TC 99M TETROFOSMIN IV KIT
30.0000 | PACK | Freq: Once | INTRAVENOUS | Status: AC | PRN
  Administered 2017-07-08: 32 via INTRAVENOUS

## 2017-07-09 LAB — NM MYOCAR MULTI W/SPECT W/WALL MOTION / EF
CHL CUP NUCLEAR SDS: 1
CHL CUP RESTING HR STRESS: 51 {beats}/min
LHR: 0.35
LV dias vol: 114 mL (ref 62–150)
LVSYSVOL: 45 mL
Peak HR: 102 {beats}/min
SRS: 4
SSS: 5
TID: 1.13

## 2017-07-26 ENCOUNTER — Other Ambulatory Visit: Payer: Self-pay | Admitting: Cardiovascular Disease

## 2017-08-04 ENCOUNTER — Ambulatory Visit (INDEPENDENT_AMBULATORY_CARE_PROVIDER_SITE_OTHER): Payer: Medicare Other | Admitting: Nurse Practitioner

## 2017-08-04 ENCOUNTER — Encounter: Payer: Self-pay | Admitting: Nurse Practitioner

## 2017-08-04 VITALS — BP 124/84 | HR 65 | Temp 98.4°F | Ht 70.0 in | Wt 160.2 lb

## 2017-08-04 DIAGNOSIS — R1084 Generalized abdominal pain: Secondary | ICD-10-CM

## 2017-08-04 DIAGNOSIS — K5900 Constipation, unspecified: Secondary | ICD-10-CM

## 2017-08-04 DIAGNOSIS — I2510 Atherosclerotic heart disease of native coronary artery without angina pectoris: Secondary | ICD-10-CM | POA: Diagnosis not present

## 2017-08-04 DIAGNOSIS — Z86010 Personal history of colonic polyps: Secondary | ICD-10-CM | POA: Diagnosis not present

## 2017-08-04 DIAGNOSIS — R109 Unspecified abdominal pain: Secondary | ICD-10-CM | POA: Insufficient documentation

## 2017-08-04 NOTE — Patient Instructions (Signed)
1. Continue taking your current medications, specifically Linzess. 2. Return for follow-up in 1 year. 3. Call us if you have any questions or concerns before then.

## 2017-08-04 NOTE — Assessment & Plan Note (Signed)
Symptoms resolved on Linzess 72 mcg.  Recommend follow-up in 1 year.  He can call us before then if he has any questions, concerns, or recurrent/worsening symptoms.

## 2017-08-04 NOTE — Progress Notes (Signed)
cc'ed to pcp °

## 2017-08-04 NOTE — Assessment & Plan Note (Signed)
History of colon polyps and history of tumor resection.  Colonoscopy found polyps that were essentially benign/hyperplastic.  Recommended 5-year repeat colonoscopy because of his history.  He has not had any red flag/warning signs or symptoms that are concerning given his history.  Recommend he continue to monitor for any new onset symptoms and notify us if they occur.  Otherwise, follow-up in 1 year.

## 2017-08-04 NOTE — Progress Notes (Signed)
Referring Provider: Alvina FilbertHunter, Denise, MD Primary Care Physician:  Alvina FilbertHunter, Denise, MD Primary GI:  Dr. Jena Gaussourk  Chief Complaint  Patient presents with  . Constipation    better with Linzess    HPI:   Peter Campbell is a 61 y.o. male who presents for follow-up on constipation.  The patient was last seen in our office 03/17/2017 for the same as well as history of colon polyps.  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  Noted chronic constipation.  We were unsuccessful in obtaining [place] from [place] well as [place] for [place] last colon[place] as both facilities provided statement of "no records found."His last visit he stated he was doing much better than he had a long time, constipation significantly improved on Linzess and improvement in years long history of abdominal pain.  He would like to try 145 mcg dose.  Last colonoscopy is 4+ years and recommended a repeat in 2 years, per the patient, due to polyps and history of tumor no other GI symptoms.  I gave him samples of Linzess 145 mcg, recommended colonoscopy and follow-up in 3 months.  He called about 1 month later and requested a prescription for Linzess 145 is a were working well.  Colonoscopy was completed 05/03/2017 and found two 3-5 mm polyps in the rectum and descending colon, diverticulosis, internal hemorrhoids.  Surgical pathology found the polyps to be benign colonic mucosa and polypoid fragment of mucosa with hyperplastic changes.  Recommended repeat exam in 5 years given pertinent history.  Today he states he's doing well. He is significantly better. Abdominal pain has resolved. Constipation has resolved. Has a bowel movement daily normal to soft/loose but he is satisfied with this result. No incomplete emptying. Appetite has improved since as well. Denies hematochezia, melena, fever, chills, unintentional weight loss.  Denies chest pain, dyspnea, dizziness, lightheadedness, syncope, near syncope. Denies any other upper or lower GI symptoms.  NOTE: Patient PMH and PSH incomplete in this note due to computer issues. See history tab for complete information. History tab information was reviewed with the patient and found to be correct.  Past Medical History:  Diagnosis Date  . Asthma   . Chronic back pain   . Collagen vascular disease (HCC)   . Colonic mass    History, s/p removal; per patient.  . Coronary atherosclerosis of native coronary artery    Dense coronary atherosclerosis by chest CT November 2014   . Essential hypertension, benign   . Essential tremor   . History of chicken pox   . History of [place] measles   . Sciatica     Past Surgical History:  Procedure Laterality Date  . ABDOMINAL SURGERY    . CIRCUMCISION    . COLON SURGERY     for mass removal  . TONSILLECTOMY      Current Outpatient Medications  Medication Sig Dispense Refill  . albuterol (PROVENTIL HFA;VENTOLIN HFA) 108 (90 Base) MCG/ACT inhaler Inhale 1-2 puffs into the lungs every 6 (six) hours as needed for wheezing or shortness of breath.    Marland Kitchen. amLODipine (NORVASC) 5 MG tablet TAKE 1 TABLET BY MOUTH ONCE DAILY. 90 tablet 0  . aspirin EC 81 MG tablet Take 1 tablet (81 mg total) by mouth daily. 90 tablet 3  . gabapentin (NEURONTIN) 400 MG capsule Take 400 mg by mouth 3 (three) times daily.    .Marland Kitchen  HYDROcodone-acetaminophen (NORCO/VICODIN) 5-325 MG tablet Take 1 tablet by mouth every 6 (six) hours as needed for moderate pain (Must last 30 days.Do not take and drive a car or use machinery.). 75 tablet 0  . ipratropium-albuterol (DUONEB) 0.5-2.5 (3) MG/3ML SOLN Take 3 mLs by nebulization every 6 (six) hours as needed. (Patient taking differently: Take 3 mLs by nebulization every 6 (six) hours as needed (shortness of breath). ) 360 mL 0  . linaclotide (LINZESS) 72 MCG capsule Take 1 capsule (72 mcg total) by mouth daily before  breakfast. 30 capsule 3  . metoprolol succinate (TOPROL-XL) 25 MG 24 hr tablet TAKE 1 TABLET BY MOUTH TWICE DAILY 60 tablet 0  . naproxen (NAPROSYN) 500 MG tablet TAKE (1) TABLET BY MOUTH TWICE A DAY WITH MEALS (BREAKFAST AND SUPPER) 60 tablet 5  . nitroGLYCERIN (NITROSTAT) 0.4 MG SL tablet Place 1 tablet (0.4 mg total) under the tongue every 5 (five) minutesas needed for chest pain. 25 tablet 3  . temazepam (RESTORIL) 15 MG capsule Take 15 mg by mouth at bedtime. Takes with 30mg  (total 45mg  at bedtime)    . temazepam (RESTORIL) 30 MG capsule Take 30 mg by mouth at bedtime. takes with 15mg  (total 45mg  at bedtime)     No current facility-administered medications for this visit.     Allergies as of 08/04/2017  . (No Known Allergies)    Family History  Problem Relation Age of Onset  . COPD Father   . Cancer Father   . Cancer Mother   . Heart attack Brother   . Epilepsy Sister   . Hypertension Sister   . Colon cancer Neg Hx     Social History   Socioeconomic History  . Marital status: Widowed    Spouse name: None  . Number of children: None  . Years of education: None  . Highest education level: None  Social Needs  . Financial resource strain: None  . Food insecurity - worry: None  . Food insecurity - inability: None  . Transportation needs - medical: None  . Transportation needs - non-medical: None  Occupational History  . None  Tobacco Use  . Smoking status: Current Every Day Smoker    Packs/day: 1.00    Years: 21.00    Pack years: 21.00    Types: Cigarettes    Start date: 08/28/1969  . Smokeless tobacco: Never Used  Substance and Sexual Activity  . Alcohol use: No    Alcohol/week: 0.0 oz  . Drug use: No  . Sexual activity: Yes    Partners: Female  Other Topics Concern  . None  Social History Narrative  . None    Review of Systems: General: Negative for anorexia, weight loss, fever, chills, fatigue, weakness. ENT: Negative for hoarseness, difficulty  swallowing. CV: Negative for chest pain, angina, palpitations, peripheral edema.  Respiratory: Negative for dyspnea at rest, cough, sputum, wheezing.  GI: See history of present illness. Endo: Negative for unusual weight change.  Heme: Negative for bruising or bleeding. Allergy: Negative for rash or hives.   Physical Exam: BP 124/84   Pulse 65   Temp 98.4 F (36.9 C) (Oral)   Ht 5\' 10"  (1.778 m)   Wt 160 lb 3.2 oz (72.7 kg)   BMI 22.99 kg/m  General:   Alert and oriented. Pleasant and cooperative. Well-nourished and well-developed.  Eyes:  Without icterus, sclera clear and conjunctiva pink.  Ears:  Normal auditory acuity. Cardiovascular:  S1, S2 present without murmurs appreciated. Extremities  without clubbing or edema. Respiratory:  Clear to auscultation bilaterally. No wheezes, rales, or rhonchi. No distress.  Gastrointestinal:  +BS, soft, non-tender and non-distended. No HSM noted. No guarding or rebound. No masses appreciated.  Rectal:  Deferred  Musculoskalatal:  Symmetrical without gross deformities. Normal posture. Neurologic:  Alert and oriented x4;  grossly normal neurologically. Psych:  Alert and cooperative. Normal mood and affect. Heme/Lymph/Immune: No excessive bruising noted.    08/04/2017 2:01 PM   Disclaimer: This note was dictated with voice recognition software. Similar sounding words can inadvertently be transcribed and may not be corrected upon review.

## 2017-08-04 NOTE — Assessment & Plan Note (Signed)
Symptoms essentially resolved on Linzess 72 mcg.  Recommend he continue Linzess, we can provide refills as needed.  Return for follow-up in 1 year.

## 2017-08-10 ENCOUNTER — Telehealth: Payer: Self-pay | Admitting: Orthopaedic Surgery

## 2017-08-10 MED ORDER — HYDROCODONE-ACETAMINOPHEN 5-325 MG PO TABS: 1.0000 | ORAL_TABLET | Freq: Four times a day (QID) | ORAL | 0 refills | Status: DC | PRN

## 2017-08-10 NOTE — Telephone Encounter (Signed)
Hydrocodone-Acetaminophen 5/325 mg Qty 75 Tablets °

## 2017-08-24 ENCOUNTER — Other Ambulatory Visit: Payer: Self-pay | Admitting: Cardiology

## 2017-08-30 ENCOUNTER — Telehealth: Payer: Self-pay | Admitting: Cardiology

## 2017-08-30 NOTE — Telephone Encounter (Signed)
Pt is calling concerning his amLODipine (NORVASC) 5 MG tablet [[id number]][[id number]] recall

## 2017-08-31 ENCOUNTER — Ambulatory Visit (INDEPENDENT_AMBULATORY_CARE_PROVIDER_SITE_OTHER): Payer: Medicare Other | Admitting: Orthopaedic Surgery

## 2017-08-31 ENCOUNTER — Encounter: Payer: Self-pay | Admitting: Orthopaedic Surgery

## 2017-08-31 VITALS — Temp 98.0°F | Ht 70.0 in | Wt 170.0 lb

## 2017-08-31 DIAGNOSIS — M25511 Pain in right shoulder: Secondary | ICD-10-CM | POA: Diagnosis not present

## 2017-08-31 DIAGNOSIS — G8929 Other chronic pain: Secondary | ICD-10-CM | POA: Diagnosis not present

## 2017-08-31 NOTE — Progress Notes (Signed)
PROCEDURE NOTE:  The patient request injection, verbal consent was obtained.  The right shoulder was prepped appropriately after time out was performed.   Sterile technique was observed and injection of 1 cc of Depo-Medrol 40 mg with several cc's of plain xylocaine. Anesthesia was provided by ethyl chloride and a 20-gauge needle was used to inject the shoulder area. A posterior approach was used.  The injection was tolerated well.  A band aid dressing was applied.  The patient was advised to apply ice later today and tomorrow to the injection sight as needed.  I will see him as needed.  Electronically Signed Darreld McleanWayne Eletha Culbertson, MD 12/4/20182:28 PM

## 2017-08-31 NOTE — Patient Instructions (Signed)

## 2017-08-31 NOTE — Telephone Encounter (Signed)
[id number] 

## 2017-08-31 NOTE — Telephone Encounter (Signed)
[facility] to see if his medication is one that is involved

## 2017-09-07 ENCOUNTER — Ambulatory Visit: Payer: Medicare Other | Admitting: Orthopaedic Surgery

## 2017-09-08 ENCOUNTER — Other Ambulatory Visit: Payer: Self-pay | Admitting: Nurse Practitioner

## 2017-09-14 ENCOUNTER — Telehealth: Payer: Self-pay | Admitting: Orthopaedic Surgery

## 2017-09-14 MED ORDER — HYDROCODONE-ACETAMINOPHEN 5-325 MG PO TABS: 1.0000 | ORAL_TABLET | Freq: Four times a day (QID) | ORAL | 0 refills | Status: DC | PRN

## 2017-09-14 NOTE — Telephone Encounter (Signed)
Patient called for refill: (pharmacy is [address], [place]) HYDROcodone-acetaminophen (NORCO/VICODIN) 5-325 MG tablet 75 tablet   - relays had come in last week, 08/31/17, for office visit, and chart notes indicate "see as needed." Please advise regarding medication refill and whether he is to schedule next appointment

## 2017-09-30 ENCOUNTER — Encounter: Payer: Self-pay | Admitting: Orthopaedic Surgery

## 2017-09-30 ENCOUNTER — Ambulatory Visit (INDEPENDENT_AMBULATORY_CARE_PROVIDER_SITE_OTHER): Payer: Medicare Other | Admitting: Orthopaedic Surgery

## 2017-09-30 VITALS — BP 104/68 | HR 69 | Temp 97.4°F | Ht 70.0 in | Wt 170.0 lb

## 2017-09-30 DIAGNOSIS — M25511 Pain in right shoulder: Secondary | ICD-10-CM | POA: Diagnosis not present

## 2017-09-30 DIAGNOSIS — G8929 Other chronic pain: Secondary | ICD-10-CM | POA: Diagnosis not present

## 2017-09-30 NOTE — Patient Instructions (Signed)

## 2017-09-30 NOTE — Progress Notes (Signed)
PROCEDURE NOTE:  The patient request injection, verbal consent was obtained.  The right shoulder was prepped appropriately after time out was performed.   Sterile technique was observed and injection of 1 cc of Depo-Medrol 40 mg with several cc's of plain xylocaine. Anesthesia was provided by ethyl chloride and a 20-gauge needle was used to inject the shoulder area. A posterior approach was used.  The injection was tolerated well.  A band aid dressing was applied.  The patient was advised to apply ice later today and tomorrow to the injection sight as needed.  Return in one month.  Call if any problem.  Precautions discussed.   Electronically Signed Darreld McleanWayne Orlando Thalmann, MD 1/3/20193:09 PM

## 2017-10-04 ENCOUNTER — Telehealth: Payer: Self-pay | Admitting: *Deleted

## 2017-10-04 DIAGNOSIS — K5900 Constipation, unspecified: Secondary | ICD-10-CM

## 2017-10-04 NOTE — Telephone Encounter (Signed)
Patient called in stating he feels the linzezz 70mcg is not as effective as when he first started. He wants to know if this can be increased to see if this works? [facility] is the pharmacy. Please advise Peter Campbell thanks

## 2017-10-05 MED ORDER — LINACLOTIDE 145 MCG PO CAPS: 145.0000 ug | ORAL_CAPSULE | Freq: Every day | ORAL | 2 refills | Status: DC

## 2017-10-05 NOTE — Telephone Encounter (Signed)
We can. I sent in an Rx for 145mcg to his pharmacy with 2 refills. Call us and let us know if it does better.

## 2017-10-06 NOTE — Telephone Encounter (Signed)
Patient called in and made aware of below. Nothing further needed

## 2017-10-06 NOTE — Telephone Encounter (Signed)
[id number]

## 2017-10-19 ENCOUNTER — Telehealth: Payer: Self-pay | Admitting: Orthopaedic Surgery

## 2017-10-19 MED ORDER — HYDROCODONE-ACETAMINOPHEN 5-325 MG PO TABS: 1.0000 | ORAL_TABLET | Freq: Four times a day (QID) | ORAL | 0 refills | Status: DC | PRN

## 2017-10-19 NOTE — Telephone Encounter (Signed)
Patient requests refill:  HYDROcodone-acetaminophen (NORCO/VICODIN) 5-325 MG tablet 75 tablet   - [facility]

## 2017-11-02 ENCOUNTER — Encounter: Payer: Self-pay | Admitting: Orthopaedic Surgery

## 2017-11-02 ENCOUNTER — Ambulatory Visit (INDEPENDENT_AMBULATORY_CARE_PROVIDER_SITE_OTHER): Payer: Medicare Other | Admitting: Orthopaedic Surgery

## 2017-11-02 DIAGNOSIS — M25511 Pain in right shoulder: Secondary | ICD-10-CM | POA: Diagnosis not present

## 2017-11-02 DIAGNOSIS — G8929 Other chronic pain: Secondary | ICD-10-CM | POA: Diagnosis not present

## 2017-11-02 NOTE — Patient Instructions (Signed)
Steps to Quit Smoking Smoking tobacco can be bad for your health. It can also affect almost every organ in your body. Smoking puts you and people around you at risk for many serious long-lasting (chronic) diseases. Quitting smoking is hard, but it is one of the best things that you can do for your health. It is never too late to quit. What are the benefits of quitting smoking? When you quit smoking, you lower your risk for getting serious diseases and conditions. They can include:  Lung cancer or lung disease.  Heart disease.  Stroke.  Heart attack.  Not being able to have children (infertility).  Weak bones (osteoporosis) and broken bones (fractures).  If you have coughing, wheezing, and shortness of breath, those symptoms may get better when you quit. You may also get sick less often. If you are pregnant, quitting smoking can help to lower your chances of having a baby of low birth weight. What can I do to help me quit smoking? Talk with your doctor about what can help you quit smoking. Some things you can do (strategies) include:  Quitting smoking totally, instead of slowly cutting back how much you smoke over a period of time.  Going to in-person counseling. You are more likely to quit if you go to many counseling sessions.  Using resources and support systems, such as: ? Online chats with a counselor. ? Phone quitlines. ? Printed self-help materials. ? Support groups or group counseling. ? Text messaging programs. ? Mobile phone apps or applications.  Taking medicines. Some of these medicines may have nicotine in them. If you are pregnant or breastfeeding, do not take any medicines to quit smoking unless your doctor says it is okay. Talk with your doctor about counseling or other things that can help you.  Talk with your doctor about using more than one strategy at the same time, such as taking medicines while you are also going to in-person counseling. This can help make  quitting easier. What things can I do to make it easier to quit? Quitting smoking might feel very hard at first, but there is a lot that you can do to make it easier. Take these steps:  Talk to your family and friends. Ask them to support and encourage you.  Call phone quitlines, reach out to support groups, or work with a counselor.  Ask people who smoke to not smoke around you.  Avoid places that make you want (trigger) to smoke, such as: ? Bars. ? Parties. ? Smoke-break areas at work.  Spend time with people who do not smoke.  Lower the stress in your life. Stress can make you want to smoke. Try these things to help your stress: ? Getting regular exercise. ? Deep-breathing exercises. ? Yoga. ? Meditating. ? Doing a body scan. To do this, close your eyes, focus on one area of your body at a time from head to toe, and notice which parts of your body are tense. Try to relax the muscles in those areas.  Download or buy apps on your mobile phone or tablet that can help you stick to your quit plan. There are many free apps, such as QuitGuide from the CDC (Centers for Disease Control and Prevention). You can find more support from smokefree.gov and other websites.  This information is not intended to replace advice given to you by your health care provider. Make sure you discuss any questions you have with your health care provider. Document Released: 07/11/2009 Document   Revised: 05/12/2016 Document Reviewed: 01/29/2015 Elsevier Interactive Patient Education  2018 Elsevier Inc.  

## 2017-11-02 NOTE — Progress Notes (Signed)
PROCEDURE NOTE:  The patient request injection, verbal consent was obtained.  The right shoulder was prepped appropriately after time out was performed.   Sterile technique was observed and injection of 1 cc of Depo-Medrol 40 mg with several cc's of plain xylocaine. Anesthesia was provided by ethyl chloride and a 20-gauge needle was used to inject the shoulder area. A posterior approach was used.  The injection was tolerated well.  A band aid dressing was applied.  The patient was advised to apply ice later today and tomorrow to the injection sight as needed.  See prn.  Call if any problem.  Precautions discussed.   Electronically Signed Darreld McleanWayne Alekxander Isola, MD 2/5/20193:23 PM

## 2017-11-12 ENCOUNTER — Other Ambulatory Visit: Payer: Self-pay | Admitting: Cardiology

## 2017-11-22 ENCOUNTER — Telehealth: Payer: Self-pay | Admitting: Orthopaedic Surgery

## 2017-11-22 MED ORDER — HYDROCODONE-ACETAMINOPHEN 5-325 MG PO TABS: 1.0000 | ORAL_TABLET | Freq: Four times a day (QID) | ORAL | 0 refills | Status: DC | PRN

## 2017-11-22 NOTE — Telephone Encounter (Signed)
Patient requests refill on Hydrocodone/Acetaminophen 5-325  Mgs.   Qty  75.  Sig: Take 1 tablet by mouth every 6 (six) hours as needed for moderate pain (Must last 30 days.Do not take and drive a car or use machinery.).  Patient states he uses [facility]

## 2017-11-29 ENCOUNTER — Other Ambulatory Visit: Payer: Self-pay | Admitting: Orthopaedic Surgery

## 2017-12-14 ENCOUNTER — Other Ambulatory Visit: Payer: Self-pay | Admitting: Nurse Practitioner

## 2017-12-14 DIAGNOSIS — K5900 Constipation, unspecified: Secondary | ICD-10-CM

## 2017-12-15 ENCOUNTER — Ambulatory Visit (INDEPENDENT_AMBULATORY_CARE_PROVIDER_SITE_OTHER): Payer: Medicare Other | Admitting: Orthopedic Surgery

## 2017-12-15 VITALS — BP 120/79 | HR 76 | Ht 70.0 in | Wt 170.0 lb

## 2017-12-15 DIAGNOSIS — M25511 Pain in right shoulder: Secondary | ICD-10-CM

## 2017-12-15 DIAGNOSIS — G8929 Other chronic pain: Secondary | ICD-10-CM | POA: Diagnosis not present

## 2017-12-15 MED ORDER — HYDROCODONE-ACETAMINOPHEN 5-325 MG PO TABS: 1.0000 | ORAL_TABLET | Freq: Four times a day (QID) | ORAL | 0 refills | Status: DC | PRN

## 2017-12-15 NOTE — Progress Notes (Signed)
Progress Note   Patient ID: Peter Campbell, male   DOB: 01/11/1956, 62 y.o.   MRN: [id number]  No chief complaint on file.   62 year old male presents for evaluation of right shoulder with prior history of small full-thickness rotator cuff tear on MRI in 2015  Had a type III acromion AC arthritis  IMPRESSION: Small full-thickness tear of the anterior aspect of the distal supraspinatus tendon, less apparent than on the prior study.   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 Small amount of fluid in the subacromial bursa.     Electronically Signed   By: Geanie CooleyJim  Maxwell M.D.   On: 05/23/2014 09:09        No outpatient medications have been marked as taking for the 12/15/17 encounter (Appointment) with Vickki HearingHarrison, Anes Rigel E, MD.    No Known Allergies   BP 120/79   Pulse 76   Ht 5\' 10"  (1.778 m)   Wt 170 lb (77.1 kg)   BMI 24.39 kg/m   Physical Exam   Medical decision-making Encounter Diagnosis  Name Primary?  . Chronic right shoulder pain Yes     Procedure note the subacromial injection shoulder RIGHT  Verbal consent was obtained to inject the  RIGHT   Shoulder  Timeout was completed to confirm the injection site is a subacromial space of the  RIGHT  shoulder   Medication used Depo-Medrol 40 mg and lidocaine 1% 3 cc  Anesthesia was provided by ethyl chloride  The injection was performed in the RIGHT  posterior subacromial space. After pinning the skin with alcohol and anesthetized the skin with ethyl chloride the subacromial space was injected using a 20-gauge needle. There were no complications  Sterile dressing was applied.   Meds ordered this encounter  Medications  . HYDROcodone-acetaminophen (NORCO/VICODIN) 5-325 MG tablet    Sig: Take 1 tablet by mouth every 6 (six) hours as needed for moderate pain (Must last 30 days).    Dispense:  75 tablet    Refill:  0   1 month repeat injection   Fuller CanadaStanley  Liliana Dang, MD 12/15/2017 11:19 AM

## 2018-01-03 ENCOUNTER — Telehealth: Payer: Self-pay | Admitting: Orthopaedic Surgery

## 2018-01-03 NOTE — Telephone Encounter (Signed)
Patient called regarding medication which Dr Hilda LiasKeeling had originally prescribed with 5 refills;  naproxen (NAPROSYN) 500 MG tablet 60 tablet     - [facility] Patient aware that while Dr Hilda LiasKeeling is out of clinic, Dr Romeo AppleHarrison is reviewing requests.

## 2018-01-05 ENCOUNTER — Other Ambulatory Visit: Payer: Self-pay | Admitting: Orthopedic Surgery

## 2018-01-05 MED ORDER — NAPROXEN 500 MG PO TABS: ORAL_TABLET | ORAL | 5 refills | Status: DC

## 2018-01-07 ENCOUNTER — Other Ambulatory Visit: Payer: Self-pay | Admitting: Cardiology

## 2018-01-19 ENCOUNTER — Other Ambulatory Visit: Payer: Self-pay | Admitting: Orthopaedic Surgery

## 2018-01-19 DIAGNOSIS — G8929 Other chronic pain: Secondary | ICD-10-CM

## 2018-01-19 DIAGNOSIS — M25511 Pain in right shoulder: Principal | ICD-10-CM

## 2018-01-19 NOTE — Telephone Encounter (Signed)
Patient of Dr. Sanjuan DameKeeling's requests refill on Hydrocodone/Acetaminophen 5-325 mgs.   Qty  75  Sig: Take 1 tablet by mouth every 6 (six) hours as needed for moderate pain (Must last 30 days).          Patient states he uses [facility]

## 2018-01-21 MED ORDER — HYDROCODONE-ACETAMINOPHEN 5-325 MG PO TABS: 1.0000 | ORAL_TABLET | Freq: Four times a day (QID) | ORAL | 0 refills | Status: DC | PRN

## 2018-01-26 ENCOUNTER — Encounter: Payer: Self-pay | Admitting: Orthopedic Surgery

## 2018-01-26 ENCOUNTER — Ambulatory Visit (INDEPENDENT_AMBULATORY_CARE_PROVIDER_SITE_OTHER): Payer: Medicare Other | Admitting: Orthopedic Surgery

## 2018-01-26 VITALS — BP 148/94 | HR 75 | Ht 70.0 in | Wt 166.0 lb

## 2018-01-26 DIAGNOSIS — M25511 Pain in right shoulder: Secondary | ICD-10-CM

## 2018-01-26 DIAGNOSIS — G8929 Other chronic pain: Secondary | ICD-10-CM | POA: Diagnosis not present

## 2018-01-26 NOTE — Progress Notes (Signed)
Chief Complaint  Patient presents with  . Follow-up    Right Shoulder      Procedure note the subacromial injection shoulder RIGHT  Verbal consent was obtained to inject the  RIGHT   Shoulder  Timeout was completed to confirm the injection site is a subacromial space of the  RIGHT  shoulder   Medication used Depo-Medrol 40 mg and lidocaine 1% 3 cc  Anesthesia was provided by ethyl chloride  The injection was performed in the RIGHT  posterior subacromial space. After pinning the skin with alcohol and anesthetized the skin with ethyl chloride the subacromial space was injected using a 20-gauge needle. There were no complications  Sterile dressing was applied.  Encounter Diagnosis  Name Primary?  . Chronic right shoulder pain Yes

## 2018-01-26 NOTE — Patient Instructions (Signed)
Steps to Quit Smoking Smoking tobacco can be bad for your health. It can also affect almost every organ in your body. Smoking puts you and people around you at risk for many serious Peter Campbell-lasting (chronic) diseases. Quitting smoking is hard, but it is one of the best things that you can do for your health. It is never too late to quit. What are the benefits of quitting smoking? When you quit smoking, you lower your risk for getting serious diseases and conditions. They can include:  Lung cancer or lung disease.  Heart disease.  Stroke.  Heart attack.  Not being able to have children (infertility).  Weak bones (osteoporosis) and broken bones (fractures).  If you have coughing, wheezing, and shortness of breath, those symptoms may get better when you quit. You may also get sick less often. If you are pregnant, quitting smoking can help to lower your chances of having a baby of low birth weight. What can I do to help me quit smoking? Talk with your doctor about what can help you quit smoking. Some things you can do (strategies) include:  Quitting smoking totally, instead of slowly cutting back how much you smoke over a period of time.  Going to in-person counseling. You are more likely to quit if you go to many counseling sessions.  Using resources and support systems, such as: ? Online chats with a counselor. ? Phone quitlines. ? Printed self-help materials. ? Support groups or group counseling. ? Text messaging programs. ? Mobile phone apps or applications.  Taking medicines. Some of these medicines may have nicotine in them. If you are pregnant or breastfeeding, do not take any medicines to quit smoking unless your doctor says it is okay. Talk with your doctor about counseling or other things that can help you.  Talk with your doctor about using more than one strategy at the same time, such as taking medicines while you are also going to in-person counseling. This can help make  quitting easier. What things can I do to make it easier to quit? Quitting smoking might feel very hard at first, but there is a lot that you can do to make it easier. Take these steps:  Talk to your family and friends. Ask them to support and encourage you.  Call phone quitlines, reach out to support groups, or work with a counselor.  Ask people who smoke to not smoke around you.  Avoid places that make you want (trigger) to smoke, such as: ? Bars. ? Parties. ? Smoke-break areas at work.  Spend time with people who do not smoke.  Lower the stress in your life. Stress can make you want to smoke. Try these things to help your stress: ? Getting regular exercise. ? Deep-breathing exercises. ? Yoga. ? Meditating. ? Doing a body scan. To do this, close your eyes, focus on one area of your body at a time from head to toe, and notice which parts of your body are tense. Try to relax the muscles in those areas.  Download or buy apps on your mobile phone or tablet that can help you stick to your quit plan. There are many free apps, such as QuitGuide from the CDC (Centers for Disease Control and Prevention). You can find more support from smokefree.gov and other websites.  This information is not intended to replace advice given to you by your health care provider. Make sure you discuss any questions you have with your health care provider. Document Released: 07/11/2009 Document   Revised: 05/12/2016 Document Reviewed: 01/29/2015 Elsevier Interactive Patient Education  2018 Elsevier Inc.  

## 2018-02-01 ENCOUNTER — Other Ambulatory Visit: Payer: Self-pay | Admitting: Cardiology

## 2018-02-17 ENCOUNTER — Other Ambulatory Visit: Payer: Self-pay | Admitting: Orthopedic Surgery

## 2018-02-17 DIAGNOSIS — M25511 Pain in right shoulder: Principal | ICD-10-CM

## 2018-02-17 DIAGNOSIS — G8929 Other chronic pain: Secondary | ICD-10-CM

## 2018-02-17 NOTE — Telephone Encounter (Signed)
Patient of Dr Hilda Lias, have sent request to him.

## 2018-02-18 ENCOUNTER — Other Ambulatory Visit: Payer: Self-pay | Admitting: Nurse Practitioner

## 2018-02-18 DIAGNOSIS — K5900 Constipation, unspecified: Secondary | ICD-10-CM

## 2018-03-01 ENCOUNTER — Encounter: Payer: Self-pay | Admitting: Orthopaedic Surgery

## 2018-03-01 ENCOUNTER — Ambulatory Visit (INDEPENDENT_AMBULATORY_CARE_PROVIDER_SITE_OTHER): Payer: Medicare Other | Admitting: Orthopaedic Surgery

## 2018-03-01 DIAGNOSIS — G8929 Other chronic pain: Secondary | ICD-10-CM

## 2018-03-01 DIAGNOSIS — M25511 Pain in right shoulder: Secondary | ICD-10-CM

## 2018-03-01 DIAGNOSIS — F17210 Nicotine dependence, cigarettes, uncomplicated: Secondary | ICD-10-CM

## 2018-03-01 NOTE — Progress Notes (Signed)
PROCEDURE NOTE:  The patient request injection, verbal consent was obtained.  The right shoulder was prepped appropriately after time out was performed.   Sterile technique was observed and injection of 1 cc of Depo-Medrol 40 mg with several cc's of plain xylocaine. Anesthesia was provided by ethyl chloride and a 20-gauge needle was used to inject the shoulder area. A posterior approach was used.  The injection was tolerated well.  A band aid dressing was applied.  The patient was advised to apply ice later today and tomorrow to the injection sight as needed.  I will see as needed.  Electronically Signed Darreld McleanWayne Uri Covey, MD 6/4/20192:04 PM

## 2018-03-07 ENCOUNTER — Other Ambulatory Visit: Payer: Self-pay | Admitting: Cardiology

## 2018-03-21 ENCOUNTER — Other Ambulatory Visit: Payer: Self-pay | Admitting: Cardiology

## 2018-03-24 ENCOUNTER — Telehealth: Payer: Self-pay | Admitting: Orthopaedic Surgery

## 2018-03-24 DIAGNOSIS — G8929 Other chronic pain: Secondary | ICD-10-CM

## 2018-03-24 DIAGNOSIS — M25511 Pain in right shoulder: Principal | ICD-10-CM

## 2018-03-24 MED ORDER — HYDROCODONE-ACETAMINOPHEN 5-325 MG PO TABS: ORAL_TABLET | ORAL | 0 refills | Status: DC

## 2018-03-24 NOTE — Telephone Encounter (Signed)
Patient called for refill: HYDROcodone-acetaminophen (NORCO/VICODIN) 5-325 MG tablet 75 tablet  - [facility] - patient has appointment scheduled 03/30/18

## 2018-03-28 NOTE — Progress Notes (Signed)
Cardiology Office Note  Date: 03/29/2018   ID: Peter BruceRicky A Oloughlin, DOB 02/23/1956, MRN [id number]  PCP: Alvina FilbertHunter, Denise, MD  Primary Cardiologist: Nona DellSamuel Dugan Vanhoesen, MD   Chief Complaint  Patient presents with  . Coronary Artery Disease    History of Present Illness: Peter Campbell is a 62 y.o. male last seen in October 2018.  He is here for a routine visit.  Continues to do well, does not report any angina symptoms or unusual shortness of breath.  Continues to play music regularly, mainly bluegrass.  In fact he is scheduled [place]  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  I reviewed his medications.  Cardiac regimen includes aspirin, Norvasc, Toprol-XL, and as needed nitroglycerin.  He is not on statin therapy, lipids have actually looked good at baseline and he has not wanted to take this type of medication.  Follow-up Myoview in October 2018 was low risk, no active ischemia and normal LVEF.  Past Medical History:  Diagnosis Date  . Asthma   . Chronic back pain   . Collagen vascular disease (HCC)   . Colonic mass    History, s/p removal; per patient.  . Coronary atherosclerosis of native coronary artery    Dense coronary atherosclerosis by chest CT November 2014   . Essential hypertension, benign   . Essential tremor   . History of chicken pox   . History of [place]   . Sciatica     Past Surgical History:  Procedure Laterality Date  . ABDOMINAL SURGERY    . CIRCUMCISION    . COLON SURGERY     for mass removal  . COLONOSCOPY WITH PROPOFOL N/A 05/03/2017   Procedure: COLONOSCOPY WITH PROPOFOL;  Surgeon: Corbin Adeourk, Robert M, MD;  [facility];  Service: Endoscopy;  Laterality: N/A;  1015  . POLYPECTOMY  05/03/2017   Procedure: POLYPECTOMY;  Surgeon: Corbin Adeourk, Robert M, MD;  [facility];  Service: Endoscopy;;  descending colon and rectal  . SHOULDER ACROMIOPLASTY Right 06/12/2014   Procedure:  RIGHT SHOULDER ACROMIOPLASTY;  Surgeon: Darreld McleanWayne Keeling, MD;  Location: [facility];  Service: Orthopedics;  Laterality: Right;  . SHOULDER OPEN ROTATOR CUFF REPAIR Right 06/12/2014   Procedure: OPEN REPAIR ROTATOR CUFF RIGHT ;  Surgeon: Darreld McleanWayne Keeling, MD;  Location: [facility];  Service: Orthopedics;  Laterality: Right;  . TONSILLECTOMY      Current Outpatient Medications  Medication Sig Dispense Refill  . albuterol (PROVENTIL HFA;VENTOLIN HFA) 108 (90 Base) MCG/ACT inhaler Inhale 1-2 puffs into the lungs every 6 (six) hours as needed for wheezing or shortness of breath.    Marland Kitchen. amLODipine (NORVASC) 5 MG tablet TAKE 1 TABLET BY MOUTH ONCE DAILY. 30 tablet 6  . aspirin EC 81 MG tablet Take 1 tablet (81 mg total) by mouth daily. 90 tablet 3  . gabapentin (NEURONTIN) 400 MG capsule Take 600 mg by mouth 3 (three) times daily.     Marland Kitchen. HYDROcodone-acetaminophen (NORCO/VICODIN) 5-325 MG tablet TAKE (1) TABLET BY MOUTH EVERY SIX HOURS AS NEEDED FOR MODERATE PAIN.(MUST LAST 30 DAYS) 75 tablet 0  . ipratropium-albuterol (DUONEB) 0.5-2.5 (3) MG/3ML SOLN Take 3 mLs by nebulization every 6 (six) hours as needed. (Patient taking differently: Take 3 mLs by nebulization every 6 (six) hours as needed (shortness of breath). ) 360 mL 0  . LINZESS 145 MCG CAPS capsule TAKE (1) CAPSULE BY MOUTH ONCE DAILY BEFORE  BREAKFAST. 30 capsule 3  . metoprolol succinate (TOPROL-XL) 25 MG 24 hr tablet TAKE 1 TABLET BY MOUTH TWICE DAILY 60 tablet 10  . naproxen (NAPROSYN) 500 MG tablet TAKE (1) TABLET BY MOUTH TWICE A DAY WITH MEALS (BREAKFAST AND SUPPER) 60 tablet 5  . nitroGLYCERIN (NITROSTAT) 0.4 MG SL tablet Place 1 tablet (0.4 mg total) under the tongue every 5 (five) minutesas needed for chest pain. 25 tablet 3  . temazepam (RESTORIL) 15 MG capsule Take 15 mg by mouth at bedtime. Takes with 30mg  (total 45mg  at bedtime)    . temazepam (RESTORIL) 30 MG capsule Take 30 mg by mouth at bedtime. takes with 15mg  (total 45mg  at bedtime)     No  current facility-administered medications for this visit.    Allergies:  Patient has no known allergies.   Social History: The patient  reports that he has been smoking cigarettes.  He started smoking about 48 years ago. He has a 21.00 pack-year smoking history. He has never used smokeless tobacco. He reports that he does not drink alcohol or use drugs.   ROS:  Please see the history of present illness. Otherwise, complete review of systems is positive for arthritic shoulder pains.  All other systems are reviewed and negative.   Physical Exam: VS:  BP 118/80 (BP Location: Right Arm)   Pulse 61   Ht 5\' 8"  (1.727 m)   Wt 168 lb (76.2 kg)   SpO2 97%   BMI 25.54 kg/m , BMI Body mass index is 25.54 kg/m.  Wt Readings from Last 3 Encounters:  03/29/18 168 lb (76.2 kg)  01/26/18 166 lb (75.3 kg)  12/15/17 170 lb (77.1 kg)    General: Patient appears comfortable at rest. HEENT: Conjunctiva and lids normal, oropharynx clear. Neck: Supple, no elevated JVP or carotid bruits, no thyromegaly. Lungs: Clear to auscultation, nonlabored breathing at rest. Cardiac: Regular rate and rhythm, no S3, soft systolic murmur. Abdomen: Soft, nontender, bowel sounds present. Extremities: No pitting edema, distal pulses 2+. Skin: Warm and dry. Musculoskeletal: No kyphosis. Neuropsychiatric: Alert and oriented x3, affect grossly appropriate.  ECG: I personally reviewed the tracing from 06/30/2017 which showed sinus rhythm.  Recent Labwork: 04/27/2017: BUN 31; Creatinine, Ser 0.83; Hemoglobin 16.4; Platelets 230; Potassium 4.8; Sodium 137     Component Value Date/Time   CHOL 122 07/08/2017 0826   TRIG 104 07/08/2017 0826   HDL 33 (L) 07/08/2017 0826   CHOLHDL 3.7 07/08/2017 0826   VLDL 21 07/08/2017 0826   LDLCALC 68 07/08/2017 0826    Other Studies Reviewed Today:  Eugenie Birks Myoview 07/08/2017:  The study is normal.  This is a low risk study.  The left ventricular ejection fraction is normal  (55-65%).  There was no ST segment deviation noted during stress.   Diaphragmatic attenuation no ischemia. SDS 1. Normal wall motion EF 60%   Assessment and Plan:  1.  Multivessel coronary artery calcifications by prior chest CT imaging.  He is asymptomatic on medical therapy and had a follow-up Myoview in October 2018 that was negative for ischemia.  Continue with current regimen.  He has declined statin therapy and actually his lipid numbers have been reasonable, last LDL 68.  2.  Tobacco abuse.  Smoking cessation has been discussed over time.  He has not been able to quit.  3.  Essential hypertension, blood pressure is normal today.  Current medicines were reviewed with the patient today.  Disposition: Follow up in 6 months.  Signed, Remi Deter  Franky Macho, MD, [facility] 03/29/2018 10:57 AM    [facility] at [facility] [address], [place], [place] [address] Phone: [phone]; Fax: [phone]

## 2018-03-29 ENCOUNTER — Encounter: Payer: Self-pay | Admitting: Cardiology

## 2018-03-29 ENCOUNTER — Ambulatory Visit (INDEPENDENT_AMBULATORY_CARE_PROVIDER_SITE_OTHER): Payer: Medicare Other | Admitting: Cardiology

## 2018-03-29 VITALS — BP 118/80 | HR 61 | Ht 68.0 in | Wt 168.0 lb

## 2018-03-29 DIAGNOSIS — Z720 Tobacco use: Secondary | ICD-10-CM | POA: Diagnosis not present

## 2018-03-29 DIAGNOSIS — I10 Essential (primary) hypertension: Secondary | ICD-10-CM

## 2018-03-29 DIAGNOSIS — I2510 Atherosclerotic heart disease of native coronary artery without angina pectoris: Secondary | ICD-10-CM | POA: Diagnosis not present

## 2018-03-29 NOTE — Patient Instructions (Signed)
Your physician wants you to follow-up in:6 months with Dr.McDowell You will receive a reminder letter in the mail two months in advance. If you don't receive a letter, please call our office to schedule the follow-up appointment.   Your physician recommends that you continue on your current medications as directed. Please refer to the Current Medication list given to you today.   If you need a refill on your cardiac medications before your next appointment, please call your pharmacy.    No lab work or tests ordered today.      Thank you for choosing [facility] !         

## 2018-03-30 ENCOUNTER — Ambulatory Visit (INDEPENDENT_AMBULATORY_CARE_PROVIDER_SITE_OTHER): Payer: Medicare Other | Admitting: Orthopaedic Surgery

## 2018-03-30 ENCOUNTER — Encounter: Payer: Self-pay | Admitting: Orthopaedic Surgery

## 2018-03-30 DIAGNOSIS — M25511 Pain in right shoulder: Secondary | ICD-10-CM

## 2018-03-30 DIAGNOSIS — G8929 Other chronic pain: Secondary | ICD-10-CM

## 2018-03-30 NOTE — Progress Notes (Signed)
PROCEDURE NOTE:  The patient request injection, verbal consent was obtained.  The right shoulder was prepped appropriately after time out was performed.   Sterile technique was observed and injection of 1 cc of Depo-Medrol 40 mg with several cc's of plain xylocaine. Anesthesia was provided by ethyl chloride and a 20-gauge needle was used to inject the shoulder area. A posterior approach was used.  The injection was tolerated well.  A band aid dressing was applied.  The patient was advised to apply ice later today and tomorrow to the injection sight as needed.  I will see as needed.  Call if any problem.  Precautions discussed.   Electronically Signed Darreld McleanWayne Moet Mikulski, MD 7/3/20198:19 AM

## 2018-04-21 ENCOUNTER — Other Ambulatory Visit: Payer: Self-pay | Admitting: Orthopaedic Surgery

## 2018-04-21 DIAGNOSIS — G8929 Other chronic pain: Secondary | ICD-10-CM

## 2018-04-21 DIAGNOSIS — M25511 Pain in right shoulder: Principal | ICD-10-CM

## 2018-04-26 ENCOUNTER — Encounter: Payer: Self-pay | Admitting: Orthopaedic Surgery

## 2018-04-26 ENCOUNTER — Ambulatory Visit (INDEPENDENT_AMBULATORY_CARE_PROVIDER_SITE_OTHER): Payer: Medicare Other | Admitting: Orthopaedic Surgery

## 2018-04-26 VITALS — BP 120/77 | HR 66 | Ht 68.0 in | Wt 170.0 lb

## 2018-04-26 DIAGNOSIS — M25511 Pain in right shoulder: Secondary | ICD-10-CM | POA: Diagnosis not present

## 2018-04-26 DIAGNOSIS — G8929 Other chronic pain: Secondary | ICD-10-CM | POA: Diagnosis not present

## 2018-04-26 NOTE — Progress Notes (Signed)
PROCEDURE NOTE:  The patient request injection, verbal consent was obtained.  The right shoulder was prepped appropriately after time out was performed.   Sterile technique was observed and injection of 1 cc of Depo-Medrol 40 mg with several cc's of plain xylocaine. Anesthesia was provided by ethyl chloride and a 20-gauge needle was used to inject the shoulder area. A posterior approach was used.  The injection was tolerated well.  A band aid dressing was applied.  The patient was advised to apply ice later today and tomorrow to the injection sight as needed.  I will get a MRI of the right shoulder as he is not improving.  Return after the MRI.  Electronically Signed Darreld McleanWayne Dacia Capers, MD 7/30/20192:04 PM

## 2018-04-29 ENCOUNTER — Ambulatory Visit (HOSPITAL_COMMUNITY)
Admission: RE | Admit: 2018-04-29 | Discharge: 2018-04-29 | Disposition: A | Discharge status: Home / Self Care | Payer: Medicare Other | Source: Ambulatory Visit | Attending: Orthopaedic Surgery | Admitting: Orthopaedic Surgery

## 2018-04-29 DIAGNOSIS — G8929 Other chronic pain: Secondary | ICD-10-CM | POA: Diagnosis present

## 2018-04-29 DIAGNOSIS — Z9889 Other specified postprocedural states: Secondary | ICD-10-CM | POA: Insufficient documentation

## 2018-04-29 DIAGNOSIS — M25511 Pain in right shoulder: Secondary | ICD-10-CM | POA: Insufficient documentation

## 2018-05-05 ENCOUNTER — Encounter: Payer: Self-pay | Admitting: Orthopaedic Surgery

## 2018-05-05 ENCOUNTER — Ambulatory Visit (INDEPENDENT_AMBULATORY_CARE_PROVIDER_SITE_OTHER): Payer: Medicare Other | Admitting: Orthopaedic Surgery

## 2018-05-05 VITALS — BP 112/73 | HR 65 | Ht 68.0 in | Wt 169.0 lb

## 2018-05-05 DIAGNOSIS — G8929 Other chronic pain: Secondary | ICD-10-CM | POA: Diagnosis not present

## 2018-05-05 DIAGNOSIS — M25511 Pain in right shoulder: Secondary | ICD-10-CM

## 2018-05-05 DIAGNOSIS — I2510 Atherosclerotic heart disease of native coronary artery without angina pectoris: Secondary | ICD-10-CM

## 2018-05-05 NOTE — Progress Notes (Signed)
Patient ZO:XWRUE Peter Campbell, male DOB:March 27, 1956, 62 y.o. AVW:[id number]  Chief Complaint  Patient presents with  . Shoulder Pain    right     HPI  Peter Campbell is a 62 y.o. male who has chronic right shoulder pain.  He had a MRI which showed: IMPRESSION: Intact and normal appearing rotator cuff and long head of biceps. The appearance of the shoulder is improved since the prior exam.  Status post acromioplasty and debridement of the [facility] joint without evidence complication.   I have explained the findings to him.  I have recommended OT/PT to the shoulder   Body mass index is 25.7 kg/m.  ROS  Review of Systems  All other systems reviewed and are negative.  Past Medical History:  Diagnosis Date  . Asthma   . Chronic back pain   . Collagen vascular disease (HCC)   . Colonic mass    History, s/p removal; per patient.  . Coronary atherosclerosis of native coronary artery    Dense coronary atherosclerosis by chest CT November 2014   . Essential hypertension, benign   . Essential tremor   . History of chicken pox   . History of [place] measles   . Sciatica     Past Surgical History:  Procedure Laterality Date  . ABDOMINAL SURGERY    . CIRCUMCISION    . COLON SURGERY     for mass removal  . COLONOSCOPY WITH PROPOFOL N/A 05/03/2017   Procedure: COLONOSCOPY WITH PROPOFOL;  Surgeon: Corbin Ade, MD;  [facility];  Service: Endoscopy;  Laterality: N/A;  1015  . POLYPECTOMY  05/03/2017   Procedure: POLYPECTOMY;  Surgeon: Corbin Ade, MD;  [facility];  Service: Endoscopy;;  descending colon and rectal  . SHOULDER ACROMIOPLASTY Right 06/12/2014   Procedure: RIGHT SHOULDER ACROMIOPLASTY;  Surgeon: Darreld Mclean, MD;  Location: [facility];  Service: Orthopedics;  Laterality: Right;  . SHOULDER OPEN ROTATOR CUFF REPAIR Right 06/12/2014   Procedure: OPEN REPAIR ROTATOR CUFF RIGHT ;  Surgeon: Darreld Mclean, MD;  Location: [facility];  Service: Orthopedics;   Laterality: Right;  . TONSILLECTOMY      Family History  Problem Relation Age of Onset  . COPD Father   . Cancer Father   . Cancer Mother   . Heart attack Brother   . Epilepsy Sister   . Hypertension Sister   . Colon cancer Neg Hx     Social History Social History   Tobacco Use  . Smoking status: Current Every Day Smoker    Packs/day: 1.00    Years: 21.00    Pack years: 21.00    Types: Cigarettes    Start date: 08/28/1969  . Smokeless tobacco: Never Used  Substance Use Topics  . Alcohol use: No    Alcohol/week: 0.0 standard drinks  . Drug use: No    Types: Marijuana    No Known Allergies  Current Outpatient Medications  Medication Sig Dispense Refill  . albuterol (PROVENTIL HFA;VENTOLIN HFA) 108 (90 Base) MCG/ACT inhaler Inhale 1-2 puffs into the lungs every 6 (six) hours as needed for wheezing or shortness of breath.    Marland Kitchen amLODipine (NORVASC) 5 MG tablet TAKE 1 TABLET BY MOUTH ONCE DAILY. 30 tablet 6  . aspirin EC 81 MG tablet Take 1 tablet (81 mg total) by mouth daily. 90 tablet 3  . gabapentin (NEURONTIN) 400 MG capsule Take 600 mg by mouth 3 (three) times daily.     Marland Kitchen gabapentin (  NEURONTIN) 600 MG tablet     . HYDROcodone-acetaminophen (NORCO/VICODIN) 5-325 MG tablet TAKE (1) TABLET BY MOUTH EVERY SIX HOURS AS NEEDED FOR MODERATE PAIN.(MUST LAST 30 DAYS) 75 tablet 0  . ipratropium-albuterol (DUONEB) 0.5-2.5 (3) MG/3ML SOLN Take 3 mLs by nebulization every 6 (six) hours as needed. (Patient taking differently: Take 3 mLs by nebulization every 6 (six) hours as needed (shortness of breath). ) 360 mL 0  . LINZESS 145 MCG CAPS capsule TAKE (1) CAPSULE BY MOUTH ONCE DAILY BEFORE BREAKFAST. 30 capsule 3  . metoprolol succinate (TOPROL-XL) 25 MG 24 hr tablet TAKE 1 TABLET BY MOUTH TWICE DAILY 60 tablet 10  . naproxen (NAPROSYN) 500 MG tablet TAKE (1) TABLET BY MOUTH TWICE A DAY WITH MEALS (BREAKFAST AND SUPPER) 60 tablet 5  . nitroGLYCERIN (NITROSTAT) 0.4 MG SL tablet Place 1  tablet (0.4 mg total) under the tongue every 5 (five) minutesas needed for chest pain. 25 tablet 3  . temazepam (RESTORIL) 15 MG capsule Take 15 mg by mouth at bedtime. Takes with 30mg  (total 45mg  at bedtime)    . temazepam (RESTORIL) 30 MG capsule Take 30 mg by mouth at bedtime. takes with 15mg  (total 45mg  at bedtime)     No current facility-administered medications for this visit.      Physical Exam  Blood pressure 112/73, pulse 65, height 5\' 8"  (1.727 m), weight 169 lb (76.7 kg).  Constitutional: overall normal hygiene, normal nutrition, well developed, normal grooming, normal body habitus. Assistive device:none  Musculoskeletal: gait and station Limp none, muscle tone and strength are normal, no tremors or atrophy is present.  .  Neurological: coordination overall normal.  Deep tendon reflex/nerve stretch intact.  Sensation normal.  Cranial nerves II-XII intact.   Skin:   Normal overall no scars, lesions, ulcers or rashes. No psoriasis.  Psychiatric: Alert and oriented x 3.  Recent memory intact, remote memory unclear.  Normal mood and affect. Well groomed.  Good eye contact.  Cardiovascular: overall no swelling, no varicosities, no edema bilaterally, normal temperatures of the legs and arms, no clubbing, cyanosis and good capillary refill.  Lymphatic: palpation is normal.  He has pain in the shoulder on the extremes of motion.  NV intact.  All other systems reviewed and are negative   The patient has been educated about the nature of the problem(s) and counseled on treatment options.  The patient appeared to understand what I have discussed and is in agreement with it.  Encounter Diagnosis  Name Primary?  . Chronic right shoulder pain Yes    PLAN Call if any problems.  Precautions discussed.  Continue current medications.   Return to clinic 3 weeks   Begin PT/OT  Electronically Signed Darreld McleanWayne Arhaan Chesnut, MD 8/8/201910:31 AM

## 2018-05-05 NOTE — Addendum Note (Signed)
Addended by: Baird KayUGLAS, Desire Fulp M on: 05/05/2018 10:38 AM   Modules accepted: Orders

## 2018-05-05 NOTE — Patient Instructions (Signed)
Steps to Quit Smoking Smoking tobacco can be bad for your health. It can also affect almost every organ in your body. Smoking puts you and people around you at risk for many serious long-lasting (chronic) diseases. Quitting smoking is hard, but it is one of the best things that you can do for your health. It is never too late to quit. What are the benefits of quitting smoking? When you quit smoking, you lower your risk for getting serious diseases and conditions. They can include:  Lung cancer or lung disease.  Heart disease.  Stroke.  Heart attack.  Not being able to have children (infertility).  Weak bones (osteoporosis) and broken bones (fractures).  If you have coughing, wheezing, and shortness of breath, those symptoms may get better when you quit. You may also get sick less often. If you are pregnant, quitting smoking can help to lower your chances of having a baby of low birth weight. What can I do to help me quit smoking? Talk with your doctor about what can help you quit smoking. Some things you can do (strategies) include:  Quitting smoking totally, instead of slowly cutting back how much you smoke over a period of time.  Going to in-person counseling. You are more likely to quit if you go to many counseling sessions.  Using resources and support systems, such as: ? Online chats with a counselor. ? Phone quitlines. ? Printed self-help materials. ? Support groups or group counseling. ? Text messaging programs. ? Mobile phone apps or applications.  Taking medicines. Some of these medicines may have nicotine in them. If you are pregnant or breastfeeding, do not take any medicines to quit smoking unless your doctor says it is okay. Talk with your doctor about counseling or other things that can help you.  Talk with your doctor about using more than one strategy at the same time, such as taking medicines while you are also going to in-person counseling. This can help make  quitting easier. What things can I do to make it easier to quit? Quitting smoking might feel very hard at first, but there is a lot that you can do to make it easier. Take these steps:  Talk to your family and friends. Ask them to support and encourage you.  Call phone quitlines, reach out to support groups, or work with a counselor.  Ask people who smoke to not smoke around you.  Avoid places that make you want (trigger) to smoke, such as: ? Bars. ? Parties. ? Smoke-break areas at work.  Spend time with people who do not smoke.  Lower the stress in your life. Stress can make you want to smoke. Try these things to help your stress: ? Getting regular exercise. ? Deep-breathing exercises. ? Yoga. ? Meditating. ? Doing a body scan. To do this, close your eyes, focus on one area of your body at a time from head to toe, and notice which parts of your body are tense. Try to relax the muscles in those areas.  Download or buy apps on your mobile phone or tablet that can help you stick to your quit plan. There are many free apps, such as QuitGuide from the CDC (Centers for Disease Control and Prevention). You can find more support from smokefree.gov and other websites.  This information is not intended to replace advice given to you by your health care provider. Make sure you discuss any questions you have with your health care provider. Document Released: 07/11/2009 Document   Revised: 05/12/2016 Document Reviewed: 01/29/2015 Elsevier Interactive Patient Education  2018 Elsevier Inc.  

## 2018-05-13 ENCOUNTER — Other Ambulatory Visit: Payer: Self-pay

## 2018-05-13 ENCOUNTER — Ambulatory Visit (HOSPITAL_COMMUNITY): Payer: Medicare Other | Attending: Orthopaedic Surgery

## 2018-05-13 ENCOUNTER — Encounter (HOSPITAL_COMMUNITY): Payer: Self-pay

## 2018-05-13 DIAGNOSIS — R29898 Other symptoms and signs involving the musculoskeletal system: Secondary | ICD-10-CM | POA: Insufficient documentation

## 2018-05-13 DIAGNOSIS — G8929 Other chronic pain: Secondary | ICD-10-CM | POA: Diagnosis present

## 2018-05-13 DIAGNOSIS — M25611 Stiffness of right shoulder, not elsewhere classified: Secondary | ICD-10-CM | POA: Diagnosis present

## 2018-05-13 DIAGNOSIS — M25511 Pain in right shoulder: Secondary | ICD-10-CM | POA: Insufficient documentation

## 2018-05-13 NOTE — Patient Instructions (Signed)
Complete the following exercises 2-3 times a day.  Doorway Stretch  Place each hand opposite each other on the doorway. (You can change where you feel the stretch by moving arms higher or lower.) Step through with one foot and bend front knee until a stretch is felt and hold. Step through with the opposite foot on the next rep. Hold for __10-15___ seconds. Repeat __2__times.     Internal Rotation Across Back  INTERNAL ROTATION TOWEL STRETCH - IR TOWEL  Gently pull up your affected arm behind your back with the assist of a towel     Hold for 10-15 seconds. Complete 2 times.         Posterior Capsule Stretch   Stand or sit, one arm across body so hand rests over opposite shoulder. Gently push on crossed elbow with other hand until stretch is felt in shoulder of crossed arm. Hold _10-15__ seconds.  Repeat _2__ times per session. Do ___ sessions per day.   Wall Flexion  Slide your arm up the wall or door frame until a stretch is felt in your shoulder . Hold for 10-15 seconds. Complete 2 times     Shoulder Abduction Stretch  Stand side ways by a wall with affected up on wall. Gently step in toward wall to feel stretch. Hold for 10-15 seconds. Complete 2 times.

## 2018-05-13 NOTE — Therapy (Signed)
[facility] [address] [place], [place], [address] Phone: [phone]   Fax:  [phone]  Occupational Therapy Evaluation  Patient Details  Name: Peter Campbell MRN: [id number] Date of Birth: 1956-01-09 Referring Provider: Dr. Darreld Mclean   Encounter Date: 05/13/2018  OT End of Session - 05/13/18 1608    Visit Number  1    Number of Visits  12    Date for OT Re-Evaluation  06/24/18    Authorization Type  1) Medicare 2) Medicaid    OT Start Time  1509    OT Stop Time  1539    OT Time Calculation (min)  30 min    Activity Tolerance  Patient tolerated treatment well    Behavior During Therapy  [facility] for tasks assessed/performed       Past Medical History:  Diagnosis Date  . Asthma   . Chronic back pain   . Collagen vascular disease (HCC)   . Colonic mass    History, s/p removal; per patient.  . Coronary atherosclerosis of native coronary artery    Dense coronary atherosclerosis by chest CT November 2014   . Essential hypertension, benign   . Essential tremor   . History of chicken pox   . History of [place] measles   . Sciatica     Past Surgical History:  Procedure Laterality Date  . ABDOMINAL SURGERY    . CIRCUMCISION    . COLON SURGERY     for mass removal  . COLONOSCOPY WITH PROPOFOL N/A 05/03/2017   Procedure: COLONOSCOPY WITH PROPOFOL;  Surgeon: Corbin Ade, MD;  [facility];  Service: Endoscopy;  Laterality: N/A;  1015  . POLYPECTOMY  05/03/2017   Procedure: POLYPECTOMY;  Surgeon: Corbin Ade, MD;  [facility];  Service: Endoscopy;;  descending colon and rectal  . SHOULDER ACROMIOPLASTY Right 06/12/2014   Procedure: RIGHT SHOULDER ACROMIOPLASTY;  Surgeon: Darreld Mclean, MD;  Location: [facility];  Service: Orthopedics;  Laterality: Right;  . SHOULDER OPEN ROTATOR CUFF REPAIR Right 06/12/2014   Procedure: OPEN REPAIR ROTATOR CUFF RIGHT ;  Surgeon: Darreld Mclean, MD;  Location: [facility];  Service:  Orthopedics;  Laterality: Right;  . TONSILLECTOMY      There were no vitals filed for this visit.  Subjective Assessment - 05/13/18 1512    Subjective   S: It never stopped hurting since I had the surgery but now it's worse.     Pertinent History  Patient is a 62 y/o male S/P right chronic shoulder pain with MRI showing a small spur the anterior acromion is noted. Patient has a history of right RTC repair which was completed on 06/12/14 and received OP OT services at this clinic. Patient was discharged with 5/5 strength and full A/ROM. Dr. Hilda Lias has referred patient to occupational therapy for evaluation and treatment.     Special Tests  FOTO: complete next session    Patient Stated Goals  To decrease pain level and be able to use RUE as dominant.     Currently in Pain?  Yes    Pain Score  7     Pain Location  Shoulder    Pain Orientation  Right    Pain Descriptors / Indicators  Constant;Aching    Pain Type  Chronic pain    Pain Radiating Towards  up the neck and down arm    Pain Onset  Other (comment)    Pain Frequency  Constant  Aggravating Factors   weather, increase use    Pain Relieving Factors  pain medication, heat    Effect of Pain on Daily Activities  severe effect when the pain is high    Multiple Pain Sites  No        [facility] OT Assessment - 05/13/18 1514      Assessment   Medical Diagnosis  right shoulder pain    Referring Provider  Dr. Darreld Mclean    Onset Date/Surgical Date  --   since surgery in 2015 with pain worsening recently.    Hand Dominance  Right    Next MD Visit  05/26/18    Prior Therapy  None for this shoulder pain.       Precautions   Precautions  None      Restrictions   Weight Bearing Restrictions  No      Balance Screen   Has the patient fallen in the past 6 months  No      Home  Environment   Family/patient expects to be discharged to:  Private residence      Prior Function   Level of Independence  Independent    Vocation  On  disability    Leisure  Plays music frequently. Enjoys singing and recording songs.       ADL   ADL comments  Patient is noticing pain in his neck and decreased arm swing when walking. Difficulty with strength and occassionally reaching is affected.       Mobility   Mobility Status  Independent      Written Expression   Dominant Hand  Right      Vision - History   Baseline Vision  No visual deficits      Cognition   Overall Cognitive Status  Within Functional Limits for tasks assessed      ROM / Strength   AROM / PROM / Strength  AROM;PROM;Strength      Palpation   Palpation comment  Mod fascial restrictions in right upper arm, trapezius, and scapularis region.       AROM   Overall AROM Comments  Assessed seated. IR/er abducted.     AROM Assessment Site  Shoulder    Right/Left Shoulder  Right    Right Shoulder Flexion  135 Degrees    Right Shoulder ABduction  125 Degrees    Right Shoulder Internal Rotation  50 Degrees    Right Shoulder External Rotation  50 Degrees   with shoulder adducted: 40     PROM   Overall PROM   Within functional limits for tasks performed      Strength   Overall Strength  Deficits    Overall Strength Comments  Assessed seated. IR/er adducted    Strength Assessment Site  Shoulder    Right/Left Shoulder  Right    Right Shoulder Flexion  4-/5    Right Shoulder ABduction  4-/5    Right Shoulder Internal Rotation  4/5    Right Shoulder External Rotation  3+/5                      OT Education - 05/13/18 1608    Education Details  shoulder stretches    Person(s) Educated  Patient    Methods  Explanation;Demonstration;Verbal cues;Handout;Tactile cues    Comprehension  Returned demonstration;Verbalized understanding       OT Short Term Goals - 05/13/18 1619      OT SHORT TERM GOAL #1  Title  Patient will be  educated and independent with HEP to facilitate progress with therapy and increase ability to return to using RUE as  dominant.    Time  3    Period  Weeks    Status  New    Target Date  06/03/18      OT SHORT TERM GOAL #2   Title  Patient will report a decreased pain level of 4/10 when completing daily tasks using RUE.     Time  3    Period  Weeks    Status  New      OT SHORT TERM GOAL #3   Title  Patient will decrease fascial restrictions to min amount in RUE in order to increase functional mobility and decrease pain level when reaching overhead.     Time  3    Period  Weeks    Status  New        OT Long Term Goals - 05/13/18 1627      OT LONG TERM GOAL #1   Title  patient will return to highest level of independence while using his RUE for all daily and leisure tasks as his dominant extremity.     Time  6    Period  Weeks    Status  New    Target Date  06/24/18      OT LONG TERM GOAL #2   Title  Patient will report a decrease level of pain and discomfort of approximately 3/10 when completing daily tasks using his RUE.     Time  6    Period  Weeks    Status  New      OT LONG TERM GOAL #3   Title  Patient will decrease fascial restrictions to trace amount in his right UE to increase functional mobilty and functional reaching ability.     Time  6    Period  Weeks    Status  New      OT LONG TERM GOAL #4   Title  Patient will increase RUE A/ROM to WNL in order to be able to reach overhead and out to the side with less difficulty.     Time  6    Period  Weeks    Status  New      OT LONG TERM GOAL #5   Title  Patient will increase RUE strength to 5/5 in order to return to completing normal lifting tasks without difficulty.     Time  6    Period  Weeks    Status  New            Plan - 05/13/18 1610    Clinical Impression Statement  A: Patient is a 62 y/o male S/P right shoulder pain causing increased fascial restrictions and decreased ROM and strength resulting in difficulty and discomfort when completing daily tasks.     Occupational Profile and client history currently  impacting functional performance  patient is motivated to return to [employer]    Occupational performance deficits (Please refer to evaluation for details):  ADL's;Rest and Sleep;Leisure    Rehab Potential  Excellent    Current Impairments/barriers affecting progress:  History of  right RTC repair    OT Frequency  2x / week    OT Duration  6 weeks    OT Treatment/Interventions  Self-care/ADL training;Moist Heat;DME and/or AE instruction;Therapeutic activities;Ultrasound;Therapeutic exercise;Passive range of motion;Cryotherapy;Electrical Stimulation;Manual Therapy;Patient/family education    Plan  P: Patient will  benefit from skilled OT services to increase functional performance during ADL tasks while using RUE as dominant. Treatment Plan: Myofascial release, manual techniques, A/ROM, general shoulder and scapular strengthening. Modalities PRN. Next session: FOTO.     Clinical Decision Making  Limited treatment options, no task modification necessary    Consulted and Agree with Plan of Care  Patient       Patient will benefit from skilled therapeutic intervention in order to improve the following deficits and impairments:  Decreased range of motion, Increased fascial restrictions, Pain, Impaired UE functional use, Decreased strength  Visit Diagnosis: Other symptoms and signs involving the musculoskeletal system - Plan: Ot plan of care cert/re-cert  Chronic right shoulder pain - Plan: Ot plan of care cert/re-cert  Stiffness of right shoulder, not elsewhere classified - Plan: Ot plan of care cert/re-cert    Problem List Patient Active Problem List   Diagnosis Date Noted  . Abdominal pain 08/04/2017  . Constipation 01/11/2017  . History of colonic polyps 01/11/2017  . Right shoulder pain 12/03/2015  . Muscle weakness (generalized) 07/02/2014  . Decreased range of motion of right shoulder 07/02/2014  . Rotator cuff impingement syndrome of right shoulder 06/12/2014  . Coronary atherosclerosis  of native coronary artery 09/26/2013  . Tobacco abuse 09/26/2013  . Essential hypertension, benign 09/25/2013   Limmie PatriciaLaura Elliet Goodnow, OTR/L,CBIS  [phone]  05/13/2018, 4:32 PM  [facility] [address] [place], [place], [address] Phone: [phone]   Fax:  [phone]  Name: Peter Campbell MRN: [id number] Date of Birth: 01/02/1956

## 2018-05-17 ENCOUNTER — Other Ambulatory Visit: Payer: Self-pay | Admitting: Nurse Practitioner

## 2018-05-17 DIAGNOSIS — K5900 Constipation, unspecified: Secondary | ICD-10-CM

## 2018-05-18 ENCOUNTER — Ambulatory Visit (HOSPITAL_COMMUNITY): Payer: Medicare Other

## 2018-05-18 DIAGNOSIS — G8929 Other chronic pain: Secondary | ICD-10-CM

## 2018-05-18 DIAGNOSIS — R29898 Other symptoms and signs involving the musculoskeletal system: Secondary | ICD-10-CM

## 2018-05-18 DIAGNOSIS — M25511 Pain in right shoulder: Secondary | ICD-10-CM

## 2018-05-18 DIAGNOSIS — M25611 Stiffness of right shoulder, not elsewhere classified: Secondary | ICD-10-CM

## 2018-05-18 NOTE — Therapy (Signed)
[facility] [address] [place], [place], [address] Phone: [phone]   Fax:  [phone]  Occupational Therapy Treatment  Patient Details  Name: Peter Campbell MRN: [id number] Date of Birth: 08/27/1956 Referring Provider: Dr. Darreld McleanWayne Keeling   Encounter Date: 05/18/2018  OT End of Session - 05/18/18 1623    Visit Number  2    Number of Visits  12    Date for OT Re-Evaluation  06/24/18    Authorization Type  1) Medicare 2) Medicaid    OT Start Time  1434    OT Stop Time  1515    OT Time Calculation (min)  41 min    Activity Tolerance  Patient tolerated treatment well    Behavior During Therapy  [facility] for tasks assessed/performed       Past Medical History:  Diagnosis Date  . Asthma   . Chronic back pain   . Collagen vascular disease (HCC)   . Colonic mass    History, s/p removal; per patient.  . Coronary atherosclerosis of native coronary artery    Dense coronary atherosclerosis by chest CT November 2014   . Essential hypertension, benign   . Essential tremor   . History of chicken pox   . History of [place] measles   . Sciatica     Past Surgical History:  Procedure Laterality Date  . ABDOMINAL SURGERY    . CIRCUMCISION    . COLON SURGERY     for mass removal  . COLONOSCOPY WITH PROPOFOL N/A 05/03/2017   Procedure: COLONOSCOPY WITH PROPOFOL;  Surgeon: Corbin Adeourk, Robert M, MD;  [facility];  Service: Endoscopy;  Laterality: N/A;  1015  . POLYPECTOMY  05/03/2017   Procedure: POLYPECTOMY;  Surgeon: Corbin Adeourk, Robert M, MD;  [facility];  Service: Endoscopy;;  descending colon and rectal  . SHOULDER ACROMIOPLASTY Right 06/12/2014   Procedure: RIGHT SHOULDER ACROMIOPLASTY;  Surgeon: Darreld McleanWayne Keeling, MD;  Location: [facility];  Service: Orthopedics;  Laterality: Right;  . SHOULDER OPEN ROTATOR CUFF REPAIR Right 06/12/2014   Procedure: OPEN REPAIR ROTATOR CUFF RIGHT ;  Surgeon: Darreld McleanWayne Keeling, MD;  Location: [facility];  Service:  Orthopedics;  Laterality: Right;  . TONSILLECTOMY      There were no vitals filed for this visit.  Subjective Assessment - 05/18/18 1438    Subjective   S: It might be moving a little more.     Special Tests  FOTO score: 49/100    Currently in Pain?  Yes    Pain Score  7     Pain Location  Shoulder    Pain Orientation  Right    Pain Descriptors / Indicators  Aching;Constant    Pain Type  Chronic pain         [facility] OT Assessment - 05/18/18 1453      Assessment   Medical Diagnosis  right shoulder pain      Precautions   Precautions  None      Observation/Other Assessments   Focus on Therapeutic Outcomes (FOTO)   49/100               OT Treatments/Exercises (OP) - 05/18/18 1454      Exercises   Exercises  Shoulder      Shoulder Exercises: Supine   Protraction  PROM;AROM;10 reps    Horizontal ABduction  PROM;AROM;10 reps    External Rotation  PROM;AROM;10 reps    Internal Rotation  PROM;AROM;10 reps  Flexion  PROM;AROM;10 reps    ABduction  PROM;AROM;10 reps      Shoulder Exercises: Standing   Protraction  AROM;10 reps    Horizontal ABduction  AROM;10 reps    External Rotation  AROM;10 reps    Internal Rotation  AROM;10 reps    Flexion  AROM;10 reps    ABduction  AROM;10 reps      Manual Therapy   Manual Therapy  Soft tissue mobilization    Manual therapy comments  Manual therapy completed prior to exercises.    Soft tissue mobilization  Myofascial release and manual stretching completed to right upper arm, trapezius, and scapularis region to decrease fascial restrictions and increase ROM in a pain free zone.              OT Education - 05/18/18 1625    Education Details  Patient was given OT evaluation print out. Goals and plan of care reviewed.     Person(s) Educated  Patient    Methods  Explanation;Handout    Comprehension  Verbalized understanding       OT Short Term Goals - 05/18/18 1619      OT SHORT TERM GOAL #1   Title   Patient will be  educated and independent with HEP to facilitate progress with therapy and increase ability to return to using RUE as dominant.    Time  3    Period  Weeks    Status  On-going      OT SHORT TERM GOAL #2   Title  Patient will report a decreased pain level of 4/10 when completing daily tasks using RUE.     Time  3    Period  Weeks    Status  On-going      OT SHORT TERM GOAL #3   Title  Patient will decrease fascial restrictions to min amount in RUE in order to increase functional mobility and decrease pain level when reaching overhead.     Time  3    Period  Weeks    Status  On-going        OT Long Term Goals - 05/18/18 1619      OT LONG TERM GOAL #1   Title  patient will return to highest level of independence while using his RUE for all daily and leisure tasks as his dominant extremity.     Time  6    Period  Weeks    Status  On-going      OT LONG TERM GOAL #2   Title  Patient will report a decrease level of pain and discomfort of approximately 3/10 when completing daily tasks using his RUE.     Time  6    Period  Weeks    Status  On-going      OT LONG TERM GOAL #3   Title  Patient will decrease fascial restrictions to trace amount in his right UE to increase functional mobilty and functional reaching ability.     Time  6    Period  Weeks    Status  On-going      OT LONG TERM GOAL #4   Title  Patient will increase RUE A/ROM to WNL in order to be able to reach overhead and out to the side with less difficulty.     Time  6    Period  Weeks    Status  On-going      OT LONG TERM GOAL #5   Title  Patient will increase RUE strength to 5/5 in order to return to completing normal lifting tasks without difficulty.     Time  6    Period  Weeks    Status  On-going            Plan - 05/18/18 1623    Clinical Impression Statement  A: Initiated myofascial release, manual stretching, and A/ROM shoulder exercises. patient experiencing pain during shoulder  flexion at approximately 125 degrees during passive stretching then pain decreasing once past this range. pain may be caused by born spur. During session patient was able to achieve full passive ROM when standing which was not achieved at evaluation. VC for form and technique as needed.     Plan  P: Update HEP for A/ROM. Add proximal shoulder strengthening.     Consulted and Agree with Plan of Care  Patient       Patient will benefit from skilled therapeutic intervention in order to improve the following deficits and impairments:  Decreased range of motion, Increased fascial restrictions, Pain, Impaired UE functional use, Decreased strength  Visit Diagnosis: Other symptoms and signs involving the musculoskeletal system  Chronic right shoulder pain  Stiffness of right shoulder, not elsewhere classified    Problem List Patient Active Problem List   Diagnosis Date Noted  . Abdominal pain 08/04/2017  . Constipation 01/11/2017  . History of colonic polyps 01/11/2017  . Right shoulder pain 12/03/2015  . Muscle weakness (generalized) 07/02/2014  . Decreased range of motion of right shoulder 07/02/2014  . Rotator cuff impingement syndrome of right shoulder 06/12/2014  . Coronary atherosclerosis of native coronary artery 09/26/2013  . Tobacco abuse 09/26/2013  . Essential hypertension, benign 09/25/2013   Limmie PatriciaLaura Dorr Perrot, OTR/L,CBIS  [phone]  05/18/2018, 4:26 PM  [facility] [address] [place], [place], [address] Phone: [phone]   Fax:  [phone]  Name: Peter BruceRicky A Campbell MRN: [id number] Date of Birth: 02/24/1956

## 2018-05-20 ENCOUNTER — Ambulatory Visit (HOSPITAL_COMMUNITY): Payer: Medicare Other | Admitting: Occupational Therapy

## 2018-05-20 ENCOUNTER — Telehealth (HOSPITAL_COMMUNITY): Payer: Self-pay | Admitting: Occupational Therapy

## 2018-05-20 NOTE — Telephone Encounter (Signed)
He has to go out of town due to unexpected family issure - r/s at the end of this apptment times. Done NF 05/20/18

## 2018-05-24 ENCOUNTER — Ambulatory Visit (HOSPITAL_COMMUNITY): Payer: Medicare Other | Admitting: Occupational Therapy

## 2018-05-24 ENCOUNTER — Encounter (HOSPITAL_COMMUNITY): Payer: Self-pay | Admitting: Occupational Therapy

## 2018-05-24 ENCOUNTER — Other Ambulatory Visit: Payer: Self-pay | Admitting: Orthopaedic Surgery

## 2018-05-24 DIAGNOSIS — R29898 Other symptoms and signs involving the musculoskeletal system: Secondary | ICD-10-CM

## 2018-05-24 DIAGNOSIS — G8929 Other chronic pain: Secondary | ICD-10-CM

## 2018-05-24 DIAGNOSIS — M25511 Pain in right shoulder: Principal | ICD-10-CM

## 2018-05-24 DIAGNOSIS — M25611 Stiffness of right shoulder, not elsewhere classified: Secondary | ICD-10-CM

## 2018-05-24 NOTE — Therapy (Signed)
[facility] [address] [place], [place], [address] Phone: [phone]   Fax:  [phone]  Occupational Therapy Treatment  Patient Details  Name: Peter BruceRicky A Band MRN: [id number] Date of Birth: 10/08/1955 Referring Provider: Dr. Darreld McleanWayne Keeling   Encounter Date: 05/24/2018  OT End of Session - 05/24/18 1508    Visit Number  3    Number of Visits  12    Date for OT Re-Evaluation  06/24/18    Authorization Type  1) Medicare 2) Medicaid    OT Start Time  1422    OT Stop Time  1505    OT Time Calculation (min)  43 min    Activity Tolerance  Patient tolerated treatment well    Behavior During Therapy  [facility] for tasks assessed/performed       Past Medical History:  Diagnosis Date  . Asthma   . Chronic back pain   . Collagen vascular disease (HCC)   . Colonic mass    History, s/p removal; per patient.  . Coronary atherosclerosis of native coronary artery    Dense coronary atherosclerosis by chest CT November 2014   . Essential hypertension, benign   . Essential tremor   . History of chicken pox   . History of [place] measles   . Sciatica     Past Surgical History:  Procedure Laterality Date  . ABDOMINAL SURGERY    . CIRCUMCISION    . COLON SURGERY     for mass removal  . COLONOSCOPY WITH PROPOFOL N/A 05/03/2017   Procedure: COLONOSCOPY WITH PROPOFOL;  Surgeon: Corbin Adeourk, Robert M, MD;  [facility];  Service: Endoscopy;  Laterality: N/A;  1015  . POLYPECTOMY  05/03/2017   Procedure: POLYPECTOMY;  Surgeon: Corbin Adeourk, Robert M, MD;  [facility];  Service: Endoscopy;;  descending colon and rectal  . SHOULDER ACROMIOPLASTY Right 06/12/2014   Procedure: RIGHT SHOULDER ACROMIOPLASTY;  Surgeon: Darreld McleanWayne Keeling, MD;  Location: [facility];  Service: Orthopedics;  Laterality: Right;  . SHOULDER OPEN ROTATOR CUFF REPAIR Right 06/12/2014   Procedure: OPEN REPAIR ROTATOR CUFF RIGHT ;  Surgeon: Darreld McleanWayne Keeling, MD;  Location: [facility];  Service:  Orthopedics;  Laterality: Right;  . TONSILLECTOMY      There were no vitals filed for this visit.  Subjective Assessment - 05/24/18 1422    Subjective   S: My shoulder is not feeling great today    Pertinent History  Patient is a 62 y/o male S/P right chronic shoulder pain with MRI showing a small spur the anterior acromion is noted. Patient has a history of right RTC repair which was completed on 06/12/14 and received OP OT services at this clinic. Patient was discharged with 5/5 strength and full A/ROM. Dr. Hilda LiasKeeling has referred patient to occupational therapy for evaluation and treatment.     Currently in Pain?  Yes    Pain Score  6     Pain Location  Shoulder    Pain Orientation  Right    Pain Descriptors / Indicators  Aching    Pain Type  Chronic pain    Pain Radiating Towards  Up neck and down arm    Pain Onset  More than a month ago    Pain Frequency  Constant    Aggravating Factors   increased use, weather    Pain Relieving Factors  heat, medication for pain    Effect of Pain on Daily Activities  greatly effects activities when  pain is severe     Multiple Pain Sites  No         [facility] OT Assessment - 05/24/18 1421      Assessment   Medical Diagnosis  right shoulder pain      Precautions   Precautions  None               OT Treatments/Exercises (OP) - 05/24/18 1447      Exercises   Exercises  Shoulder      Shoulder Exercises: Supine   Protraction  PROM;5 reps;AROM;12 reps    Horizontal ABduction  PROM;5 reps;AROM;12 reps    External Rotation  PROM;5 reps;AROM;12 reps    Internal Rotation  PROM;5 reps;AROM;12 reps    Flexion  PROM;5 reps;AROM;12 reps    ABduction  PROM;5 reps;AROM;12 reps      Shoulder Exercises: Standing   Protraction  AROM;12 reps    Horizontal ABduction  AROM;12 reps    External Rotation  AROM;12 reps    Internal Rotation  AROM;12 reps    Flexion  AROM;12 reps    ABduction  AROM;12 reps    Extension  Theraband;10 reps     Theraband Level (Shoulder Extension)  Level 2 (Red)    Row  Theraband;10 reps    Theraband Level (Shoulder Row)  Level 2 (Red)    Retraction  Theraband;10 reps    Theraband Level (Shoulder Retraction)  Level 2 (Red)      Shoulder Exercises: ROM/Strengthening   X to V Arms  12X each no rest breaks    Proximal Shoulder Strengthening, Supine  10X each no rest break    Proximal Shoulder Strengthening, Seated  10X each no rest breaks    Ball on Wall  1' flexion       Manual Therapy   Manual Therapy  Soft tissue mobilization    Manual therapy comments  Manual therapy completed prior to exercises.    Soft tissue mobilization  Myofascial release and manual stretching completed to right upper arm, trapezius, and scapularis region to decrease fascial restrictions and increase ROM in a pain free zone.              OT Education - 05/24/18 1455    Education Details  A/ROM    Person(s) Educated  Patient    Methods  Explanation;Demonstration;Handout    Comprehension  Verbalized understanding;Returned demonstration       OT Short Term Goals - 05/18/18 1619      OT SHORT TERM GOAL #1   Title  Patient will be  educated and independent with HEP to facilitate progress with therapy and increase ability to return to using RUE as dominant.    Time  3    Period  Weeks    Status  On-going      OT SHORT TERM GOAL #2   Title  Patient will report a decreased pain level of 4/10 when completing daily tasks using RUE.     Time  3    Period  Weeks    Status  On-going      OT SHORT TERM GOAL #3   Title  Patient will decrease fascial restrictions to min amount in RUE in order to increase functional mobility and decrease pain level when reaching overhead.     Time  3    Period  Weeks    Status  On-going        OT Long Term Goals - 05/18/18 1610  OT LONG TERM GOAL #1   Title  patient will return to highest level of independence while using his RUE for all daily and leisure tasks as his  dominant extremity.     Time  6    Period  Weeks    Status  On-going      OT LONG TERM GOAL #2   Title  Patient will report a decrease level of pain and discomfort of approximately 3/10 when completing daily tasks using his RUE.     Time  6    Period  Weeks    Status  On-going      OT LONG TERM GOAL #3   Title  Patient will decrease fascial restrictions to trace amount in his right UE to increase functional mobilty and functional reaching ability.     Time  6    Period  Weeks    Status  On-going      OT LONG TERM GOAL #4   Title  Patient will increase RUE A/ROM to WNL in order to be able to reach overhead and out to the side with less difficulty.     Time  6    Period  Weeks    Status  On-going      OT LONG TERM GOAL #5   Title  Patient will increase RUE strength to 5/5 in order to return to completing normal lifting tasks without difficulty.     Time  6    Period  Weeks    Status  On-going            Plan - 05/24/18 1509    Clinical Impression Statement  A: Continued manual techniques to address fascial restrictions in upper arm and scapularis regions. Continued A/ROM exercises, Pt with full range this date. Pt continues to have pain at 60% range and above. Added scapular Theraband, X to V arms, proximal shoulder strengthening., and ball on the wall to work on improving ROM and strength in RUE. Verbal cueing for form and technique during exercises.     Plan  P: Follow up on HEP. Add ball on the wall in abduction. Continue with scapular Theraband working on form.        Patient will benefit from skilled therapeutic intervention in order to improve the following deficits and impairments:  Decreased range of motion, Increased fascial restrictions, Pain, Impaired UE functional use, Decreased strength  Visit Diagnosis: Other symptoms and signs involving the musculoskeletal system  Chronic right shoulder pain  Stiffness of right shoulder, not elsewhere  classified    Problem List Patient Active Problem List   Diagnosis Date Noted  . Abdominal pain 08/04/2017  . Constipation 01/11/2017  . History of colonic polyps 01/11/2017  . Right shoulder pain 12/03/2015  . Muscle weakness (generalized) 07/02/2014  . Decreased range of motion of right shoulder 07/02/2014  . Rotator cuff impingement syndrome of right shoulder 06/12/2014  . Coronary atherosclerosis of native coronary artery 09/26/2013  . Tobacco abuse 09/26/2013  . Essential hypertension, benign 09/25/2013   Ezra Sites, OTR/L  [phone] 05/24/2018, 3:39 PM  [facility] [address] [place], [place], [address] Phone: [phone]   Fax:  [phone]  Name: WOODFIN KISS MRN: [id number] Date of Birth: [birth year]

## 2018-05-24 NOTE — Patient Instructions (Signed)

## 2018-05-26 ENCOUNTER — Encounter: Payer: Self-pay | Admitting: Orthopaedic Surgery

## 2018-05-26 ENCOUNTER — Ambulatory Visit (INDEPENDENT_AMBULATORY_CARE_PROVIDER_SITE_OTHER): Payer: Medicare Other | Admitting: Orthopaedic Surgery

## 2018-05-26 ENCOUNTER — Telehealth (HOSPITAL_COMMUNITY): Payer: Self-pay | Admitting: Internal Medicine

## 2018-05-26 VITALS — BP 130/82 | HR 70 | Ht 68.0 in | Wt 169.0 lb

## 2018-05-26 DIAGNOSIS — M25511 Pain in right shoulder: Secondary | ICD-10-CM | POA: Diagnosis not present

## 2018-05-26 DIAGNOSIS — F17210 Nicotine dependence, cigarettes, uncomplicated: Secondary | ICD-10-CM

## 2018-05-26 DIAGNOSIS — G8929 Other chronic pain: Secondary | ICD-10-CM | POA: Diagnosis not present

## 2018-05-26 NOTE — Telephone Encounter (Signed)
05/26/18  pt cx said he would be going out of town

## 2018-05-26 NOTE — Patient Instructions (Signed)
Steps to Quit Smoking Smoking tobacco can be bad for your health. It can also affect almost every organ in your body. Smoking puts you and people around you at risk for many serious long-lasting (chronic) diseases. Quitting smoking is hard, but it is one of the best things that you can do for your health. It is never too late to quit. What are the benefits of quitting smoking? When you quit smoking, you lower your risk for getting serious diseases and conditions. They can include:  Lung cancer or lung disease.  Heart disease.  Stroke.  Heart attack.  Not being able to have children (infertility).  Weak bones (osteoporosis) and broken bones (fractures).  If you have coughing, wheezing, and shortness of breath, those symptoms may get better when you quit. You may also get sick less often. If you are pregnant, quitting smoking can help to lower your chances of having a baby of low birth weight. What can I do to help me quit smoking? Talk with your doctor about what can help you quit smoking. Some things you can do (strategies) include:  Quitting smoking totally, instead of slowly cutting back how much you smoke over a period of time.  Going to in-person counseling. You are more likely to quit if you go to many counseling sessions.  Using resources and support systems, such as: ? Online chats with a counselor. ? Phone quitlines. ? Printed self-help materials. ? Support groups or group counseling. ? Text messaging programs. ? Mobile phone apps or applications.  Taking medicines. Some of these medicines may have nicotine in them. If you are pregnant or breastfeeding, do not take any medicines to quit smoking unless your doctor says it is okay. Talk with your doctor about counseling or other things that can help you.  Talk with your doctor about using more than one strategy at the same time, such as taking medicines while you are also going to in-person counseling. This can help make  quitting easier. What things can I do to make it easier to quit? Quitting smoking might feel very hard at first, but there is a lot that you can do to make it easier. Take these steps:  Talk to your family and friends. Ask them to support and encourage you.  Call phone quitlines, reach out to support groups, or work with a counselor.  Ask people who smoke to not smoke around you.  Avoid places that make you want (trigger) to smoke, such as: ? Bars. ? Parties. ? Smoke-break areas at work.  Spend time with people who do not smoke.  Lower the stress in your life. Stress can make you want to smoke. Try these things to help your stress: ? Getting regular exercise. ? Deep-breathing exercises. ? Yoga. ? Meditating. ? Doing a body scan. To do this, close your eyes, focus on one area of your body at a time from head to toe, and notice which parts of your body are tense. Try to relax the muscles in those areas.  Download or buy apps on your mobile phone or tablet that can help you stick to your quit plan. There are many free apps, such as QuitGuide from the CDC (Centers for Disease Control and Prevention). You can find more support from smokefree.gov and other websites.  This information is not intended to replace advice given to you by your health care provider. Make sure you discuss any questions you have with your health care provider. Document Released: 07/11/2009 Document   Revised: 05/12/2016 Document Reviewed: 01/29/2015 Elsevier Interactive Patient Education  2018 Elsevier Inc.  

## 2018-05-26 NOTE — Progress Notes (Signed)
PROCEDURE NOTE:  The patient request injection, verbal consent was obtained.  The right shoulder was prepped appropriately after time out was performed.   Sterile technique was observed and injection of 1 cc of Depo-Medrol 40 mg with several cc's of plain xylocaine. Anesthesia was provided by ethyl chloride and a 20-gauge needle was used to inject the shoulder area. A posterior approach was used.  The injection was tolerated well.  A band aid dressing was applied.  The patient was advised to apply ice later today and tomorrow to the injection sight as needed.  He is going to PT and is making slow progress.  Continue the PT.  Return in three weeks.  Call if any problem.  Precautions discussed.   Electronically Signed Darreld McleanWayne Ernestene Coover, MD 8/29/201910:30 AM

## 2018-05-27 ENCOUNTER — Ambulatory Visit (HOSPITAL_COMMUNITY): Payer: Medicare Other | Admitting: Occupational Therapy

## 2018-05-31 ENCOUNTER — Ambulatory Visit (HOSPITAL_COMMUNITY): Payer: Medicare Other | Attending: Orthopaedic Surgery | Admitting: Occupational Therapy

## 2018-05-31 ENCOUNTER — Encounter (HOSPITAL_COMMUNITY): Payer: Self-pay | Admitting: Occupational Therapy

## 2018-05-31 DIAGNOSIS — G8929 Other chronic pain: Secondary | ICD-10-CM | POA: Diagnosis present

## 2018-05-31 DIAGNOSIS — M25611 Stiffness of right shoulder, not elsewhere classified: Secondary | ICD-10-CM | POA: Insufficient documentation

## 2018-05-31 DIAGNOSIS — R29898 Other symptoms and signs involving the musculoskeletal system: Secondary | ICD-10-CM | POA: Insufficient documentation

## 2018-05-31 DIAGNOSIS — M25511 Pain in right shoulder: Secondary | ICD-10-CM | POA: Insufficient documentation

## 2018-05-31 NOTE — Patient Instructions (Signed)

## 2018-05-31 NOTE — Therapy (Signed)
[facility] [address] [place], [place], [address] Phone: [phone]   Fax:  [phone]  Occupational Therapy Treatment  Patient Details  Name: Peter Campbell MRN: [id number] Date of Birth: 12/13/1955 Referring Provider: Dr. Darreld Mclean   Encounter Date: 05/31/2018  OT End of Session - 05/31/18 1523    Visit Number  4    Number of Visits  12    Date for OT Re-Evaluation  06/24/18    Authorization Type  1) Medicare 2) Medicaid    OT Start Time  1430    OT Stop Time  1515    OT Time Calculation (min)  45 min    Activity Tolerance  Patient tolerated treatment well    Behavior During Therapy  [facility] for tasks assessed/performed       Past Medical History:  Diagnosis Date  . Asthma   . Chronic back pain   . Collagen vascular disease (HCC)   . Colonic mass    History, s/p removal; per patient.  . Coronary atherosclerosis of native coronary artery    Dense coronary atherosclerosis by chest CT November 2014   . Essential hypertension, benign   . Essential tremor   . History of chicken pox   . History of [place] measles   . Sciatica     Past Surgical History:  Procedure Laterality Date  . ABDOMINAL SURGERY    . CIRCUMCISION    . COLON SURGERY     for mass removal  . COLONOSCOPY WITH PROPOFOL N/A 05/03/2017   Procedure: COLONOSCOPY WITH PROPOFOL;  Surgeon: Corbin Ade, MD;  [facility];  Service: Endoscopy;  Laterality: N/A;  1015  . POLYPECTOMY  05/03/2017   Procedure: POLYPECTOMY;  Surgeon: Corbin Ade, MD;  [facility];  Service: Endoscopy;;  descending colon and rectal  . SHOULDER ACROMIOPLASTY Right 06/12/2014   Procedure: RIGHT SHOULDER ACROMIOPLASTY;  Surgeon: Darreld Mclean, MD;  Location: [facility];  Service: Orthopedics;  Laterality: Right;  . SHOULDER OPEN ROTATOR CUFF REPAIR Right 06/12/2014   Procedure: OPEN REPAIR ROTATOR CUFF RIGHT ;  Surgeon: Darreld Mclean, MD;  Location: [facility];  Service:  Orthopedics;  Laterality: Right;  . TONSILLECTOMY      There were no vitals filed for this visit.  Subjective Assessment - 05/31/18 1429    Subjective   S: I got an injection and am having less pain today.     Currently in Pain?  Yes    Pain Score  3     Pain Location  Shoulder    Pain Orientation  Right    Pain Descriptors / Indicators  Aching    Pain Type  Chronic pain    Pain Radiating Towards  down arm and up neck    Pain Onset  More than a month ago    Pain Frequency  Constant    Aggravating Factors   use, weather    Pain Relieving Factors  medication, heat    Effect of Pain on Daily Activities  severe pain greatly effects activities.     Multiple Pain Sites  No         [facility] OT Assessment - 05/31/18 1429      Assessment   Medical Diagnosis  right shoulder pain      Precautions   Precautions  None               OT Treatments/Exercises (OP) - 05/31/18 1429  Exercises   Exercises  Shoulder      Shoulder Exercises: Supine   Protraction  PROM;5 reps;AROM;12 reps    Horizontal ABduction  PROM;5 reps;AROM;12 reps    External Rotation  PROM;5 reps;AROM;12 reps    Internal Rotation  PROM;5 reps;AROM;12 reps    Flexion  PROM;5 reps;AROM;12 reps    ABduction  PROM;5 reps;AROM;12 reps      Shoulder Exercises: Standing   Protraction  AROM;12 reps    Horizontal ABduction  AROM;12 reps    External Rotation  AROM;12 reps    Internal Rotation  AROM;12 reps    Flexion  AROM;12 reps    ABduction  AROM;12 reps    Extension  Theraband;12 reps    Theraband Level (Shoulder Extension)  Level 2 (Red)    Row  Theraband;12 reps    Theraband Level (Shoulder Row)  Level 2 (Red)    Retraction  Theraband;12 reps    Theraband Level (Shoulder Retraction)  Level 2 (Red)      Shoulder Exercises: ROM/Strengthening   UBE (Upper Arm Bike)  Level 2; 3' forward; 3' reverse    X to V Arms  12X    Proximal Shoulder Strengthening, Supine  12X each no rest break    Proximal  Shoulder Strengthening, Seated  12X    Ball on Wall  1' flexion; 1' abduction       Manual Therapy   Manual Therapy  Soft tissue mobilization    Manual therapy comments  Manual therapy completed prior to exercises.    Soft tissue mobilization  Myofascial release and manual stretching completed to right upper arm, trapezius, and scapularis region to decrease fascial restrictions and increase ROM in a pain free zone.              OT Education - 05/31/18 1510    Education Details  Red scapular theraband    Person(s) Educated  Patient    Methods  Explanation;Demonstration;Handout    Comprehension  Verbalized understanding;Returned demonstration       OT Short Term Goals - 05/18/18 1619      OT SHORT TERM GOAL #1   Title  Patient will be  educated and independent with HEP to facilitate progress with therapy and increase ability to return to using RUE as dominant.    Time  3    Period  Weeks    Status  On-going      OT SHORT TERM GOAL #2   Title  Patient will report a decreased pain level of 4/10 when completing daily tasks using RUE.     Time  3    Period  Weeks    Status  On-going      OT SHORT TERM GOAL #3   Title  Patient will decrease fascial restrictions to min amount in RUE in order to increase functional mobility and decrease pain level when reaching overhead.     Time  3    Period  Weeks    Status  On-going        OT Long Term Goals - 05/18/18 1619      OT LONG TERM GOAL #1   Title  patient will return to highest level of independence while using his RUE for all daily and leisure tasks as his dominant extremity.     Time  6    Period  Weeks    Status  On-going      OT LONG TERM GOAL #2   Title  Patient will report a decrease level of pain and discomfort of approximately 3/10 when completing daily tasks using his RUE.     Time  6    Period  Weeks    Status  On-going      OT LONG TERM GOAL #3   Title  Patient will decrease fascial restrictions to trace  amount in his right UE to increase functional mobilty and functional reaching ability.     Time  6    Period  Weeks    Status  On-going      OT LONG TERM GOAL #4   Title  Patient will increase RUE A/ROM to WNL in order to be able to reach overhead and out to the side with less difficulty.     Time  6    Period  Weeks    Status  On-going      OT LONG TERM GOAL #5   Title  Patient will increase RUE strength to 5/5 in order to return to completing normal lifting tasks without difficulty.     Time  6    Period  Weeks    Status  On-going            Plan - 05/31/18 1524    Clinical Impression Statement  A: Continued with manual therapy for fascial restrictions in upper arm and scapularis areas. Pt continued scapular theraband, increasing from 10 to 12 repetitions. Pt continued with ball on the wall in flexion and added abduction to work on improving ROM and strength in RUE, and added UBE. Verbal cueing for form and technique during exercises.     Plan  P: Add sidelying exercises, Y lift off, attempt W arms and follow up on HEP.       Patient will benefit from skilled therapeutic intervention in order to improve the following deficits and impairments:  Decreased range of motion, Increased fascial restrictions, Pain, Impaired UE functional use, Decreased strength  Visit Diagnosis: Other symptoms and signs involving the musculoskeletal system  Chronic right shoulder pain  Stiffness of right shoulder, not elsewhere classified    Problem List Patient Active Problem List   Diagnosis Date Noted  . Abdominal pain 08/04/2017  . Constipation 01/11/2017  . History of colonic polyps 01/11/2017  . Right shoulder pain 12/03/2015  . Muscle weakness (generalized) 07/02/2014  . Decreased range of motion of right shoulder 07/02/2014  . Rotator cuff impingement syndrome of right shoulder 06/12/2014  . Coronary atherosclerosis of native coronary artery 09/26/2013  . Tobacco abuse 09/26/2013   . Essential hypertension, benign 09/25/2013   Ezra Sites, OTR/L  [phone] 05/31/2018, 3:41 PM  [place] [facility] [address] [place], [place], [address] Phone: [phone]   Fax:  [phone]  Name: Peter Campbell MRN: [id number] Date of Birth: 12-29-1955

## 2018-06-03 ENCOUNTER — Ambulatory Visit (HOSPITAL_COMMUNITY): Payer: Medicare Other

## 2018-06-03 ENCOUNTER — Encounter (HOSPITAL_COMMUNITY): Payer: Self-pay

## 2018-06-03 DIAGNOSIS — R29898 Other symptoms and signs involving the musculoskeletal system: Secondary | ICD-10-CM | POA: Diagnosis not present

## 2018-06-03 DIAGNOSIS — G8929 Other chronic pain: Secondary | ICD-10-CM

## 2018-06-03 DIAGNOSIS — M25511 Pain in right shoulder: Principal | ICD-10-CM

## 2018-06-03 NOTE — Therapy (Addendum)
[facility] [address] [place], [place], [address] Phone: [phone]   Fax:  [phone]  Occupational Therapy Treatment  Patient Details  Name: Peter Campbell MRN: [id number] Date of Birth: 11-May-1956 Referring Provider: Dr. Darreld Mclean   Encounter Date: 06/03/2018  OT End of Session - 06/03/18 1542    Visit Number  5    Number of Visits  12    Date for OT Re-Evaluation  06/24/18    Authorization Type  1) Medicare 2) Medicaid    OT Start Time  1428    OT Stop Time  1513    OT Time Calculation (min)  45 min    Activity Tolerance  Patient tolerated treatment well    Behavior During Therapy  [facility] for tasks assessed/performed       Past Medical History:  Diagnosis Date  . Asthma   . Chronic back pain   . Collagen vascular disease (HCC)   . Colonic mass    History, s/p removal; per patient.  . Coronary atherosclerosis of native coronary artery    Dense coronary atherosclerosis by chest CT November 2014   . Essential hypertension, benign   . Essential tremor   . History of chicken pox   . History of [place] measles   . Sciatica     Past Surgical History:  Procedure Laterality Date  . ABDOMINAL SURGERY    . CIRCUMCISION    . COLON SURGERY     for mass removal  . COLONOSCOPY WITH PROPOFOL N/A 05/03/2017   Procedure: COLONOSCOPY WITH PROPOFOL;  Surgeon: Corbin Ade, MD;  [facility];  Service: Endoscopy;  Laterality: N/A;  1015  . POLYPECTOMY  05/03/2017   Procedure: POLYPECTOMY;  Surgeon: Corbin Ade, MD;  [facility];  Service: Endoscopy;;  descending colon and rectal  . SHOULDER ACROMIOPLASTY Right 06/12/2014   Procedure: RIGHT SHOULDER ACROMIOPLASTY;  Surgeon: Darreld Mclean, MD;  Location: [facility];  Service: Orthopedics;  Laterality: Right;  . SHOULDER OPEN ROTATOR CUFF REPAIR Right 06/12/2014   Procedure: OPEN REPAIR ROTATOR CUFF RIGHT ;  Surgeon: Darreld Mclean, MD;  Location: [facility];  Service:  Orthopedics;  Laterality: Right;  . TONSILLECTOMY      There were no vitals filed for this visit.  Subjective Assessment - 06/03/18 1553    Subjective   S: I think it's been hurting more because of the weather.     Currently in Pain?  Yes    Pain Score  6     Pain Location  Shoulder    Pain Orientation  Right    Pain Descriptors / Indicators  Aching    Pain Type  Chronic pain    Pain Radiating Towards  Elbow    Pain Onset  More than a month ago    Pain Frequency  Constant    Aggravating Factors   Weather, Movement    Pain Relieving Factors  Medication, Heat    Effect of Pain on Daily Activities  Moderate effect on daily activities.    Multiple Pain Sites  No         [facility] OT Assessment - 06/03/18 1439      Assessment   Medical Diagnosis  right shoulder pain      Precautions   Precautions  None               OT Treatments/Exercises (OP) - 06/03/18 1440      Exercises  Exercises  Shoulder      Shoulder Exercises: Supine   Protraction  PROM;5 reps;AROM;15 reps    Horizontal ABduction  PROM;5 reps;AROM;15 reps    External Rotation  PROM;5 reps;AROM;15 reps    Internal Rotation  PROM;5 reps;AROM;15 reps    Flexion  PROM;5 reps;AROM;15 reps    ABduction  PROM;5 reps;AROM;15 reps      Shoulder Exercises: Standing   Protraction  AROM;20 reps    Horizontal ABduction  AROM;20 reps    External Rotation  AROM;20 reps    Internal Rotation  AROM;20 reps    Flexion  AROM;20 reps    ABduction  AROM;20 reps    Extension  Theraband;15 reps    Theraband Level (Shoulder Extension)  Level 2 (Red)    Row  Theraband;15 reps    Theraband Level (Shoulder Row)  Level 2 (Red)    Retraction  Theraband;15 reps    Theraband Level (Shoulder Retraction)  Level 2 (Red)      Shoulder Exercises: ROM/Strengthening   UBE (Upper Arm Bike)  Level 2; 3' forward; 3' reverse    X to V Arms  20X    Proximal Shoulder Strengthening, Seated  15X    Ball on Wall  1' flexion; 1' abduction        Manual Therapy   Manual Therapy  Soft tissue mobilization    Manual therapy comments  Manual therapy completed prior to exercises.    Soft tissue mobilization  Myofascial release and manual stretching completed to right upper arm, trapezius, and scapularis region to decrease fascial restrictions and increase ROM in a pain free zone.              OT Short Term Goals - 05/18/18 1619      OT SHORT TERM GOAL #1   Title  Patient will be  educated and independent with HEP to facilitate progress with therapy and increase ability to return to using RUE as dominant.    Time  3    Period  Weeks    Status  On-going      OT SHORT TERM GOAL #2   Title  Patient will report a decreased pain level of 4/10 when completing daily tasks using RUE.     Time  3    Period  Weeks    Status  On-going      OT SHORT TERM GOAL #3   Title  Patient will decrease fascial restrictions to min amount in RUE in order to increase functional mobility and decrease pain level when reaching overhead.     Time  3    Period  Weeks    Status  On-going        OT Long Term Goals - 05/18/18 1619      OT LONG TERM GOAL #1   Title  patient will return to highest level of independence while using his RUE for all daily and leisure tasks as his dominant extremity.     Time  6    Period  Weeks    Status  On-going      OT LONG TERM GOAL #2   Title  Patient will report a decrease level of pain and discomfort of approximately 3/10 when completing daily tasks using his RUE.     Time  6    Period  Weeks    Status  On-going      OT LONG TERM GOAL #3   Title  Patient will decrease fascial  restrictions to trace amount in his right UE to increase functional mobilty and functional reaching ability.     Time  6    Period  Weeks    Status  On-going      OT LONG TERM GOAL #4   Title  Patient will increase RUE A/ROM to WNL in order to be able to reach overhead and out to the side with less difficulty.     Time  6     Period  Weeks    Status  On-going      OT LONG TERM GOAL #5   Title  Patient will increase RUE strength to 5/5 in order to return to completing normal lifting tasks without difficulty.     Time  6    Period  Weeks    Status  On-going            Plan - 06/03/18 1543    Clinical Impression Statement  A: Continued with manual therapy for fascial restrictions in upper arm and scapularis regions. Pt continued shoulder exercises, increasing repetitions. Standing exercises were added to routine to work on improving ROM and strength in RUE. Verbal cueing provided for form and technique during session.     Plan  P: Continue manual therapy for myofascial restrictions. Add 1# weight to shoulder exercises. Update HEP to include exercises to progress strength.        Patient will benefit from skilled therapeutic intervention in order to improve the following deficits and impairments:  Decreased range of motion, Increased fascial restrictions, Pain, Impaired UE functional use, Decreased strength  Visit Diagnosis: Chronic right shoulder pain    Problem List Patient Active Problem List   Diagnosis Date Noted  . Abdominal pain 08/04/2017  . Constipation 01/11/2017  . History of colonic polyps 01/11/2017  . Right shoulder pain 12/03/2015  . Muscle weakness (generalized) 07/02/2014  . Decreased range of motion of right shoulder 07/02/2014  . Rotator cuff impingement syndrome of right shoulder 06/12/2014  . Coronary atherosclerosis of native coronary artery 09/26/2013  . Tobacco abuse 09/26/2013  . Essential hypertension, benign 09/25/2013    Vincente Liberty, OT Student 06/03/2018, 5:28 PM  [facility] [address] [place], [place], [address] Phone: [phone]   Fax:  [phone]  Name: Peter Campbell MRN: [id number] Date of Birth: 26-Feb-1956

## 2018-06-06 ENCOUNTER — Telehealth (HOSPITAL_COMMUNITY): Payer: Self-pay | Admitting: Internal Medicine

## 2018-06-06 NOTE — Telephone Encounter (Signed)
06/06/18  left a messge to move this appt to Beth's schedule at 3:15

## 2018-06-07 ENCOUNTER — Encounter (HOSPITAL_COMMUNITY): Payer: Medicare Other

## 2018-06-07 ENCOUNTER — Ambulatory Visit (HOSPITAL_COMMUNITY): Payer: Medicare Other | Admitting: Specialist

## 2018-06-07 ENCOUNTER — Encounter (HOSPITAL_COMMUNITY): Payer: Self-pay | Admitting: Specialist

## 2018-06-07 DIAGNOSIS — M25511 Pain in right shoulder: Principal | ICD-10-CM

## 2018-06-07 DIAGNOSIS — M25611 Stiffness of right shoulder, not elsewhere classified: Secondary | ICD-10-CM

## 2018-06-07 DIAGNOSIS — R29898 Other symptoms and signs involving the musculoskeletal system: Secondary | ICD-10-CM | POA: Diagnosis not present

## 2018-06-07 DIAGNOSIS — G8929 Other chronic pain: Secondary | ICD-10-CM

## 2018-06-07 NOTE — Therapy (Signed)
[facility] [address] [place], [place], [address] Phone: [phone]   Fax:  [phone]  Occupational Therapy Treatment  Patient Details  Name: Peter Campbell MRN: [id number] Date of Birth: Jan 26, 1956 Referring Provider: Dr. Darreld Mclean   Encounter Date: 06/07/2018  OT End of Session - 06/07/18 1620    Visit Number  6    Number of Visits  12    Date for OT Re-Evaluation  06/24/18    Authorization Type  1) Medicare 2) Medicaid    OT Start Time  1522    OT Stop Time  1600    OT Time Calculation (min)  38 min    Activity Tolerance  Patient tolerated treatment well    Behavior During Therapy  [facility] for tasks assessed/performed       Past Medical History:  Diagnosis Date  . Asthma   . Chronic back pain   . Collagen vascular disease (HCC)   . Colonic mass    History, s/p removal; per patient.  . Coronary atherosclerosis of native coronary artery    Dense coronary atherosclerosis by chest CT November 2014   . Essential hypertension, benign   . Essential tremor   . History of chicken pox   . History of [place] measles   . Sciatica     Past Surgical History:  Procedure Laterality Date  . ABDOMINAL SURGERY    . CIRCUMCISION    . COLON SURGERY     for mass removal  . COLONOSCOPY WITH PROPOFOL N/A 05/03/2017   Procedure: COLONOSCOPY WITH PROPOFOL;  Surgeon: Corbin Ade, MD;  [facility];  Service: Endoscopy;  Laterality: N/A;  1015  . POLYPECTOMY  05/03/2017   Procedure: POLYPECTOMY;  Surgeon: Corbin Ade, MD;  [facility];  Service: Endoscopy;;  descending colon and rectal  . SHOULDER ACROMIOPLASTY Right 06/12/2014   Procedure: RIGHT SHOULDER ACROMIOPLASTY;  Surgeon: Darreld Mclean, MD;  Location: [facility];  Service: Orthopedics;  Laterality: Right;  . SHOULDER OPEN ROTATOR CUFF REPAIR Right 06/12/2014   Procedure: OPEN REPAIR ROTATOR CUFF RIGHT ;  Surgeon: Darreld Mclean, MD;  Location: [facility];  Service:  Orthopedics;  Laterality: Right;  . TONSILLECTOMY      There were no vitals filed for this visit.  Subjective Assessment - 06/07/18 1620    Subjective   S:  It stays sore.      Currently in Pain?  Yes    Pain Score  7     Pain Location  Shoulder    Pain Orientation  Right    Pain Descriptors / Indicators  Aching         [facility] OT Assessment - 06/07/18 0001      Assessment   Medical Diagnosis  right shoulder pain      Precautions   Precautions  None               OT Treatments/Exercises (OP) - 06/07/18 0001      Exercises   Exercises  Shoulder      Shoulder Exercises: Supine   Protraction  PROM;5 reps;Strengthening;10 reps    Protraction Weight (lbs)  1    Horizontal ABduction  PROM;5 reps;Strengthening;10 reps    Horizontal ABduction Weight (lbs)  1    External Rotation  PROM;5 reps;Strengthening;10 reps    External Rotation Weight (lbs)  1    Internal Rotation  PROM;5 reps;Strengthening;10 reps    Internal Rotation Weight (lbs)  1    Flexion  PROM;5 reps;Strengthening;10 reps    Shoulder Flexion Weight (lbs)  1    ABduction  PROM;5 reps;Strengthening;10 reps    Shoulder ABduction Weight (lbs)  1      Shoulder Exercises: Seated   Protraction  Strengthening;10 reps    Protraction Weight (lbs)  1    Horizontal ABduction  Strengthening;10 reps    Horizontal ABduction Weight (lbs)  1    External Rotation  Strengthening;10 reps    External Rotation Weight (lbs)  1    Internal Rotation  Strengthening;10 reps    Internal Rotation Weight (lbs)  1    Flexion  Strengthening;10 reps    Flexion Weight (lbs)  1    Abduction  Strengthening;10 reps    ABduction Weight (lbs)  1      Shoulder Exercises: Standing   External Rotation  Theraband;10 reps    Theraband Level (Shoulder External Rotation)  Level 3 (Green)    Internal Rotation  Theraband;10 reps    Theraband Level (Shoulder Internal Rotation)  Level 3 (Green)    Extension  Theraband;10 reps    Theraband  Level (Shoulder Extension)  Level 3 (Green)    Row  [employer] reps    Theraband Level (Shoulder Row)  Level 3 (Green)    Retraction  Theraband;10 reps    Theraband Level (Shoulder Retraction)  Level 3 (Green)      Shoulder Exercises: ROM/Strengthening   UBE (Upper Arm Bike)  Level 2; 3' forward; 3' reverse    Proximal Shoulder Strengthening, Supine  10 times with 1 pound    Proximal Shoulder Strengthening, Seated  10 times with 1 pound      Manual Therapy   Manual Therapy  Myofascial release    Manual therapy comments  Manual therapy completed prior to exercises.    Soft tissue mobilization  --    Myofascial Release  Myofascial release and manual stretching completed to right upper arm, trapezius, and scapularis region to decrease fascial restrictions and increase ROM in a pain free zone.              OT Education - 06/07/18 1620    Education Details  updated A/ROM exercises to add 1# resistance     Person(s) Educated  Patient    Methods  Explanation;Handout    Comprehension  Verbalized understanding;Returned demonstration       OT Short Term Goals - 05/18/18 1619      OT SHORT TERM GOAL #1   Title  Patient will be  educated and independent with HEP to facilitate progress with therapy and increase ability to return to using RUE as dominant.    Time  3    Period  Weeks    Status  On-going      OT SHORT TERM GOAL #2   Title  Patient will report a decreased pain level of 4/10 when completing daily tasks using RUE.     Time  3    Period  Weeks    Status  On-going      OT SHORT TERM GOAL #3   Title  Patient will decrease fascial restrictions to min amount in RUE in order to increase functional mobility and decrease pain level when reaching overhead.     Time  3    Period  Weeks    Status  On-going        OT Long Term Goals - 05/18/18 1619      OT  LONG TERM GOAL #1   Title  patient will return to highest level of independence while using his RUE for all daily  and leisure tasks as his dominant extremity.     Time  6    Period  Weeks    Status  On-going      OT LONG TERM GOAL #2   Title  Patient will report a decrease level of pain and discomfort of approximately 3/10 when completing daily tasks using his RUE.     Time  6    Period  Weeks    Status  On-going      OT LONG TERM GOAL #3   Title  Patient will decrease fascial restrictions to trace amount in his right UE to increase functional mobilty and functional reaching ability.     Time  6    Period  Weeks    Status  On-going      OT LONG TERM GOAL #4   Title  Patient will increase RUE A/ROM to WNL in order to be able to reach overhead and out to the side with less difficulty.     Time  6    Period  Weeks    Status  On-going      OT LONG TERM GOAL #5   Title  Patient will increase RUE strength to 5/5 in order to return to completing normal lifting tasks without difficulty.     Time  6    Period  Weeks    Status  On-going            Plan - 06/07/18 1621    Clinical Impression Statement  A:  Patient performing A/ROM well and WFL, therefore, added 1# in supine and seated this date.  Patient requires verbal cuing for techinque and to slow speed on descent with PRE exercises.     Plan  P:  follow up on strengthening HEP, add x to v and w arms.        Patient will benefit from skilled therapeutic intervention in order to improve the following deficits and impairments:  Decreased range of motion, Increased fascial restrictions, Pain, Impaired UE functional use, Decreased strength  Visit Diagnosis: Chronic right shoulder pain  Other symptoms and signs involving the musculoskeletal system  Stiffness of right shoulder, not elsewhere classified    Problem List Patient Active Problem List   Diagnosis Date Noted  . Abdominal pain 08/04/2017  . Constipation 01/11/2017  . History of colonic polyps 01/11/2017  . Right shoulder pain 12/03/2015  . Muscle weakness (generalized)  07/02/2014  . Decreased range of motion of right shoulder 07/02/2014  . Rotator cuff impingement syndrome of right shoulder 06/12/2014  . Coronary atherosclerosis of native coronary artery 09/26/2013  . Tobacco abuse 09/26/2013  . Essential hypertension, benign 09/25/2013    Shirlean Mylar, MHA, OTR/L [phone]  06/07/2018, 4:27 PM  [facility] [address] [place], [place], [address] Phone: [phone]   Fax:  [phone]  Name: Peter Campbell MRN: [id number] Date of Birth: March 18, 1956

## 2018-06-07 NOTE — Patient Instructions (Signed)

## 2018-06-08 ENCOUNTER — Other Ambulatory Visit: Payer: Self-pay | Admitting: Orthopaedic Surgery

## 2018-06-09 ENCOUNTER — Ambulatory Visit (HOSPITAL_COMMUNITY): Payer: Medicare Other | Admitting: Occupational Therapy

## 2018-06-09 ENCOUNTER — Encounter (HOSPITAL_COMMUNITY): Payer: Self-pay | Admitting: Occupational Therapy

## 2018-06-09 DIAGNOSIS — M25511 Pain in right shoulder: Principal | ICD-10-CM

## 2018-06-09 DIAGNOSIS — R29898 Other symptoms and signs involving the musculoskeletal system: Secondary | ICD-10-CM

## 2018-06-09 DIAGNOSIS — M25611 Stiffness of right shoulder, not elsewhere classified: Secondary | ICD-10-CM

## 2018-06-09 DIAGNOSIS — G8929 Other chronic pain: Secondary | ICD-10-CM

## 2018-06-09 NOTE — Therapy (Signed)
[facility] [address] [place], [place], [address] Phone: [phone]   Fax:  [phone]  Occupational Therapy Treatment  Patient Details  Name: Peter Campbell MRN: [id number] Date of Birth: Jul 27, 1956 Referring Provider: Dr. Darreld Mclean   Encounter Date: 06/09/2018  OT End of Session - 06/09/18 1511    Visit Number  7    Number of Visits  12    Date for OT Re-Evaluation  06/24/18    Authorization Type  1) Medicare 2) Medicaid    Activity Tolerance  Patient tolerated treatment well    Behavior During Therapy  [facility] for tasks assessed/performed       Past Medical History:  Diagnosis Date  . Asthma   . Chronic back pain   . Collagen vascular disease (HCC)   . Colonic mass    History, s/p removal; per patient.  . Coronary atherosclerosis of native coronary artery    Dense coronary atherosclerosis by chest CT November 2014   . Essential hypertension, benign   . Essential tremor   . History of chicken pox   . History of [place] measles   . Sciatica     Past Surgical History:  Procedure Laterality Date  . ABDOMINAL SURGERY    . CIRCUMCISION    . COLON SURGERY     for mass removal  . COLONOSCOPY WITH PROPOFOL N/A 05/03/2017   Procedure: COLONOSCOPY WITH PROPOFOL;  Surgeon: Corbin Ade, MD;  [facility];  Service: Endoscopy;  Laterality: N/A;  1015  . POLYPECTOMY  05/03/2017   Procedure: POLYPECTOMY;  Surgeon: Corbin Ade, MD;  [facility];  Service: Endoscopy;;  descending colon and rectal  . SHOULDER ACROMIOPLASTY Right 06/12/2014   Procedure: RIGHT SHOULDER ACROMIOPLASTY;  Surgeon: Darreld Mclean, MD;  Location: [facility];  Service: Orthopedics;  Laterality: Right;  . SHOULDER OPEN ROTATOR CUFF REPAIR Right 06/12/2014   Procedure: OPEN REPAIR ROTATOR CUFF RIGHT ;  Surgeon: Darreld Mclean, MD;  Location: [facility];  Service: Orthopedics;  Laterality: Right;  . TONSILLECTOMY      There were no vitals filed  for this visit.  Subjective Assessment - 06/09/18 1429    Subjective   S: I'm really hurting today and I'm not sure why.     Currently in Pain?  Yes    Pain Score  9     Pain Location  Shoulder    Pain Orientation  Right    Pain Descriptors / Indicators  Sore;Aching;Sharp;Constant    Pain Type  Chronic pain    Pain Radiating Towards  none    Pain Onset  More than a month ago    Pain Frequency  Constant    Aggravating Factors   weather, movement    Pain Relieving Factors  medication, heat    Effect of Pain on Daily Activities  mod effect on daily tasks    Multiple Pain Sites  No         [facility] OT Assessment - 06/09/18 1429      Assessment   Medical Diagnosis  right shoulder pain      Precautions   Precautions  None               OT Treatments/Exercises (OP) - 06/09/18 1429      Exercises   Exercises  Shoulder      Shoulder Exercises: Supine   Protraction  PROM;5 reps;AROM;15 reps    Horizontal ABduction  PROM;5  reps;AROM;15 reps    External Rotation  PROM;5 reps;AROM;15 reps    Internal Rotation  PROM;5 reps;AROM;15 reps    Flexion  PROM;5 reps;AROM;15 reps    ABduction  PROM;5 reps;AROM;15 reps      Shoulder Exercises: Sidelying   External Rotation  AROM;12 reps    Internal Rotation  AROM;12 reps    Flexion  AROM;12 reps    ABduction  AROM;12 reps    Other Sidelying Exercises  protraction, A/ROM, 12X    Other Sidelying Exercises  Horizontal abduction, A/ROM, 12X      Shoulder Exercises: Stretch   Corner Stretch  2 reps;10 seconds    Wall Stretch - Flexion  2 reps;10 seconds    Other Shoulder Stretches  pectoralis stretch on wall, 2 reps, 10" each      Modalities   Modalities  Electrical Stimulation      Electrical Stimulation   Electrical Stimulation Location  right shoulder    Electrical Stimulation Action  interferential    Electrical Stimulation Goals  Pain      Manual Therapy   Manual Therapy  Myofascial release    Manual therapy comments   Manual therapy completed prior to exercises.    Myofascial Release  Myofascial release and manual stretching completed to right upper arm, trapezius, and scapularis region to decrease fascial restrictions and increase ROM in a pain free zone.                OT Short Term Goals - 05/18/18 1619      OT SHORT TERM GOAL #1   Title  Patient will be  educated and independent with HEP to facilitate progress with therapy and increase ability to return to using RUE as dominant.    Time  3    Period  Weeks    Status  On-going      OT SHORT TERM GOAL #2   Title  Patient will report a decreased pain level of 4/10 when completing daily tasks using RUE.     Time  3    Period  Weeks    Status  On-going      OT SHORT TERM GOAL #3   Title  Patient will decrease fascial restrictions to min amount in RUE in order to increase functional mobility and decrease pain level when reaching overhead.     Time  3    Period  Weeks    Status  On-going        OT Long Term Goals - 05/18/18 1619      OT LONG TERM GOAL #1   Title  patient will return to highest level of independence while using his RUE for all daily and leisure tasks as his dominant extremity.     Time  6    Period  Weeks    Status  On-going      OT LONG TERM GOAL #2   Title  Patient will report a decrease level of pain and discomfort of approximately 3/10 when completing daily tasks using his RUE.     Time  6    Period  Weeks    Status  On-going      OT LONG TERM GOAL #3   Title  Patient will decrease fascial restrictions to trace amount in his right UE to increase functional mobilty and functional reaching ability.     Time  6    Period  Weeks    Status  On-going  OT LONG TERM GOAL #4   Title  Patient will increase RUE A/ROM to WNL in order to be able to reach overhead and out to the side with less difficulty.     Time  6    Period  Weeks    Status  On-going      OT LONG TERM GOAL #5   Title  Patient will increase  RUE strength to 5/5 in order to return to completing normal lifting tasks without difficulty.     Time  6    Period  Weeks    Status  On-going            Plan - 06/09/18 1511    Clinical Impression Statement  A: Pt reporting significant pain today, unsure what is causing the pain. Pt with moderate fascial restrictions and several muscle knots palpated at deltoid, pectoralis, trapezius, and scapularis regions today. Pt grimacing during exercises, therefore only completed A/ROM due to high pain level, adding sidelying for scapular stability. Pt continues to have ROM WFL. Did not add x to v arms or w arms due to pt pain level. ES utilized at end of session for pain management. Pt reporting pain at  6/10 at end of session.     Plan  P: Resume strengthening if pt able to tolerate and pain level is decreased. Add x to v arms and w arms.        Patient will benefit from skilled therapeutic intervention in order to improve the following deficits and impairments:  Decreased range of motion, Increased fascial restrictions, Pain, Impaired UE functional use, Decreased strength  Visit Diagnosis: Chronic right shoulder pain  Other symptoms and signs involving the musculoskeletal system  Stiffness of right shoulder, not elsewhere classified    Problem List Patient Active Problem List   Diagnosis Date Noted  . Abdominal pain 08/04/2017  . Constipation 01/11/2017  . History of colonic polyps 01/11/2017  . Right shoulder pain 12/03/2015  . Muscle weakness (generalized) 07/02/2014  . Decreased range of motion of right shoulder 07/02/2014  . Rotator cuff impingement syndrome of right shoulder 06/12/2014  . Coronary atherosclerosis of native coronary artery 09/26/2013  . Tobacco abuse 09/26/2013  . Essential hypertension, benign 09/25/2013   Ezra SitesLeslie Jahzier Villalon, OTR/L  [phone] 06/09/2018, 4:20 PM  [facility] [address] [place], [place],  [id number] Phone: [phone]   Fax:  [phone]  Name: Peter Campbell MRN: [id number] Date of Birth: 10/20/1955

## 2018-06-14 ENCOUNTER — Encounter (HOSPITAL_COMMUNITY): Payer: Self-pay

## 2018-06-14 ENCOUNTER — Ambulatory Visit (HOSPITAL_COMMUNITY): Payer: Medicare Other

## 2018-06-14 DIAGNOSIS — R29898 Other symptoms and signs involving the musculoskeletal system: Secondary | ICD-10-CM

## 2018-06-14 DIAGNOSIS — M25511 Pain in right shoulder: Principal | ICD-10-CM

## 2018-06-14 DIAGNOSIS — M25611 Stiffness of right shoulder, not elsewhere classified: Secondary | ICD-10-CM

## 2018-06-14 DIAGNOSIS — G8929 Other chronic pain: Secondary | ICD-10-CM

## 2018-06-14 NOTE — Therapy (Addendum)
[place], [place], [address] Phone: [phone]   Fax:  [phone]  Occupational Therapy Treatment  Patient Details  Name: Peter Campbell MRN: [id number] Date of Birth: [birth year] Referring Provider: Dr. [name]   Progress Note Reporting Period 05/13/2018 to 06/14/2018  See note below for Objective Data and Assessment of Progress/Goals.       Encounter Date: 06/14/2018  OT End of Session - 06/14/18 1505    Visit Number  8    Number of Visits  12    Date for OT Re-Evaluation  06/24/18    Authorization Type  1) Medicare 2) Medicaid    OT Start Time  1435   reassessment   OT Stop Time  1520    OT Time Calculation (min)  45 min    Activity Tolerance  Patient tolerated treatment well    Behavior During Therapy  WFL for tasks assessed/performed       Past Medical History:  Diagnosis Date  . Asthma   . Chronic back pain   . Collagen vascular disease ([place])   . Colonic mass    History, s/p removal; per patient.  . Coronary atherosclerosis of native coronary artery    Dense coronary atherosclerosis by chest CT November 2014   . Essential hypertension, benign   . Essential tremor   . History of chicken pox   . History of [place] measles   . Sciatica     Past Surgical History:  Procedure Laterality Date  . ABDOMINAL SURGERY    . CIRCUMCISION    . COLON SURGERY     for mass removal  . COLONOSCOPY WITH PROPOFOL N/A 05/03/2017   Procedure: COLONOSCOPY WITH PROPOFOL;  Surgeon: Daneil Dolin, MD;  [facility];  Service: Endoscopy;  Laterality: N/A;  1015  . POLYPECTOMY  05/03/2017   Procedure: POLYPECTOMY;  Surgeon: Daneil Dolin, MD;  [facility];  Service: Endoscopy;;  descending colon and rectal  . SHOULDER ACROMIOPLASTY Right 06/12/2014   Procedure: RIGHT SHOULDER ACROMIOPLASTY;  Surgeon: [name], MD;  Location: [facility];  Service: Orthopedics;  Laterality: Right;  .  SHOULDER OPEN ROTATOR CUFF REPAIR Right 06/12/2014   Procedure: OPEN REPAIR ROTATOR CUFF RIGHT ;  Surgeon: [name], MD;  Location: [facility];  Service: Orthopedics;  Laterality: Right;  . TONSILLECTOMY      There were no vitals filed for this visit.  Subjective Assessment - 06/14/18 1453    Subjective   S: I think I see the doctor again tomorrow for another shot.     Currently in Pain?  Yes    Pain Score  7     Pain Location  Shoulder    Pain Orientation  Right    Pain Descriptors / Indicators  Sore;Constant;Sharp    Pain Type  Chronic pain         [facility] OT Assessment - 06/14/18 1501      Assessment   Medical Diagnosis  right shoulder pain      Precautions   Precautions  None      ROM / Strength   AROM / PROM / Strength  AROM;Strength;PROM      AROM   Overall AROM Comments  Assessed seated. IR/er abducted.     AROM Assessment Site  Shoulder    Right/Left Shoulder  Right    Right Shoulder Flexion  135 Degrees   previous: same   Right Shoulder  ABduction  111 Degrees   previous: 125   Right Shoulder Internal Rotation  55 Degrees   previous: 50   Right Shoulder External Rotation  61 Degrees   previous: 50     PROM   Overall PROM   Within functional limits for tasks performed      Strength   Overall Strength Comments  Assessed seated. IR/er adducted    Strength Assessment Site  Shoulder    Right/Left Shoulder  Right    Right Shoulder Flexion  4/5   previous: 4-/5   Right Shoulder ABduction  4/5   previous: 4-/5   Right Shoulder Internal Rotation  4+/5   previous: 4/5   Right Shoulder External Rotation  4/5   previous: 3+/5              OT Treatments/Exercises (OP) - 06/14/18 1502      Exercises   Exercises  Shoulder      Shoulder Exercises: Supine   Protraction  PROM;5 reps;AROM;15 reps    Horizontal ABduction  PROM;5 reps;AROM;15 reps    External Rotation  PROM;5 reps;AROM;15 reps    Internal Rotation  PROM;5 reps;AROM;15 reps    Flexion   PROM;5 reps;AROM;15 reps    ABduction  PROM;5 reps;AROM;15 reps      Shoulder Exercises: Standing   Protraction  Strengthening;10 reps    Protraction Weight (lbs)  1    Horizontal ABduction  Strengthening;10 reps    Horizontal ABduction Weight (lbs)  1    External Rotation  Strengthening;10 reps    External Rotation Weight (lbs)  1    Internal Rotation  Strengthening;10 reps    Internal Rotation Weight (lbs)  1    Flexion  Strengthening;10 reps    Shoulder Flexion Weight (lbs)  1    ABduction  Strengthening;10 reps    Shoulder ABduction Weight (lbs)  1    Other Standing Exercises  Y arms no lift off; 10X      Manual Therapy   Manual Therapy  Myofascial release    Manual therapy comments  Manual therapy completed prior to exercises.    Soft tissue mobilization  Myofascial release and manual stretching completed to right upper arm, trapezius, and scapularis region to decrease fascial restrictions and increase ROM in a pain free zone.              OT Education - 06/14/18 1533    Education Details  Pt was educated on self myofascial release using ball.    Person(s) Educated  Patient    Methods  Explanation;Demonstration;Verbal cues;Tactile cues    Comprehension  Returned demonstration;Verbalized understanding       OT Short Term Goals - 06/14/18 1535      OT SHORT TERM GOAL #1   Title  Patient will be  educated and independent with HEP to facilitate progress with therapy and increase ability to return to using RUE as dominant.    Time  3    Period  Weeks    Status  Achieved      OT SHORT TERM GOAL #2   Title  Patient will report a decreased pain level of 4/10 when completing daily tasks using RUE.     Time  3    Period  Weeks    Status  On-going      OT SHORT TERM GOAL #3   Title  Patient will decrease fascial restrictions to min amount in RUE in order to increase functional mobility and  decrease pain level when reaching overhead.     Time  3    Period  Weeks     Status  Achieved        OT Long Term Goals - 06/14/18 1535      OT LONG TERM GOAL #1   Title  patient will return to highest level of independence while using his RUE for all daily and leisure tasks as his dominant extremity.     Time  6    Period  Weeks    Status  On-going      OT LONG TERM GOAL #2   Title  Patient will report a decrease level of pain and discomfort of approximately 3/10 when completing daily tasks using his RUE.     Time  6    Period  Weeks    Status  On-going      OT LONG TERM GOAL #3   Title  Patient will decrease fascial restrictions to trace amount in his right UE to increase functional mobilty and functional reaching ability.     Time  6    Period  Weeks    Status  On-going      OT LONG TERM GOAL #4   Title  Patient will increase RUE A/ROM to WNL in order to be able to reach overhead and out to the side with less difficulty.     Time  6    Period  Weeks    Status  On-going      OT LONG TERM GOAL #5   Title  Patient will increase RUE strength to 5/5 in order to return to completing normal lifting tasks without difficulty.     Time  6    Period  Weeks    Status  On-going            Plan - 06/14/18 1535    Clinical Impression Statement  A: Patient presents with pain in his right shoulder anterior region proximal to [facility] joint and the medial border of his right scapula with min fascial restrictions palpated. Completed trigger point release to mentioned areas. patient was educated on self myofascial release using ball with returned demonstration. ROM and strength assessed this date for MD appointment. A/ROM for IR/er has increased only. Patient's A/ROM for shoulder flexion has remained the same as abduction has decreased. Strength for flexion and abduction have decreased while IR/er have increased. 2/3 STGs met at this point. Therapy sessions have been modified depending on pain level as patient initially had decreased pain with first injection followed  by increased pain soon after.     Plan  P: Follow up on MD appointmnet. Add X to V arms no weight.     Consulted and Agree with Plan of Care  Patient       Patient will benefit from skilled therapeutic intervention in order to improve the following deficits and impairments:  Decreased range of motion, Increased fascial restrictions, Pain, Impaired UE functional use, Decreased strength  Visit Diagnosis: Chronic right shoulder pain  Other symptoms and signs involving the musculoskeletal system  Stiffness of right shoulder, not elsewhere classified    Problem List Patient Active Problem List   Diagnosis Date Noted  . Abdominal pain 08/04/2017  . Constipation 01/11/2017  . History of colonic polyps 01/11/2017  . Right shoulder pain 12/03/2015  . Muscle weakness (generalized) 07/02/2014  . Decreased range of motion of right shoulder 07/02/2014  . Rotator cuff impingement syndrome of right  shoulder 06/12/2014  . Coronary atherosclerosis of native coronary artery 09/26/2013  . Tobacco abuse 09/26/2013  . Essential hypertension, benign 09/25/2013   Ailene Ravel, OTR/L,CBIS  [phone]  06/14/2018, 3:41 PM  [facility] [address] [place], [place], [address] Phone: [phone]   Fax:  [phone]  Name: Peter Campbell MRN: [id number] Date of Birth: 21-Jul-1956

## 2018-06-15 ENCOUNTER — Telehealth (HOSPITAL_COMMUNITY): Payer: Self-pay | Admitting: Internal Medicine

## 2018-06-15 NOTE — Telephone Encounter (Signed)
06/15/18  pt cx and wanted us to add to the end of his schedule

## 2018-06-16 ENCOUNTER — Ambulatory Visit (HOSPITAL_COMMUNITY): Payer: Medicare Other

## 2018-06-16 ENCOUNTER — Encounter: Payer: Self-pay | Admitting: Orthopaedic Surgery

## 2018-06-16 ENCOUNTER — Ambulatory Visit (INDEPENDENT_AMBULATORY_CARE_PROVIDER_SITE_OTHER): Payer: Medicare Other | Admitting: Orthopaedic Surgery

## 2018-06-16 VITALS — BP 121/79 | HR 68 | Ht 68.0 in | Wt 167.0 lb

## 2018-06-16 DIAGNOSIS — M25511 Pain in right shoulder: Secondary | ICD-10-CM

## 2018-06-16 DIAGNOSIS — G8929 Other chronic pain: Secondary | ICD-10-CM

## 2018-06-16 DIAGNOSIS — F17210 Nicotine dependence, cigarettes, uncomplicated: Secondary | ICD-10-CM

## 2018-06-16 NOTE — Progress Notes (Signed)
PROCEDURE NOTE:  The patient request injection, verbal consent was obtained.  The right shoulder was prepped appropriately after time out was performed.   Sterile technique was observed and injection of 1 cc of Depo-Medrol 40 mg with several cc's of plain xylocaine. Anesthesia was provided by ethyl chloride and a 20-gauge needle was used to inject the shoulder area. A posterior approach was used.  The injection was tolerated well.  A band aid dressing was applied.  The patient was advised to apply ice later today and tomorrow to the injection sight as needed.  He has been to PT and I have read their noted.  Return in one month.  Call if any problem.  Precautions discussed.   Electronically Signed Darreld McleanWayne Theodoros Stjames, MD 9/19/201910:43 AM

## 2018-06-21 ENCOUNTER — Ambulatory Visit (HOSPITAL_COMMUNITY): Payer: Medicare Other | Admitting: Occupational Therapy

## 2018-06-21 ENCOUNTER — Encounter (HOSPITAL_COMMUNITY): Payer: Self-pay | Admitting: Occupational Therapy

## 2018-06-21 DIAGNOSIS — M25611 Stiffness of right shoulder, not elsewhere classified: Secondary | ICD-10-CM

## 2018-06-21 DIAGNOSIS — R29898 Other symptoms and signs involving the musculoskeletal system: Secondary | ICD-10-CM

## 2018-06-21 DIAGNOSIS — M25511 Pain in right shoulder: Principal | ICD-10-CM

## 2018-06-21 DIAGNOSIS — G8929 Other chronic pain: Secondary | ICD-10-CM

## 2018-06-21 NOTE — Therapy (Addendum)
[facility] [address] [place], [place], [address] Phone: [phone]   Fax:  [phone]  Occupational Therapy Treatment  Patient Details  Name: Peter Campbell MRN: [id number] Date of Birth: 03/12/1956 Referring Provider: Dr. Darreld McleanWayne Keeling   Encounter Date: 06/21/2018  OT End of Session - 06/21/18 1525    Visit Number  9    Number of Visits  12    Date for OT Re-Evaluation  06/24/18    Authorization Type  1) Medicare 2) Medicaid    OT Start Time  1431    OT Stop Time  1511    OT Time Calculation (min)  40 min    Activity Tolerance  Patient tolerated treatment well    Behavior During Therapy  [facility] tasks assessed/performed       Past Medical History:  Diagnosis Date  . Asthma   . Chronic back pain   . Collagen vascular disease ([facility])   . Colonic mass    History, s/p removal; per patient.  . Coronary atherosclerosis of native coronary artery    Dense coronary atherosclerosis by chest CT November 2014   . Essential hypertension, benign   . Essential tremor   . History of chicken pox   . History of [place] measles   . Sciatica     Past Surgical History:  Procedure Laterality Date  . ABDOMINAL SURGERY    . CIRCUMCISION    . COLON SURGERY     for mass removal  . COLONOSCOPY WITH PROPOFOL N/A 05/03/2017   Procedure: COLONOSCOPY WITH PROPOFOL;  Surgeon: Corbin Adeourk, Robert M, MD;  [facility];  Service: Endoscopy;  Laterality: N/A;  1015  . POLYPECTOMY  05/03/2017   Procedure: POLYPECTOMY;  Surgeon: Corbin Adeourk, Robert M, MD;  [facility];  Service: Endoscopy;;  descending colon and rectal  . SHOULDER ACROMIOPLASTY Right 06/12/2014   Procedure: RIGHT SHOULDER ACROMIOPLASTY;  Surgeon: Darreld McleanWayne Keeling, MD;  Location: [facility];  Service: Orthopedics;  Laterality: Right;  . SHOULDER OPEN ROTATOR CUFF REPAIR Right 06/12/2014   Procedure: OPEN REPAIR ROTATOR CUFF RIGHT ;  Surgeon: Darreld McleanWayne Keeling, MD;  Location: [facility];  Service:  Orthopedics;  Laterality: Right;  . TONSILLECTOMY      There were no vitals filed for this visit.  Subjective Assessment - 06/21/18 1430    Subjective   S: The injection helped, but it's still a 5.     Currently in Pain?  Yes    Pain Score  5     Pain Location  Shoulder    Pain Orientation  Right    Pain Radiating Towards  N/A    Pain Onset  In the past 7 days    Pain Frequency  Constant    Multiple Pain Sites  No         [facility] OT Assessment - 06/21/18 0001      Assessment   Medical Diagnosis  right shoulder pain      Precautions   Precautions  None               OT Treatments/Exercises (OP) - 06/21/18 1430      Exercises   Exercises  Shoulder      Shoulder Exercises: Supine   Protraction  PROM;5 reps;Strengthening;10 reps;Weights    Protraction Weight (lbs)  2    Horizontal ABduction  PROM;5 reps;Strengthening;10 reps;Weights    Horizontal ABduction Weight (lbs)  2    External Rotation  PROM;5 reps;Strengthening;10 reps;Weights    External Rotation Weight (lbs)  2    Internal Rotation  PROM;5 reps;Strengthening;10 reps;Weights    Internal Rotation Weight (lbs)  2    Flexion  PROM;5 reps;Strengthening;10 reps;Weights    Shoulder Flexion Weight (lbs)  2    ABduction  PROM;5 reps;Strengthening;10 reps;Weights    Shoulder ABduction Weight (lbs)  2      Shoulder Exercises: Standing   Protraction  AROM;12 reps    Horizontal ABduction  AROM;12 reps    External Rotation  AROM;12 reps    Internal Rotation  AROM;12 reps    Flexion  AROM;12 reps    ABduction  AROM;12 reps    Extension  Theraband;10 reps    Theraband Level (Shoulder Extension)  Level 3 (Green)    Row  [employer] reps    Theraband Level (Shoulder Row)  Level 3 (Green)    Retraction  Theraband;10 reps    Theraband Level (Shoulder Retraction)  Level 3 (Green)      Shoulder Exercises: ROM/Strengthening   UBE (Upper Arm Bike)  Level 4; 3' forward; 3' reverse    X to V Arms  20X    Proximal  Shoulder Strengthening, Seated  10X    Ball on Wall  1' flexion; 1' abduction       Manual Therapy   Manual Therapy  Myofascial release    Manual therapy comments  Manual therapy completed prior to exercises.    Soft tissue mobilization  --    Myofascial Release  Myofascial release and manual stretching completed to right upper arm, trapezius, and scapularis region to decrease fascial restrictions and increase ROM in a pain free zone.                OT Short Term Goals - 06/14/18 1535      OT SHORT TERM GOAL #1   Title  Patient will be  educated and independent with HEP to facilitate progress with therapy and increase ability to return to using RUE as dominant.    Time  3    Period  Weeks    Status  Achieved      OT SHORT TERM GOAL #2   Title  Patient will report a decreased pain level of 4/10 when completing daily tasks using RUE.     Time  3    Period  Weeks    Status  On-going      OT SHORT TERM GOAL #3   Title  Patient will decrease fascial restrictions to min amount in RUE in order to increase functional mobility and decrease pain level when reaching overhead.     Time  3    Period  Weeks    Status  Achieved        OT Long Term Goals - 06/14/18 1535      OT LONG TERM GOAL #1   Title  patient will return to highest level of independence while using his RUE for all daily and leisure tasks as his dominant extremity.     Time  6    Period  Weeks    Status  On-going      OT LONG TERM GOAL #2   Title  Patient will report a decrease level of pain and discomfort of approximately 3/10 when completing daily tasks using his RUE.     Time  6    Period  Weeks    Status  On-going      OT LONG  TERM GOAL #3   Title  Patient will decrease fascial restrictions to trace amount in his right UE to increase functional mobilty and functional reaching ability.     Time  6    Period  Weeks    Status  On-going      OT LONG TERM GOAL #4   Title  Patient will increase RUE  A/ROM to WNL in order to be able to reach overhead and out to the side with less difficulty.     Time  6    Period  Weeks    Status  On-going      OT LONG TERM GOAL #5   Title  Patient will increase RUE strength to 5/5 in order to return to completing normal lifting tasks without difficulty.     Time  6    Period  Weeks    Status  On-going            Plan - 06/21/18 1526    Clinical Impression Statement  A: Introduced X to V arms without weight. Pt progressed to using 2# weights in supine, but required rest breaks due to fatigue and verbal as well as tactile cuing for proper form.        Plan  P: Continue with manual therapy prn. Discontinue supine exercises and progress to standing shoulder exercises with 1# weights. Add therapy ball exercises with 1# wrist weights.     Consulted and Agree with Plan of Care  Patient       Patient will benefit from skilled therapeutic intervention in order to improve the following deficits and impairments:  Decreased range of motion, Increased fascial restrictions, Pain, Impaired UE functional use, Decreased strength  Visit Diagnosis: Chronic right shoulder pain  Other symptoms and signs involving the musculoskeletal system  Stiffness of right shoulder, not elsewhere classified    Problem List Patient Active Problem List   Diagnosis Date Noted  . Abdominal pain 08/04/2017  . Constipation 01/11/2017  . History of colonic polyps 01/11/2017  . Right shoulder pain 12/03/2015  . Muscle weakness (generalized) 07/02/2014  . Decreased range of motion of right shoulder 07/02/2014  . Rotator cuff impingement syndrome of right shoulder 06/12/2014  . Coronary atherosclerosis of native coronary artery 09/26/2013  . Tobacco abuse 09/26/2013  . Essential hypertension, benign 09/25/2013    Vincente Liberty, OT Student  06/21/2018, 5:40 PM  [facility] [address] [place], [place], [address] Phone:  [phone]   Fax:  [phone]  Name: Peter Campbell MRN: [id number] Date of Birth: 1955/10/26

## 2018-06-23 ENCOUNTER — Telehealth (HOSPITAL_COMMUNITY): Payer: Self-pay | Admitting: Internal Medicine

## 2018-06-23 ENCOUNTER — Telehealth: Payer: Self-pay | Admitting: Orthopaedic Surgery

## 2018-06-23 DIAGNOSIS — G8929 Other chronic pain: Secondary | ICD-10-CM

## 2018-06-23 DIAGNOSIS — M25511 Pain in right shoulder: Principal | ICD-10-CM

## 2018-06-23 MED ORDER — HYDROCODONE-ACETAMINOPHEN 5-325 MG PO TABS: ORAL_TABLET | ORAL | 0 refills | Status: DC

## 2018-06-23 NOTE — Telephone Encounter (Signed)
06/23/18  pt is going out of town

## 2018-06-23 NOTE — Telephone Encounter (Signed)
Patient requests refill:  HYDROcodone-acetaminophen (NORCO/VICODIN) 5-325 MG tablet 75 tablet 0   - [employer]

## 2018-06-24 ENCOUNTER — Ambulatory Visit (HOSPITAL_COMMUNITY): Payer: Medicare Other | Admitting: Occupational Therapy

## 2018-06-28 ENCOUNTER — Ambulatory Visit (HOSPITAL_COMMUNITY): Payer: Medicare Other | Attending: Orthopaedic Surgery | Admitting: Occupational Therapy

## 2018-06-28 DIAGNOSIS — G8929 Other chronic pain: Secondary | ICD-10-CM | POA: Diagnosis present

## 2018-06-28 DIAGNOSIS — M25511 Pain in right shoulder: Secondary | ICD-10-CM | POA: Insufficient documentation

## 2018-06-28 DIAGNOSIS — M25611 Stiffness of right shoulder, not elsewhere classified: Secondary | ICD-10-CM | POA: Insufficient documentation

## 2018-06-28 DIAGNOSIS — R29898 Other symptoms and signs involving the musculoskeletal system: Secondary | ICD-10-CM | POA: Diagnosis present

## 2018-06-28 NOTE — Therapy (Addendum)
[facility] [address] [place], [place], [address] Phone: [phone]   Fax:  [phone]  Occupational Therapy Treatment  Patient Details  Name: Peter Campbell MRN: [id number] Date of Birth: 11/02/1955 No data recorded  Encounter Date: 06/28/2018  OT End of Session - 06/28/18 1510    Visit Number  10    Number of Visits  12    Date for OT Re-Evaluation  06/24/18    Authorization Type  1) Medicare 2) Medicaid    OT Start Time  1432    OT Stop Time  1503   Finished session early due to chest pains.   OT Time Calculation (min)  31 min    Activity Tolerance  Patient tolerated treatment well    Behavior During Therapy  WFL for tasks assessed/performed       Past Medical History:  Diagnosis Date  . Asthma   . Chronic back pain   . Collagen vascular disease (HCC)   . Colonic mass    History, s/p removal; per patient.  . Coronary atherosclerosis of native coronary artery    Dense coronary atherosclerosis by chest CT November 2014   . Essential hypertension, benign   . Essential tremor   . History of chicken pox   . History of [place] measles   . Sciatica     Past Surgical History:  Procedure Laterality Date  . ABDOMINAL SURGERY    . CIRCUMCISION    . COLON SURGERY     for mass removal  . COLONOSCOPY WITH PROPOFOL N/A 05/03/2017   Procedure: COLONOSCOPY WITH PROPOFOL;  Surgeon: Corbin Ade, MD;  [facility];  Service: Endoscopy;  Laterality: N/A;  1015  . POLYPECTOMY  05/03/2017   Procedure: POLYPECTOMY;  Surgeon: Corbin Ade, MD;  [facility];  Service: Endoscopy;;  descending colon and rectal  . SHOULDER ACROMIOPLASTY Right 06/12/2014   Procedure: RIGHT SHOULDER ACROMIOPLASTY;  Surgeon: Darreld Mclean, MD;  Location: [facility];  Service: Orthopedics;  Laterality: Right;  . SHOULDER OPEN ROTATOR CUFF REPAIR Right 06/12/2014   Procedure: OPEN REPAIR ROTATOR CUFF RIGHT ;  Surgeon: Darreld Mclean, MD;   Location: [facility];  Service: Orthopedics;  Laterality: Right;  . TONSILLECTOMY      There were no vitals filed for this visit.  Subjective Assessment - 06/28/18 1508    Subjective   S: I'm having more pain today than usual and I'm not sure why.      Currently in Pain?  Yes    Pain Score  7     Pain Location  Shoulder    Pain Orientation  Right    Pain Descriptors / Indicators  Aching    Pain Type  Acute pain    Pain Radiating Towards  N/A    Pain Onset  More than a month ago    Pain Frequency  Intermittent    Aggravating Factors   Movement and use of arm    Pain Relieving Factors  Medication, heat    Effect of Pain on Daily Activities  Mod effect on daily activities    Multiple Pain Sites  No         [facility] OT Assessment - 06/28/18 1434      Assessment   Medical Diagnosis  right shoulder pain      Precautions   Precautions  None               OT  Treatments/Exercises (OP) - 06/28/18 1434      Exercises   Exercises  Shoulder      Shoulder Exercises: Supine   Protraction  PROM;5 reps    Horizontal ABduction  PROM;5 reps    External Rotation  PROM;5 reps    Internal Rotation  PROM;5 reps    Flexion  PROM;5 reps    ABduction  PROM;5 reps      Shoulder Exercises: Standing   Protraction  Strengthening;10 reps    Protraction Weight (lbs)  1    Horizontal ABduction  Strengthening;10 reps    Horizontal ABduction Weight (lbs)  1    External Rotation  Strengthening;10 reps    External Rotation Weight (lbs)  1    Internal Rotation  Strengthening;10 reps    Internal Rotation Weight (lbs)  1    Flexion  Strengthening;10 reps    Shoulder Flexion Weight (lbs)  1    ABduction  Strengthening;10 reps    Shoulder ABduction Weight (lbs)  1      Shoulder Exercises: ROM/Strengthening   X to V Arms  20X    Proximal Shoulder Strengthening, Seated  10X    Ball on Wall  1' flexion; 1' abduction       Manual Therapy   Manual Therapy  Myofascial release    Manual therapy  comments  Manual therapy completed prior to exercises.    Myofascial Release  Myofascial release and manual stretching completed to right upper arm, trapezius, and scapularis region to decrease fascial restrictions and increase ROM in a pain free zone.                OT Short Term Goals - 06/14/18 1535      OT SHORT TERM GOAL #1   Title  Patient will be  educated and independent with HEP to facilitate progress with therapy and increase ability to return to using RUE as dominant.    Time  3    Period  Weeks    Status  Achieved      OT SHORT TERM GOAL #2   Title  Patient will report a decreased pain level of 4/10 when completing daily tasks using RUE.     Time  3    Period  Weeks    Status  On-going      OT SHORT TERM GOAL #3   Title  Patient will decrease fascial restrictions to min amount in RUE in order to increase functional mobility and decrease pain level when reaching overhead.     Time  3    Period  Weeks    Status  Achieved        OT Long Term Goals - 06/14/18 1535      OT LONG TERM GOAL #1   Title  patient will return to highest level of independence while using his RUE for all daily and leisure tasks as his dominant extremity.     Time  6    Period  Weeks    Status  On-going      OT LONG TERM GOAL #2   Title  Patient will report a decrease level of pain and discomfort of approximately 3/10 when completing daily tasks using his RUE.     Time  6    Period  Weeks    Status  On-going      OT LONG TERM GOAL #3   Title  Patient will decrease fascial restrictions to trace amount in his right UE  to increase functional mobilty and functional reaching ability.     Time  6    Period  Weeks    Status  On-going      OT LONG TERM GOAL #4   Title  Patient will increase RUE A/ROM to WNL in order to be able to reach overhead and out to the side with less difficulty.     Time  6    Period  Weeks    Status  On-going      OT LONG TERM GOAL #5   Title  Patient will  increase RUE strength to 5/5 in order to return to completing normal lifting tasks without difficulty.     Time  6    Period  Weeks    Status  On-going            Plan - 06/28/18 1511    Clinical Impression Statement  A: Completed manual therapy with more pronounced fascial restrictions noted today. Continued to work on progressive shoulder strengthening with 1# weight. Pt initially requested E-stem at end of session; however, he then reported chest discomfort and requested to leave early in order to go home and take medication.    Plan  P: Complete reassessment, FOTO, discuss progress and discuss possible discharge and return to MD.     Consulted and Agree with Plan of Care  Patient       Patient will benefit from skilled therapeutic intervention in order to improve the following deficits and impairments:  Decreased range of motion, Increased fascial restrictions, Pain, Impaired UE functional use, Decreased strength  Visit Diagnosis: Chronic right shoulder pain  Stiffness of right shoulder, not elsewhere classified    Problem List Patient Active Problem List   Diagnosis Date Noted  . Abdominal pain 08/04/2017  . Constipation 01/11/2017  . History of colonic polyps 01/11/2017  . Right shoulder pain 12/03/2015  . Muscle weakness (generalized) 07/02/2014  . Decreased range of motion of right shoulder 07/02/2014  . Rotator cuff impingement syndrome of right shoulder 06/12/2014  . Coronary atherosclerosis of native coronary artery 09/26/2013  . Tobacco abuse 09/26/2013  . Essential hypertension, benign 09/25/2013    Vincente Liberty, OT Student  06/28/2018, 3:29 PM   [facility] [address] [place], [place], [address] Phone: [phone]   Fax:  [phone]  Name: AODHAN SCHEIDT MRN: [id number] Date of Birth: 1956/01/01

## 2018-06-30 ENCOUNTER — Ambulatory Visit (HOSPITAL_COMMUNITY): Payer: Medicare Other | Admitting: Occupational Therapy

## 2018-06-30 ENCOUNTER — Encounter (HOSPITAL_COMMUNITY): Payer: Self-pay | Admitting: Occupational Therapy

## 2018-06-30 DIAGNOSIS — M25511 Pain in right shoulder: Secondary | ICD-10-CM | POA: Diagnosis not present

## 2018-06-30 DIAGNOSIS — M25611 Stiffness of right shoulder, not elsewhere classified: Secondary | ICD-10-CM

## 2018-06-30 DIAGNOSIS — R29898 Other symptoms and signs involving the musculoskeletal system: Secondary | ICD-10-CM

## 2018-06-30 DIAGNOSIS — G8929 Other chronic pain: Secondary | ICD-10-CM

## 2018-06-30 NOTE — Patient Instructions (Signed)
Repeat all exercises 10-15 times, 1-2 times per day using 1-2# weights.  1) Shoulder Protraction    Begin with elbows by your side, slowly "punch" straight out in front of you.      2) Shoulder Flexion      Standing:         Begin with arms at your side with thumbs pointed up, slowly raise both arms up and forward towards overhead.               3) Horizontal abduction/adduction     Standing:           Begin with arms straight out in front of you, bring out to the side in at "T" shape. Keep arms straight entire time.                 4) Internal & External Rotation    *No band* -Stand with elbows at the side and elbows bent 90 degrees. Move your forearms away from your body, then bring back inward toward the body.     5) Shoulder Abduction      Standing:       Lying on your back begin with your arms flat on the table next to your side. Slowly move your arms out to the side so that they go overhead, in a jumping jack or snow angel movement.    6) X to V arms (cheerleader move):  Begin with arms straight down, crossed in front of body in an "X". Keeping arms crossed, lift arms straight up overhead. Then spread arms apart into a "V" shape.  Bring back together into x and lower down to starting position.

## 2018-06-30 NOTE — Therapy (Addendum)
This qualified practitioner was present in the room guiding the student in service delivery. Therapy student was participating in the provision of services, and the practitioner was not engaged in treating another patient or doing other tasks at the same time.   Guadelupe Sabin, OTR/L  [phone] 06/30/2018  [place] [address] [place], [place], [address] Phone: [phone]   Fax:  [phone]  Occupational Therapy Treatment  Patient Details  Name: Peter Campbell MRN: [id number] Date of Birth: Jul 29, 1956 Referring Provider (OT): [name], MD   Encounter Date: 06/30/2018  OT End of Session - 06/30/18 1516    Visit Number  11    Number of Visits  12    Date for OT Re-Evaluation  06/24/18    Authorization Type  1) Medicare 2) Medicaid    OT Start Time  0235    OT Stop Time  0315    OT Time Calculation (min)  40 min    Activity Tolerance  Patient tolerated treatment well    Behavior During Therapy  [facility] for tasks assessed/performed       Past Medical History:  Diagnosis Date  . Asthma   . Chronic back pain   . Collagen vascular disease ([place])   . Colonic mass    History, s/p removal; per patient.  . Coronary atherosclerosis of native coronary artery    Dense coronary atherosclerosis by chest CT November 2014   . Essential hypertension, benign   . Essential tremor   . History of chicken pox   . History of [place] measles   . Sciatica     Past Surgical History:  Procedure Laterality Date  . ABDOMINAL SURGERY    . CIRCUMCISION    . COLON SURGERY     for mass removal  . COLONOSCOPY WITH PROPOFOL N/A 05/03/2017   Procedure: COLONOSCOPY WITH PROPOFOL;  Surgeon: Daneil Dolin, MD;  [facility];  Service: Endoscopy;  Laterality: N/A;  1015  . POLYPECTOMY  05/03/2017   Procedure: POLYPECTOMY;  Surgeon: Daneil Dolin, MD;  [facility];  Service: Endoscopy;;  descending colon and rectal  .  SHOULDER ACROMIOPLASTY Right 06/12/2014   Procedure: RIGHT SHOULDER ACROMIOPLASTY;  Surgeon: [name], MD;  Location: [facility];  Service: Orthopedics;  Laterality: Right;  . SHOULDER OPEN ROTATOR CUFF REPAIR Right 06/12/2014   Procedure: OPEN REPAIR ROTATOR CUFF RIGHT ;  Surgeon: [name], MD;  Location: [facility];  Service: Orthopedics;  Laterality: Right;  . TONSILLECTOMY      There were no vitals filed for this visit.  Subjective Assessment - 06/30/18 1528    Subjective   S: I did my exercises before I came here today.     Special Tests  FOTO: 45/100    Currently in Pain?  No/denies    Pain Score  0-No pain         [facility] OT Assessment - 06/30/18 1434      Assessment   Medical Diagnosis  right shoulder pain      Precautions   Precautions  None      Cognition   Overall Cognitive Status  Within Functional Limits for tasks assessed      Observation/Other Assessments   Focus on Therapeutic Outcomes (FOTO)   45/100   previous 49/100     ROM / Strength   AROM / PROM / Strength  AROM;PROM;Strength      AROM  Overall AROM Comments  Assessed seated. IR/er abducted.     AROM Assessment Site  Shoulder    Right/Left Shoulder  Right    Right Shoulder Flexion  145 Degrees   previous 135   Right Shoulder ABduction  120 Degrees   previous 111   Right Shoulder Internal Rotation  65 Degrees   previous 55   Right Shoulder External Rotation  70 Degrees   prvious 61      PROM   Overall PROM   Within functional limits for tasks performed      Strength   Overall Strength Comments  Assessed seated. IR/er adducted    Strength Assessment Site  Shoulder    Right/Left Shoulder  Right    Right Shoulder Flexion  4/5   previous 4/5   Right Shoulder ABduction  4/5   previous 4/5   Right Shoulder Internal Rotation  4+/5   previous 4+/5   Right Shoulder External Rotation  4/5   previous 4/5              OT Treatments/Exercises (OP) - 06/30/18 1447      Exercises    Exercises  Shoulder      Shoulder Exercises: Supine   Protraction  PROM;5 reps    Horizontal ABduction  PROM;5 reps    External Rotation  PROM;5 reps    Internal Rotation  PROM;5 reps    Flexion  PROM;5 reps    ABduction  PROM;5 reps      Shoulder Exercises: Seated   Elevation  AROM;15 reps    Extension  AROM;15 reps    Retraction  AROM;15 reps    Protraction  Strengthening;10 reps    Protraction Weight (lbs)  1    Horizontal ABduction  Strengthening;10 reps    Horizontal ABduction Weight (lbs)  1    External Rotation  Strengthening;10 reps    External Rotation Weight (lbs)  1    Internal Rotation  Strengthening;10 reps    Internal Rotation Weight (lbs)  1    Flexion  Strengthening;10 reps    Flexion Weight (lbs)  1    Abduction  Strengthening;10 reps    ABduction Weight (lbs)  1      Shoulder Exercises: ROM/Strengthening   X to V Arms  20X    Proximal Shoulder Strengthening, Seated  20X    Other ROM/Strengthening Exercises  Washcloth circles on door 1' one direction; 1' opposite direction     Other ROM/Strengthening Exercises  Red loop band: wall walk 15X; diagonals 15X      Manual Therapy   Manual Therapy  Myofascial release    Manual therapy comments  Manual therapy completed prior to exercises.    Myofascial Release  Myofascial release and manual stretching completed to right upper arm, trapezius, and scapularis region to decrease fascial restrictions and increase ROM in a pain free zone.              OT Education - 06/30/18 1514    Education Details  Pt was educated on and provided with shoulder AROM with low weight.    Person(s) Educated  Patient    Methods  Explanation;Handout    Comprehension  Verbalized understanding       OT Short Term Goals - 06/30/18 1540      OT SHORT TERM GOAL #1   Title  Patient will be  educated and independent with HEP to facilitate progress with therapy and increase ability to return to using RUE  as dominant.    Time  3     Period  Weeks    Status  Achieved      OT SHORT TERM GOAL #2   Title  Patient will report a decreased pain level of 4/10 when completing daily tasks using RUE.     Time  3    Period  Weeks    Status  Achieved      OT SHORT TERM GOAL #3   Title  Patient will decrease fascial restrictions to min amount in RUE in order to increase functional mobility and decrease pain level when reaching overhead.     Time  3    Period  Weeks    Status  Achieved        OT Long Term Goals - 06/30/18 1540      OT LONG TERM GOAL #1   Title  Patient will return to highest level of independence while using his RUE for all daily and leisure tasks as his dominant extremity.     Time  6    Period  Weeks    Status  On-going      OT LONG TERM GOAL #2   Title  Patient will report a decrease level of pain and discomfort of approximately 3/10 when completing daily tasks using his RUE.     Time  6    Period  Weeks    Status  Achieved      OT LONG TERM GOAL #3   Title  Patient will decrease fascial restrictions to trace amount in his right UE to increase functional mobilty and functional reaching ability.     Time  6    Period  Weeks    Status  On-going      OT LONG TERM GOAL #4   Title  Patient will increase RUE A/ROM to WNL in order to be able to reach overhead and out to the side with less difficulty.     Time  6    Period  Weeks    Status  On-going      OT LONG TERM GOAL #5   Title  Patient will increase RUE strength to 5/5 in order to return to completing normal lifting tasks without difficulty.     Time  6    Period  Weeks    Status  On-going            Plan - 06/30/18 1519    Clinical Impression Statement  A: Completed reassessment this session and pt has met 3/3 STGs and 1/5 LTGs this date. Discussed progress and continued treatment options with pt and he decided to discontinue OT services for the time being as he feels that pain has not improved and he does not feel that therapy  has been effective. However, he will discuss further with his doctor at his next appointment.    Plan  P: Discharged pt with HEP.    OT Home Exercise Plan  Shoulder AROM weight 1# weights added     Consulted and Agree with Plan of Care  Patient       Patient will benefit from skilled therapeutic intervention in order to improve the following deficits and impairments:  Decreased range of motion, Increased fascial restrictions, Pain, Impaired UE functional use, Decreased strength  Visit Diagnosis: Chronic right shoulder pain  Stiffness of right shoulder, not elsewhere classified  Other symptoms and signs involving the musculoskeletal system    Problem List Patient Active  Problem List   Diagnosis Date Noted  . Abdominal pain 08/04/2017  . Constipation 01/11/2017  . History of colonic polyps 01/11/2017  . Right shoulder pain 12/03/2015  . Muscle weakness (generalized) 07/02/2014  . Decreased range of motion of right shoulder 07/02/2014  . Rotator cuff impingement syndrome of right shoulder 06/12/2014  . Coronary atherosclerosis of native coronary artery 09/26/2013  . Tobacco abuse 09/26/2013  . Essential hypertension, benign 09/25/2013    Toney Rakes, OT student 06/30/2018, 5:54 PM  [place], [place], [address] Phone: [phone]   Fax:  [phone]  Name: Peter Campbell MRN: [id number] Date of Birth: March 28, 1956    OCCUPATIONAL THERAPY DISCHARGE SUMMARY  Visits from Start of Care: 11  Current functional level related to goals / functional outcomes: See above. Discussed progress with pt and he decided to discontinue services because he feels that pain has not improved with treatment. Pt will return to doctor for further testing.    Remaining deficits: Pain, muscle weakness, impaired functional UE use, decreased activity tolerance    Education / Equipment: Provided with A/ROM HEP 1# weights  added. Plan: Patient agrees to discharge.  Patient goals were partially met. Patient is being discharged due to lack of progress.  ?????

## 2018-07-11 ENCOUNTER — Encounter: Payer: Self-pay | Admitting: Internal Medicine

## 2018-07-12 NOTE — Addendum Note (Signed)
Addended by: Ezra Sites A on: 07/12/2018 05:00 PM   Modules accepted: Orders

## 2018-07-14 ENCOUNTER — Encounter: Payer: Self-pay | Admitting: Orthopaedic Surgery

## 2018-07-14 ENCOUNTER — Ambulatory Visit (INDEPENDENT_AMBULATORY_CARE_PROVIDER_SITE_OTHER): Payer: Medicare Other | Admitting: Orthopaedic Surgery

## 2018-07-14 VITALS — Ht 70.0 in | Wt 168.0 lb

## 2018-07-14 DIAGNOSIS — G8929 Other chronic pain: Secondary | ICD-10-CM

## 2018-07-14 DIAGNOSIS — F17210 Nicotine dependence, cigarettes, uncomplicated: Secondary | ICD-10-CM

## 2018-07-14 DIAGNOSIS — M25511 Pain in right shoulder: Secondary | ICD-10-CM

## 2018-07-14 MED ORDER — HYDROCODONE-ACETAMINOPHEN 5-325 MG PO TABS: ORAL_TABLET | ORAL | 0 refills | Status: DC

## 2018-07-14 NOTE — Patient Instructions (Signed)
Steps to Quit Smoking Smoking tobacco can be bad for your health. It can also affect almost every organ in your body. Smoking puts you and people around you at risk for many serious long-lasting (chronic) diseases. Quitting smoking is hard, but it is one of the best things that you can do for your health. It is never too late to quit. What are the benefits of quitting smoking? When you quit smoking, you lower your risk for getting serious diseases and conditions. They can include:  Lung cancer or lung disease.  Heart disease.  Stroke.  Heart attack.  Not being able to have children (infertility).  Weak bones (osteoporosis) and broken bones (fractures).  If you have coughing, wheezing, and shortness of breath, those symptoms may get better when you quit. You may also get sick less often. If you are pregnant, quitting smoking can help to lower your chances of having a baby of low birth weight. What can I do to help me quit smoking? Talk with your doctor about what can help you quit smoking. Some things you can do (strategies) include:  Quitting smoking totally, instead of slowly cutting back how much you smoke over a period of time.  Going to in-person counseling. You are more likely to quit if you go to many counseling sessions.  Using resources and support systems, such as: ? Online chats with a counselor. ? Phone quitlines. ? Printed self-help materials. ? Support groups or group counseling. ? Text messaging programs. ? Mobile phone apps or applications.  Taking medicines. Some of these medicines may have nicotine in them. If you are pregnant or breastfeeding, do not take any medicines to quit smoking unless your doctor says it is okay. Talk with your doctor about counseling or other things that can help you.  Talk with your doctor about using more than one strategy at the same time, such as taking medicines while you are also going to in-person counseling. This can help make  quitting easier. What things can I do to make it easier to quit? Quitting smoking might feel very hard at first, but there is a lot that you can do to make it easier. Take these steps:  Talk to your family and friends. Ask them to support and encourage you.  Call phone quitlines, reach out to support groups, or work with a counselor.  Ask people who smoke to not smoke around you.  Avoid places that make you want (trigger) to smoke, such as: ? Bars. ? Parties. ? Smoke-break areas at work.  Spend time with people who do not smoke.  Lower the stress in your life. Stress can make you want to smoke. Try these things to help your stress: ? Getting regular exercise. ? Deep-breathing exercises. ? Yoga. ? Meditating. ? Doing a body scan. To do this, close your eyes, focus on one area of your body at a time from head to toe, and notice which parts of your body are tense. Try to relax the muscles in those areas.  Download or buy apps on your mobile phone or tablet that can help you stick to your quit plan. There are many free apps, such as QuitGuide from the CDC (Centers for Disease Control and Prevention). You can find more support from smokefree.gov and other websites.  This information is not intended to replace advice given to you by your health care provider. Make sure you discuss any questions you have with your health care provider. Document Released: 07/11/2009 Document   Revised: 05/12/2016 Document Reviewed: 01/29/2015 Elsevier Interactive Patient Education  2018 Elsevier Inc.  

## 2018-07-14 NOTE — Progress Notes (Signed)
PROCEDURE NOTE:  The patient request injection, verbal consent was obtained.  The right shoulder was prepped appropriately after time out was performed.   Sterile technique was observed and injection of 1 cc of Depo-Medrol 40 mg with several cc's of plain xylocaine. Anesthesia was provided by ethyl chloride and a 20-gauge needle was used to inject the shoulder area. A posterior approach was used.  The injection was tolerated well.  A band aid dressing was applied.  The patient was advised to apply ice later today and tomorrow to the injection sight as needed.  Return in one month.  I have reviewed the [place] Controlled Substance Reporting System web site prior to prescribing narcotic medicine for this patient.    Electronically Signed Darreld Mclean, MD 10/17/20191:52 PM

## 2018-08-11 ENCOUNTER — Encounter: Payer: Self-pay | Admitting: Orthopaedic Surgery

## 2018-08-11 ENCOUNTER — Ambulatory Visit (INDEPENDENT_AMBULATORY_CARE_PROVIDER_SITE_OTHER): Payer: Medicare Other | Admitting: Orthopaedic Surgery

## 2018-08-11 DIAGNOSIS — M25511 Pain in right shoulder: Secondary | ICD-10-CM | POA: Diagnosis not present

## 2018-08-11 DIAGNOSIS — F17210 Nicotine dependence, cigarettes, uncomplicated: Secondary | ICD-10-CM

## 2018-08-11 DIAGNOSIS — G8929 Other chronic pain: Secondary | ICD-10-CM | POA: Diagnosis not present

## 2018-08-11 MED ORDER — HYDROCODONE-ACETAMINOPHEN 5-325 MG PO TABS: ORAL_TABLET | ORAL | 0 refills | Status: DC

## 2018-08-11 NOTE — Progress Notes (Signed)
PROCEDURE NOTE:  The patient request injection, verbal consent was obtained.  The right shoulder was prepped appropriately after time out was performed.   Sterile technique was observed and injection of 1 cc of Depo-Medrol 40 mg with several cc's of plain xylocaine. Anesthesia was provided by ethyl chloride and a 20-gauge needle was used to inject the shoulder area. A posterior approach was used.  The injection was tolerated well.  A band aid dressing was applied.  The patient was advised to apply ice later today and tomorrow to the injection sight as needed.  I have [place] the [place] Cont[place] web site prior to prescribing narcotic medicine for this patient.   Return in one month  Electronically Signed Darreld McleanWayne Sarika Baldini, MD 11/14/20191:44 PM

## 2018-08-19 ENCOUNTER — Encounter: Payer: Self-pay | Admitting: Internal Medicine

## 2018-08-19 ENCOUNTER — Ambulatory Visit: Payer: Medicare Other | Admitting: Internal Medicine

## 2018-08-19 ENCOUNTER — Ambulatory Visit (INDEPENDENT_AMBULATORY_CARE_PROVIDER_SITE_OTHER): Payer: Medicare Other | Admitting: Internal Medicine

## 2018-08-19 VITALS — BP 126/80 | HR 77 | Temp 97.1°F | Ht 70.0 in | Wt 169.8 lb

## 2018-08-19 DIAGNOSIS — K5909 Other constipation: Secondary | ICD-10-CM | POA: Diagnosis not present

## 2018-08-19 DIAGNOSIS — Z86010 Personal history of colonic polyps: Secondary | ICD-10-CM | POA: Diagnosis not present

## 2018-08-19 DIAGNOSIS — I2510 Atherosclerotic heart disease of native coronary artery without angina pectoris: Secondary | ICD-10-CM

## 2018-08-19 NOTE — Progress Notes (Signed)
Primary Care Physician:  Alvina Filbert, MD Primary Gastroenterologist:  Dr. Jena Gauss  Pre-Procedure History & Physical: HPI:  Peter Campbell is a 62 y.o. male here for follow-up of constipation.  Linzess 145 doing pretty well for bowel function.  He states when he eats heavy foods  -  like red meat he may get constipated - sometimes takes a second dose.  This happens multiple times monthly.  Overall, feels Linzess is helping.  History of colonic adenomas in the distant past; due for surveillance colonoscopy in 4 years.  Past Medical History:  Diagnosis Date  . Asthma   . Chronic back pain   . Collagen vascular disease (HCC)   . Colonic mass    History, s/p removal; per patient.  . Coronary atherosclerosis of native coronary artery    Dense coronary atherosclerosis by chest CT November 2014   . Essential hypertension, benign   . Essential tremor   . History of chicken pox   . History of [place] measles   . Sciatica     Past Surgical History:  Procedure Laterality Date  . ABDOMINAL SURGERY    . CIRCUMCISION    . COLON SURGERY     for mass removal  . COLONOSCOPY WITH PROPOFOL N/A 05/03/2017   Procedure: COLONOSCOPY WITH PROPOFOL;  Surgeon: Corbin Ade, MD;  [facility];  Service: Endoscopy;  Laterality: N/A;  1015  . POLYPECTOMY  05/03/2017   Procedure: POLYPECTOMY;  Surgeon: Corbin Ade, MD;  [facility];  Service: Endoscopy;;  descending colon and rectal  . SHOULDER ACROMIOPLASTY Right 06/12/2014   Procedure: RIGHT SHOULDER ACROMIOPLASTY;  Surgeon: Darreld Mclean, MD;  Location: [facility];  Service: Orthopedics;  Laterality: Right;  . SHOULDER OPEN ROTATOR CUFF REPAIR Right 06/12/2014   Procedure: OPEN REPAIR ROTATOR CUFF RIGHT ;  Surgeon: Darreld Mclean, MD;  Location: [facility];  Service: Orthopedics;  Laterality: Right;  . TONSILLECTOMY      Prior to Admission medications   Medication Sig Start Date End Date Taking? Authorizing Provider  albuterol  (PROVENTIL HFA;VENTOLIN HFA) 108 (90 Base) MCG/ACT inhaler Inhale 1-2 puffs into the lungs every 6 (six) hours as needed for wheezing or shortness of breath.   Yes [provider]  amLODipine (NORVASC) 5 MG tablet TAKE 1 TABLET BY MOUTH ONCE DAILY. 03/07/18  Yes Jonelle Sidle, MD  aspirin EC 81 MG tablet Take 1 tablet (81 mg total) by mouth daily. 10/31/13  Yes Jonelle Sidle, MD  gabapentin (NEURONTIN) 600 MG tablet Take 600 mg by mouth 3 (three) times daily.  01/26/18  Yes [provider]  HYDROcodone-acetaminophen (NORCO/VICODIN) 5-325 MG tablet One tablet every four hours as needed for pain. 08/11/18  Yes Darreld Mclean, MD  ipratropium-albuterol (DUONEB) 0.5-2.5 (3) MG/3ML SOLN Take 3 mLs by nebulization every 6 (six) hours as needed. Patient taking differently: Take 3 mLs by nebulization every 6 (six) hours as needed (shortness of breath).  06/19/15  Yes Delo, Riley Lam, MD  LINZESS 145 MCG CAPS capsule TAKE (1) CAPSULE BY MOUTH ONCE DAILY BEFORE BREAKFAST. 05/17/18  Yes Gelene Mink, NP  metoprolol succinate (TOPROL-XL) 25 MG 24 hr tablet TAKE 1 TABLET BY MOUTH TWICE DAILY 03/21/18  Yes Jonelle Sidle, MD  naproxen (NAPROSYN) 500 MG tablet TAKE (1) TABLET BY MOUTH TWICE A DAY WITH MEALS (BREAKFAST AND SUPPER) 06/08/18  Yes Darreld Mclean, MD  nitroGLYCERIN (NITROSTAT) 0.4 MG SL tablet Place 1 tablet (0.4 mg total)  under the tongue every 5 (five) minutesas needed for chest pain. 01/07/18  Yes Jonelle SidleMcDowell, Samuel G, MD  temazepam (RESTORIL) 15 MG capsule Take 15 mg by mouth at bedtime. Takes with 30mg  (total 45mg  at bedtime)   Yes [provider]    Allergies as of 08/19/2018  . (No Known Allergies)    Family History  Problem Relation Age of Onset  . COPD Father   . Cancer Father   . Cancer Mother   . Heart attack Brother   . Epilepsy Sister   . Hypertension Sister   . Colon cancer Neg Hx     Social History   Socioeconomic History  . Marital status:  Widowed    Spouse name: Not on file  . Number of children: Not on file  . Years of education: Not on file  . Highest education level: Not on file  Occupational History  . Not on file  Social Needs  . Financial resource strain: Not on file  . Food insecurity:    Worry: Not on file    Inability: Not on file  . Transportation needs:    Medical: Not on file    Non-medical: Not on file  Tobacco Use  . Smoking status: Current Every Day Smoker    Packs/day: 1.00    Years: 21.00    Pack years: 21.00    Types: Cigarettes    Start date: 08/28/1969  . Smokeless tobacco: Never Used  Substance and Sexual Activity  . Alcohol use: No    Alcohol/week: 0.0 standard drinks  . Drug use: No    Types: Marijuana  . Sexual activity: Yes    Partners: Female  Lifestyle  . Physical activity:    Days per week: Not on file    Minutes per session: Not on file  . Stress: Not on file  Relationships  . Social connections:    Talks on phone: Not on file    Gets together: Not on file    Attends religious service: Not on file    Active member of club or organization: Not on file    Attends meetings of clubs or organizations: Not on file    Relationship status: Not on file  . Intimate partner violence:    Fear of current or ex partner: Not on file    Emotionally abused: Not on file    Physically abused: Not on file    Forced sexual activity: Not on file  Other Topics Concern  . Not on file  Social History Narrative  . Not on file    Review of Systems: See HPI, otherwise negative ROS  Physical Exam: BP 126/80   Pulse 77   Temp (!) 97.1 F (36.2 C) (Oral)   Ht 5\' 10"  (1.778 m)   Wt 169 lb 12.8 oz (77 kg)   BMI 24.36 kg/m  General:   Alert,  Well-developed, well-nourished, pleasant and cooperative in NAD  Neck:  Supple; no masses or thyromegaly. No significant cervical adenopathy. Lungs:  Clear throughout to auscultation.   No wheezes, crackles, or rhonchi. No acute distress. Heart:   Regular rate and rhythm; no murmurs, clicks, rubs,  or gallops. Abdomen: Non-distended, normal bowel sounds.  Soft and nontender without appreciable mass or hepatosplenomegaly.  Pulses:  Normal pulses noted. Extremities:  Without clubbing or edema. Impression: Pleasant 62 year old gentleman chronic constipation responsive to Linzess.  Strict colonic polyps in the distant past; due for surveillance examination for years.  Recommendations: Change Linzess  dosing to 145 everyday with an extra dose (total of 290) every other day.  If  any difficulties with this regimen, he let us know  Office visit with [place] in 1 year  Plan for a repeat colonoscopy in 4 years         Notice: This dictation was prepared with Dragon dictation along with smaller phrase technology. Any transcriptional errors that result from this process are unintentional and may not be corrected upon review.

## 2018-08-19 NOTE — Patient Instructions (Signed)
Change Linzess dosing to 145 everyday with an extra dose (total of 290) every other day.  If you have any difficulties with this regimen let us know  Office visit with us in 1 year  Plan for a repeat colonoscopy in 4 years

## 2018-08-22 ENCOUNTER — Other Ambulatory Visit: Payer: Self-pay

## 2018-08-22 ENCOUNTER — Telehealth: Payer: Self-pay

## 2018-08-22 MED ORDER — LINACLOTIDE 145 MCG PO CAPS: ORAL_CAPSULE | ORAL | 3 refills | Status: DC

## 2018-08-22 NOTE — Telephone Encounter (Signed)
Rourk, Gerrit Friendsobert M, MD  Lamar BenesMoore, Tanea Moga, CMA        Please call a prescription in this week for patient. Linzess 145 capsules.   Dispense 135. Take a 145 capsule daily every day; take a second 145 capsule every other day. 3 refills. Thanks.     Linzess 145 mcg sent into pts pharmacy per RMR. See above message from RMR.

## 2018-08-23 ENCOUNTER — Telehealth: Payer: Self-pay | Admitting: Internal Medicine

## 2018-08-23 NOTE — Telephone Encounter (Signed)
Pt was seen recently in the office and called today to say that he is out of Linzess 145mg  and needs a refill ASAP. [place] is in [place], [place], but said if we had samples he would make the trip to get here. He uses [facility]. Please advise if we are doing a PA and if he can get enough Linzess samples to hold him until he can get his prescription filled. [phone]

## 2018-08-23 NOTE — Telephone Encounter (Signed)
PA submitted by [email or web address]. Waiting on an approval or denial. Samples are ready for pickup.

## 2018-08-24 NOTE — Telephone Encounter (Signed)
PA for Linzess was approved. Pt notified of approval. Approval letter scanned into chart.

## 2018-09-07 ENCOUNTER — Encounter: Payer: Self-pay | Admitting: Orthopaedic Surgery

## 2018-09-07 ENCOUNTER — Ambulatory Visit (INDEPENDENT_AMBULATORY_CARE_PROVIDER_SITE_OTHER): Payer: Medicare Other | Admitting: Orthopaedic Surgery

## 2018-09-07 VITALS — BP 133/82 | HR 72 | Ht 70.0 in | Wt 171.0 lb

## 2018-09-07 DIAGNOSIS — F17210 Nicotine dependence, cigarettes, uncomplicated: Secondary | ICD-10-CM

## 2018-09-07 DIAGNOSIS — M25511 Pain in right shoulder: Secondary | ICD-10-CM

## 2018-09-07 DIAGNOSIS — G8929 Other chronic pain: Secondary | ICD-10-CM | POA: Diagnosis not present

## 2018-09-07 MED ORDER — HYDROCODONE-ACETAMINOPHEN 5-325 MG PO TABS: ORAL_TABLET | ORAL | 0 refills | Status: DC

## 2018-09-07 NOTE — Progress Notes (Signed)
PROCEDURE NOTE:  The patient request injection, verbal consent was obtained.  The right shoulder was prepped appropriately after time out was performed.   Sterile technique was observed and injection of 1 cc of Depo-Medrol 40 mg with several cc's of plain xylocaine. Anesthesia was provided by ethyl chloride and a 20-gauge needle was used to inject the shoulder area. A posterior approach was used.  The injection was tolerated well.  A band aid dressing was applied.  The patient was advised to apply ice later today and tomorrow to the injection sight as needed.  Return in one month.  I have reviewed the [place] Controlled Substance Reporting System web site prior to prescribing narcotic medicine for this patient.   Electronically Signed Darreld McleanWayne Jaquasia Doscher, MD 12/11/20192:31 PM

## 2018-09-08 ENCOUNTER — Other Ambulatory Visit: Payer: Self-pay | Admitting: Cardiology

## 2018-10-05 ENCOUNTER — Ambulatory Visit (INDEPENDENT_AMBULATORY_CARE_PROVIDER_SITE_OTHER): Payer: Medicare Other | Admitting: Orthopaedic Surgery

## 2018-10-05 ENCOUNTER — Encounter: Payer: Self-pay | Admitting: Orthopaedic Surgery

## 2018-10-05 DIAGNOSIS — G8929 Other chronic pain: Secondary | ICD-10-CM | POA: Diagnosis not present

## 2018-10-05 DIAGNOSIS — M25511 Pain in right shoulder: Secondary | ICD-10-CM | POA: Diagnosis not present

## 2018-10-05 MED ORDER — NAPROXEN 500 MG PO TABS: 500.0000 mg | ORAL_TABLET | Freq: Two times a day (BID) | ORAL | 5 refills | Status: DC

## 2018-10-05 MED ORDER — HYDROCODONE-ACETAMINOPHEN 5-325 MG PO TABS: ORAL_TABLET | ORAL | 0 refills | Status: DC

## 2018-10-05 NOTE — Progress Notes (Signed)
PROCEDURE NOTE:  The patient request injection, verbal consent was obtained.  The right shoulder was prepped appropriately after time out was performed.   Sterile technique was observed and injection of 1 cc of Depo-Medrol 40 mg with several cc's of plain xylocaine. Anesthesia was provided by ethyl chloride and a 20-gauge needle was used to inject the shoulder area. A posterior approach was used.  The injection was tolerated well.  A band aid dressing was applied.  The patient was advised to apply ice later today and tomorrow to the injection sight as needed.  I have reviewed the [place] Controlled Substance Reporting System web site prior to prescribing narcotic medicine for this patient.   Electronically Signed Darreld Mclean, MD 1/8/20202:33 PM

## 2018-10-18 NOTE — Progress Notes (Signed)
Cardiology Office Note  Date: 10/19/2018   ID: Peter Campbell, DOB 09-11-1956, MRN [id number]  PCP: Alvina Filbert, MD  Primary Cardiologist: Peter Dell, MD   Chief Complaint  Patient presents with  . Cardiac follow-up    History of Present Illness: Peter Campbell is a 63 y.o. male last seen in July 2019.  He is here for a routine follow-up visit.  From a cardiac perspective, he has not had any significant angina symptoms since last encounter, no nitroglycerin use.  He reports compliance with his remaining medications which are listed below.  He has been traveling with an acquaintance, continues to play country and [employer].  We discussed smoking cessation again today.  He has quit in the past, but has not been overly motivated to cut back recently.  I talked with him about various strategies to help him quit again.  He reports having lipids done within the last 3 months, results to be requested.  He continues to decline medical therapy.  I personally reviewed his ECG today which shows sinus rhythm with low voltage and nonspecific ST changes.  Past Medical History:  Diagnosis Date  . Asthma   . Chronic back pain   . Collagen vascular disease (HCC)   . Colonic mass    History, s/p removal; per patient.  . Coronary atherosclerosis of native coronary artery    Dense coronary atherosclerosis by chest CT November 2014   . Essential hypertension, benign   . Essential tremor   . History of chicken pox   . History of [place] measles   . Sciatica     Past Surgical History:  Procedure Laterality Date  . ABDOMINAL SURGERY    . CIRCUMCISION    . COLON SURGERY     for mass removal  . COLONOSCOPY WITH PROPOFOL N/A 05/03/2017   Procedure: COLONOSCOPY WITH PROPOFOL;  Surgeon: Peter Ade, MD;  [facility];  Service: Endoscopy;  Laterality: N/A;  1015  . POLYPECTOMY  05/03/2017   Procedure: POLYPECTOMY;  Surgeon: Peter Ade, MD;  [facility];   Service: Endoscopy;;  descending colon and rectal  . SHOULDER ACROMIOPLASTY Right 06/12/2014   Procedure: RIGHT SHOULDER ACROMIOPLASTY;  Surgeon: Peter Mclean, MD;  Location: [facility];  Service: Orthopedics;  Laterality: Right;  . SHOULDER OPEN ROTATOR CUFF REPAIR Right 06/12/2014   Procedure: OPEN REPAIR ROTATOR CUFF RIGHT ;  Surgeon: Peter Mclean, MD;  Location: [facility];  Service: Orthopedics;  Laterality: Right;  . TONSILLECTOMY      Current Outpatient Medications  Medication Sig Dispense Refill  . albuterol (PROVENTIL HFA;VENTOLIN HFA) 108 (90 Base) MCG/ACT inhaler Inhale 1-2 puffs into the lungs every 6 (six) hours as needed for wheezing or shortness of breath.    Marland Kitchen amLODipine (NORVASC) 5 MG tablet Take 1 tablet (5 mg total) by mouth daily. 90 tablet 3  . aspirin EC 81 MG tablet Take 1 tablet (81 mg total) by mouth daily. 90 tablet 3  . gabapentin (NEURONTIN) 600 MG tablet Take 600 mg by mouth 3 (three) times daily.     Marland Kitchen HYDROcodone-acetaminophen (NORCO/VICODIN) 5-325 MG tablet One tablet every four hours as needed for pain. 75 tablet 0  . ipratropium-albuterol (DUONEB) 0.5-2.5 (3) MG/3ML SOLN Take 3 mLs by nebulization every 6 (six) hours as needed. (Patient taking differently: Take 3 mLs by nebulization every 6 (six) hours as needed (shortness of breath). ) 360 mL 0  . linaclotide (LINZESS) 145 MCG  CAPS capsule Take one capsule daily alternating with two capsules every other day    . metoprolol succinate (TOPROL-XL) 25 MG 24 hr tablet Take 1 tablet (25 mg total) by mouth 2 (two) times daily. 180 tablet 3  . naproxen (NAPROSYN) 500 MG tablet Take 1 tablet (500 mg total) by mouth 2 (two) times daily with a meal. 60 tablet 5  . nitroGLYCERIN (NITROSTAT) 0.4 MG SL tablet Place 1 tablet (0.4 mg total) under the tongue every 5 (five) minutesas needed for chest pain. 25 tablet 3  . temazepam (RESTORIL) 15 MG capsule Take 45 mg by mouth at bedtime.      No current facility-administered medications  for this visit.    Allergies:  Patient has no known allergies.   Social History: The patient  reports that he has been smoking cigarettes. He started smoking about 49 years ago. He has a 21.00 pack-year smoking history. He has never used smokeless tobacco. He reports that he does not drink alcohol or use drugs.   ROS:  Please see the history of present illness. Otherwise, complete review of systems is positive for pain from right shoulder spur.  All other systems are reviewed and negative.   Physical Exam: VS:  BP 118/70   Pulse 78   Ht 5\' 10"  (1.778 m)   Wt 170 lb 6.4 oz (77.3 kg)   SpO2 95%   BMI 24.45 kg/m , BMI Body mass index is 24.45 kg/m.  Wt Readings from Last 3 Encounters:  10/19/18 170 lb 6.4 oz (77.3 kg)  10/05/18 172 lb (78 kg)  09/07/18 171 lb (77.6 kg)    General: Patient appears comfortable at rest. HEENT: Conjunctiva and lids normal, oropharynx clear. Neck: Supple, no elevated JVP or carotid bruits. Lungs: Clear to auscultation, nonlabored breathing at rest. Cardiac: Regular rate and rhythm, no S3, soft systolic murmur, no pericardial rub. Abdomen: Soft, nontender, bowel sounds present. Extremities: No pitting edema, distal pulses 2+. Skin: Warm and dry. Musculoskeletal: No kyphosis. Neuropsychiatric: Alert and oriented x3, affect grossly appropriate.  ECG: I personally reviewed the tracing from 06/30/2017 which showed sinus rhythm.  Recent Labwork: No results found for requested labs within last 8760 hours.     Component Value Date/Time   CHOL 122 07/08/2017 0826   TRIG 104 07/08/2017 0826   HDL 33 (L) 07/08/2017 0826   CHOLHDL 3.7 07/08/2017 0826   VLDL 21 07/08/2017 0826   LDLCALC 68 07/08/2017 0826    Other Studies Reviewed Today:  Eugenie Campbell Myoview 07/08/2017:  The study is normal.  This is a low risk study.  The left ventricular ejection fraction is normal (55-65%).  There was no ST segment deviation noted during stress.  Diaphragmatic  attenuation no ischemia. SDS 1. Normal wall motion EF 60%   Assessment and Plan:  1.  Multivessel distribution coronary artery calcifications by CT imaging with low risk Myoview update in 2018.  He does not report any progressive angina symptoms and continues on medical therapy including aspirin, beta-blocker, and Norvasc.  He has nitroglycerin available, bottle being refilled.  Otherwise he declined statin therapy.  ECG reviewed today and stable.  2. The patient was counseled on tobacco cessation today for 5 minutes.  Counseling included reviewing the risks of smoking tobacco products, how it impacts the patient's current medical diagnoses and different strategies for quitting.  Pharmacotherapy to aid in tobacco cessation was not prescribed today.  3.  Essential hypertension, blood pressure is well controlled today.  No changes  in current regimen.  Refills provided for Norvasc and Toprol-XL.  Current medicines were reviewed with the patient today.   Orders Placed This Encounter  Procedures  . EKG 12-Lead    Disposition: Follow-up in 6 months.  Signed, Jonelle SidleSamuel G. McDowell, MD, [facility] 10/19/2018 11:43 AM    [facility] [address], [place], [place] [address] Phone: [phone]; Fax: [phone]

## 2018-10-19 ENCOUNTER — Encounter: Payer: Self-pay | Admitting: Cardiology

## 2018-10-19 ENCOUNTER — Ambulatory Visit (INDEPENDENT_AMBULATORY_CARE_PROVIDER_SITE_OTHER): Payer: Medicare Other | Admitting: Cardiology

## 2018-10-19 VITALS — BP 118/70 | HR 78 | Ht 70.0 in | Wt 170.4 lb

## 2018-10-19 DIAGNOSIS — Z720 Tobacco use: Secondary | ICD-10-CM

## 2018-10-19 DIAGNOSIS — F17210 Nicotine dependence, cigarettes, uncomplicated: Secondary | ICD-10-CM

## 2018-10-19 DIAGNOSIS — I10 Essential (primary) hypertension: Secondary | ICD-10-CM

## 2018-10-19 DIAGNOSIS — I2510 Atherosclerotic heart disease of native coronary artery without angina pectoris: Secondary | ICD-10-CM | POA: Diagnosis not present

## 2018-10-19 MED ORDER — METOPROLOL SUCCINATE ER 25 MG PO TB24: 25.0000 mg | ORAL_TABLET | Freq: Two times a day (BID) | ORAL | 3 refills | Status: DC

## 2018-10-19 MED ORDER — NITROGLYCERIN 0.4 MG SL SUBL: SUBLINGUAL_TABLET | SUBLINGUAL | 3 refills | Status: DC

## 2018-10-19 MED ORDER — AMLODIPINE BESYLATE 5 MG PO TABS: 5.0000 mg | ORAL_TABLET | Freq: Every day | ORAL | 3 refills | Status: DC

## 2018-10-19 NOTE — Patient Instructions (Signed)

## 2018-11-02 ENCOUNTER — Encounter: Payer: Self-pay | Admitting: Orthopaedic Surgery

## 2018-11-02 ENCOUNTER — Ambulatory Visit (INDEPENDENT_AMBULATORY_CARE_PROVIDER_SITE_OTHER): Payer: Medicare Other | Admitting: Orthopaedic Surgery

## 2018-11-02 DIAGNOSIS — G8929 Other chronic pain: Secondary | ICD-10-CM | POA: Diagnosis not present

## 2018-11-02 DIAGNOSIS — M25511 Pain in right shoulder: Secondary | ICD-10-CM | POA: Diagnosis not present

## 2018-11-02 MED ORDER — HYDROCODONE-ACETAMINOPHEN 5-325 MG PO TABS: ORAL_TABLET | ORAL | 0 refills | Status: DC

## 2018-11-02 NOTE — Progress Notes (Signed)
PROCEDURE NOTE:  The patient request injection, verbal consent was obtained.  The right shoulder was prepped appropriately after time out was performed.   Sterile technique was observed and injection of 1 cc of Depo-Medrol 40 mg with several cc's of plain xylocaine. Anesthesia was provided by ethyl chloride and a 20-gauge needle was used to inject the shoulder area. A posterior approach was used.  The injection was tolerated well.  A band aid dressing was applied.  The patient was advised to apply ice later today and tomorrow to the injection sight as needed.  He has continued pain of the right shoulder and I am concerned about rotator cuff problem.  I will get MRI of the right shoulder.  Return after the MRI.  I have reviewed the [place] Controlled Substance Reporting System web site prior to prescribing narcotic medicine for this patient.   Electronically Signed Darreld Mclean, MD 2/5/20202:50 PM

## 2018-11-10 ENCOUNTER — Ambulatory Visit (HOSPITAL_COMMUNITY): Payer: Medicare Other

## 2018-11-15 ENCOUNTER — Ambulatory Visit: Payer: Medicare Other | Admitting: Orthopaedic Surgery

## 2018-11-18 ENCOUNTER — Ambulatory Visit (HOSPITAL_COMMUNITY)
Admission: RE | Admit: 2018-11-18 | Discharge: 2018-11-18 | Disposition: A | Discharge status: Home / Self Care | Payer: Medicare Other | Source: Ambulatory Visit | Attending: Orthopaedic Surgery | Admitting: Orthopaedic Surgery

## 2018-11-18 DIAGNOSIS — M25511 Pain in right shoulder: Secondary | ICD-10-CM | POA: Insufficient documentation

## 2018-11-18 DIAGNOSIS — G8929 Other chronic pain: Secondary | ICD-10-CM | POA: Diagnosis present

## 2018-11-22 ENCOUNTER — Ambulatory Visit (INDEPENDENT_AMBULATORY_CARE_PROVIDER_SITE_OTHER): Payer: Medicare Other | Admitting: Orthopaedic Surgery

## 2018-11-22 ENCOUNTER — Encounter: Payer: Self-pay | Admitting: Orthopaedic Surgery

## 2018-11-22 VITALS — BP 123/81 | HR 79 | Ht 70.0 in | Wt 168.0 lb

## 2018-11-22 DIAGNOSIS — F17210 Nicotine dependence, cigarettes, uncomplicated: Secondary | ICD-10-CM | POA: Diagnosis not present

## 2018-11-22 DIAGNOSIS — I2510 Atherosclerotic heart disease of native coronary artery without angina pectoris: Secondary | ICD-10-CM

## 2018-11-22 DIAGNOSIS — G8929 Other chronic pain: Secondary | ICD-10-CM | POA: Diagnosis not present

## 2018-11-22 DIAGNOSIS — M25511 Pain in right shoulder: Secondary | ICD-10-CM

## 2018-11-22 NOTE — Progress Notes (Signed)
Patient IR:Peter Campbell, male DOB:[birth year], 63 y.o. ZYS:[id number]  Chief Complaint  Patient presents with  . Shoulder Pain    HPI  Peter Campbell is a 63 y.o. male who has right shoulder pain chronically.  He had a MRI which showed: IMPRESSION: 1. Intact rotator cuff tendons.  No significant tendinopathy. 2. Intact long head biceps tendon and glenoid labrum. 3. Surgical changes from bony decompression. No findings for bony impingement. 4. Mild glenohumeral joint degenerative changes. 5. There is thickening of the capsular structures in the axillary recess which can be seen with adhesive capsulitis or synovitis.  I have explained the findings to him.  I have recommended exercises to do.  He does not need surgery.   Body mass index is 24.11 kg/m.  ROS  Review of Systems  Constitutional: Positive for activity change.  Respiratory: Positive for shortness of breath. Negative for cough.   Musculoskeletal: Positive for arthralgias and gait problem.  All other systems reviewed and are negative.   All other systems reviewed and are negative.  The following is a summary of the past history medically, past history surgically, known current medicines, social history and family history.  This information is gathered electronically by the computer from prior information and documentation.  I review this each visit and have found including this information at this point in the chart is beneficial and informative.    Past Medical History:  Diagnosis Date  . Asthma   . Chronic back pain   . Collagen vascular disease (HCC)   . Colonic mass    History, s/p removal; per patient.  . Coronary atherosclerosis of native coronary artery    Dense coronary atherosclerosis by chest CT November 2014   . Essential hypertension, benign   . Essential tremor   . History of chicken pox   . History of [place] measles   . Sciatica     Past Surgical History:  Procedure Laterality Date  . ABDOMINAL  SURGERY    . CIRCUMCISION    . COLON SURGERY     for mass removal  . COLONOSCOPY WITH PROPOFOL N/A 05/03/2017   Procedure: COLONOSCOPY WITH PROPOFOL;  Surgeon: Corbin Ade, MD;  Location: [facility];  Service: Endoscopy;  Laterality: N/A;  1015  . POLYPECTOMY  05/03/2017   Procedure: POLYPECTOMY;  Surgeon: Corbin Ade, MD;  Location: [facility];  Service: Endoscopy;;  descending colon and rectal  . SHOULDER ACROMIOPLASTY Right 06/12/2014   Procedure: RIGHT SHOULDER ACROMIOPLASTY;  Surgeon: Darreld Mclean, MD;  Location: [facility];  Service: Orthopedics;  Laterality: Right;  . SHOULDER OPEN ROTATOR CUFF REPAIR Right 06/12/2014   Procedure: OPEN REPAIR ROTATOR CUFF RIGHT ;  Surgeon: Darreld Mclean, MD;  Location: [facility];  Service: Orthopedics;  Laterality: Right;  . TONSILLECTOMY      Family History  Problem Relation Age of Onset  . COPD Father   . Cancer Father   . Cancer Mother   . Heart attack Brother   . Epilepsy Sister   . Hypertension Sister   . Colon cancer Neg Hx     Social History Social History   Tobacco Use  . Smoking status: Current Every Day Smoker    Packs/day: 1.00    Years: 21.00    Pack years: 21.00    Types: Cigarettes    Start date: 08/28/1969  . Smokeless tobacco: Never Used  Substance Use Topics  . Alcohol use: No    Alcohol/week: 0.0 standard drinks  .  Drug use: No    Types: Marijuana    No Known Allergies  Current Outpatient Medications  Medication Sig Dispense Refill  . albuterol (PROVENTIL HFA;VENTOLIN HFA) 108 (90 Base) MCG/ACT inhaler Inhale 1-2 puffs into the lungs every 6 (six) hours as needed for wheezing or shortness of breath.    Marland Kitchen amLODipine (NORVASC) 5 MG tablet Take 1 tablet (5 mg total) by mouth daily. 90 tablet 3  . aspirin EC 81 MG tablet Take 1 tablet (81 mg total) by mouth daily. 90 tablet 3  . gabapentin (NEURONTIN) 600 MG tablet Take 600 mg by mouth 3 (three) times daily.     Marland Kitchen HYDROcodone-acetaminophen (NORCO/VICODIN)  5-325 MG tablet One tablet every four hours as needed for pain. 75 tablet 0  . ipratropium-albuterol (DUONEB) 0.5-2.5 (3) MG/3ML SOLN Take 3 mLs by nebulization every 6 (six) hours as needed. (Patient taking differently: Take 3 mLs by nebulization every 6 (six) hours as needed (shortness of breath). ) 360 mL 0  . linaclotide (LINZESS) 145 MCG CAPS capsule Take one capsule daily alternating with two capsules every other day    . metoprolol succinate (TOPROL-XL) 25 MG 24 hr tablet Take 1 tablet (25 mg total) by mouth 2 (two) times daily. 180 tablet 3  . naproxen (NAPROSYN) 500 MG tablet Take 1 tablet (500 mg total) by mouth 2 (two) times daily with a meal. 60 tablet 5  . nitroGLYCERIN (NITROSTAT) 0.4 MG SL tablet Place 1 tablet (0.4 mg total) under the tongue every 5 (five) minutesas needed for chest pain. 25 tablet 3  . temazepam (RESTORIL) 15 MG capsule Take 45 mg by mouth at bedtime.      No current facility-administered medications for this visit.      Physical Exam  Blood pressure 123/81, pulse 79, height  (1.778 m), weight 168 lb (76.2 kg).  Constitutional: overall normal hygiene, normal nutrition, well developed, normal grooming, normal body habitus. Assistive device:none  Musculoskeletal: gait and station Limp none, muscle tone and strength are normal, no tremors or atrophy is present.  .  Neurological: coordination overall normal.  Deep tendon reflex/nerve stretch intact.  Sensation normal.  Cranial nerves II-XII intact.   Skin:   Normal overall no scars, lesions, ulcers or rashes. No psoriasis.  Psychiatric: Alert and oriented x 3.  Recent memory intact, remote memory unclear.  Normal mood and affect. Well groomed.  Good eye contact.  Cardiovascular: overall no swelling, no varicosities, no edema bilaterally, normal temperatures of the legs and arms, no clubbing, cyanosis and good capillary refill.  Examination of right Upper Extremity is done.  Inspection:   Overall:   Elbow non-tender without crepitus or defects, forearm non-tender without crepitus or defects, wrist non-tender without crepitus or defects, hand non-tender.    Shoulder: with glenohumeral joint tenderness, without effusion.   Upper arm: without swelling and tenderness   Range of motion:   Overall:  Full range of motion of the elbow, full range of motion of wrist and full range of motion in fingers.   Shoulder:  right  160 degrees forward flexion; 150 degrees abduction; 35 degrees internal rotation, 35 degrees external rotation, 15 degrees extension, 40 degrees adduction.   Stability:   Overall:  Shoulder, elbow and wrist stable   Strength and Tone:   Overall full shoulder muscles strength, full upper arm strength and normal upper arm bulk and tone. Lymphatic: palpation is normal.  All other systems reviewed and are negative   The patient has  been educated about the nature of the problem(s) and counseled on treatment options.  The patient appeared to understand what I have discussed and is in agreement with it.  Encounter Diagnoses  Name Primary?  . Chronic right shoulder pain Yes  . Cigarette nicotine dependence without complication     PLAN Call if any problems.  Precautions discussed.  Continue current medications.   Return to clinic 1 month   Electronically Signed Darreld Mclean, MD 2/25/20202:25 PM

## 2018-11-30 ENCOUNTER — Telehealth: Payer: Self-pay | Admitting: Orthopaedic Surgery

## 2018-11-30 DIAGNOSIS — G8929 Other chronic pain: Secondary | ICD-10-CM

## 2018-11-30 DIAGNOSIS — M25511 Pain in right shoulder: Principal | ICD-10-CM

## 2018-11-30 MED ORDER — HYDROCODONE-ACETAMINOPHEN 5-325 MG PO TABS: ORAL_TABLET | ORAL | 0 refills | Status: DC

## 2018-11-30 NOTE — Telephone Encounter (Signed)
Patient requests refill on Hydrocodone/Acetaminophen 5-325  Mgs.  Qty  75  Sig: One tablet every four hours as needed for pain.  Patient states he uses [facility]

## 2018-12-08 ENCOUNTER — Other Ambulatory Visit: Payer: Self-pay | Admitting: Orthopaedic Surgery

## 2018-12-13 ENCOUNTER — Other Ambulatory Visit: Payer: Self-pay

## 2018-12-13 ENCOUNTER — Encounter: Payer: Self-pay | Admitting: Orthopaedic Surgery

## 2018-12-13 ENCOUNTER — Ambulatory Visit (INDEPENDENT_AMBULATORY_CARE_PROVIDER_SITE_OTHER): Payer: Medicare Other | Admitting: Orthopaedic Surgery

## 2018-12-13 VITALS — BP 124/76 | HR 61 | Ht 70.0 in | Wt 170.0 lb

## 2018-12-13 DIAGNOSIS — M25511 Pain in right shoulder: Secondary | ICD-10-CM | POA: Diagnosis not present

## 2018-12-13 DIAGNOSIS — F17210 Nicotine dependence, cigarettes, uncomplicated: Secondary | ICD-10-CM

## 2018-12-13 DIAGNOSIS — G8929 Other chronic pain: Secondary | ICD-10-CM | POA: Diagnosis not present

## 2018-12-13 MED ORDER — NAPROXEN 500 MG PO TABS: ORAL_TABLET | ORAL | 5 refills | Status: DC

## 2018-12-13 NOTE — Progress Notes (Signed)
PROCEDURE NOTE:  The patient request injection, verbal consent was obtained.  The right shoulder was prepped appropriately after time out was performed.   Sterile technique was observed and injection of 1 cc of Depo-Medrol 40 mg with several cc's of plain xylocaine. Anesthesia was provided by ethyl chloride and a 20-gauge needle was used to inject the shoulder area. A posterior approach was used.  The injection was tolerated well.  A band aid dressing was applied.  The patient was advised to apply ice later today and tomorrow to the injection sight as needed.  I called in 90 day supply of naprosyn 500, one bid pc  Return in one month.  Call if any problem.  Precautions discussed.   Electronically Signed Darreld Mclean, MD 3/17/20202:20 PM

## 2018-12-20 ENCOUNTER — Telehealth: Payer: Self-pay | Admitting: Orthopaedic Surgery

## 2018-12-20 NOTE — Telephone Encounter (Signed)
Peter Campbell called and said that 3 days ago he bent over to pick up something and hurt his back.  He said now he cant bend over or straighten out.  He states he has to roll to get up off the bed.    He states he has been using heating pad, taking Naprosyn and pain medication   He wanted to know if he could make an appointment, or talk to Dr. Hilda Lias about what to do.  I told him that I would have to send message to Dr. Hilda Lias and get his input.  I doubled checked his phone number and told him that one of [place] would call him back.

## 2018-12-21 ENCOUNTER — Telehealth: Payer: Self-pay | Admitting: Orthopaedic Surgery

## 2018-12-21 MED ORDER — TIZANIDINE HCL 4 MG PO TABS: ORAL_TABLET | ORAL | 3 refills | Status: DC

## 2018-12-21 MED ORDER — PREDNISONE 10 MG (21) PO TBPK: ORAL_TABLET | ORAL | 1 refills | Status: DC

## 2018-12-21 NOTE — Telephone Encounter (Signed)
Patient states whatever you think is best, it seems to be getting worse. Prednisone, muscle relaxants or both.  PATIENT USES [facility]

## 2018-12-21 NOTE — Telephone Encounter (Signed)
I can call in some prednisone.  He is on pain medicine.  I can call in muscle relaxant also.  Let me know.

## 2018-12-27 ENCOUNTER — Ambulatory Visit: Payer: Medicare Other | Admitting: Orthopaedic Surgery

## 2018-12-29 ENCOUNTER — Telehealth: Payer: Self-pay | Admitting: Orthopaedic Surgery

## 2018-12-29 DIAGNOSIS — M25511 Pain in right shoulder: Principal | ICD-10-CM

## 2018-12-29 DIAGNOSIS — G8929 Other chronic pain: Secondary | ICD-10-CM

## 2018-12-29 MED ORDER — HYDROCODONE-ACETAMINOPHEN 5-325 MG PO TABS: ORAL_TABLET | ORAL | 0 refills | Status: DC

## 2018-12-29 NOTE — Telephone Encounter (Signed)
Patient requests refill on Hydrocodone/Acetaminophen 5-325  Mgs.  Qty  75 ° °Sig: One tablet every four hours as needed for pain. ° °Patient states he uses [facility] °

## 2019-01-10 ENCOUNTER — Other Ambulatory Visit: Payer: Self-pay

## 2019-01-10 ENCOUNTER — Ambulatory Visit (INDEPENDENT_AMBULATORY_CARE_PROVIDER_SITE_OTHER): Payer: Medicare Other

## 2019-01-10 ENCOUNTER — Ambulatory Visit (INDEPENDENT_AMBULATORY_CARE_PROVIDER_SITE_OTHER): Payer: Medicare Other | Admitting: Orthopaedic Surgery

## 2019-01-10 ENCOUNTER — Encounter: Payer: Self-pay | Admitting: Orthopaedic Surgery

## 2019-01-10 VITALS — BP 131/78 | HR 85 | Ht 70.0 in | Wt 170.0 lb

## 2019-01-10 DIAGNOSIS — M25511 Pain in right shoulder: Secondary | ICD-10-CM

## 2019-01-10 DIAGNOSIS — M5410 Radiculopathy, site unspecified: Secondary | ICD-10-CM

## 2019-01-10 DIAGNOSIS — F17210 Nicotine dependence, cigarettes, uncomplicated: Secondary | ICD-10-CM

## 2019-01-10 DIAGNOSIS — G8929 Other chronic pain: Secondary | ICD-10-CM | POA: Diagnosis not present

## 2019-01-10 DIAGNOSIS — I2510 Atherosclerotic heart disease of native coronary artery without angina pectoris: Secondary | ICD-10-CM | POA: Diagnosis not present

## 2019-01-10 MED ORDER — TIZANIDINE HCL 4 MG PO TABS: ORAL_TABLET | ORAL | 3 refills | Status: DC

## 2019-01-10 MED ORDER — PREDNISONE 5 MG (21) PO TBPK: ORAL_TABLET | ORAL | 0 refills | Status: DC

## 2019-01-10 MED ORDER — OXYCODONE-ACETAMINOPHEN 7.5-325 MG PO TABS: ORAL_TABLET | ORAL | 0 refills | Status: DC

## 2019-01-10 NOTE — Progress Notes (Signed)
Patient GM:WNUUV Peter Campbell, male DOB:08-Mar-1956, 63 y.o. OZD:[id number]  Chief Complaint  Patient presents with  . Shoulder Pain  . Back Pain    back pain into left leg     HPI  Peter Campbell is a 63 y.o. male who has chronic right shoulder pain.  He has developed acute left sided lower back pain with sciatica.  He lifted something and it locked up his back. He took his pain medicine and the dose pack I called in.  He is only a little better.  He hurts and the radiation of pain is still there.  He has no weakness.  He has no other trauma.   Body mass index is 24.39 kg/m.  ROS  Review of Systems  Constitutional: Positive for activity change.  Respiratory: Positive for shortness of breath. Negative for cough.   Musculoskeletal: Positive for arthralgias and gait problem.  All other systems reviewed and are negative.   All other systems reviewed and are negative.  The following is a summary of the past history medically, past history surgically, known current medicines, social history and family history.  This information is gathered electronically by the computer from prior information and documentation.  I review this each visit and have found including this information at this point in the chart is beneficial and informative.    Past Medical History:  Diagnosis Date  . Asthma   . Chronic back pain   . Collagen vascular disease (HCC)   . Colonic mass    History, s/p removal; per patient.  . Coronary atherosclerosis of native coronary artery    Dense coronary atherosclerosis by chest CT November 2014   . Essential hypertension, benign   . Essential tremor   . History of chicken pox   . History of [place] measles   . Sciatica     Past Surgical History:  Procedure Laterality Date  . ABDOMINAL SURGERY    . CIRCUMCISION    . COLON SURGERY     for mass removal  . COLONOSCOPY WITH PROPOFOL N/A 05/03/2017   Procedure: COLONOSCOPY WITH PROPOFOL;  Surgeon: Corbin Ade, MD;   [facility]: [facility];  Service: Endoscopy;  Laterality: N/A;  1015  . POLYPECTOMY  05/03/2017   Procedure: POLYPECTOMY;  Surgeon: Corbin Ade, MD;  Location: [facility];  Service: Endoscopy;;  descending colon and rectal  . SHOULDER ACROMIOPLASTY Right 06/12/2014   Procedure: RIGHT SHOULDER ACROMIOPLASTY;  Surgeon: Darreld Mclean, MD;  Location: [facility];  Service: Orthopedics;  Laterality: Right;  . SHOULDER OPEN ROTATOR CUFF REPAIR Right 06/12/2014   Procedure: OPEN REPAIR ROTATOR CUFF RIGHT ;  Surgeon: Darreld Mclean, MD;  Location: [facility];  Service: Orthopedics;  Laterality: Right;  . TONSILLECTOMY      Family History  Problem Relation Age of Onset  . COPD Father   . Cancer Father   . Cancer Mother   . Heart attack Brother   . Epilepsy Sister   . Hypertension Sister   . Colon cancer Neg Hx     Social History Social History   Tobacco Use  . Smoking status: Current Every Day Smoker    Packs/day: 1.00    Years: 21.00    Pack years: 21.00    Types: Cigarettes    Start date: 08/28/1969  . Smokeless tobacco: Never Used  Substance Use Topics  . Alcohol use: No    Alcohol/week: 0.0 standard drinks  . Drug use: No    Types:  Marijuana    No Known Allergies  Current Outpatient Medications  Medication Sig Dispense Refill  . albuterol (PROVENTIL HFA;VENTOLIN HFA) 108 (90 Base) MCG/ACT inhaler Inhale 1-2 puffs into the lungs every 6 (six) hours as needed for wheezing or shortness of breath.    Marland Kitchen. amLODipine (NORVASC) 5 MG tablet Take 1 tablet (5 mg total) by mouth daily. 90 tablet 3  . aspirin EC 81 MG tablet Take 1 tablet (81 mg total) by mouth daily. 90 tablet 3  . gabapentin (NEURONTIN) 600 MG tablet Take 600 mg by mouth 3 (three) times daily.     Marland Kitchen. ipratropium-albuterol (DUONEB) 0.5-2.5 (3) MG/3ML SOLN Take 3 mLs by nebulization every 6 (six) hours as needed. (Patient taking differently: Take 3 mLs by nebulization every 6 (six) hours as needed (shortness of breath). ) 360 mL  0  . linaclotide (LINZESS) 145 MCG CAPS capsule Take one capsule daily alternating with two capsules every other day    . metoprolol succinate (TOPROL-XL) 25 MG 24 hr tablet Take 1 tablet (25 mg total) by mouth 2 (two) times daily. 180 tablet 3  . naproxen (NAPROSYN) 500 MG tablet TAKE (1) TABLET TWICE A DAY WITH FOOD---BREAKFAST AND SUPPER. 180 tablet 5  . nitroGLYCERIN (NITROSTAT) 0.4 MG SL tablet Place 1 tablet (0.4 mg total) under the tongue every 5 (five) minutesas needed for chest pain. 25 tablet 3  . temazepam (RESTORIL) 15 MG capsule Take 45 mg by mouth at bedtime.     Marland Kitchen. tiZANidine (ZANAFLEX) 4 MG tablet One by mouth twice a day as needed for spasm 60 tablet 3  . oxyCODONE-acetaminophen (PERCOCET) 7.5-325 MG tablet One tablet every six hours as needed for pain.  14 day limit. 56 tablet 0  . predniSONE (STERAPRED UNI-PAK 21 TAB) 5 MG (21) TBPK tablet Take 6 pills first day; 5 pills second day; 4 pills third day; 3 pills fourth day; 2 pills next day and 1 pill last day. 21 tablet 0   No current facility-administered medications for this visit.      Physical Exam  Blood pressure 131/78, pulse 85, height 5\' 10"  (1.778 m), weight 170 lb (77.1 kg).  Constitutional: overall normal hygiene, normal nutrition, well developed, normal grooming, normal body habitus. Assistive device:none  Musculoskeletal: gait and station Limp left, muscle tone and strength are normal, no tremors or atrophy is present.  .  Neurological: coordination overall normal.  Deep tendon reflex/nerve stretch intact.  Sensation normal.  Cranial nerves II-XII intact.   Skin:   Normal overall no scars, lesions, ulcers or rashes. No psoriasis.  Psychiatric: Alert and oriented x 3.  Recent memory intact, remote memory unclear.  Normal mood and affect. Well groomed.  Good eye contact.  Cardiovascular: overall no swelling, no varicosities, no edema bilaterally, normal temperatures of the legs and arms, no clubbing, cyanosis  and good capillary refill.  Lymphatic: palpation is normal.  Spine/Pelvis examination:  Inspection:  Overall, sacoiliac joint benign and hips tender; without crepitus or defects.   Thoracic spine inspection: Alignment normal without kyphosis present   Lumbar spine inspection:  Alignment  with normal lumbar lordosis, without scoliosis apparent.   Thoracic spine palpation:  without tenderness of spinal processes   Lumbar spine palpation: with tenderness of lumbar area; with tightness of lumbar muscles    Range of Motion:   Lumbar flexion, forward flexion is 20 with pain or tenderness    Lumbar extension is 0 with pain or tenderness   Left lateral bend  is Abnormal- 5  with pain or tenderness   Right lateral bend is Abnormal- 5 with pain or tenderness   Straight leg raising is Normal   Strength & tone: Normal   Stability overall normal stability   All other systems reviewed and are negative    X-rays were done of the lumbar spine, reported separately.  The patient has been educated about the nature of the problem(s) and counseled on treatment options.  The patient appeared to understand what I have discussed and is in agreement with it.  Encounter Diagnoses  Name Primary?  . Back pain with left-sided radiculopathy Yes  . Chronic right shoulder pain   . Cigarette nicotine dependence without complication     PLAN Call if any problems.  Precautions discussed.  Continue current medications. I will add prednisone dose pack and change pain medicine to percocet.  Return to clinic 1 week  PROCEDURE NOTE:  The patient request injection, verbal consent was obtained.  The right shoulder was prepped appropriately after time out was performed.   Sterile technique was observed and injection of 1 cc of Depo-Medrol 40 mg with several cc's of plain xylocaine. Anesthesia was provided by ethyl chloride and a 20-gauge needle was used to inject the shoulder area. A posterior approach was  used.  The injection was tolerated well.  A band aid dressing was applied.  The patient was advised to apply ice later today and tomorrow to the injection sight as needed.    Electronically Signed Darreld Mclean, MD 4/14/202010:09 AM

## 2019-01-17 ENCOUNTER — Other Ambulatory Visit: Payer: Self-pay

## 2019-01-17 ENCOUNTER — Encounter: Payer: Self-pay | Admitting: Orthopaedic Surgery

## 2019-01-17 ENCOUNTER — Ambulatory Visit (INDEPENDENT_AMBULATORY_CARE_PROVIDER_SITE_OTHER): Payer: Medicare Other | Admitting: Orthopaedic Surgery

## 2019-01-17 DIAGNOSIS — F17210 Nicotine dependence, cigarettes, uncomplicated: Secondary | ICD-10-CM

## 2019-01-17 DIAGNOSIS — M5410 Radiculopathy, site unspecified: Secondary | ICD-10-CM

## 2019-01-17 DIAGNOSIS — I2510 Atherosclerotic heart disease of native coronary artery without angina pectoris: Secondary | ICD-10-CM

## 2019-01-17 DIAGNOSIS — M25511 Pain in right shoulder: Secondary | ICD-10-CM

## 2019-01-17 DIAGNOSIS — G8929 Other chronic pain: Secondary | ICD-10-CM

## 2019-01-17 NOTE — Progress Notes (Signed)
Virtual Visit via Telephone Note  I connected with Peter Campbell on 01/17/19 at 10:00 AM EDT by telephone and verified that I am speaking with the correct person using two identifiers.   I discussed the limitations, risks, security and privacy concerns of performing an evaluation and management service by telephone and the availability of in person appointments. I also discussed with the patient that there may be a patient responsible charge related to this service. The patient expressed understanding and agreed to proceed.   History of Present Illness: He has lower back pain with radiation down the left leg.  His pain is still there and he still has some paresthesias but it is less after taking the prednisone and the increased pain medicine.  He is taking his Naprosyn now.  He has no new trauma.  He has no weakness.  His right shoulder is still tender at times.   Observations/Objective: Per above  Assessment and Plan: Encounter Diagnoses  Name Primary?  . Back pain with left-sided radiculopathy Yes  . Chronic right shoulder pain   . Cigarette nicotine dependence without complication      Follow Up Instructions: He is to continue his present medicine.  I feel he needs a MRI as I am concerned about HNP in the lower back.  MRI are not being done now secondary to COVID-19.  I will have virtual visit in two weeks.  Call if any problem.  Precautions discussed.      I discussed the assessment and treatment plan with the patient. The patient was provided an opportunity to ask questions and all were answered. The patient agreed with the plan and demonstrated an understanding of the instructions.   The patient was advised to call back or seek an in-person evaluation if the symptoms worsen or if the condition fails to improve as anticipated.  I provided 11 minutes of non-face-to-face time during this encounter.   Darreld Mclean, MD

## 2019-01-23 ENCOUNTER — Telehealth: Payer: Self-pay | Admitting: Orthopaedic Surgery

## 2019-01-23 MED ORDER — OXYCODONE-ACETAMINOPHEN 7.5-325 MG PO TABS: ORAL_TABLET | ORAL | 0 refills | Status: DC

## 2019-01-23 NOTE — Telephone Encounter (Signed)
Patient requests refill on Oxycodone/Acetaminophen 7.5-325 mgs.   Qty  56   Sig: One tablet every six hours as needed for pain. 14 day limit.  Patient states he uses [employer]

## 2019-01-31 ENCOUNTER — Telehealth: Payer: Self-pay | Admitting: Radiology

## 2019-01-31 ENCOUNTER — Ambulatory Visit (INDEPENDENT_AMBULATORY_CARE_PROVIDER_SITE_OTHER): Payer: Medicare Other | Admitting: Orthopaedic Surgery

## 2019-01-31 ENCOUNTER — Other Ambulatory Visit: Payer: Self-pay

## 2019-01-31 ENCOUNTER — Encounter: Payer: Self-pay | Admitting: Orthopaedic Surgery

## 2019-01-31 DIAGNOSIS — M25511 Pain in right shoulder: Secondary | ICD-10-CM | POA: Diagnosis not present

## 2019-01-31 DIAGNOSIS — M5410 Radiculopathy, site unspecified: Secondary | ICD-10-CM

## 2019-01-31 DIAGNOSIS — F17210 Nicotine dependence, cigarettes, uncomplicated: Secondary | ICD-10-CM

## 2019-01-31 DIAGNOSIS — G8929 Other chronic pain: Secondary | ICD-10-CM | POA: Diagnosis not present

## 2019-01-31 DIAGNOSIS — I2510 Atherosclerotic heart disease of native coronary artery without angina pectoris: Secondary | ICD-10-CM | POA: Diagnosis not present

## 2019-01-31 NOTE — Addendum Note (Signed)
Addended byCaffie Damme on: 01/31/2019 11:32 AM   Modules accepted: Orders

## 2019-01-31 NOTE — Telephone Encounter (Signed)
June 4th 1:30 arrival for 2 pm scan MRI lumbar

## 2019-01-31 NOTE — Progress Notes (Signed)
Virtual Visit via Telephone Note  I connected with Peter Campbell on 01/31/19 at 10:10 AM EDT by telephone and verified that I am speaking with the correct person using two identifiers.   I discussed the limitations, risks, security and privacy concerns of performing an evaluation and management service by telephone and the availability of in person appointments. I also discussed with the patient that there may be a patient responsible charge related to this service. The patient expressed understanding and agreed to proceed.   History of Present Illness: He has continued pain of the left lower back with marked sciatica.  I am concerned about HNP.  He needs MRI.  He is taking his medicine and doing his exercises for his shoulder.  The dose pack only helped a little.   Observations/Objective: Per above  Assessment and Plan: Encounter Diagnoses  Name Primary?  . Back pain with left-sided radiculopathy Yes  . Chronic right shoulder pain   . Cigarette nicotine dependence without complication      Follow Up Instructions: Two weeks.  Continue pain medicine.   I discussed the assessment and treatment plan with the patient. The patient was provided an opportunity to ask questions and all were answered. The patient agreed with the plan and demonstrated an understanding of the instructions.   The patient was advised to call back or seek an in-person evaluation if the symptoms worsen or if the condition fails to improve as anticipated.  I provided 11 minutes of non-face-to-face time during this encounter.   Darreld Mclean, MD

## 2019-02-14 ENCOUNTER — Ambulatory Visit (HOSPITAL_COMMUNITY)
Admission: RE | Admit: 2019-02-14 | Discharge: 2019-02-14 | Disposition: A | Discharge status: Home / Self Care | Payer: Medicare Other | Source: Ambulatory Visit | Attending: Orthopaedic Surgery | Admitting: Orthopaedic Surgery

## 2019-02-14 ENCOUNTER — Other Ambulatory Visit: Payer: Self-pay

## 2019-02-14 DIAGNOSIS — M5410 Radiculopathy, site unspecified: Secondary | ICD-10-CM | POA: Insufficient documentation

## 2019-02-15 ENCOUNTER — Ambulatory Visit (INDEPENDENT_AMBULATORY_CARE_PROVIDER_SITE_OTHER): Payer: Medicare Other | Admitting: Orthopaedic Surgery

## 2019-02-15 ENCOUNTER — Other Ambulatory Visit: Payer: Self-pay | Admitting: Orthopaedic Surgery

## 2019-02-15 ENCOUNTER — Encounter: Payer: Self-pay | Admitting: Orthopaedic Surgery

## 2019-02-15 DIAGNOSIS — M5410 Radiculopathy, site unspecified: Secondary | ICD-10-CM | POA: Diagnosis not present

## 2019-02-15 DIAGNOSIS — I2510 Atherosclerotic heart disease of native coronary artery without angina pectoris: Secondary | ICD-10-CM

## 2019-02-15 DIAGNOSIS — M25511 Pain in right shoulder: Secondary | ICD-10-CM

## 2019-02-15 DIAGNOSIS — F17210 Nicotine dependence, cigarettes, uncomplicated: Secondary | ICD-10-CM

## 2019-02-15 DIAGNOSIS — G8929 Other chronic pain: Secondary | ICD-10-CM

## 2019-02-15 NOTE — Progress Notes (Signed)
Virtual Visit via Telephone Note  I connected with Peter Campbell on 02/15/19 at 10:10 AM EDT by telephone and verified that I am speaking with the correct person using two identifiers.  Location: Patient: home Provider: home   I discussed the limitations, risks, security and privacy concerns of performing an evaluation and management service by telephone and the availability of in person appointments. I also discussed with the patient that there may be a patient responsible charge related to this service. The patient expressed understanding and agreed to proceed.   History of Present Illness: His back is worse.  He had MRI done yesterday which showed: IMPRESSION: 1. L5-S1 left paracentral extrusion compressing the left S1 nerve root. 2. 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  He will need to see neurosurgeon for this.  We will make appointment.  He wants to come in for injection of his shoulder.  I told him to come tomorrow.   Observations/Objective: Per above  Assessment and Plan: Encounter Diagnoses  Name Primary?  . Back pain with left-sided radiculopathy Yes  . Chronic right shoulder pain   . Cigarette nicotine dependence without complication      Follow Up Instructions: To neurosurgeon, office tomorrow for injection.   I discussed the assessment and treatment plan with the patient. The patient was provided an opportunity to ask questions and all were answered. The patient agreed with the plan and demonstrated an understanding of the instructions.   The patient was advised to call back or seek an in-person evaluation if the symptoms worsen or if the condition fails to improve as anticipated.  I provided 8 minutes of non-face-to-face time during this encounter.   Darreld Mclean, MD

## 2019-02-16 ENCOUNTER — Other Ambulatory Visit: Payer: Self-pay

## 2019-02-16 ENCOUNTER — Ambulatory Visit (INDEPENDENT_AMBULATORY_CARE_PROVIDER_SITE_OTHER): Payer: Medicare Other | Admitting: Orthopaedic Surgery

## 2019-02-16 ENCOUNTER — Encounter: Payer: Self-pay | Admitting: Orthopaedic Surgery

## 2019-02-16 VITALS — Temp 97.5°F

## 2019-02-16 DIAGNOSIS — M25511 Pain in right shoulder: Secondary | ICD-10-CM | POA: Diagnosis not present

## 2019-02-16 DIAGNOSIS — M5020 Other cervical disc displacement, unspecified cervical region: Secondary | ICD-10-CM | POA: Insufficient documentation

## 2019-02-16 DIAGNOSIS — G8929 Other chronic pain: Secondary | ICD-10-CM

## 2019-02-16 DIAGNOSIS — M5410 Radiculopathy, site unspecified: Secondary | ICD-10-CM

## 2019-02-16 MED ORDER — OXYCODONE-ACETAMINOPHEN 7.5-325 MG PO TABS: ORAL_TABLET | ORAL | 0 refills | Status: DC

## 2019-02-16 NOTE — Progress Notes (Signed)
PROCEDURE NOTE:  The patient request injection, verbal consent was obtained.  The right shoulder was prepped appropriately after time out was performed.   Sterile technique was observed and injection of 1 cc of Depo-Medrol 40 mg with several cc's of plain xylocaine. Anesthesia was provided by ethyl chloride and a 20-gauge needle was used to inject the shoulder area. A posterior approach was used.  The injection was tolerated well.  A band aid dressing was applied.  The patient was advised to apply ice later today and tomorrow to the injection sight as needed.  He has appointment to see neurosurgeon this afternoon about his back.  Encounter Diagnoses  Name Primary?  . Chronic right shoulder pain   . Back pain with left-sided radiculopathy Yes   Electronically Signed Darreld Mclean, MD 5/21/202010:00 AM

## 2019-03-02 ENCOUNTER — Ambulatory Visit (HOSPITAL_COMMUNITY): Payer: Medicare Other

## 2019-03-21 ENCOUNTER — Other Ambulatory Visit: Payer: Self-pay

## 2019-03-21 ENCOUNTER — Ambulatory Visit (INDEPENDENT_AMBULATORY_CARE_PROVIDER_SITE_OTHER): Payer: Medicare Other | Admitting: Orthopaedic Surgery

## 2019-03-21 ENCOUNTER — Encounter: Payer: Self-pay | Admitting: Orthopaedic Surgery

## 2019-03-21 VITALS — Temp 97.4°F | Ht 70.0 in | Wt 164.0 lb

## 2019-03-21 DIAGNOSIS — M25511 Pain in right shoulder: Secondary | ICD-10-CM

## 2019-03-21 DIAGNOSIS — G8929 Other chronic pain: Secondary | ICD-10-CM | POA: Diagnosis not present

## 2019-03-21 DIAGNOSIS — F17210 Nicotine dependence, cigarettes, uncomplicated: Secondary | ICD-10-CM | POA: Diagnosis not present

## 2019-03-21 MED ORDER — OXYCODONE-ACETAMINOPHEN 7.5-325 MG PO TABS: ORAL_TABLET | ORAL | 0 refills | Status: DC

## 2019-03-21 NOTE — Progress Notes (Signed)
PROCEDURE NOTE:  The patient request injection, verbal consent was obtained.  The right shoulder was prepped appropriately after time out was performed.   Sterile technique was observed and injection of 1 cc of Depo-Medrol 40 mg with several cc's of plain xylocaine. Anesthesia was provided by ethyl chloride and a 20-gauge needle was used to inject the shoulder area. A posterior approach was used.  The injection was tolerated well.  A band aid dressing was applied.  The patient was advised to apply ice later today and tomorrow to the injection sight as needed.  I will see him in one month.  He had back surgery and is doing much better  I have reviewed the [place] web site prior to prescribing narcotic medicine for this patient.   Electronically Signed [name], MD 6/23/20209:29 AM

## 2019-03-21 NOTE — Patient Instructions (Signed)

## 2019-04-18 ENCOUNTER — Encounter: Payer: Self-pay | Admitting: Orthopaedic Surgery

## 2019-04-18 ENCOUNTER — Ambulatory Visit (INDEPENDENT_AMBULATORY_CARE_PROVIDER_SITE_OTHER): Payer: Medicare Other | Admitting: Orthopaedic Surgery

## 2019-04-18 ENCOUNTER — Other Ambulatory Visit: Payer: Self-pay

## 2019-04-18 VITALS — Temp 98.2°F

## 2019-04-18 DIAGNOSIS — F17210 Nicotine dependence, cigarettes, uncomplicated: Secondary | ICD-10-CM

## 2019-04-18 DIAGNOSIS — M25511 Pain in right shoulder: Secondary | ICD-10-CM | POA: Diagnosis not present

## 2019-04-18 DIAGNOSIS — G8929 Other chronic pain: Secondary | ICD-10-CM

## 2019-04-18 MED ORDER — OXYCODONE-ACETAMINOPHEN 7.5-325 MG PO TABS: ORAL_TABLET | ORAL | 0 refills | Status: DC

## 2019-04-18 NOTE — Progress Notes (Signed)
PROCEDURE NOTE:  The patient request injection, verbal consent was obtained.  The right shoulder was prepped appropriately after time out was performed.   Sterile technique was observed and injection of 1 cc of Depo-Medrol 40 mg with several cc's of plain xylocaine. Anesthesia was provided by ethyl chloride and a 20-gauge needle was used to inject the shoulder area. A posterior approach was used.  The injection was tolerated well.  A band aid dressing was applied.  The patient was advised to apply ice later today and tomorrow to the injection sight as needed.  I have reviewed the [place] web site prior to prescribing narcotic medicine for this patient.  The patient has read and signed an Opioid Treatment Agreement which has been scanned and added to the medical record.  The patient understands the agreement and agrees to abide with it.  The patient has chronic pain that is being treated with an opioid which relieves the pain.  The patient understands potential complications with chronic opioid treatment.   Return in one month.  Electronically Signed [name], MD 7/21/202010:31 AM

## 2019-04-22 ENCOUNTER — Other Ambulatory Visit: Payer: Self-pay | Admitting: Orthopaedic Surgery

## 2019-05-01 ENCOUNTER — Other Ambulatory Visit: Payer: Self-pay | Admitting: Internal Medicine

## 2019-05-12 ENCOUNTER — Telehealth: Payer: Self-pay | Admitting: Cardiology

## 2019-05-12 NOTE — Telephone Encounter (Signed)

## 2019-05-16 ENCOUNTER — Encounter: Payer: Self-pay | Admitting: Orthopaedic Surgery

## 2019-05-16 ENCOUNTER — Ambulatory Visit (INDEPENDENT_AMBULATORY_CARE_PROVIDER_SITE_OTHER): Payer: Medicare Other | Admitting: Orthopaedic Surgery

## 2019-05-16 ENCOUNTER — Other Ambulatory Visit: Payer: Self-pay

## 2019-05-16 ENCOUNTER — Emergency Department (HOSPITAL_COMMUNITY)
Admission: EM | Admit: 2019-05-16 | Discharge: 2019-05-16 | Disposition: A | Discharge status: Home / Self Care | Payer: Medicare Other | Attending: Emergency Medicine | Admitting: Emergency Medicine

## 2019-05-16 ENCOUNTER — Encounter (HOSPITAL_COMMUNITY): Payer: Self-pay | Admitting: Emergency Medicine

## 2019-05-16 DIAGNOSIS — R630 Anorexia: Secondary | ICD-10-CM | POA: Diagnosis not present

## 2019-05-16 DIAGNOSIS — I10 Essential (primary) hypertension: Secondary | ICD-10-CM | POA: Insufficient documentation

## 2019-05-16 DIAGNOSIS — R251 Tremor, unspecified: Secondary | ICD-10-CM | POA: Diagnosis not present

## 2019-05-16 DIAGNOSIS — Z7982 Long term (current) use of aspirin: Secondary | ICD-10-CM | POA: Diagnosis not present

## 2019-05-16 DIAGNOSIS — I2510 Atherosclerotic heart disease of native coronary artery without angina pectoris: Secondary | ICD-10-CM | POA: Insufficient documentation

## 2019-05-16 DIAGNOSIS — R451 Restlessness and agitation: Secondary | ICD-10-CM | POA: Diagnosis not present

## 2019-05-16 DIAGNOSIS — M25511 Pain in right shoulder: Secondary | ICD-10-CM | POA: Diagnosis not present

## 2019-05-16 DIAGNOSIS — F13930 Sedative, hypnotic or anxiolytic use, unspecified with withdrawal, uncomplicated: Secondary | ICD-10-CM

## 2019-05-16 DIAGNOSIS — F13230 Sedative, hypnotic or anxiolytic dependence with withdrawal, uncomplicated: Secondary | ICD-10-CM | POA: Diagnosis not present

## 2019-05-16 DIAGNOSIS — G8929 Other chronic pain: Secondary | ICD-10-CM | POA: Diagnosis not present

## 2019-05-16 DIAGNOSIS — Z79899 Other long term (current) drug therapy: Secondary | ICD-10-CM | POA: Diagnosis not present

## 2019-05-16 DIAGNOSIS — R110 Nausea: Secondary | ICD-10-CM | POA: Insufficient documentation

## 2019-05-16 DIAGNOSIS — F17210 Nicotine dependence, cigarettes, uncomplicated: Secondary | ICD-10-CM | POA: Diagnosis not present

## 2019-05-16 LAB — CBC WITH DIFFERENTIAL/PLATELET
Abs Immature Granulocytes: 0.1 10*3/uL — ABNORMAL HIGH (ref 0.00–0.07)
Basophils Absolute: 0.1 10*3/uL (ref 0.0–0.1)
Basophils Relative: 1 %
Eosinophils Absolute: 0.1 10*3/uL (ref 0.0–0.5)
Eosinophils Relative: 0 %
HCT: 46.9 % (ref 39.0–52.0)
Hemoglobin: 15.9 g/dL (ref 13.0–17.0)
Immature Granulocytes: 1 %
Lymphocytes Relative: 13 %
Lymphs Abs: 1.7 10*3/uL (ref 0.7–4.0)
MCH: 32.1 pg (ref 26.0–34.0)
MCHC: 33.9 g/dL (ref 30.0–36.0)
MCV: 94.7 fL (ref 80.0–100.0)
Monocytes Absolute: 0.9 10*3/uL (ref 0.1–1.0)
Monocytes Relative: 6 %
Neutro Abs: 11.1 10*3/uL — ABNORMAL HIGH (ref 1.7–7.7)
Neutrophils Relative %: 79 %
Platelets: 270 10*3/uL (ref 150–400)
RBC: 4.95 MIL/uL (ref 4.22–5.81)
RDW: 12.8 % (ref 11.5–15.5)
WBC: 14 10*3/uL — ABNORMAL HIGH (ref 4.0–10.5)
nRBC: 0 % (ref 0.0–0.2)

## 2019-05-16 LAB — RAPID URINE DRUG SCREEN, HOSP PERFORMED
Amphetamines: NOT DETECTED
Barbiturates: NOT DETECTED
Benzodiazepines: POSITIVE — AB
Cocaine: NOT DETECTED
Opiates: NOT DETECTED
Tetrahydrocannabinol: POSITIVE — AB

## 2019-05-16 LAB — BASIC METABOLIC PANEL
Anion gap: 9 (ref 5–15)
BUN: 21 mg/dL (ref 8–23)
CO2: 24 mmol/L (ref 22–32)
Calcium: 9.5 mg/dL (ref 8.9–10.3)
Chloride: 104 mmol/L (ref 98–111)
Creatinine, Ser: 0.62 mg/dL (ref 0.61–1.24)
GFR calc Af Amer: >60 mL/min (ref >60)
GFR calc non Af Amer: >60 mL/min (ref >60)
Glucose, Bld: 111 mg/dL — ABNORMAL HIGH (ref 70–99)
Potassium: 3.8 mmol/L (ref 3.5–5.1)
Sodium: 137 mmol/L (ref 135–145)

## 2019-05-16 MED ORDER — HYDROXYZINE HCL 25 MG PO TABS
25.0000 mg | ORAL_TABLET | Freq: Once | ORAL | Status: AC
  Administered 2019-05-16: 25 mg via ORAL
  Filled 2019-05-16: qty 1

## 2019-05-16 MED ORDER — CLONIDINE HCL 0.1 MG PO TABS: 0.1000 mg | ORAL_TABLET | Freq: Every day | ORAL | Status: DC

## 2019-05-16 MED ORDER — NAPROXEN 250 MG PO TABS: 500.0000 mg | ORAL_TABLET | Freq: Two times a day (BID) | ORAL | Status: DC | PRN

## 2019-05-16 MED ORDER — METHOCARBAMOL 500 MG PO TABS: 500.0000 mg | ORAL_TABLET | Freq: Three times a day (TID) | ORAL | Status: DC | PRN

## 2019-05-16 MED ORDER — HYDROXYZINE HCL 25 MG PO TABS: 25.0000 mg | ORAL_TABLET | Freq: Four times a day (QID) | ORAL | 0 refills | Status: DC | PRN

## 2019-05-16 MED ORDER — DICYCLOMINE HCL 10 MG PO CAPS: 20.0000 mg | ORAL_CAPSULE | Freq: Four times a day (QID) | ORAL | Status: DC | PRN

## 2019-05-16 MED ORDER — HYDROXYZINE HCL 25 MG PO TABS
25.0000 mg | ORAL_TABLET | Freq: Four times a day (QID) | ORAL | Status: DC | PRN
  Administered 2019-05-16: 22:00:00 25 mg via ORAL
  Filled 2019-05-16: qty 1

## 2019-05-16 MED ORDER — ONDANSETRON 4 MG PO TBDP: 4.0000 mg | ORAL_TABLET | Freq: Four times a day (QID) | ORAL | Status: DC | PRN

## 2019-05-16 MED ORDER — CLONIDINE HCL 0.1 MG PO TABS: ORAL_TABLET | ORAL | 0 refills | Status: DC

## 2019-05-16 MED ORDER — CLONIDINE HCL 0.1 MG PO TABS: 0.1000 mg | ORAL_TABLET | ORAL | Status: DC

## 2019-05-16 MED ORDER — CLONIDINE HCL 0.1 MG PO TABS
0.1000 mg | ORAL_TABLET | Freq: Four times a day (QID) | ORAL | Status: DC
  Administered 2019-05-16: 22:00:00 0.1 mg via ORAL
  Filled 2019-05-16: qty 1

## 2019-05-16 MED ORDER — OXYCODONE-ACETAMINOPHEN 5-325 MG PO TABS: 1.0000 | ORAL_TABLET | Freq: Four times a day (QID) | ORAL | 0 refills | Status: AC | PRN

## 2019-05-16 MED ORDER — CLONIDINE HCL 0.1 MG PO TABS
0.1000 mg | ORAL_TABLET | Freq: Once | ORAL | Status: AC
  Administered 2019-05-16: 0.1 mg via ORAL
  Filled 2019-05-16: qty 1

## 2019-05-16 NOTE — ED Provider Notes (Signed)
[facility] Provider Note   CSN: [id number] [facility]    History   Chief Complaint Chief Complaint  Patient presents with  . Drug Withdrawal    HPI Peter Campbell is a 63 y.o. male.     HPI   He complains of generalized ill-ease, anorexia, nausea, tremor, and agitation, since stopping Xanax, 3 days ago.  He states he has been using illicit Xanax, 6 mg a day, for 2 years preceding that.  He denies use of other illegal drugs, or alcohol problems.  He has chronic pain in back and right shoulder and  saw his orthopedist for a right shoulder injection, earlier today.  He denies fever, chills, change in chronic cough, chest pain, weakness or dizziness.  He is a cigarette smoker.  There are no other known modifying factors.  Past Medical History:  Diagnosis Date  . Asthma   . Chronic back pain   . Collagen vascular disease (HCC)   . Colonic mass    History, s/p removal; per patient.  . Coronary atherosclerosis of native coronary artery    Dense coronary atherosclerosis by chest CT November 2014   . Essential hypertension, benign   . Essential tremor   . History of chicken pox   . History of [place] measles   . Sciatica     Patient Active Problem List   Diagnosis Date Noted  . Abdominal pain 08/04/2017  . Constipation 01/11/2017  . History of colonic polyps 01/11/2017  . Right shoulder pain 12/03/2015  . Muscle weakness (generalized) 07/02/2014  . Decreased range of motion of right shoulder 07/02/2014  . Rotator cuff impingement syndrome of right shoulder 06/12/2014  . Diverticulosis 10/12/2013  . Coronary atherosclerosis of native coronary artery 09/26/2013  . Tobacco abuse 09/26/2013  . Essential hypertension, benign 09/25/2013  . Coronary artery calcification seen on CAT scan 08/17/2013  . History of alcohol abuse 08/17/2013  . History of syncope 08/17/2013  . Hypertension 08/17/2013  . Tremor, essential 08/17/2013     Past Surgical History:  Procedure Laterality Date  . ABDOMINAL SURGERY    . CIRCUMCISION    . COLON SURGERY     for mass removal  . COLONOSCOPY WITH PROPOFOL N/A 05/03/2017   Procedure: COLONOSCOPY WITH PROPOFOL;  Surgeon: Corbin Adeourk, Robert M, MD;  Location: [facility];  Service: Endoscopy;  Laterality: N/A;  1015  . POLYPECTOMY  05/03/2017   Procedure: POLYPECTOMY;  Surgeon: Corbin Adeourk, Robert M, MD;  Location: [facility];  Service: Endoscopy;;  descending colon and rectal  . SHOULDER ACROMIOPLASTY Right 06/12/2014   Procedure: RIGHT SHOULDER ACROMIOPLASTY;  Surgeon: Darreld McleanWayne Keeling, MD;  Location: [facility];  Service: Orthopedics;  Laterality: Right;  . SHOULDER OPEN ROTATOR CUFF REPAIR Right 06/12/2014   Procedure: OPEN REPAIR ROTATOR CUFF RIGHT ;  Surgeon: Darreld McleanWayne Keeling, MD;  Location: [facility];  Service: Orthopedics;  Laterality: Right;  . TONSILLECTOMY          Home Medications    Prior to Admission medications   Medication Sig Start Date End Date Taking? Authorizing Provider  albuterol (PROVENTIL HFA;VENTOLIN HFA) 108 (90 Base) MCG/ACT inhaler Inhale 1-2 puffs into the lungs every 6 (six) hours as needed for wheezing or shortness of breath.   Yes [provider]  amLODipine (NORVASC) 5 MG tablet Take 1 tablet (5 mg total) by mouth daily. 10/19/18  Yes Jonelle SidleMcDowell, Samuel G, MD  aspirin EC 81 MG tablet Take 1 tablet (  81 mg total) by mouth daily. 10/31/13  Yes Jonelle SidleMcDowell, Samuel G, MD  gabapentin (NEURONTIN) 600 MG tablet Take 600 mg by mouth 3 (three) times daily.  01/26/18  Yes [provider]  ipratropium-albuterol (DUONEB) 0.5-2.5 (3) MG/3ML SOLN Take 3 mLs by nebulization every 6 (six) hours as needed. Patient taking differently: Take 3 mLs by nebulization every 6 (six) hours as needed (shortness of breath).  06/19/15  Yes Delo, Riley Lamouglas, MD  LINZESS 145 MCG CAPS capsule TAKE 1 CAPSULE BY MOUTH ONCE DAILY. TAKE A SECOND CAPSULE BY MOUTH EVERY OTHER DAY. Patient taking differently:  Take 145 mcg by mouth daily before breakfast.  05/04/19  Yes Anice PaganiniGill, Eric A, NP  metoprolol succinate (TOPROL-XL) 25 MG 24 hr tablet Take 1 tablet (25 mg total) by mouth 2 (two) times daily. 10/19/18  Yes Jonelle SidleMcDowell, Samuel G, MD  naproxen (NAPROSYN) 500 MG tablet TAKE (1) TABLET TWICE A DAY WITH FOOD---BREAKFAST AND SUPPER. Patient taking differently: Take 500 mg by mouth 2 (two) times daily with a meal.  12/13/18  Yes Darreld McleanKeeling, Wayne, MD  nitroGLYCERIN (NITROSTAT) 0.4 MG SL tablet Place 1 tablet (0.4 mg total) under the tongue every 5 (five) minutesas needed for chest pain. 10/19/18  Yes Jonelle SidleMcDowell, Samuel G, MD  oxyCODONE-acetaminophen (PERCOCET/ROXICET) 5-325 MG tablet Take 1 tablet by mouth every 6 (six) hours as needed for up to 14 days for severe pain. One tablet every six hours for pain. 14 day limit 05/16/19 05/30/19 Yes Darreld McleanKeeling, Wayne, MD  temazepam (RESTORIL) 15 MG capsule Take 45 mg by mouth at bedtime.    Yes [provider]  tiZANidine (ZANAFLEX) 4 MG tablet TAKE 1 TABLET BY MOUTH TWICE DAILY AS NEEDED FOR MUSCLE SPASM Patient taking differently: Take 4 mg by mouth 2 (two) times daily as needed for muscle spasms.  04/25/19  Yes Darreld McleanKeeling, Wayne, MD  cloNIDine (CATAPRES) 0.1 MG tablet 1 tab po tid x 2 days, then bid x 2 days, then once daily x 2 days 05/16/19   Mancel BaleWentz, Lachelle Rissler, MD  hydrOXYzine (ATARAX/VISTARIL) 25 MG tablet Take 1 tablet (25 mg total) by mouth every 6 (six) hours as needed for anxiety. 05/16/19   Mancel BaleWentz, Joaovictor Krone, MD    Family History Family History  Problem Relation Age of Onset  . COPD Father   . Cancer Father   . Cancer Mother   . Heart attack Brother   . Epilepsy Sister   . Hypertension Sister   . Colon cancer Neg Hx     Social History Social History   Tobacco Use  . Smoking status: Current Every Day Smoker    Packs/day: 1.00    Years: 21.00    Pack years: 21.00    Types: Cigarettes    Start date: 08/28/1969  . Smokeless tobacco: Never Used  Substance Use Topics   . Alcohol use: No    Alcohol/week: 0.0 standard drinks  . Drug use: No    Types: Marijuana     Allergies   Fentanyl   Review of Systems Review of Systems  All other systems reviewed and are negative.    Physical Exam Updated Vital Signs BP (!) 154/86   Pulse 91   Temp 97.8 F (36.6 C)   Resp 18   Ht 5\' 10"  (1.778 m)   Wt 74.8 kg   SpO2 96%   BMI 23.68 kg/m   Physical Exam Vitals signs and nursing note reviewed.  Constitutional:      General: He is in acute  distress.     Appearance: He is well-developed. He is not ill-appearing, toxic-appearing or diaphoretic.     Comments: Somewhat disheveled  HENT:     Head: Normocephalic and atraumatic.     Right Ear: External ear normal.     Left Ear: External ear normal.  Eyes:     Conjunctiva/sclera: Conjunctivae normal.     Pupils: Pupils are equal, round, and reactive to light.  Neck:     Musculoskeletal: Normal range of motion and neck supple.     Trachea: Phonation normal.  Cardiovascular:     Rate and Rhythm: Normal rate and regular rhythm.     Heart sounds: Normal heart sounds.  Pulmonary:     Effort: Pulmonary effort is normal.     Breath sounds: Normal breath sounds.  Abdominal:     Palpations: Abdomen is soft.     Tenderness: There is no abdominal tenderness.  Musculoskeletal: Normal range of motion.  Skin:    General: Skin is warm and dry.  Neurological:     Mental Status: He is alert and oriented to person, place, and time.     Cranial Nerves: No cranial nerve deficit.     Sensory: No sensory deficit.     Motor: No abnormal muscle tone.     Coordination: Coordination normal.     Comments: No dysarthria, or aphasia.  Mild tremor present.  Psychiatric:        Mood and Affect: Mood normal.        Behavior: Behavior normal.        Thought Content: Thought content normal.        Judgment: Judgment normal.      ED Treatments / Results  Labs (all labs ordered are listed, but only abnormal results  are displayed) Labs Reviewed  BASIC METABOLIC PANEL - Abnormal; Notable for the following components:      Result Value   Glucose, Bld 111 (*)    All other components within normal limits  CBC WITH DIFFERENTIAL/PLATELET - Abnormal; Notable for the following components:   WBC 14.0 (*)    Neutro Abs 11.1 (*)    Abs Immature Granulocytes 0.10 (*)    All other components within normal limits  RAPID URINE DRUG SCREEN, HOSP PERFORMED - Abnormal; Notable for the following components:   Benzodiazepines POSITIVE (*)    Tetrahydrocannabinol POSITIVE (*)    All other components within normal limits    EKG None  Radiology No results found.  Procedures Procedures (including critical care time)  Medications Ordered in ED Medications  dicyclomine (BENTYL) capsule 20 mg (has no administration in time range)  hydrOXYzine (ATARAX/VISTARIL) tablet 25 mg (25 mg Oral Given 05/16/19 2136)  methocarbamol (ROBAXIN) tablet 500 mg (has no administration in time range)  naproxen (NAPROSYN) tablet 500 mg (has no administration in time range)  ondansetron (ZOFRAN-ODT) disintegrating tablet 4 mg (has no administration in time range)  cloNIDine (CATAPRES) tablet 0.1 mg (0.1 mg Oral Given 05/16/19 2135)    Followed by  cloNIDine (CATAPRES) tablet 0.1 mg (has no administration in time range)    Followed by  cloNIDine (CATAPRES) tablet 0.1 mg (has no administration in time range)  hydrOXYzine (ATARAX/VISTARIL) tablet 25 mg (has no administration in time range)  cloNIDine (CATAPRES) tablet 0.1 mg (has no administration in time range)     Initial Impression / Assessment and Plan / ED Course  I have reviewed the triage vital signs and the nursing notes.  Pertinent labs &  imaging results that were available during my care of the patient were reviewed by me and considered in my medical decision making (see chart for details).  Clinical Course as of May 15 2302  Tue May 16, 2019  2247 Normal except glucose  elevated  Basic metabolic panel(!) [EW]  8676 Normal except benzodiazepine and THC, present  Urine rapid drug screen (hosp performed)(!) [EW]  2247 Normal except white count elevated  CBC with Differential(!) [EW]    Clinical Course User Index [EW] Daleen Bo, MD        Patient Vitals for the past 24 hrs:  BP Temp Pulse Resp SpO2 Height Weight  05/16/19 1946 (!) 154/86 97.8 F (36.6 C) 91 18 96 % 5\' 10"  (1.778 m) 74.8 kg    10:47 PM Reevaluation with update and discussion. After initial assessment and treatment, an updated evaluation reveals patient reports he is feeling better at this time he does demonstrate some less tremor at this time. He feels like he is stable for discharge.  Findings discussed and questions answered. Daleen Bo   Medical Decision Making: Benzodiazepine withdrawal, symptoms can be controlled, with oral medications and he is stable for discharge.  Backslash bacterial infection, metabolic instability or impending vascular collapse.  CRITICAL CARE- No Performed by: Daleen Bo  Nursing Notes Reviewed/ Care Coordinated Applicable Imaging Reviewed Interpretation of Laboratory Data incorporated into ED treatment  The patient appears reasonably screened and/or stabilized for discharge and I doubt any other medical condition or other [facility] requiring further screening, evaluation, or treatment in the ED at this time prior to discharge.  Plan: Home Medications-OTC medication as needed; Home Treatments-continue to avoid Xanax; return here if the recommended treatment, does not improve the symptoms; Recommended follow up-PCP follow-up 1 week and as needed.   Final Clinical Impressions(s) / ED Diagnoses   Final diagnoses:  Benzodiazepine withdrawal without complication [facility])    ED Discharge Orders         Ordered    cloNIDine (CATAPRES) 0.1 MG tablet     05/16/19 2301    hydrOXYzine (ATARAX/VISTARIL) 25 MG tablet  Every 6 hours PRN     05/16/19 2301            Daleen Bo, MD 05/16/19 2303

## 2019-05-16 NOTE — Progress Notes (Signed)
PROCEDURE NOTE:  The patient request injection, verbal consent was obtained.  The right shoulder was prepped appropriately after time out was performed.   Sterile technique was observed and injection of 1 cc of Depo-Medrol 40 mg with several cc's of plain xylocaine. Anesthesia was provided by ethyl chloride and a 20-gauge needle was used to inject the shoulder area. A posterior approach was used.  The injection was tolerated well.  A band aid dressing was applied.  The patient was advised to apply ice later today and tomorrow to the injection sight as needed.  I have reviewed the [place] web site prior to prescribing narcotic medicine for this patient.   Return in one month.  Electronically Signed [name], MD 8/18/202010:47 AM

## 2019-05-16 NOTE — ED Triage Notes (Signed)
Pt states he has been taking xanax and valium for 10 years and stopped "cold [place]" 3-4 days because "he wants to be off of them." States he hasn't been able to eat or drink much since he stopped. Pt states he has hx of tremor, but it is worse since he stopped taking the medications.

## 2019-05-16 NOTE — Discharge Instructions (Addendum)
Continue to avoid taking Xanax.  I sent prescriptions to your pharmacy to help you get through the withdrawal symptoms.  Make sure you are getting plenty of rest, drink a lot of fluids and try to eat 3 regular meals each day.  Return here, if needed, for problems.

## 2019-05-17 ENCOUNTER — Ambulatory Visit (HOSPITAL_COMMUNITY)
Admission: RE | Admit: 2019-05-17 | Discharge: 2019-05-17 | Disposition: A | Discharge status: Home / Self Care | Payer: Medicare Other | Attending: Psychiatry | Admitting: Psychiatry

## 2019-05-17 ENCOUNTER — Encounter (HOSPITAL_COMMUNITY): Payer: Self-pay | Admitting: Behavioral Health

## 2019-05-17 DIAGNOSIS — F329 Major depressive disorder, single episode, unspecified: Secondary | ICD-10-CM | POA: Diagnosis not present

## 2019-05-17 DIAGNOSIS — F1310 Sedative, hypnotic or anxiolytic abuse, uncomplicated: Secondary | ICD-10-CM | POA: Insufficient documentation

## 2019-05-17 DIAGNOSIS — F419 Anxiety disorder, unspecified: Secondary | ICD-10-CM | POA: Insufficient documentation

## 2019-05-17 DIAGNOSIS — R450 Nervousness: Secondary | ICD-10-CM | POA: Insufficient documentation

## 2019-05-17 NOTE — BH Assessment (Signed)
Assessment Note  Peter Campbell is a 63 y.o. male who presented to [facility] as a voluntary walk-in with complaint of sedative withdrawal.  Pt is a widower, and he lives alone in [place].  Pt has not been assessed by TTS before.  Pt is not followed by an outpatient psychiatrist or therapist.  Pt reported that he is experiencing withdrawal symptoms after suddenly stopping Xanax use about four days ago.  Pt reported that he was initially prescribed Xanax at age 12 or 84 (could not recall), and that since his prescription ran out, he has purchased street Xanax.  Pt reported that he has taken 4-5 or more tabs of Xanax per day for many years.  Pt reported that he decided to stop, and so he ended use abruptly.  Pt is now experiencing anxiety and poor sleep (about two hours per night).  Pt denied suicidal ideation, homicidal ideation, hallucination, self-injurious behavior.  Pt requested detox services.  Pt presented to the ED on 05/16/2019 with same complaint and was discharged along with prescription to ease withdrawal.  During assessment, Pt presented as alert and oriented.  He had good eye contact and was cooperative.  Pt was dressed in street clothes, and he appeared appropriately groomed.  Pt was tremulous.  Pt's mood and affect were preoccupied and anxious.  Pt's speech was normal in rate, rhythm, and volume.  Thought processes were within normal range, and thought content was logical and goal-oriented.  There was no evidence of delusion.  Pt's memory and concentration were fair.  Insight, judgment, and impulse control were fair.  Consulted with L. Marcello Moores, FNP, who also met with Pt.  Pt to be discharged, will follow-up with instructions to fill prescription.  Outpatient resources also provided.  Diagnosis: Sedative Withdrawal  Past Medical History:  Past Medical History:  Diagnosis Date  . Asthma   . Chronic back pain   . Collagen vascular disease ([place])   . Colonic mass    History, s/p removal; per  patient.  . Coronary atherosclerosis of native coronary artery    Dense coronary atherosclerosis by chest CT November 2014   . Essential hypertension, benign   . Essential tremor   . History of chicken pox   . History of [place] measles   . Sciatica     Past Surgical History:  Procedure Laterality Date  . ABDOMINAL SURGERY    . CIRCUMCISION    . COLON SURGERY     for mass removal  . COLONOSCOPY WITH PROPOFOL N/A 05/03/2017   Procedure: COLONOSCOPY WITH PROPOFOL;  Surgeon: Daneil Dolin, MD;  [facility];  Service: Endoscopy;  Laterality: N/A;  1015  . POLYPECTOMY  05/03/2017   Procedure: POLYPECTOMY;  Surgeon: Daneil Dolin, MD;  Location: [facility];  Service: Endoscopy;;  descending colon and rectal  . SHOULDER ACROMIOPLASTY Right 06/12/2014   Procedure: RIGHT SHOULDER ACROMIOPLASTY;  Surgeon: [name], MD;  Location: [facility] ORS;  Service: Orthopedics;  Laterality: Right;  . SHOULDER OPEN ROTATOR CUFF REPAIR Right 06/12/2014   Procedure: OPEN REPAIR ROTATOR CUFF RIGHT ;  Surgeon: [name], MD;  Location: [facility] ORS;  Service: Orthopedics;  Laterality: Right;  . TONSILLECTOMY      Family History:  Family History  Problem Relation Age of Onset  . COPD Father   . Cancer Father   . Cancer Mother   . Heart attack Brother   . Epilepsy Sister   . Hypertension Sister   . Colon cancer  Neg Hx     Social History:  reports that he has been smoking cigarettes. He started smoking about 49 years ago. He has a 21.00 pack-year smoking history. He has never used smokeless tobacco. He reports current drug use. Drugs: Marijuana and Other-see comments. He reports that he does not drink alcohol.  Additional Social History:  Alcohol / Drug Use Pain Medications: See MAR Prescriptions: See MAR Over the Counter: See MAR History of alcohol / drug use?: Yes Substance #1 Name of Substance 1: Xanax 1 - Age of First Use: 31 1 - Amount (size/oz): 5+ tabs 1 - Frequency: Daily 1 -  Duration: Ongoing 1 - Last Use / Amount: 05/13/2019 Substance #2 Name of Substance 2: Marijuana  CIWA:   COWS:    Allergies:  Allergies  Allergen Reactions  . Fentanyl     Unknown reaction    Home Medications: (Not in a hospital admission)   OB/GYN Status:  No LMP for male patient.  General Assessment Data Location of Assessment: [facility] Assessment Services TTS Assessment: In system Is this a Tele or Face-to-Face Assessment?: Face-to-Face Is this an Initial Assessment or a Re-assessment for this encounter?: Initial Assessment Patient Accompanied by:: N/A Language Other than English: No Living Arrangements: Other (Comment) What gender do you identify as?: Male Marital status: Widowed Pregnancy Status: No Living Arrangements: Alone Can pt return to current living arrangement?: Yes Admission Status: Voluntary Is patient capable of signing voluntary admission?: Yes Referral Source: Self/Family/Friend Insurance type: [facility]     Crisis Care Plan Living Arrangements: Alone Name of Psychiatrist: None Name of Therapist: None  Education Status Is patient currently in school?: No Is the patient employed, unemployed or receiving disability?: Receiving disability income  Risk to self with the past 6 months Suicidal Ideation: No Has patient been a risk to self within the past 6 months prior to admission? : No Suicidal Intent: No Has patient had any suicidal intent within the past 6 months prior to admission? : No Is patient at risk for suicide?: No Suicidal Plan?: No Has patient had any suicidal plan within the past 6 months prior to admission? : No Access to Means: No What has been your use of drugs/alcohol within the last 12 months?: Xanax; marijuana Previous Attempts/Gestures: No Intentional Self Injurious Behavior: None Family Suicide History: No Recent stressful life event(s): Other (Comment)(Drug withdrawal) Persecutory voices/beliefs?: No Depression: No Depression  Symptoms: Insomnia Substance abuse history and/or treatment for substance abuse?: Yes Suicide prevention information given to non-admitted patients: Not applicable  Risk to Others within the past 6 months Homicidal Ideation: No Does patient have any lifetime risk of violence toward others beyond the six months prior to admission? : No Thoughts of Harm to Others: No Current Homicidal Intent: No Current Homicidal Plan: No Access to Homicidal Means: No History of harm to others?: No Assessment of Violence: None Noted Does patient have access to weapons?: No Criminal Charges Pending?: No Does patient have a court date: No Is patient on probation?: No  Psychosis Hallucinations: None noted Delusions: None noted  Mental Status Report Appearance/Hygiene: Unremarkable, Other (Comment)(street clothes) Eye Contact: Good Motor Activity: Freedom of movement, Unremarkable Speech: Logical/coherent Level of Consciousness: Alert Mood: Preoccupied, Anxious Affect: Appropriate to circumstance Anxiety Level: Moderate Thought Processes: Relevant, Coherent Judgement: Partial Orientation: Person, Place, Time, Situation Obsessive Compulsive Thoughts/Behaviors: None  Cognitive Functioning Concentration: Normal Memory: Recent Intact, Remote Intact Is patient IDD: No Insight: Fair Impulse Control: Fair Appetite: Fair Sleep: Decreased Total Hours of  Sleep: 2 Vegetative Symptoms: None  ADLScreening [employer] Assessment Services) Patient's cognitive ability adequate to safely complete daily activities?: Yes Patient able to express need for assistance with ADLs?: Yes Independently performs ADLs?: Yes (appropriate for developmental age)  Prior Inpatient Therapy Prior Inpatient Therapy: No  Prior Outpatient Therapy Prior Outpatient Therapy: No Does patient have an ACCT team?: No Does patient have Intensive In-House Services?  : No Does patient have [facility] services? : No Does patient have P4CC  services?: No  ADL Screening (condition at time of admission) Patient's cognitive ability adequate to safely complete daily activities?: Yes Is the patient deaf or have difficulty hearing?: No Does the patient have difficulty seeing, even when wearing glasses/contacts?: No Does the patient have difficulty concentrating, remembering, or making decisions?: No Patient able to express need for assistance with ADLs?: Yes Does the patient have difficulty dressing or bathing?: No Independently performs ADLs?: Yes (appropriate for developmental age) Does the patient have difficulty walking or climbing stairs?: No Weakness of Legs: None Weakness of Arms/Hands: None  Home Assistive Devices/Equipment Home Assistive Devices/Equipment: None  Therapy Consults (therapy consults require a physician order) PT Evaluation Needed: No OT Evalulation Needed: No SLP Evaluation Needed: No Abuse/Neglect Assessment (Assessment to be complete while patient is alone) Abuse/Neglect Assessment Can Be Completed: Yes Physical Abuse: Denies Verbal Abuse: Denies Sexual Abuse: Denies Exploitation of patient/patient's resources: Denies Self-Neglect: Denies Values / Beliefs Cultural Requests During Hospitalization: None Spiritual Requests During Hospitalization: None Consults Spiritual Care Consult Needed: No Social Work Consult Needed: No Regulatory affairs officer (For Healthcare) Does Patient Have a Medical Advance Directive?: No          Disposition:  Disposition Initial Assessment Completed for this Encounter: Yes Disposition of Patient: Discharge(Per Arvid Right, FNP, Pt does not meet inpt criteria)  On Site Evaluation by:   Reviewed with Physician:    Laurena Slimmer Stefano Trulson 05/17/2019 11:41 AM

## 2019-05-17 NOTE — H&P (Signed)
Behavioral Health Medical Screening Exam  Peter Campbell is an 63 y.o. male.who presented to [facility] as a walk-in, voluntarily, accompanied alone. He reports he lives alone and is on disability.  He reports he is here to seek detox from Xanax. 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 Reports using at least 3 per day which is  Mostly used for sleep although he does endorse some anxiety. He reports he has not taken any Xanax for the past 3-5 days although he feels as though he is in withdrawal. Reports he went to [facility] ED last night with similar complaints and he was discharged with outpatient resources for substance abuse although he did not follow-up with the resources provided. He denies other substance abuse or use. He denies SI, HI or AVH. He admits to some depression mostly related to his Xanax use. He describes depression as sadness, guilt, decreased, sleep, decreased appetite, and embarrassment. He denies history of mental health illness with inclusions of SA, self-harming history, inpatient hospitalization, or outpatient therapy/psychiatry. He reports no treatment for substance abuse in the past.   Total Time spent with patient: 15 minutes  Psychiatric Specialty Exam: Physical Exam  Nursing note and vitals reviewed. Constitutional: He is oriented to person, place, and time.  Neurological: He is alert and oriented to person, place, and time.    Review of Systems  Psychiatric/Behavioral: Positive for depression and substance abuse. Negative for hallucinations, memory loss and suicidal ideas. The patient is nervous/anxious. The patient does not have insomnia.     There were no vitals taken for this visit.There is no height or weight on file to calculate BMI.  General Appearance: Casual  Eye Contact:  Good  Speech:  Clear and Coherent and Normal Rate  Volume:  Normal  Mood:  Anxious  Affect:  Congruent  Thought Process:  Coherent, Goal Directed, Linear and Descriptions of  Associations: Intact  Orientation:  Full (Time, Place, and Person)  Thought Content:  WDL  Suicidal Thoughts:  No  Homicidal Thoughts:  No  Memory:  Immediate;   Fair Recent;   Fair  Judgement:  Fair  Insight:  Fair  Psychomotor Activity:  Normal  Concentration: Concentration: Fair and Attention Span: Fair  Recall:  [employer] of Knowledge:Fair  Language: Good  Akathisia:  Negative  Handed:  Right  AIMS (if indicated):     Assets:  Communication Skills Desire for Improvement Resilience Social Support  Sleep:       Musculoskeletal: Strength & Muscle Tone: within normal limits Gait & Station: normal Patient leans: N/A  There were no vitals taken for this visit.  Recommendations:  Based on my evaluation the patient does not appear to have an emergency medical condition.   No evidence of imminent risk to self or others at present.   Patient does not meet criteria for psychiatric inpatient admission. Reccommended to contact resources provided for detox and substance abuse treatment. We discussed [facility] as they are known for their substance and detox program however, he stated that was to far. Supports and encouragement provided.      Mordecai Maes, NP 05/17/2019, 11:33 AM

## 2019-05-18 ENCOUNTER — Encounter (HOSPITAL_COMMUNITY): Payer: Self-pay | Admitting: Emergency Medicine

## 2019-05-18 ENCOUNTER — Emergency Department (HOSPITAL_COMMUNITY)
Admission: EM | Admit: 2019-05-18 | Discharge: 2019-05-18 | Disposition: A | Discharge status: Home / Self Care | Payer: Medicare Other | Attending: Emergency Medicine | Admitting: Emergency Medicine

## 2019-05-18 ENCOUNTER — Other Ambulatory Visit: Payer: Self-pay

## 2019-05-18 ENCOUNTER — Emergency Department (HOSPITAL_COMMUNITY): Payer: Medicare Other

## 2019-05-18 DIAGNOSIS — R079 Chest pain, unspecified: Secondary | ICD-10-CM | POA: Diagnosis not present

## 2019-05-18 DIAGNOSIS — Z5321 Procedure and treatment not carried out due to patient leaving prior to being seen by health care provider: Secondary | ICD-10-CM | POA: Insufficient documentation

## 2019-05-18 MED ORDER — SODIUM CHLORIDE 0.9% FLUSH: 3.0000 mL | Freq: Once | INTRAVENOUS | Status: DC

## 2019-05-18 NOTE — ED Triage Notes (Signed)
Pt C/o chest pain and SOB that started 30 minutes ago. Pt has taken 2 nitro with no relief.

## 2019-05-18 NOTE — ED Notes (Signed)
Pt called by nurses and radiology with no response.

## 2019-05-25 ENCOUNTER — Telehealth: Payer: Medicare Other | Admitting: Cardiology

## 2019-05-26 ENCOUNTER — Telehealth (INDEPENDENT_AMBULATORY_CARE_PROVIDER_SITE_OTHER): Payer: Medicare Other | Admitting: Cardiology

## 2019-05-26 ENCOUNTER — Encounter: Payer: Self-pay | Admitting: Cardiology

## 2019-05-26 DIAGNOSIS — I2510 Atherosclerotic heart disease of native coronary artery without angina pectoris: Secondary | ICD-10-CM

## 2019-05-26 DIAGNOSIS — G8929 Other chronic pain: Secondary | ICD-10-CM

## 2019-05-26 DIAGNOSIS — I10 Essential (primary) hypertension: Secondary | ICD-10-CM | POA: Diagnosis not present

## 2019-05-26 DIAGNOSIS — Z720 Tobacco use: Secondary | ICD-10-CM | POA: Diagnosis not present

## 2019-05-26 MED ORDER — METOPROLOL SUCCINATE ER 25 MG PO TB24: 50.0000 mg | ORAL_TABLET | ORAL | 3 refills | Status: DC

## 2019-05-26 NOTE — Patient Instructions (Addendum)
Medication Instructions:   Your physician has recommended you make the following change in your medication:   Increase metoprolol succinate to 50 mg in the morning and 25 mg in the evening  Continue all other medications the same  Labwork:  NONE  Testing/Procedures:  NONE  Follow-Up:  Your physician recommends that you schedule a follow-up appointment in: 6 months at the [place] office. You will receive a reminder letter in the mail in about 4 months reminding you to call and schedule your appointment. If you don't receive this letter, please contact our office.  Any Other Special Instructions Will Be Listed Below (If Applicable).  If you need a refill on your cardiac medications before your next appointment, please call your pharmacy.

## 2019-05-26 NOTE — Progress Notes (Signed)
Virtual Visit via Telephone Note   This visit type was conducted due to national recommendations for restrictions regarding the COVID-19 Pandemic (e.g. social distancing) in an effort to limit this patient's exposure and mitigate transmission in our community.  Due to his co-morbid illnesses, this patient is at least at moderate risk for complications without adequate follow up.  This format is felt to be most appropriate for this patient at this time.  The patient did not have access to video technology/had technical difficulties with video requiring transitioning to audio format only (telephone).  All issues noted in this document were discussed and addressed.  No physical exam could be performed with this format.  Please refer to the patient's chart for his  consent to telehealth for [facility].   Date:  05/26/2019   ID:  Peter Campbell, DOB [birth year], MRN [id number]  Patient Location: Home Provider Location: Office  PCP:  Abran Richard, MD  Cardiologist:  Rozann Lesches, MD Electrophysiologist:  None   Evaluation Performed:  Follow-Up Visit  Chief Complaint:   Cardiac follow-up  History of Present Illness:    Peter Campbell is a 63 y.o. male last seen in January.  We spoke by phone today. He tells me that he has had recent benzodiazepine withdrawal symptoms in association with discontinuing Xanax which he had been abusing.  He was seen in the ER on August 18 and more recently on August 20, I reviewed the notes.  He reports chronic pain which is part of the problem and has had some intermittent angina symptoms during this time.  I personally reviewed his recent ECG which did not show any acute ST segment changes.  His blood pressure has been higher during this time as well.  I reviewed his current medications which are listed below and stable from a cardiac perspective.  We discussed increasing his beta-blocker.  Also talked with him about having his PCP refer him to a pain management  clinic.  The patient does not have symptoms concerning for COVID-19 infection (fever, chills, cough, or new shortness of breath).    Past Medical History:  Diagnosis Date  . Asthma   . Chronic back pain   . Collagen vascular disease ([place])   . Colonic mass    History, s/p removal; per patient.  . Coronary atherosclerosis of native coronary artery    Dense coronary atherosclerosis by chest CT November 2014   . Essential hypertension, benign   . Essential tremor   . History of chicken pox   . History of [place] measles   . Sciatica    Past Surgical History:  Procedure Laterality Date  . ABDOMINAL SURGERY    . CIRCUMCISION    . COLON SURGERY     for mass removal  . COLONOSCOPY WITH PROPOFOL N/A 05/03/2017   Procedure: COLONOSCOPY WITH PROPOFOL;  Surgeon: Daneil Dolin, MD;  [facility];  Service: Endoscopy;  Laterality: N/A;  1015  . POLYPECTOMY  05/03/2017   Procedure: POLYPECTOMY;  Surgeon: Daneil Dolin, MD;  Location: [facility];  Service: Endoscopy;;  descending colon and rectal  . SHOULDER ACROMIOPLASTY Right 06/12/2014   Procedure: RIGHT SHOULDER ACROMIOPLASTY;  Surgeon: [name], MD;  Location: [facility] ORS;  Service: Orthopedics;  Laterality: Right;  . SHOULDER OPEN ROTATOR CUFF REPAIR Right 06/12/2014   Procedure: OPEN REPAIR ROTATOR CUFF RIGHT ;  Surgeon: [name], MD;  Location: [facility] ORS;  Service: Orthopedics;  Laterality: Right;  . TONSILLECTOMY  Current Meds  Medication Sig  . albuterol (PROVENTIL HFA;VENTOLIN HFA) 108 (90 Base) MCG/ACT inhaler Inhale 1-2 puffs into the lungs every 6 (six) hours as needed for wheezing or shortness of breath.  Marland Kitchen amLODipine (NORVASC) 5 MG tablet Take 1 tablet (5 mg total) by mouth daily.  Marland Kitchen aspirin EC 81 MG tablet Take 1 tablet (81 mg total) by mouth daily.  Marland Kitchen gabapentin (NEURONTIN) 600 MG tablet Take 600 mg by mouth 3 (three) times daily.   Marland Kitchen ipratropium-albuterol (DUONEB) 0.5-2.5 (3) MG/3ML SOLN Take 3 mLs by  nebulization every 6 (six) hours as needed. (Patient taking differently: Take 3 mLs by nebulization every 6 (six) hours as needed (shortness of breath). )  . LINZESS 145 MCG CAPS capsule TAKE 1 CAPSULE BY MOUTH ONCE DAILY. TAKE A SECOND CAPSULE BY MOUTH EVERY OTHER DAY. (Patient taking differently: Take 145 mcg by mouth daily before breakfast. )  . metoprolol succinate (TOPROL-XL) 25 MG 24 hr tablet Take 2 tablets (50 mg total) by mouth every morning. & 25 mg by mouth in the evening  . naproxen (NAPROSYN) 500 MG tablet TAKE (1) TABLET TWICE A DAY WITH FOOD---BREAKFAST AND SUPPER. (Patient taking differently: Take 500 mg by mouth 2 (two) times daily with a meal. )  . nitroGLYCERIN (NITROSTAT) 0.4 MG SL tablet Place 1 tablet (0.4 mg total) under the tongue every 5 (five) minutesas needed for chest pain.  Marland Kitchen oxyCODONE-acetaminophen (PERCOCET/ROXICET) 5-325 MG tablet Take 1 tablet by mouth every 6 (six) hours as needed for up to 14 days for severe pain. One tablet every six hours for pain. 14 day limit  . temazepam (RESTORIL) 15 MG capsule Take 45 mg by mouth at bedtime.   . [DISCONTINUED] metoprolol succinate (TOPROL-XL) 25 MG 24 hr tablet Take 1 tablet (25 mg total) by mouth 2 (two) times daily.     Allergies:   Fentanyl   Social History   Tobacco Use  . Smoking status: Current Every Day Smoker    Packs/day: 1.00    Years: 21.00    Pack years: 21.00    Types: Cigarettes    Start date: 08/28/1969  . Smokeless tobacco: Never Used  Substance Use Topics  . Alcohol use: No    Alcohol/week: 0.0 standard drinks  . Drug use: Yes    Types: Marijuana, Other-see comments    Comment: Pt uses Xanax     Family Hx: The patient's family history includes COPD in his father; Cancer in his father and mother; Epilepsy in his sister; Heart attack in his brother; Hypertension in his sister. There is no history of Colon cancer.  ROS:   Please see the history of present illness.    Chronic pain. All other  systems reviewed and are negative.   Prior CV studies:   The following studies were reviewed today:  Lexiscan Myoview 07/08/2017:  The study is normal.  This is a low risk study.  The left ventricular ejection fraction is normal (55-65%).  There was no ST segment deviation noted during stress.  Diaphragmatic attenuation no ischemia. SDS 1. Normal wall motion EF 60%  Labs/Other Tests and Data Reviewed:    EKG:  An ECG dated 05/18/2019 was personally reviewed today and demonstrated:  Normal sinus rhythm with vertical axis.  Recent Labs: 05/16/2019: BUN 21; Creatinine, Ser 0.62; Hemoglobin 15.9; Platelets 270; Potassium 3.8; Sodium 137   Recent Lipid Panel Lab Results  Component Value Date/Time   CHOL 122 07/08/2017 08:26 AM   TRIG 104  07/08/2017 08:26 AM   HDL 33 (L) 07/08/2017 08:26 AM   CHOLHDL 3.7 07/08/2017 08:26 AM   LDLCALC 68 07/08/2017 08:26 AM    Wt Readings from Last 3 Encounters:  05/26/19 165 lb (74.8 kg)  05/18/19 165 lb (74.8 kg)  05/16/19 165 lb (74.8 kg)     Objective:    Vital Signs:  BP 135/86   Pulse 91   Ht 5\' 10"  (1.778 m)   Wt 165 lb (74.8 kg)   BMI 23.68 kg/m    Patient spoke in full sentences, not obviously short of breath. No audible coughing or wheezing.  ASSESSMENT & PLAN:    1.  Multivessel coronary artery calcification by prior chest CT imaging.  He had a low risk Myoview in 2018 as outlined above.  Plan is to continue medical therapy at this time, ECG reviewed.  He is on aspirin, Norvasc, and metoprolol with as needed nitroglycerin.  He declines statin therapy.  2.  Recent benzodiazepine withdrawal with history of Xanax abuse complicated by chronic pain.  This has exacerbated symptoms.  3.  Essential hypertension.  We will increase Toprol-XL to 50 mg in the morning and 25 mg in the evening, continue Norvasc.  May also help with potential angina.  COVID-19 Education: The signs and symptoms of COVID-19 were discussed with the  patient and how to seek care for testing (follow up with PCP or arrange E-visit).  The importance of social distancing was discussed today.  Time:   Today, I have spent 10 minutes with the patient with telehealth technology discussing the above problems.     Medication Adjustments/Labs and Tests Ordered: Current medicines are reviewed at length with the patient today.  Concerns regarding medicines are outlined above.   Tests Ordered: No orders of the defined types were placed in this encounter.   Medication Changes: Meds ordered this encounter  Medications  . metoprolol succinate (TOPROL-XL) 25 MG 24 hr tablet    Sig: Take 2 tablets (50 mg total) by mouth every morning. & 25 mg by mouth in the evening    Dispense:  270 tablet    Refill:  3    05/26/2019 dose increase    Follow Up:  In Person 4 months in the [place] office.  Signed, Nona DellSamuel McDowell, MD  05/26/2019 1:31 PM    [facility]

## 2019-06-13 ENCOUNTER — Ambulatory Visit (INDEPENDENT_AMBULATORY_CARE_PROVIDER_SITE_OTHER): Payer: Medicare Other | Admitting: Orthopaedic Surgery

## 2019-06-13 ENCOUNTER — Other Ambulatory Visit: Payer: Self-pay

## 2019-06-13 ENCOUNTER — Encounter: Payer: Self-pay | Admitting: Orthopaedic Surgery

## 2019-06-13 DIAGNOSIS — F17210 Nicotine dependence, cigarettes, uncomplicated: Secondary | ICD-10-CM | POA: Diagnosis not present

## 2019-06-13 DIAGNOSIS — G8929 Other chronic pain: Secondary | ICD-10-CM

## 2019-06-13 DIAGNOSIS — M25511 Pain in right shoulder: Secondary | ICD-10-CM | POA: Diagnosis not present

## 2019-06-13 MED ORDER — OXYCODONE-ACETAMINOPHEN 7.5-325 MG PO TABS: 1.0000 | ORAL_TABLET | Freq: Four times a day (QID) | ORAL | 0 refills | Status: DC | PRN

## 2019-06-13 NOTE — Progress Notes (Signed)
PROCEDURE NOTE:  The patient request injection, verbal consent was obtained.  The right shoulder was prepped appropriately after time out was performed.   Sterile technique was observed and injection of 1 cc of Depo-Medrol 40 mg with several cc's of plain xylocaine. Anesthesia was provided by ethyl chloride and a 20-gauge needle was used to inject the shoulder area. A posterior approach was used.  The injection was tolerated well.  A band aid dressing was applied.  The patient was advised to apply ice later today and tomorrow to the injection sight as needed.  I have reviewed the [place] web site prior to prescribing narcotic medicine for this patient.   Electronically Signed [name], MD 9/15/20209:52 AM

## 2019-06-13 NOTE — Patient Instructions (Signed)
Steps to Quit Smoking Smoking tobacco is the leading cause of preventable death. It can affect almost every organ in the body. Smoking puts you and people around you at risk for many serious, long-lasting (chronic) diseases. Quitting smoking can be hard, but it is one of the best things that you can do for your health. It is never too late to quit. How do I get ready to quit? When you decide to quit smoking, make a plan to help you succeed. Before you quit:  Pick a date to quit. Set a date within the next 2 weeks to give you time to prepare.  Write down the reasons why you are quitting. Keep this list in places where you will see it often.  Tell your family, friends, and co-workers that you are quitting. Their support is important.  Talk with your doctor about the choices that may help you quit.  Find out if your health insurance will pay for these treatments.  Know the people, places, things, and activities that make you want to smoke (triggers). Avoid them. What first steps can I take to quit smoking?  Throw away all cigarettes at home, at work, and in your car.  Throw away the things that you use when you smoke, such as ashtrays and lighters.  Clean your car. Make sure to empty the ashtray.  Clean your home, including curtains and carpets. What can I do to help me quit smoking? Talk with your doctor about taking medicines and seeing a counselor at the same time. You are more likely to succeed when you do both.  If you are pregnant or breastfeeding, talk with your doctor about counseling or other ways to quit smoking. Do not take medicine to help you quit smoking unless your doctor tells you to do so. To quit smoking: Quit right away  Quit smoking totally, instead of slowly cutting back on how much you smoke over a period of time.  Go to counseling. You are more likely to quit if you go to counseling sessions regularly. Take medicine You may take medicines to help you quit. Some  medicines need a prescription, and some you can buy over-the-counter. Some medicines may contain a drug called nicotine to replace the nicotine in cigarettes. Medicines may:  Help you to stop having the desire to smoke (cravings).  Help to stop the problems that come when you stop smoking (withdrawal symptoms). Your doctor may ask you to use:  Nicotine patches, gum, or lozenges.  Nicotine inhalers or sprays.  Non-nicotine medicine that is taken by mouth. Find resources Find resources and other ways to help you quit smoking and remain smoke-free after you quit. These resources are most helpful when you use them often. They include:  Online chats with a counselor.  Phone quitlines.  Printed self-help materials.  Support groups or group counseling.  Text messaging programs.  Mobile phone apps. Use apps on your mobile phone or tablet that can help you stick to your quit plan. There are many free apps for mobile phones and tablets as well as websites. Examples include Quit Guide from the CDC and smokefree.gov  What things can I do to make it easier to quit?   Talk to your family and friends. Ask them to support and encourage you.  Call a phone quitline (1-800-QUIT-NOW), reach out to support groups, or work with a counselor.  Ask people who smoke to not smoke around you.  Avoid places that make you want to smoke,   such as: ? Bars. ? Parties. ? Smoke-break areas at work.  Spend time with people who do not smoke.  Lower the stress in your life. Stress can make you want to smoke. Try these things to help your stress: ? Getting regular exercise. ? Doing deep-breathing exercises. ? Doing yoga. ? Meditating. ? Doing a body scan. To do this, close your eyes, focus on one area of your body at a time from head to toe. Notice which parts of your body are tense. Try to relax the muscles in those areas. How will I feel when I quit smoking? Day 1 to 3 weeks Within the first 24 hours,  you may start to have some problems that come from quitting tobacco. These problems are very bad 2-3 days after you quit, but they do not often last for more than 2-3 weeks. You may get these symptoms:  Mood swings.  Feeling restless, nervous, angry, or annoyed.  Trouble concentrating.  Dizziness.  Strong desire for high-sugar foods and nicotine.  Weight gain.  Trouble pooping (constipation).  Feeling like you may vomit (nausea).  Coughing or a sore throat.  Changes in how the medicines that you take for other issues work in your body.  Depression.  Trouble sleeping (insomnia). Week 3 and afterward After the first 2-3 weeks of quitting, you may start to notice more positive results, such as:  Better sense of smell and taste.  Less coughing and sore throat.  Slower heart rate.  Lower blood pressure.  Clearer skin.  Better breathing.  Fewer sick days. Quitting smoking can be hard. Do not give up if you fail the first time. Some people need to try a few times before they succeed. Do your best to stick to your quit plan, and talk with your doctor if you have any questions or concerns. Summary  Smoking tobacco is the leading cause of preventable death. Quitting smoking can be hard, but it is one of the best things that you can do for your health.  When you decide to quit smoking, make a plan to help you succeed.  Quit smoking right away, not slowly over a period of time.  When you start quitting, seek help from your doctor, family, or friends. This information is not intended to replace advice given to you by your health care provider. Make sure you discuss any questions you have with your health care provider. Document Released: 07/11/2009 Document Revised: 12/02/2018 Document Reviewed: 12/03/2018 Elsevier Patient Education  2020 Elsevier Inc.  

## 2019-07-10 ENCOUNTER — Telehealth: Payer: Self-pay | Admitting: Internal Medicine

## 2019-07-10 NOTE — Telephone Encounter (Signed)
Pt needs a refill on his Linzess and said it may be too early to fill because he alternates day by taking one one day and two the next day. He uses [facility]. [phone]

## 2019-07-10 NOTE — Telephone Encounter (Signed)
Spoke with pt. He's going to check with his pharmacy since he has a refill on the medication. Pt will call back if needed.

## 2019-07-11 ENCOUNTER — Ambulatory Visit (INDEPENDENT_AMBULATORY_CARE_PROVIDER_SITE_OTHER): Payer: Medicare Other | Admitting: Orthopaedic Surgery

## 2019-07-11 ENCOUNTER — Encounter: Payer: Self-pay | Admitting: Orthopaedic Surgery

## 2019-07-11 ENCOUNTER — Other Ambulatory Visit: Payer: Self-pay

## 2019-07-11 DIAGNOSIS — M25511 Pain in right shoulder: Secondary | ICD-10-CM

## 2019-07-11 DIAGNOSIS — G8929 Other chronic pain: Secondary | ICD-10-CM | POA: Diagnosis not present

## 2019-07-11 DIAGNOSIS — M5410 Radiculopathy, site unspecified: Secondary | ICD-10-CM

## 2019-07-11 DIAGNOSIS — F17210 Nicotine dependence, cigarettes, uncomplicated: Secondary | ICD-10-CM

## 2019-07-11 MED ORDER — OXYCODONE-ACETAMINOPHEN 7.5-325 MG PO TABS: 1.0000 | ORAL_TABLET | Freq: Four times a day (QID) | ORAL | 0 refills | Status: AC | PRN

## 2019-07-11 NOTE — Progress Notes (Signed)
PROCEDURE NOTE:  The patient request injection, verbal consent was obtained.  The right shoulder was prepped appropriately after time out was performed.   Sterile technique was observed and injection of 1 cc of Depo-Medrol 40 mg with several cc's of plain xylocaine. Anesthesia was provided by ethyl chloride and a 20-gauge needle was used to inject the shoulder area. A posterior approach was used.  The injection was tolerated well.  A band aid dressing was applied.  The patient was advised to apply ice later today and tomorrow to the injection sight as needed.  He continues to have increasing pain of his lower back post surgery on his back several months ago.  I will get MRI.  Return in one month.  I have reviewed the [place] web site prior to prescribing narcotic medicine for this patient.    Electronically Signed [name], MD 10/13/202010:53 AM

## 2019-07-11 NOTE — Addendum Note (Signed)
Addended by: Derek Mound A on: 07/11/2019 11:04 AM   Modules accepted: Orders

## 2019-07-17 ENCOUNTER — Other Ambulatory Visit: Payer: Self-pay

## 2019-07-17 ENCOUNTER — Ambulatory Visit (HOSPITAL_COMMUNITY)
Admission: RE | Admit: 2019-07-17 | Discharge: 2019-07-17 | Disposition: A | Discharge status: Home / Self Care | Payer: Medicare Other | Source: Ambulatory Visit | Attending: Orthopaedic Surgery | Admitting: Orthopaedic Surgery

## 2019-07-17 DIAGNOSIS — M5410 Radiculopathy, site unspecified: Secondary | ICD-10-CM | POA: Diagnosis not present

## 2019-07-18 ENCOUNTER — Encounter: Payer: Self-pay | Admitting: Internal Medicine

## 2019-07-18 ENCOUNTER — Telehealth: Payer: Self-pay | Admitting: Orthopaedic Surgery

## 2019-07-18 NOTE — Telephone Encounter (Signed)
Peter Campbell called and stated he went for the MRI of his back yesterday.  He didn't know if you were going to call him with the results or go over them with him on his next appointment on the 17th.  Please let me know

## 2019-07-18 NOTE — Telephone Encounter (Signed)
Called [place] and told him about his MRI results per Dr. Luna Glasgow.

## 2019-08-10 ENCOUNTER — Ambulatory Visit: Payer: Medicare Other | Admitting: Orthopaedic Surgery

## 2019-08-15 ENCOUNTER — Encounter: Payer: Self-pay | Admitting: Orthopaedic Surgery

## 2019-08-15 ENCOUNTER — Ambulatory Visit (INDEPENDENT_AMBULATORY_CARE_PROVIDER_SITE_OTHER): Payer: Medicare Other | Admitting: Orthopaedic Surgery

## 2019-08-15 ENCOUNTER — Other Ambulatory Visit: Payer: Self-pay

## 2019-08-15 VITALS — Temp 97.7°F | Ht 70.0 in | Wt 164.0 lb

## 2019-08-15 DIAGNOSIS — F17210 Nicotine dependence, cigarettes, uncomplicated: Secondary | ICD-10-CM | POA: Diagnosis not present

## 2019-08-15 DIAGNOSIS — G8929 Other chronic pain: Secondary | ICD-10-CM | POA: Diagnosis not present

## 2019-08-15 DIAGNOSIS — M25511 Pain in right shoulder: Secondary | ICD-10-CM | POA: Diagnosis not present

## 2019-08-15 MED ORDER — HYDROCODONE-ACETAMINOPHEN 7.5-325 MG PO TABS: 1.0000 | ORAL_TABLET | Freq: Four times a day (QID) | ORAL | 0 refills | Status: DC | PRN

## 2019-08-15 MED ORDER — OXYCODONE-ACETAMINOPHEN 7.5-325 MG PO TABS: 1.0000 | ORAL_TABLET | Freq: Four times a day (QID) | ORAL | 0 refills | Status: AC | PRN

## 2019-08-15 NOTE — Addendum Note (Signed)
Addended by: Willette Pa on: 08/15/2019 10:18 AM   Modules accepted: Orders

## 2019-08-15 NOTE — Progress Notes (Signed)
PROCEDURE NOTE:  The patient request injection, verbal consent was obtained.  The right shoulder was prepped appropriately after time out was performed.   Sterile technique was observed and injection of 1 cc of Depo-Medrol 40 mg with several cc's of plain xylocaine. Anesthesia was provided by ethyl chloride and a 20-gauge needle was used to inject the shoulder area. A posterior approach was used.  The injection was tolerated well.  A band aid dressing was applied.  The patient was advised to apply ice later today and tomorrow to the injection sight as needed.  I have reviewed the [place] web site prior to prescribing narcotic medicine for this patient.   Electronically Signed [name], MD 11/17/202010:09 AM

## 2019-08-18 ENCOUNTER — Encounter: Payer: Self-pay | Admitting: Internal Medicine

## 2019-08-18 ENCOUNTER — Ambulatory Visit (INDEPENDENT_AMBULATORY_CARE_PROVIDER_SITE_OTHER): Payer: Medicare Other | Admitting: Internal Medicine

## 2019-08-18 ENCOUNTER — Other Ambulatory Visit: Payer: Self-pay | Admitting: Student

## 2019-08-18 ENCOUNTER — Other Ambulatory Visit: Payer: Self-pay

## 2019-08-18 VITALS — BP 133/87 | HR 72 | Temp 96.6°F | Ht 70.0 in | Wt 171.4 lb

## 2019-08-18 DIAGNOSIS — I2510 Atherosclerotic heart disease of native coronary artery without angina pectoris: Secondary | ICD-10-CM | POA: Diagnosis not present

## 2019-08-18 DIAGNOSIS — K5909 Other constipation: Secondary | ICD-10-CM

## 2019-08-18 DIAGNOSIS — Z86010 Personal history of colonic polyps: Secondary | ICD-10-CM | POA: Diagnosis not present

## 2019-08-18 DIAGNOSIS — M5416 Radiculopathy, lumbar region: Secondary | ICD-10-CM

## 2019-08-18 DIAGNOSIS — M546 Pain in thoracic spine: Secondary | ICD-10-CM

## 2019-08-18 MED ORDER — LINACLOTIDE 290 MCG PO CAPS: 290.0000 ug | ORAL_CAPSULE | Freq: Every day | ORAL | 3 refills | Status: DC

## 2019-08-18 NOTE — Progress Notes (Signed)
Primary Care Physician:  Alvina Filbert, MD Primary Gastroenterologist:  Dr. Jena Gauss  Pre-Procedure History & Physical: HPI:  Peter Campbell is a 63 y.o. male here for follow-up of opioid-induced constipation.  Peter Campbell is doing well on Linzess.  He found the need to increase to 290 dail;  he has a bowel movement every day  -  abdominal pain has resolved.  Takes chronic oxycodone for musculoskeletal issues.  No bleeding.  Colonoscopy 2 years ago demonstrated adenomas and diverticulosis; due for surveillance colonoscopy 2023.  Past Medical History:  Diagnosis Date  . Asthma   . Chronic back pain   . Collagen vascular disease (HCC)   . Colonic mass    History, s/p removal; per patient.  . Coronary atherosclerosis of native coronary artery    Dense coronary atherosclerosis by chest CT November 2014   . Essential hypertension, benign   . Essential tremor   . History of chicken pox   . History of [place] measles   . Sciatica     Past Surgical History:  Procedure Laterality Date  . ABDOMINAL SURGERY    . CIRCUMCISION    . COLON SURGERY     for mass removal  . COLONOSCOPY WITH PROPOFOL N/A 05/03/2017   Procedure: COLONOSCOPY WITH PROPOFOL;  Surgeon: Corbin Ade, MD;  Location: [facility];  Service: Endoscopy;  Laterality: N/A;  1015  . POLYPECTOMY  05/03/2017   Procedure: POLYPECTOMY;  Surgeon: Corbin Ade, MD;  Location: [facility];  Service: Endoscopy;;  descending colon and rectal  . SHOULDER ACROMIOPLASTY Right 06/12/2014   Procedure: RIGHT SHOULDER ACROMIOPLASTY;  Surgeon: Darreld Mclean, MD;  Location: [facility];  Service: Orthopedics;  Laterality: Right;  . SHOULDER OPEN ROTATOR CUFF REPAIR Right 06/12/2014   Procedure: OPEN REPAIR ROTATOR CUFF RIGHT ;  Surgeon: Darreld Mclean, MD;  Location: [facility];  Service: Orthopedics;  Laterality: Right;  . TONSILLECTOMY      Prior to Admission medications   Medication Sig Start Date End Date Taking? Authorizing Provider  albuterol  (PROVENTIL HFA;VENTOLIN HFA) 108 (90 Base) MCG/ACT inhaler Inhale 1-2 puffs into the lungs every 6 (six) hours as needed for wheezing or shortness of breath.   Yes [provider]  amLODipine (NORVASC) 5 MG tablet Take 1 tablet (5 mg total) by mouth daily. 10/19/18  Yes Jonelle Sidle, MD  aspirin EC 81 MG tablet Take 1 tablet (81 mg total) by mouth daily. 10/31/13  Yes Jonelle Sidle, MD  gabapentin (NEURONTIN) 600 MG tablet Take 600 mg by mouth 3 (three) times daily.  01/26/18  Yes [provider]  ipratropium-albuterol (DUONEB) 0.5-2.5 (3) MG/3ML SOLN Take 3 mLs by nebulization every 6 (six) hours as needed. Patient taking differently: Take 3 mLs by nebulization every 6 (six) hours as needed (shortness of breath).  06/19/15  Yes Delo, Riley Lam, MD  LINZESS 145 MCG CAPS capsule TAKE 1 CAPSULE BY MOUTH ONCE DAILY. TAKE A SECOND CAPSULE BY MOUTH EVERY OTHER DAY. Patient taking differently: Take 145 mcg by mouth daily before breakfast. Some days takes 2 capsules 05/04/19  Yes Anice Paganini, NP  metoprolol succinate (TOPROL-XL) 25 MG 24 hr tablet Take 2 tablets (50 mg total) by mouth every morning. & 25 mg by mouth in the evening 05/26/19  Yes Jonelle Sidle, MD  naproxen (NAPROSYN) 500 MG tablet TAKE (1) TABLET TWICE A DAY WITH FOOD---BREAKFAST AND SUPPER. Patient taking differently: Take 500 mg by mouth 2 (two)  times daily with a meal.  12/13/18  Yes Darreld McleanKeeling, Wayne, MD  nitroGLYCERIN (NITROSTAT) 0.4 MG SL tablet Place 1 tablet (0.4 mg total) under the tongue every 5 (five) minutesas needed for chest pain. 10/19/18  Yes Jonelle SidleMcDowell, Samuel G, MD  oxyCODONE-acetaminophen (PERCOCET) 7.5-325 MG tablet Take 1 tablet by mouth every 6 (six) hours as needed for moderate pain or severe pain (Must last 30 days.). 08/15/19 09/14/19 Yes Darreld McleanKeeling, Wayne, MD  temazepam (RESTORIL) 15 MG capsule Take 45 mg by mouth at bedtime.    Yes [provider]  linaclotide Karlene Einstein(LINZESS) 290 MCG CAPS capsule  Take 1 capsule (290 mcg total) by mouth daily before breakfast. 08/18/19   Cliffie Gingras, Gerrit Friendsobert M, MD    Allergies as of 08/18/2019 - Review Complete 08/18/2019  Allergen Reaction Noted  . Fentanyl  05/16/2019    Family History  Problem Relation Age of Onset  . COPD Father   . Cancer Father   . Cancer Mother   . Heart attack Brother   . Epilepsy Sister   . Hypertension Sister   . Colon cancer Neg Hx     Social History   Socioeconomic History  . Marital status: Widowed    Spouse name: Not on file  . Number of children: Not on file  . Years of education: Not on file  . Highest education level: Not on file  Occupational History  . Occupation: Disabled  Social Needs  . Financial resource strain: Not on file  . Food insecurity    Worry: Not on file    Inability: Not on file  . Transportation needs    Medical: Not on file    Non-medical: Not on file  Tobacco Use  . Smoking status: Current Every Day Smoker    Packs/day: 1.00    Years: 21.00    Pack years: 21.00    Types: Cigarettes    Start date: 08/28/1969  . Smokeless tobacco: Never Used  Substance and Sexual Activity  . Alcohol use: No    Alcohol/week: 0.0 standard drinks  . Drug use: Yes    Types: Marijuana, Other-see comments    Comment: Pt uses Xanax  . Sexual activity: Yes    Partners: Female  Lifestyle  . Physical activity    Days per week: Not on file    Minutes per session: Not on file  . Stress: Not on file  Relationships  . Social Musicianconnections    Talks on phone: Not on file    Gets together: Not on file    Attends religious service: Not on file    Active member of club or organization: Not on file    Attends meetings of clubs or organizations: Not on file    Relationship status: Not on file  . Intimate partner violence    Fear of current or ex partner: Not on file    Emotionally abused: Not on file    Physically abused: Not on file    Forced sexual activity: Not on file  Other Topics Concern  . Not  on file  Social History Narrative   [place] in [place]; on disability    Review of Systems: See HPI, otherwise negative ROS  Physical Exam: BP 133/87   Pulse 72   Temp (!) 96.6 F (35.9 C) (Temporal)   Ht 5\' 10"  (1.778 m)   Wt 171 lb 6.4 oz (77.7 kg)   BMI 24.59 kg/m  General:   Alert,  Well-developed, well-nourished, pleasant and  cooperative in NAD Abdomen: Non-distended, normal bowel sounds.  Soft and nontender without appreciable mass or hepatosplenomegaly.  Impression/Plan: 63 year old gentleman with chronic opioid therapy and secondary constipation.  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  No alarm symptoms.  History colonic adenoma and diverticulosis.  Recommendations:  Continue Linzess 290 capsule once daily (disp 90 -  With 3 refills)  Repeat colonoscopy in 3 years  Office visit in 1 year    Notice: This dictation was prepared with Dragon dictation along with smaller phrase technology. Any transcriptional errors that result from this process are unintentional and may not be corrected upon review.

## 2019-08-18 NOTE — Patient Instructions (Signed)
Continue Linzess 290 capsule once daily (disp 90 -  With 3 refills)  Repeat colonoscopy in 3 years  Office visit in 1 year

## 2019-08-25 ENCOUNTER — Ambulatory Visit (HOSPITAL_COMMUNITY)
Admission: RE | Admit: 2019-08-25 | Discharge: 2019-08-25 | Disposition: A | Discharge status: Home / Self Care | Payer: Medicare Other | Source: Ambulatory Visit | Attending: Student | Admitting: Student

## 2019-08-25 ENCOUNTER — Other Ambulatory Visit: Payer: Self-pay

## 2019-08-25 DIAGNOSIS — M546 Pain in thoracic spine: Secondary | ICD-10-CM | POA: Diagnosis present

## 2019-08-25 DIAGNOSIS — M5416 Radiculopathy, lumbar region: Secondary | ICD-10-CM | POA: Diagnosis present

## 2019-08-25 LAB — POCT I-STAT CREATININE: Creatinine, Ser: 0.9 mg/dL (ref 0.61–1.24)

## 2019-08-25 MED ORDER — GADOBUTROL 1 MMOL/ML IV SOLN
8.0000 mL | Freq: Once | INTRAVENOUS | Status: AC | PRN
  Administered 2019-08-25: 8 mL via INTRAVENOUS

## 2019-09-11 ENCOUNTER — Telehealth: Payer: Self-pay | Admitting: Cardiology

## 2019-09-11 MED ORDER — METOPROLOL SUCCINATE ER 25 MG PO TB24: 50.0000 mg | ORAL_TABLET | ORAL | 3 refills | Status: DC

## 2019-09-11 NOTE — Telephone Encounter (Signed)
Refilled for patient.

## 2019-09-15 ENCOUNTER — Encounter: Payer: Self-pay | Admitting: Cardiology

## 2019-09-15 ENCOUNTER — Ambulatory Visit (INDEPENDENT_AMBULATORY_CARE_PROVIDER_SITE_OTHER): Payer: Medicare Other | Admitting: Cardiology

## 2019-09-15 ENCOUNTER — Other Ambulatory Visit: Payer: Self-pay

## 2019-09-15 VITALS — BP 105/66 | HR 72 | Temp 97.5°F | Ht 70.0 in | Wt 173.0 lb

## 2019-09-15 DIAGNOSIS — I2510 Atherosclerotic heart disease of native coronary artery without angina pectoris: Secondary | ICD-10-CM

## 2019-09-15 DIAGNOSIS — I10 Essential (primary) hypertension: Secondary | ICD-10-CM

## 2019-09-15 DIAGNOSIS — Z720 Tobacco use: Secondary | ICD-10-CM

## 2019-09-15 NOTE — Patient Instructions (Signed)
Medication Instructions:  Your physician recommends that you continue on your current medications as directed. Please refer to the Current Medication list given to you today.  *If you need a refill on your cardiac medications before your next appointment, please call your pharmacy*  Lab Work: None today If you have labs (blood work) drawn today and your tests are completely normal, you will receive your results only by: . MyChart Message (if you have MyChart) OR . A paper copy in the mail If you have any lab test that is abnormal or we need to change your treatment, we will call you to review the results.  Testing/Procedures: None today  Follow-Up: At [facility], you and your health needs are our priority.  As part of our continuing mission to provide you with exceptional heart care, we have created designated Provider Care Teams.  These Care Teams include your primary Cardiologist (physician) and Advanced Practice Providers (APPs -  Physician Assistants and Nurse Practitioners) who all work together to provide you with the care you need, when you need it.  Your next appointment:   6 months  The format for your next appointment:   In Person  Provider:   Samuel McDowell, MD  Other Instructions None     Thank you for choosing [facility] !         

## 2019-09-15 NOTE — Progress Notes (Signed)
Cardiology Office Note  Date: 09/15/2019   ID: Peter Campbell, DOB 08/08/1956, MRN [id number]  PCP:  Alvina FilbertHunter, Denise, MD  Cardiologist:  Nona DellSamuel Kalep Full, MD Electrophysiologist:  None   Chief Complaint  Patient presents with  . Cardiac follow-up    History of Present Illness: Peter Campbell is a 63 y.o. male last assessed via telehealth encounter in August.  He presents today for a follow-up visit, states that he is doing much better.  He is following with a neurosurgeon for treatment of spinal stenosis.  He is no longer on Xanax.  On current medical therapy reports no active angina symptoms and his blood pressure is much better.  He states that he has been back out playing music again in a socially distant environment.  States that he wears a mask when he goes out.  Current cardiac regimen includes aspirin, Norvasc, Toprol-XL, and as needed nitroglycerin.  He has declined statin therapy.  Past Medical History:  Diagnosis Date  . Asthma   . Chronic back pain   . Collagen vascular disease (HCC)   . Colonic mass    History, s/p removal; per patient.  . Coronary atherosclerosis of native coronary artery    Dense coronary atherosclerosis by chest CT November 2014   . Essential hypertension   . Essential tremor   . History of chicken pox   . History of [place] measles   . Sciatica     Past Surgical History:  Procedure Laterality Date  . ABDOMINAL SURGERY    . CIRCUMCISION    . COLON SURGERY     for mass removal  . COLONOSCOPY WITH PROPOFOL N/A 05/03/2017   Procedure: COLONOSCOPY WITH PROPOFOL;  Surgeon: Corbin Adeourk, Robert M, MD;  [facility];  Service: Endoscopy;  Laterality: N/A;  1015  . POLYPECTOMY  05/03/2017   Procedure: POLYPECTOMY;  Surgeon: Corbin Adeourk, Robert M, MD;  Location: [facility];  Service: Endoscopy;;  descending colon and rectal  . SHOULDER ACROMIOPLASTY Right 06/12/2014   Procedure: RIGHT SHOULDER ACROMIOPLASTY;  Surgeon: Darreld McleanWayne Keeling, MD;  Location: [facility];  Service: Orthopedics;  Laterality: Right;  . SHOULDER OPEN ROTATOR CUFF REPAIR Right 06/12/2014   Procedure: OPEN REPAIR ROTATOR CUFF RIGHT ;  Surgeon: Darreld McleanWayne Keeling, MD;  Location: [facility];  Service: Orthopedics;  Laterality: Right;  . TONSILLECTOMY      Current Outpatient Medications  Medication Sig Dispense Refill  . albuterol (PROVENTIL HFA;VENTOLIN HFA) 108 (90 Base) MCG/ACT inhaler Inhale 1-2 puffs into the lungs every 6 (six) hours as needed for wheezing or shortness of breath.    Marland Kitchen. amLODipine (NORVASC) 5 MG tablet Take 1 tablet (5 mg total) by mouth daily. 90 tablet 3  . aspirin EC 81 MG tablet Take 1 tablet (81 mg total) by mouth daily. 90 tablet 3  . gabapentin (NEURONTIN) 600 MG tablet Take 600 mg by mouth 3 (three) times daily.     Marland Kitchen. ipratropium-albuterol (DUONEB) 0.5-2.5 (3) MG/3ML SOLN Take 3 mLs by nebulization every 6 (six) hours as needed. (Patient taking differently: Take 3 mLs by nebulization every 6 (six) hours as needed (shortness of breath). ) 360 mL 0  . linaclotide (LINZESS) 290 MCG CAPS capsule Take 1 capsule (290 mcg total) by mouth daily before breakfast. 90 capsule 3  . LINZESS 145 MCG CAPS capsule TAKE 1 CAPSULE BY MOUTH ONCE DAILY. TAKE A SECOND CAPSULE BY MOUTH EVERY OTHER DAY. (Patient taking differently: Take 145 mcg by mouth daily before  breakfast. Some days takes 2 capsules) 120 capsule 1  . metoprolol succinate (TOPROL-XL) 25 MG 24 hr tablet Take 2 tablets (50 mg total) by mouth every morning. & 25 mg by mouth in the evening 270 tablet 3  . naproxen (NAPROSYN) 500 MG tablet TAKE (1) TABLET TWICE A DAY WITH FOOD---BREAKFAST AND SUPPER. (Patient taking differently: Take 500 mg by mouth 2 (two) times daily with a meal. ) 180 tablet 5  . nitroGLYCERIN (NITROSTAT) 0.4 MG SL tablet Place 1 tablet (0.4 mg total) under the tongue every 5 (five) minutesas needed for chest pain. 25 tablet 3  . oxyCODONE-acetaminophen (PERCOCET) 7.5-325 MG tablet Take 1 tablet by mouth  every 4 (four) hours as needed for severe pain.    Marland Kitchen temazepam (RESTORIL) 15 MG capsule Take 45 mg by mouth at bedtime.      No current facility-administered medications for this visit.   Allergies:  Fentanyl   Social History: The patient  reports that he has been smoking cigarettes. He started smoking about 50 years ago. He has a 21.00 pack-year smoking history. He has never used smokeless tobacco. He reports current drug use. Drugs: Marijuana and Other-see comments. He reports that he does not drink alcohol.   ROS:  Please see the history of present illness. Otherwise, complete review of systems is positive for chronic back pain.  All other systems are reviewed and negative.   Physical Exam: VS:  BP 105/66   Pulse 72   Temp (!) 97.5 F (36.4 C)   Ht 5\' 10"  (1.778 m)   Wt 173 lb (78.5 kg)   SpO2 93%   BMI 24.82 kg/m , BMI Body mass index is 24.82 kg/m.  Wt Readings from Last 3 Encounters:  09/15/19 173 lb (78.5 kg)  08/18/19 171 lb 6.4 oz (77.7 kg)  08/15/19 164 lb (74.4 kg)    General: Patient appears comfortable at rest. HEENT: Conjunctiva and lids normal, wearing a mask. Neck: Supple, no elevated JVP or carotid bruits, no thyromegaly. Lungs: Clear to auscultation, nonlabored breathing at rest. Cardiac: Regular rate and rhythm, no S3 or significant systolic murmur. Abdomen: Soft, nontender, bowel sounds present. Extremities: No pitting edema, distal pulses 2+. Skin: Warm and dry. Musculoskeletal: No kyphosis. Neuropsychiatric: Alert and oriented x3, affect grossly appropriate.  ECG:  An ECG dated 05/18/2019 was personally reviewed today and demonstrated:  Normal sinus rhythm with vertical axis.  Recent Labwork: 05/16/2019: BUN 21; Hemoglobin 15.9; Platelets 270; Potassium 3.8; Sodium 137 08/25/2019: Creatinine, Ser 0.90     Component Value Date/Time   CHOL 122 07/08/2017 0826   TRIG 104 07/08/2017 0826   HDL 33 (L) 07/08/2017 0826   CHOLHDL 3.7 07/08/2017 0826    VLDL 21 07/08/2017 0826   LDLCALC 68 07/08/2017 0826    Other Studies Reviewed Today:  09/07/2017 Myoview 07/08/2017:  The study is normal.  This is a low risk study.  The left ventricular ejection fraction is normal (55-65%).  There was no ST segment deviation noted during stress.  Diaphragmatic attenuation no ischemia. SDS 1. Normal wall motion EF 60%  Assessment and Plan:  1.  Multivessel coronary artery calcification by chest CT imaging.  We will continue medical therapy and he does not report any active angina at this time.  Current regimen includes aspirin, Norvasc, Toprol-XL, and as needed nitroglycerin.  He declined statin therapy.  2.  Essential hypertension, blood pressure is well controlled today.  No changes in current regimen.  3.  Tobacco abuse.  Smoking cessation discussed over time.  Medication Adjustments/Labs and Tests Ordered: Current medicines are reviewed at length with the patient today.  Concerns regarding medicines are outlined above.   Tests Ordered: No orders of the defined types were placed in this encounter.   Medication Changes: No orders of the defined types were placed in this encounter.   Disposition:  Follow up 6 months in the [place] office.  Signed, Satira Sark, MD, [facility] 09/15/2019 2:14 PM    [facility] [address] [place], [place] [address] Phone: [phone]; Fax: [phone]

## 2019-09-19 ENCOUNTER — Ambulatory Visit (INDEPENDENT_AMBULATORY_CARE_PROVIDER_SITE_OTHER): Payer: Medicare Other | Admitting: Orthopaedic Surgery

## 2019-09-19 ENCOUNTER — Encounter: Payer: Self-pay | Admitting: Orthopaedic Surgery

## 2019-09-19 ENCOUNTER — Other Ambulatory Visit: Payer: Self-pay

## 2019-09-19 VITALS — BP 141/79 | HR 78 | Temp 97.3°F | Ht 70.0 in | Wt 171.2 lb

## 2019-09-19 DIAGNOSIS — F17210 Nicotine dependence, cigarettes, uncomplicated: Secondary | ICD-10-CM

## 2019-09-19 DIAGNOSIS — M25511 Pain in right shoulder: Secondary | ICD-10-CM | POA: Diagnosis not present

## 2019-09-19 DIAGNOSIS — G8929 Other chronic pain: Secondary | ICD-10-CM

## 2019-09-19 MED ORDER — OXYCODONE-ACETAMINOPHEN 7.5-325 MG PO TABS: 1.0000 | ORAL_TABLET | ORAL | 0 refills | Status: DC | PRN

## 2019-09-19 MED ORDER — TIZANIDINE HCL 4 MG PO TABS: ORAL_TABLET | ORAL | 1 refills | Status: DC

## 2019-09-19 NOTE — Patient Instructions (Signed)

## 2019-09-19 NOTE — Progress Notes (Signed)
PROCEDURE NOTE:  The patient request injection, verbal consent was obtained.  The right shoulder was prepped appropriately after time out was performed.   Sterile technique was observed and injection of 1 cc of Depo-Medrol 40 mg with several cc's of plain xylocaine. Anesthesia was provided by ethyl chloride and a 20-gauge needle was used to inject the shoulder area. A posterior approach was used.  The injection was tolerated well.  A band aid dressing was applied.  The patient was advised to apply ice later today and tomorrow to the injection sight as needed.  Return in one month.  I will give Zanaflex for his back spasms he has at times.  Pain medicine refilled.  I have reviewed the [place] web site prior to prescribing narcotic medicine for this patient.   Electronically Signed [name], MD 12/22/202010:56 AM

## 2019-10-13 ENCOUNTER — Other Ambulatory Visit: Payer: Self-pay | Admitting: Cardiology

## 2019-10-17 ENCOUNTER — Encounter: Payer: Self-pay | Admitting: Orthopaedic Surgery

## 2019-10-17 ENCOUNTER — Ambulatory Visit (INDEPENDENT_AMBULATORY_CARE_PROVIDER_SITE_OTHER): Payer: Medicare Other | Admitting: Orthopaedic Surgery

## 2019-10-17 ENCOUNTER — Other Ambulatory Visit: Payer: Self-pay

## 2019-10-17 DIAGNOSIS — F17210 Nicotine dependence, cigarettes, uncomplicated: Secondary | ICD-10-CM

## 2019-10-17 DIAGNOSIS — G8929 Other chronic pain: Secondary | ICD-10-CM

## 2019-10-17 DIAGNOSIS — M25511 Pain in right shoulder: Secondary | ICD-10-CM

## 2019-10-17 MED ORDER — OXYCODONE-ACETAMINOPHEN 7.5-325 MG PO TABS: 1.0000 | ORAL_TABLET | ORAL | 0 refills | Status: DC | PRN

## 2019-10-17 NOTE — Patient Instructions (Signed)

## 2019-10-17 NOTE — Progress Notes (Signed)
PROCEDURE NOTE:  The patient request injection, verbal consent was obtained.  The right shoulder was prepped appropriately after time out was performed.   Sterile technique was observed and injection of 1 cc of Depo-Medrol 40 mg with several cc's of plain xylocaine. Anesthesia was provided by ethyl chloride and a 20-gauge needle was used to inject the shoulder area. A posterior approach was used.  The injection was tolerated well.  A band aid dressing was applied.  The patient was advised to apply ice later today and tomorrow to the injection sight as needed.  I have reviewed the [place] Controlled Substance Reporting System web site prior to prescribing narcotic medicine for this patient.  Return in one month.  Electronically Signed Darreld Mclean, MD 1/19/20212:47 PM

## 2019-10-20 ENCOUNTER — Other Ambulatory Visit: Payer: Self-pay | Admitting: Orthopaedic Surgery

## 2019-11-14 ENCOUNTER — Other Ambulatory Visit: Payer: Self-pay | Admitting: Neurosurgery

## 2019-11-14 ENCOUNTER — Encounter: Payer: Self-pay | Admitting: Orthopaedic Surgery

## 2019-11-14 ENCOUNTER — Ambulatory Visit (INDEPENDENT_AMBULATORY_CARE_PROVIDER_SITE_OTHER): Payer: Medicare Other | Admitting: Orthopaedic Surgery

## 2019-11-14 ENCOUNTER — Other Ambulatory Visit: Payer: Self-pay

## 2019-11-14 DIAGNOSIS — G8929 Other chronic pain: Secondary | ICD-10-CM

## 2019-11-14 DIAGNOSIS — M25511 Pain in right shoulder: Secondary | ICD-10-CM | POA: Diagnosis not present

## 2019-11-14 DIAGNOSIS — F17210 Nicotine dependence, cigarettes, uncomplicated: Secondary | ICD-10-CM

## 2019-11-14 MED ORDER — OXYCODONE-ACETAMINOPHEN 7.5-325 MG PO TABS: 1.0000 | ORAL_TABLET | ORAL | 0 refills | Status: DC | PRN

## 2019-11-14 NOTE — Progress Notes (Signed)
PROCEDURE NOTE:  The patient request injection, verbal consent was obtained.  The right shoulder was prepped appropriately after time out was performed.   Sterile technique was observed and injection of 1 cc of Depo-Medrol 40 mg with several cc's of plain xylocaine. Anesthesia was provided by ethyl chloride and a 20-gauge needle was used to inject the shoulder area. A posterior approach was used.  The injection was tolerated well.  A band aid dressing was applied.  The patient was advised to apply ice later today and tomorrow to the injection sight as needed.  I have reviewed the [place] Controlled Substance Reporting System web site prior to prescribing narcotic medicine for this patient.   Return in one month.  Call if any problem.  Precautions discussed.   Electronically Signed Darreld Mclean, MD 2/16/20212:35 PM

## 2019-11-14 NOTE — Patient Instructions (Signed)

## 2019-11-21 ENCOUNTER — Telehealth: Payer: Self-pay | Admitting: Orthopaedic Surgery

## 2019-11-21 MED ORDER — TIZANIDINE HCL 4 MG PO TABS: ORAL_TABLET | ORAL | 0 refills | Status: DC

## 2019-11-21 NOTE — Telephone Encounter (Signed)
Patient requests refill on Tizanidine 4 mgs.  Qty  30  Sig: TAKE 1 TABLET BEFORE BEDTIME AS NEEDED FOR MUSCLE SPASM  Patient taking differently: Take 4 mg by mouth at bedtime.     Patient uses [employer]

## 2019-11-21 NOTE — Telephone Encounter (Signed)
Peter Campbell states he is having back surgery on November 27, 2019.  He is scheduled here to have shoulder injection on Tuesday, 12/12/19.  He wants to know if he will be able to have the injections on the 16th or does he need to reschedule this appointment.    Please advise

## 2019-11-21 NOTE — Telephone Encounter (Signed)
Reschedule to end of month or first of the following month.

## 2019-11-23 NOTE — Pre-Procedure Instructions (Signed)
CHRITOPHER COSTER  11/23/2019     Your procedure is scheduled on Monday, March 1..  Report to [facility], Main Entrance or Entrance "A" at [facility] AM                             Your surgery or procedure is scheduled for 12:15 PM    Call this number if you have problems the morning of surgery:[phone]  This is the number for the Pre- Surgical Desk.                     Remember:  Do not eat or drink after midnight Sunday, February 28.    Take these medicines the morning of surgery with A SIP OF WATER:              amLODipine (NORVASC)             gabapentin (NEURONTIN)              metoprolol succinate (TOPROL-XL)  Take if needed: oxyCODONE-acetaminophen (PERCOCET) nitroGLYCERIN (NITROSTAT) Use if needed:   ipratropium-albuterol (DUONEB)  albuterol (PROVENTIL HFA;VENTOLIN HFA) - bring inhaler to the hospital with you.  STOP taking , Aspirin Products (Goody Powder, Excedrin Migraine), Ibuprofen (Advil), Naproxen (Aleve), Vitamins and Herbal Products (ie Fish Oil).   Follow your surgeon's instructions regarding Aspirin.  >>>>>>>>>>Do not smoke within 24 hours of surgery.<<<<<<<<<<<    [facility]- Preparing For Surgery  Before surgery, you can play an important role. Because skin is not sterile, your skin needs to be as free of germs as possible. You can reduce the number of germs on your skin by washing with CHG (chlorahexidine gluconate) Soap before surgery.  CHG is an antiseptic cleaner which kills germs and bonds with the skin to continue killing germs even after washing.    Oral Hygiene is also important to reduce your risk of infection.  Remember - BRUSH YOUR TEETH THE MORNING OF SURGERY WITH YOUR REGULAR TOOTHPASTE  Please do not use if you have an allergy to CHG or antibacterial soaps. If your skin becomes reddened/irritated stop using the CHG.  Do not shave (including legs and underarms) for at least 48 hours prior to first CHG shower. It is OK to shave your  face.  Please follow these instructions carefully.   1. Shower the NIGHT BEFORE SURGERY and the MORNING OF SURGERY with CHG.   2. If you chose to wash your hair, wash your hair first as usual with your normal shampoo.  3. After you shampoo, rinse your hair and body thoroughly to remove the shampoo.  4. Use CHG as you would any other liquid soap. You can apply CHG directly to the skin and wash gently with a scrungie or a clean washcloth.   5. Apply the CHG Soap to your body ONLY FROM THE NECK DOWN.  Do not use on open wounds or open sores. Avoid contact with your eyes, ears, mouth and genitals (private parts). Wash Face and genitals (private parts)  with your normal soap.  6. Wash thoroughly, paying special attention to the area where your surgery will be performed.  7. Thoroughly rinse your body with warm water from the neck down.  8. DO NOT shower/wash with your normal soap after using and rinsing off the CHG Soap.  9. Pat yourself dry with a CLEAN TOWEL.  10. Wear CLEAN PAJAMAS to bed the night  before surgery, wear comfortable clothes the morning of surgery  11. Place CLEAN SHEETS on your bed the night of your first shower and DO NOT SLEEP WITH PETS.    Day of Surgery: Shower as instructed above. Do not apply any deodorants/lotions, powders or colognes.  Please wear clean clothes to the hospital/surgery center.   Remember to brush your teeth WITH YOUR REGULAR TOOTHPASTE  Do not wear jewelry, make-up or nail polish             Men may shave face and neck.  Do not bring valuables to the hospital.  [facility] is not responsible for any belongings or valuables.  Contacts, dentures or bridgework may not be worn into surgery.  Leave your suitcase in the car.  After surgery it may be brought to your room.  For patients admitted to the hospital, discharge time will be determined by your treatment team.  Patients discharged the day of surgery will not be allowed to drive home.    Please read over the following fact sheets that you were given: Coughing and Deep Breathing, Pain Booklet and Surgical Site Infections

## 2019-11-24 ENCOUNTER — Other Ambulatory Visit: Payer: Self-pay

## 2019-11-24 ENCOUNTER — Encounter (HOSPITAL_COMMUNITY)
Admission: RE | Admit: 2019-11-24 | Discharge: 2019-11-24 | Disposition: A | Discharge status: Home / Self Care | Payer: Medicare Other | Source: Ambulatory Visit | Attending: Neurosurgery | Admitting: Neurosurgery

## 2019-11-24 ENCOUNTER — Other Ambulatory Visit (HOSPITAL_COMMUNITY)
Admission: RE | Admit: 2019-11-24 | Discharge: 2019-11-24 | Disposition: A | Discharge status: Home / Self Care | Payer: Medicare Other | Source: Ambulatory Visit | Attending: Neurosurgery | Admitting: Neurosurgery

## 2019-11-24 ENCOUNTER — Encounter (HOSPITAL_COMMUNITY): Payer: Self-pay

## 2019-11-24 DIAGNOSIS — Z20822 Contact with and (suspected) exposure to covid-19: Secondary | ICD-10-CM | POA: Insufficient documentation

## 2019-11-24 DIAGNOSIS — Z01812 Encounter for preprocedural laboratory examination: Secondary | ICD-10-CM | POA: Diagnosis not present

## 2019-11-24 HISTORY — DX: Chronic obstructive pulmonary disease, unspecified: J44.9

## 2019-11-24 HISTORY — DX: Pneumonia, unspecified organism: J18.9

## 2019-11-24 HISTORY — DX: Constipation, unspecified: K59.00

## 2019-11-24 HISTORY — DX: Unspecified osteoarthritis, unspecified site: M19.90

## 2019-11-24 HISTORY — DX: Dyspnea, unspecified: R06.00

## 2019-11-24 LAB — BASIC METABOLIC PANEL
Anion gap: 9 (ref 5–15)
BUN: 25 mg/dL — ABNORMAL HIGH (ref 8–23)
CO2: 28 mmol/L (ref 22–32)
Calcium: 9.2 mg/dL (ref 8.9–10.3)
Chloride: 102 mmol/L (ref 98–111)
Creatinine, Ser: 0.77 mg/dL (ref 0.61–1.24)
GFR calc Af Amer: >60 mL/min (ref >60)
GFR calc non Af Amer: >60 mL/min (ref >60)
Glucose, Bld: 105 mg/dL — ABNORMAL HIGH (ref 70–99)
Potassium: 4.3 mmol/L (ref 3.5–5.1)
Sodium: 139 mmol/L (ref 135–145)

## 2019-11-24 LAB — SARS CORONAVIRUS 2 (TAT 6-24 HRS): SARS Coronavirus 2: NEGATIVE

## 2019-11-24 LAB — CBC
HCT: 52.4 % — ABNORMAL HIGH (ref 39.0–52.0)
Hemoglobin: 17.1 g/dL — ABNORMAL HIGH (ref 13.0–17.0)
MCH: 32.5 pg (ref 26.0–34.0)
MCHC: 32.6 g/dL (ref 30.0–36.0)
MCV: 99.6 fL (ref 80.0–100.0)
Platelets: 226 10*3/uL (ref 150–400)
RBC: 5.26 MIL/uL (ref 4.22–5.81)
RDW: 13 % (ref 11.5–15.5)
WBC: 9.2 10*3/uL (ref 4.0–10.5)
nRBC: 0 % (ref 0.0–0.2)

## 2019-11-24 LAB — ABO/RH: ABO/RH(D): O POS

## 2019-11-24 LAB — SURGICAL PCR SCREEN
MRSA, PCR: NEGATIVE
Staphylococcus aureus: NEGATIVE

## 2019-11-24 NOTE — Progress Notes (Addendum)
PCP - Dr Alvina Filbert  Cardiologist - Dr Diona Browner  Chest x-ray -   EKG - 05/19/2019  Stress Test -   ECHO - 04/2019  Cardiac Cath - no  Sleep Study - no CPAP - no CBC. BMP,T/S, LABS-  ASA-Instructed to hold for 7 days, approval given by Dr Diona Browner  ERAS-no  HA1C-na Fasting Blood Sugar - na Checks Blood Sugar __0___ times a day  Anesthesia-  Pt denies having chest pain, sob, or fever at this time. All instructions explained to the pt, with a verbal understanding of the material. Pt agrees to go over the instructions while at home for a better understanding. Pt also instructed to self quarantine after being tested for COVID-19. The opportunity to ask questions was provided.  I spoke with Erie Noe at Dr. Lonie Peak office and asked if they requested Cardiac clearance, Erie Noe said thy had and she faxed it to me.  Mr Sivils states that he has mild left sided chest pain "every now and then. Last time was sometime  Near the end of the year 2020. Patient denies lightheadedness, n/v, diaphoresis, he takes 1 NTG and it is gone. Mr Mahar states that Dr,. Diona Browner is aware.

## 2019-11-24 NOTE — Anesthesia Preprocedure Evaluation (Addendum)
Anesthesia Evaluation  Patient identified by MRN, date of birth, ID band Patient awake    Reviewed: Allergy & Precautions, NPO status , Patient's Chart, lab work & pertinent test results  Airway Mallampati: III   Neck ROM: Full    Dental  (+) Edentulous Upper, Partial Lower, Dental Advisory Given   Pulmonary Current Smoker and Patient abstained from smoking.,   Mild end-expiratory wheezes bilaterally   + wheezing      Cardiovascular hypertension,  Rhythm:Regular Rate:Normal     Neuro/Psych    GI/Hepatic   Endo/Other    Renal/GU      Musculoskeletal   Abdominal   Peds  Hematology   Anesthesia Other Findings   Reproductive/Obstetrics                            Anesthesia Physical Anesthesia Plan  ASA: III  Anesthesia Plan: General   Post-op Pain Management:    Induction: Intravenous  PONV Risk Score and Plan: Ondansetron and Dexamethasone  Airway Management Planned: Oral ETT  Additional Equipment:   Intra-op Plan:   Post-operative Plan:   Informed Consent: I have reviewed the patients History and Physical, chart, labs and discussed the procedure including the risks, benefits and alternatives for the proposed anesthesia with the patient or authorized representative who has indicated his/her understanding and acceptance.     Dental advisory given  Plan Discussed with: CRNA and Anesthesiologist  Anesthesia Plan Comments: (PAT note written 11/24/2019 by Shonna Chock, PA-C. )       Anesthesia Quick Evaluation

## 2019-11-24 NOTE — Progress Notes (Signed)
Anesthesia Chart Review:  Case: [id number] Date/Time: 11/27/19 1200   Procedure: PLIF - L4-L5 (N/A Back)   Anesthesia type: General   Pre-op diagnosis: Spondylolisthesis   [facility] ROOM 20 / [facility] OR   Surgeons: Donalee Citrin, MD      DISCUSSION: Patient is a 64 year old male scheduled for the above procedure.  History includes smoking, CAD (coronary calcification suggesting CAD 09/24/16 chest CT), HTN, COPD, exertional dyspnea, colon mass (s/p resection 1980's), essential tremor, chronic back pain, collagen vascular disease (not specified).   Cardiologist Dr. Diona Browner signed a note of cardiac clearance classifying him at "moderate" risk with permission to hold ASA for 7 days.   11/24/2019 presurgical COVID-19 test is in process.  Anesthesia team to evaluate on the day of surgery.  VS: BP 121/78   Pulse 61   Temp 37.1 C   Resp 18   Ht 5\' 10"  (1.778 m)   Wt 78.3 kg   SpO2 94%   BMI 24.78 kg/m    PROVIDERS: , MD is PCP  Alvina Filbert, MD is cardiologist. Last evaluation 09/15/19.  Continue medical therapy for CAD.  Patient declined statin therapy.  46-month follow-up recommended.   LABS: Labs reviewed: Acceptable for surgery. (all labs ordered are listed, but only abnormal results are displayed)  Labs Reviewed  BASIC METABOLIC PANEL - Abnormal; Notable for the following components:      Result Value   Glucose, Bld 105 (*)    BUN 25 (*)    All other components within normal limits  CBC - Abnormal; Notable for the following components:   Hemoglobin 17.1 (*)    HCT 52.4 (*)    All other components within normal limits  SURGICAL PCR SCREEN  TYPE AND SCREEN  ABO/RH     IMAGES: MRI T/L Spine 08/25/19: IMPRESSION: Thoracic spine: 1. Small right paracentral disc protrusion at T6-T7 with flattening and deformity of the right ventral cord. Lumbar spine: 1. Unchanged moderate to severe spinal canal and lateral recess stenosis at L4-L5. 2. Unchanged  extraforaminal left-sided synovial cyst at L4-L5 with slight anterior displacement of the exiting left L4 nerve root without impingement. 3. Postsurgical changes at L5-S1 without recurrent stenosis or impingement.   EKG: 05/18/19: Normal sinus rhythm Rightward axis Borderline ECG Confirmed by 05/20/19 (Donnetta Hutching) on 05/26/2019 2:53:40 PM   CV: Nuclear stress test 07/08/17:  The study is normal.  This is a low risk study.  The left ventricular ejection fraction is normal (55-65%).  There was no ST segment deviation noted during stress. Diaphragmatic attenuation no ischemia. SDS 1. Normal wall motion EF 60%     Past Medical History:  Diagnosis Date  . Arthritis   . Chronic back pain   . Collagen vascular disease (HCC)   . Colonic mass    History, s/p removal; per patient.  . Constipation   . COPD (chronic obstructive pulmonary disease) (HCC)   . Coronary atherosclerosis of native coronary artery    Dense coronary atherosclerosis by chest CT November 2014   . Dyspnea    when walking distance  . Essential hypertension   . Essential tremor   . History of chicken pox   . History of December 2014 measles   . Pneumonia    2015 ish  . Sciatica     Past Surgical History:  Procedure Laterality Date  . CIRCUMCISION    . COLON SURGERY  '80's   for mass removal, colonostomy  . COLONOSCOPY WITH PROPOFOL  N/A 05/03/2017   Procedure: COLONOSCOPY WITH PROPOFOL;  Surgeon: Daneil Dolin, MD;  [facility]: [facility];  Service: Endoscopy;  Laterality: N/A;  1015  . colonostomy reversal  80's  . Lumber discectomy    . Middle finger Right    fracture  . NASAL SEPTUM SURGERY    . POLYPECTOMY  05/03/2017   Procedure: POLYPECTOMY;  Surgeon: Daneil Dolin, MD;  [facility];  Service: Endoscopy;;  descending colon and rectal  . SHOULDER ACROMIOPLASTY Right 06/12/2014   Procedure: RIGHT SHOULDER ACROMIOPLASTY;  Surgeon: [name], MD;  Location: [facility];  Service:  Orthopedics;  Laterality: Right;  . SHOULDER OPEN ROTATOR CUFF REPAIR Right 06/12/2014   Procedure: OPEN REPAIR ROTATOR CUFF RIGHT ;  Surgeon: [name], MD;  Location: [facility];  Service: Orthopedics;  Laterality: Right;  . TONSILLECTOMY    . TYMPANOSTOMY TUBE PLACEMENT      MEDICATIONS: . albuterol (PROVENTIL HFA;VENTOLIN HFA) 108 (90 Base) MCG/ACT inhaler  . amLODipine (NORVASC) 5 MG tablet  . aspirin EC 81 MG tablet  . gabapentin (NEURONTIN) 600 MG tablet  . ipratropium-albuterol (DUONEB) 0.5-2.5 (3) MG/3ML SOLN  . linaclotide (LINZESS) 290 MCG CAPS capsule  . metoprolol succinate (TOPROL-XL) 25 MG 24 hr tablet  . naproxen (NAPROSYN) 500 MG tablet  . nitroGLYCERIN (NITROSTAT) 0.4 MG SL tablet  . oxyCODONE-acetaminophen (PERCOCET) 7.5-325 MG tablet  . temazepam (RESTORIL) 15 MG capsule  . tiZANidine (ZANAFLEX) 4 MG tablet   No current facility-administered medications for this encounter.    Myra Gianotti, PA-C Surgical Short Stay/Anesthesiology [facility] Phone [phone] [facility] Phone [phone] 11/24/2019 4:29 PM

## 2019-11-24 NOTE — Progress Notes (Signed)
error 

## 2019-11-25 LAB — TYPE AND SCREEN
ABO/RH(D): O POS
Antibody Screen: NEGATIVE

## 2019-11-27 ENCOUNTER — Inpatient Hospital Stay (HOSPITAL_COMMUNITY): Payer: Medicare Other

## 2019-11-27 ENCOUNTER — Encounter (HOSPITAL_COMMUNITY)
Admission: RE | Disposition: A | Discharge status: Home / Self Care | Payer: Self-pay | Source: Home / Self Care | Attending: Neurosurgery

## 2019-11-27 ENCOUNTER — Observation Stay (HOSPITAL_COMMUNITY)
Admission: RE | Admit: 2019-11-27 | Discharge: 2019-11-28 | Disposition: A | Discharge status: Home / Self Care | Payer: Medicare Other | Attending: Neurosurgery | Admitting: Neurosurgery

## 2019-11-27 ENCOUNTER — Encounter (HOSPITAL_COMMUNITY): Payer: Self-pay | Admitting: Neurosurgery

## 2019-11-27 ENCOUNTER — Inpatient Hospital Stay (HOSPITAL_COMMUNITY): Payer: Medicare Other | Admitting: Anesthesiology

## 2019-11-27 ENCOUNTER — Other Ambulatory Visit: Payer: Self-pay

## 2019-11-27 DIAGNOSIS — I10 Essential (primary) hypertension: Secondary | ICD-10-CM | POA: Diagnosis not present

## 2019-11-27 DIAGNOSIS — M549 Dorsalgia, unspecified: Secondary | ICD-10-CM | POA: Diagnosis not present

## 2019-11-27 DIAGNOSIS — M4316 Spondylolisthesis, lumbar region: Secondary | ICD-10-CM

## 2019-11-27 DIAGNOSIS — M359 Systemic involvement of connective tissue, unspecified: Secondary | ICD-10-CM | POA: Insufficient documentation

## 2019-11-27 DIAGNOSIS — K5900 Constipation, unspecified: Secondary | ICD-10-CM | POA: Insufficient documentation

## 2019-11-27 DIAGNOSIS — M48061 Spinal stenosis, lumbar region without neurogenic claudication: Secondary | ICD-10-CM | POA: Diagnosis not present

## 2019-11-27 DIAGNOSIS — Z7982 Long term (current) use of aspirin: Secondary | ICD-10-CM | POA: Diagnosis not present

## 2019-11-27 DIAGNOSIS — M1990 Unspecified osteoarthritis, unspecified site: Secondary | ICD-10-CM | POA: Insufficient documentation

## 2019-11-27 DIAGNOSIS — Z79899 Other long term (current) drug therapy: Secondary | ICD-10-CM | POA: Insufficient documentation

## 2019-11-27 DIAGNOSIS — Z885 Allergy status to narcotic agent status: Secondary | ICD-10-CM | POA: Diagnosis not present

## 2019-11-27 DIAGNOSIS — Z791 Long term (current) use of non-steroidal anti-inflammatories (NSAID): Secondary | ICD-10-CM | POA: Diagnosis not present

## 2019-11-27 DIAGNOSIS — F17210 Nicotine dependence, cigarettes, uncomplicated: Secondary | ICD-10-CM | POA: Diagnosis not present

## 2019-11-27 DIAGNOSIS — G250 Essential tremor: Secondary | ICD-10-CM | POA: Insufficient documentation

## 2019-11-27 DIAGNOSIS — Z86010 Personal history of colonic polyps: Secondary | ICD-10-CM | POA: Diagnosis not present

## 2019-11-27 DIAGNOSIS — Z419 Encounter for procedure for purposes other than remedying health state, unspecified: Secondary | ICD-10-CM

## 2019-11-27 DIAGNOSIS — J449 Chronic obstructive pulmonary disease, unspecified: Secondary | ICD-10-CM | POA: Diagnosis not present

## 2019-11-27 DIAGNOSIS — M5430 Sciatica, unspecified side: Secondary | ICD-10-CM | POA: Insufficient documentation

## 2019-11-27 DIAGNOSIS — M7138 Other bursal cyst, other site: Secondary | ICD-10-CM | POA: Insufficient documentation

## 2019-11-27 DIAGNOSIS — G8929 Other chronic pain: Secondary | ICD-10-CM | POA: Diagnosis not present

## 2019-11-27 DIAGNOSIS — I2510 Atherosclerotic heart disease of native coronary artery without angina pectoris: Secondary | ICD-10-CM | POA: Diagnosis not present

## 2019-11-27 DIAGNOSIS — M5416 Radiculopathy, lumbar region: Secondary | ICD-10-CM | POA: Diagnosis not present

## 2019-11-27 HISTORY — PX: LUMBAR FUSION: SHX111

## 2019-11-27 SURGERY — POSTERIOR LUMBAR FUSION 1 LEVEL
Anesthesia: General | Site: Back

## 2019-11-27 MED ORDER — IPRATROPIUM-ALBUTEROL 0.5-2.5 (3) MG/3ML IN SOLN: 3.0000 mL | Freq: Four times a day (QID) | RESPIRATORY_TRACT | Status: DC | PRN

## 2019-11-27 MED ORDER — MENTHOL 3 MG MT LOZG: 1.0000 | LOZENGE | OROMUCOSAL | Status: DC | PRN

## 2019-11-27 MED ORDER — ACETAMINOPHEN 325 MG PO TABS: 650.0000 mg | ORAL_TABLET | ORAL | Status: DC | PRN

## 2019-11-27 MED ORDER — BUPIVACAINE HCL (PF) 0.25 % IJ SOLN
INTRAMUSCULAR | Status: AC
  Filled 2019-11-27: qty 30

## 2019-11-27 MED ORDER — GABAPENTIN 600 MG PO TABS
600.0000 mg | ORAL_TABLET | Freq: Three times a day (TID) | ORAL | Status: DC
  Administered 2019-11-27 – 2019-11-28 (×3): 600 mg via ORAL
  Filled 2019-11-27 (×3): qty 1

## 2019-11-27 MED ORDER — LIDOCAINE 2% (20 MG/ML) 5 ML SYRINGE
INTRAMUSCULAR | Status: AC
  Filled 2019-11-27: qty 5

## 2019-11-27 MED ORDER — BUPIVACAINE LIPOSOME 1.3 % IJ SUSP
20.0000 mL | Freq: Once | INTRAMUSCULAR | Status: DC
  Filled 2019-11-27: qty 20

## 2019-11-27 MED ORDER — TEMAZEPAM 15 MG PO CAPS
45.0000 mg | ORAL_CAPSULE | Freq: Every day | ORAL | Status: DC
  Administered 2019-11-27: 45 mg via ORAL
  Filled 2019-11-27: qty 3

## 2019-11-27 MED ORDER — ARTHREX ANGEL - ACD-A SOLUTION (CHARTING ONLY) OPTIME
TOPICAL | Status: DC | PRN
  Administered 2019-11-27: 20 mL via TOPICAL

## 2019-11-27 MED ORDER — OXYCODONE-ACETAMINOPHEN 7.5-325 MG PO TABS: 1.0000 | ORAL_TABLET | ORAL | Status: DC | PRN

## 2019-11-27 MED ORDER — TIZANIDINE HCL 4 MG PO TABS
4.0000 mg | ORAL_TABLET | Freq: Three times a day (TID) | ORAL | Status: DC
  Administered 2019-11-27 – 2019-11-28 (×3): 4 mg via ORAL
  Filled 2019-11-27 (×3): qty 1

## 2019-11-27 MED ORDER — NITROGLYCERIN 0.4 MG SL SUBL: 0.4000 mg | SUBLINGUAL_TABLET | SUBLINGUAL | Status: DC | PRN

## 2019-11-27 MED ORDER — DEXAMETHASONE SODIUM PHOSPHATE 10 MG/ML IJ SOLN
10.0000 mg | Freq: Once | INTRAMUSCULAR | Status: AC
  Administered 2019-11-27: 10 mg via INTRAVENOUS
  Filled 2019-11-27: qty 1

## 2019-11-27 MED ORDER — HEPARIN SODIUM (PORCINE) 1000 UNIT/ML IJ SOLN
INTRAMUSCULAR | Status: DC | PRN
  Administered 2019-11-27: 5000 [IU]

## 2019-11-27 MED ORDER — KETAMINE HCL 50 MG/5ML IJ SOSY
PREFILLED_SYRINGE | INTRAMUSCULAR | Status: AC
  Filled 2019-11-27: qty 5

## 2019-11-27 MED ORDER — LIDOCAINE-EPINEPHRINE 1 %-1:100000 IJ SOLN
INTRAMUSCULAR | Status: DC | PRN
  Administered 2019-11-27: 10 mL

## 2019-11-27 MED ORDER — LIDOCAINE-EPINEPHRINE 1 %-1:100000 IJ SOLN
INTRAMUSCULAR | Status: AC
  Filled 2019-11-27: qty 1

## 2019-11-27 MED ORDER — ONDANSETRON HCL 4 MG/2ML IJ SOLN: 4.0000 mg | Freq: Once | INTRAMUSCULAR | Status: DC | PRN

## 2019-11-27 MED ORDER — THROMBIN 5000 UNITS EX SOLR
CUTANEOUS | Status: AC
  Filled 2019-11-27: qty 5000

## 2019-11-27 MED ORDER — SUGAMMADEX SODIUM 200 MG/2ML IV SOLN
INTRAVENOUS | Status: DC | PRN
  Administered 2019-11-27: 160 mg via INTRAVENOUS

## 2019-11-27 MED ORDER — SODIUM CHLORIDE 0.9 % IV SOLN: 250.0000 mL | INTRAVENOUS | Status: DC

## 2019-11-27 MED ORDER — ONDANSETRON HCL 4 MG/2ML IJ SOLN
INTRAMUSCULAR | Status: AC
  Filled 2019-11-27: qty 2

## 2019-11-27 MED ORDER — ALUM & MAG HYDROXIDE-SIMETH 200-200-20 MG/5ML PO SUSP: 30.0000 mL | Freq: Four times a day (QID) | ORAL | Status: DC | PRN

## 2019-11-27 MED ORDER — CYCLOBENZAPRINE HCL 10 MG PO TABS
10.0000 mg | ORAL_TABLET | Freq: Three times a day (TID) | ORAL | Status: DC | PRN
  Administered 2019-11-28: 10 mg via ORAL
  Filled 2019-11-27 (×2): qty 1

## 2019-11-27 MED ORDER — ONDANSETRON HCL 4 MG PO TABS: 4.0000 mg | ORAL_TABLET | Freq: Four times a day (QID) | ORAL | Status: DC | PRN

## 2019-11-27 MED ORDER — LINACLOTIDE 145 MCG PO CAPS
290.0000 ug | ORAL_CAPSULE | Freq: Every day | ORAL | Status: DC
  Administered 2019-11-27 – 2019-11-28 (×2): 290 ug via ORAL
  Filled 2019-11-27 (×2): qty 2

## 2019-11-27 MED ORDER — ALBUTEROL SULFATE (2.5 MG/3ML) 0.083% IN NEBU
INHALATION_SOLUTION | RESPIRATORY_TRACT | Status: AC
  Filled 2019-11-27: qty 3

## 2019-11-27 MED ORDER — HYDROMORPHONE HCL 1 MG/ML IJ SOLN
0.2500 mg | INTRAMUSCULAR | Status: DC | PRN
  Administered 2019-11-27 (×4): 0.5 mg via INTRAVENOUS

## 2019-11-27 MED ORDER — ASPIRIN EC 81 MG PO TBEC
81.0000 mg | DELAYED_RELEASE_TABLET | Freq: Every day | ORAL | Status: DC
  Administered 2019-11-27 – 2019-11-28 (×2): 81 mg via ORAL
  Filled 2019-11-27 (×2): qty 1

## 2019-11-27 MED ORDER — SODIUM CHLORIDE 0.9% FLUSH
3.0000 mL | Freq: Two times a day (BID) | INTRAVENOUS | Status: DC
  Administered 2019-11-27: 3 mL via INTRAVENOUS

## 2019-11-27 MED ORDER — HYDROMORPHONE HCL 1 MG/ML IJ SOLN
INTRAMUSCULAR | Status: AC
  Filled 2019-11-27: qty 1

## 2019-11-27 MED ORDER — HEPARIN SODIUM (PORCINE) 1000 UNIT/ML IJ SOLN
INTRAMUSCULAR | Status: AC
  Filled 2019-11-27: qty 1

## 2019-11-27 MED ORDER — CHLORHEXIDINE GLUCONATE CLOTH 2 % EX PADS: 6.0000 | MEDICATED_PAD | Freq: Once | CUTANEOUS | Status: DC

## 2019-11-27 MED ORDER — DEXMEDETOMIDINE HCL IN NACL 200 MCG/50ML IV SOLN
INTRAVENOUS | Status: DC | PRN
  Administered 2019-11-27 (×3): 8 ug via INTRAVENOUS

## 2019-11-27 MED ORDER — ROCURONIUM BROMIDE 10 MG/ML (PF) SYRINGE
PREFILLED_SYRINGE | INTRAVENOUS | Status: DC | PRN
  Administered 2019-11-27 (×2): 50 mg via INTRAVENOUS
  Administered 2019-11-27: 30 mg via INTRAVENOUS

## 2019-11-27 MED ORDER — ACETAMINOPHEN 10 MG/ML IV SOLN
INTRAVENOUS | Status: AC
  Filled 2019-11-27: qty 100

## 2019-11-27 MED ORDER — CEFAZOLIN SODIUM-DEXTROSE 2-4 GM/100ML-% IV SOLN
2.0000 g | Freq: Three times a day (TID) | INTRAVENOUS | Status: DC
  Administered 2019-11-27 – 2019-11-28 (×2): 2 g via INTRAVENOUS
  Filled 2019-11-27 (×2): qty 100

## 2019-11-27 MED ORDER — PHENYLEPHRINE HCL-NACL 10-0.9 MG/250ML-% IV SOLN
INTRAVENOUS | Status: DC | PRN
  Administered 2019-11-27: 20 ug/min via INTRAVENOUS

## 2019-11-27 MED ORDER — 0.9 % SODIUM CHLORIDE (POUR BTL) OPTIME
TOPICAL | Status: DC | PRN
  Administered 2019-11-27: 1000 mL

## 2019-11-27 MED ORDER — ACETAMINOPHEN 650 MG RE SUPP: 650.0000 mg | RECTAL | Status: DC | PRN

## 2019-11-27 MED ORDER — HYDROMORPHONE HCL 1 MG/ML IJ SOLN
INTRAMUSCULAR | Status: AC
  Filled 2019-11-27: qty 0.5

## 2019-11-27 MED ORDER — MIDAZOLAM HCL 2 MG/2ML IJ SOLN
INTRAMUSCULAR | Status: AC
  Filled 2019-11-27: qty 2

## 2019-11-27 MED ORDER — THROMBIN 20000 UNITS EX SOLR
CUTANEOUS | Status: AC
  Filled 2019-11-27: qty 20000

## 2019-11-27 MED ORDER — PROPOFOL 10 MG/ML IV BOLUS
INTRAVENOUS | Status: DC | PRN
  Administered 2019-11-27: 125 mg via INTRAVENOUS

## 2019-11-27 MED ORDER — HYDROMORPHONE HCL 1 MG/ML IJ SOLN
INTRAMUSCULAR | Status: DC | PRN
  Administered 2019-11-27: .5 mg via INTRAVENOUS

## 2019-11-27 MED ORDER — THROMBIN 20000 UNITS EX SOLR
CUTANEOUS | Status: DC | PRN
  Administered 2019-11-27: 20 mL

## 2019-11-27 MED ORDER — KETAMINE HCL 10 MG/ML IJ SOLN
INTRAMUSCULAR | Status: DC | PRN
  Administered 2019-11-27: 10 mg via INTRAVENOUS
  Administered 2019-11-27: 20 mg via INTRAVENOUS
  Administered 2019-11-27 (×2): 10 mg via INTRAVENOUS

## 2019-11-27 MED ORDER — ALBUMIN HUMAN 5 % IV SOLN: INTRAVENOUS | Status: DC | PRN

## 2019-11-27 MED ORDER — CEFAZOLIN SODIUM-DEXTROSE 2-4 GM/100ML-% IV SOLN
2.0000 g | INTRAVENOUS | Status: AC
  Administered 2019-11-27: 2 g via INTRAVENOUS

## 2019-11-27 MED ORDER — METOPROLOL SUCCINATE ER 25 MG PO TB24
25.0000 mg | ORAL_TABLET | Freq: Every day | ORAL | Status: DC
  Administered 2019-11-27: 25 mg via ORAL
  Filled 2019-11-27: qty 1

## 2019-11-27 MED ORDER — LACTATED RINGERS IV SOLN: INTRAVENOUS | Status: DC

## 2019-11-27 MED ORDER — DEXAMETHASONE SODIUM PHOSPHATE 10 MG/ML IJ SOLN
INTRAMUSCULAR | Status: AC
  Filled 2019-11-27: qty 1

## 2019-11-27 MED ORDER — METOPROLOL SUCCINATE ER 25 MG PO TB24: 25.0000 mg | ORAL_TABLET | ORAL | Status: DC

## 2019-11-27 MED ORDER — OXYCODONE HCL 5 MG PO TABS
10.0000 mg | ORAL_TABLET | ORAL | Status: DC | PRN
  Administered 2019-11-27 – 2019-11-28 (×6): 10 mg via ORAL
  Filled 2019-11-27 (×6): qty 2

## 2019-11-27 MED ORDER — FENTANYL CITRATE (PF) 250 MCG/5ML IJ SOLN
INTRAMUSCULAR | Status: AC
  Filled 2019-11-27: qty 5

## 2019-11-27 MED ORDER — AMLODIPINE BESYLATE 5 MG PO TABS
5.0000 mg | ORAL_TABLET | Freq: Every day | ORAL | Status: DC
  Administered 2019-11-28: 5 mg via ORAL
  Filled 2019-11-27: qty 1

## 2019-11-27 MED ORDER — ALBUTEROL SULFATE (2.5 MG/3ML) 0.083% IN NEBU: 3.0000 mL | INHALATION_SOLUTION | Freq: Four times a day (QID) | RESPIRATORY_TRACT | Status: DC | PRN

## 2019-11-27 MED ORDER — HYDROMORPHONE HCL 1 MG/ML IJ SOLN
0.5000 mg | INTRAMUSCULAR | Status: DC | PRN
  Administered 2019-11-27: 0.5 mg via INTRAVENOUS
  Filled 2019-11-27: qty 0.5

## 2019-11-27 MED ORDER — ACETAMINOPHEN 10 MG/ML IV SOLN
INTRAVENOUS | Status: DC | PRN
  Administered 2019-11-27: 1000 mg via INTRAVENOUS

## 2019-11-27 MED ORDER — ROCURONIUM BROMIDE 10 MG/ML (PF) SYRINGE
PREFILLED_SYRINGE | INTRAVENOUS | Status: AC
  Filled 2019-11-27: qty 20

## 2019-11-27 MED ORDER — ONDANSETRON HCL 4 MG/2ML IJ SOLN: 4.0000 mg | Freq: Four times a day (QID) | INTRAMUSCULAR | Status: DC | PRN

## 2019-11-27 MED ORDER — PHENOL 1.4 % MT LIQD: 1.0000 | OROMUCOSAL | Status: DC | PRN

## 2019-11-27 MED ORDER — PANTOPRAZOLE SODIUM 40 MG PO TBEC
40.0000 mg | DELAYED_RELEASE_TABLET | Freq: Every day | ORAL | Status: DC
  Administered 2019-11-27: 40 mg via ORAL
  Filled 2019-11-27: qty 1

## 2019-11-27 MED ORDER — ALPRAZOLAM 0.5 MG PO TABS: 1.0000 mg | ORAL_TABLET | Freq: Every evening | ORAL | Status: DC | PRN

## 2019-11-27 MED ORDER — ALBUTEROL SULFATE (2.5 MG/3ML) 0.083% IN NEBU
2.5000 mg | INHALATION_SOLUTION | Freq: Once | RESPIRATORY_TRACT | Status: AC
  Administered 2019-11-27: 2.5 mg via RESPIRATORY_TRACT

## 2019-11-27 MED ORDER — PANTOPRAZOLE SODIUM 40 MG IV SOLR: 40.0000 mg | Freq: Every day | INTRAVENOUS | Status: DC

## 2019-11-27 MED ORDER — FENTANYL CITRATE (PF) 250 MCG/5ML IJ SOLN
INTRAMUSCULAR | Status: DC | PRN
  Administered 2019-11-27: 100 ug via INTRAVENOUS
  Administered 2019-11-27: 50 ug via INTRAVENOUS
  Administered 2019-11-27: 100 ug via INTRAVENOUS

## 2019-11-27 MED ORDER — MIDAZOLAM HCL 5 MG/5ML IJ SOLN
INTRAMUSCULAR | Status: DC | PRN
  Administered 2019-11-27: 2 mg via INTRAVENOUS

## 2019-11-27 MED ORDER — SODIUM CHLORIDE (PF) 0.9 % IJ SOLN
INTRAMUSCULAR | Status: DC | PRN
  Administered 2019-11-27: 5 mL

## 2019-11-27 MED ORDER — ONDANSETRON HCL 4 MG/2ML IJ SOLN
INTRAMUSCULAR | Status: DC | PRN
  Administered 2019-11-27: 4 mg via INTRAVENOUS

## 2019-11-27 MED ORDER — SODIUM CHLORIDE 0.9 % IV SOLN
INTRAVENOUS | Status: DC | PRN
  Administered 2019-11-27: 500 mL

## 2019-11-27 MED ORDER — LIDOCAINE 2% (20 MG/ML) 5 ML SYRINGE
INTRAMUSCULAR | Status: DC | PRN
  Administered 2019-11-27: 100 mg via INTRAVENOUS

## 2019-11-27 MED ORDER — CEFAZOLIN SODIUM-DEXTROSE 2-4 GM/100ML-% IV SOLN
INTRAVENOUS | Status: AC
  Filled 2019-11-27: qty 100

## 2019-11-27 MED ORDER — METOPROLOL SUCCINATE ER 50 MG PO TB24
50.0000 mg | ORAL_TABLET | Freq: Every day | ORAL | Status: DC
  Administered 2019-11-28: 50 mg via ORAL
  Filled 2019-11-27: qty 1

## 2019-11-27 MED ORDER — SODIUM CHLORIDE 0.9% FLUSH: 3.0000 mL | INTRAVENOUS | Status: DC | PRN

## 2019-11-27 SURGICAL SUPPLY — 79 items
BAG DECANTER FOR FLEXI CONT (MISCELLANEOUS) ×3 IMPLANT
BASKET BONE COLLECTION (BASKET) ×3 IMPLANT
BENZOIN TINCTURE PRP APPL 2/3 (GAUZE/BANDAGES/DRESSINGS) ×3 IMPLANT
BIT DRILL 5.0/4.0 (BIT) ×1 IMPLANT
BLADE CLIPPER SURG (BLADE) IMPLANT
BLADE SURG 11 STRL SS (BLADE) ×3 IMPLANT
BUR CUTTER 7.0 ROUND (BURR) ×3 IMPLANT
BUR MATCHSTICK NEURO 3.0 LAGG (BURR) ×3 IMPLANT
CANISTER SUCT 3000ML PPV (MISCELLANEOUS) ×3 IMPLANT
CAP LOCKING (Cap) ×8 IMPLANT
CAP LOCKING 5.5 CREO (Cap) ×4 IMPLANT
CARTRIDGE OIL MAESTRO DRILL (MISCELLANEOUS) ×1 IMPLANT
CLOSURE WOUND 1/2 X4 (GAUZE/BANDAGES/DRESSINGS) ×1
CNTNR URN SCR LID CUP LEK RST (MISCELLANEOUS) ×1 IMPLANT
CONT SPEC 4OZ STRL OR WHT (MISCELLANEOUS) ×2
COVER BACK TABLE 60X90IN (DRAPES) ×3 IMPLANT
COVER WAND RF STERILE (DRAPES) ×3 IMPLANT
DECANTER SPIKE VIAL GLASS SM (MISCELLANEOUS) ×3 IMPLANT
DERMABOND ADVANCED (GAUZE/BANDAGES/DRESSINGS) ×2
DERMABOND ADVANCED .7 DNX12 (GAUZE/BANDAGES/DRESSINGS) ×1 IMPLANT
DIFFUSER DRILL AIR PNEUMATIC (MISCELLANEOUS) ×3 IMPLANT
DRAPE C-ARM 42X72 X-RAY (DRAPES) ×6 IMPLANT
DRAPE C-ARMOR (DRAPES) IMPLANT
DRAPE HALF SHEET 40X57 (DRAPES) IMPLANT
DRAPE LAPAROTOMY 100X72X124 (DRAPES) ×3 IMPLANT
DRAPE SURG 17X23 STRL (DRAPES) ×3 IMPLANT
DRILL 5.0/4.0 (BIT) ×3
DRSG OPSITE 4X5.5 SM (GAUZE/BANDAGES/DRESSINGS) ×3 IMPLANT
DRSG OPSITE POSTOP 4X6 (GAUZE/BANDAGES/DRESSINGS) ×3 IMPLANT
DURAPREP 26ML APPLICATOR (WOUND CARE) ×3 IMPLANT
ELECT REM PT RETURN 9FT ADLT (ELECTROSURGICAL) ×3
ELECTRODE REM PT RTRN 9FT ADLT (ELECTROSURGICAL) ×1 IMPLANT
EVACUATOR 3/16  PVC DRAIN (DRAIN) ×2
EVACUATOR 3/16 PVC DRAIN (DRAIN) ×1 IMPLANT
GAUZE 4X4 16PLY RFD (DISPOSABLE) IMPLANT
GAUZE SPONGE 4X4 12PLY STRL (GAUZE/BANDAGES/DRESSINGS) ×3 IMPLANT
GLOVE BIO SURGEON STRL SZ7 (GLOVE) ×3 IMPLANT
GLOVE BIO SURGEON STRL SZ8 (GLOVE) ×6 IMPLANT
GLOVE BIOGEL PI IND STRL 7.0 (GLOVE) ×1 IMPLANT
GLOVE BIOGEL PI IND STRL 7.5 (GLOVE) ×1 IMPLANT
GLOVE BIOGEL PI IND STRL 8 (GLOVE) ×1 IMPLANT
GLOVE BIOGEL PI INDICATOR 7.0 (GLOVE) ×2
GLOVE BIOGEL PI INDICATOR 7.5 (GLOVE) ×2
GLOVE BIOGEL PI INDICATOR 8 (GLOVE) ×2
GLOVE EXAM NITRILE XL STR (GLOVE) IMPLANT
GLOVE INDICATOR 8.5 STRL (GLOVE) ×6 IMPLANT
GLOVE SURG SS PI 6.5 STRL IVOR (GLOVE) ×9 IMPLANT
GLOVE SURG SS PI 7.0 STRL IVOR (GLOVE) ×6 IMPLANT
GOWN STRL REUS W/ TWL LRG LVL3 (GOWN DISPOSABLE) ×2 IMPLANT
GOWN STRL REUS W/ TWL XL LVL3 (GOWN DISPOSABLE) ×3 IMPLANT
GOWN STRL REUS W/TWL 2XL LVL3 (GOWN DISPOSABLE) IMPLANT
GOWN STRL REUS W/TWL LRG LVL3 (GOWN DISPOSABLE) ×4
GOWN STRL REUS W/TWL XL LVL3 (GOWN DISPOSABLE) ×6
HEMOSTAT POWDER KIT SURGIFOAM (HEMOSTASIS) IMPLANT
KIT BASIN OR (CUSTOM PROCEDURE TRAY) ×3 IMPLANT
KIT BONE MRW ASP ANGEL CPRP (KITS) ×3 IMPLANT
KIT TURNOVER KIT B (KITS) ×3 IMPLANT
MILL MEDIUM DISP (BLADE) ×3 IMPLANT
NEEDLE HYPO 25X1 1.5 SAFETY (NEEDLE) ×3 IMPLANT
NS IRRIG 1000ML POUR BTL (IV SOLUTION) ×3 IMPLANT
OIL CARTRIDGE MAESTRO DRILL (MISCELLANEOUS) ×3
PACK LAMINECTOMY NEURO (CUSTOM PROCEDURE TRAY) ×3 IMPLANT
PAD ARMBOARD 7.5X6 YLW CONV (MISCELLANEOUS) IMPLANT
PUTTY DBM ALLOSYNC PURE 10CC (Putty) ×3 IMPLANT
ROD SPINAL 35MM (Rod) ×6 IMPLANT
SHAFT CREO 30MM (Neuro Prosthesis/Implant) ×12 IMPLANT
SPACER SUSTAIN RT 13 15D 10X26 (Spacer) ×6 IMPLANT
SPONGE LAP 4X18 RFD (DISPOSABLE) IMPLANT
SPONGE SURGIFOAM ABS GEL 100 (HEMOSTASIS) ×3 IMPLANT
STRIP CLOSURE SKIN 1/2X4 (GAUZE/BANDAGES/DRESSINGS) ×2 IMPLANT
SUT VIC AB 0 CT1 18XCR BRD8 (SUTURE) ×1 IMPLANT
SUT VIC AB 0 CT1 8-18 (SUTURE) ×2
SUT VIC AB 2-0 CT1 18 (SUTURE) ×3 IMPLANT
SUT VIC AB 4-0 PS2 27 (SUTURE) ×3 IMPLANT
TOWEL GREEN STERILE (TOWEL DISPOSABLE) ×3 IMPLANT
TOWEL GREEN STERILE FF (TOWEL DISPOSABLE) ×3 IMPLANT
TRAY FOLEY MTR SLVR 16FR STAT (SET/KITS/TRAYS/PACK) ×3 IMPLANT
TULIP CREP AMP 5.5MM (Orthopedic Implant) ×12 IMPLANT
WATER STERILE IRR 1000ML POUR (IV SOLUTION) ×3 IMPLANT

## 2019-11-27 NOTE — H&P (Signed)
Peter Campbell is an 64 y.o. male.   Chief Complaint: Back right greater than left leg pain HPI: Patient is very pleasant 64 year old gentleman has had progressive worsening back and bilateral hip and leg pain going on now for several months.  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  Due to patient's progression of clinical syndrome imaging findings and failed conservative treatment of recommended decompression stabilization procedure.  I extensively gone over the risks and benefits of that operation with him as well as perioperative course expectations of outcome and alternatives of surgery and he understood and agreed to proceed forward.  Past Medical History:  Diagnosis Date  . Arthritis   . Chronic back pain   . Collagen vascular disease (HCC)   . Colonic mass    History, s/p removal; per patient.  . Constipation   . COPD (chronic obstructive pulmonary disease) (HCC)   . Coronary atherosclerosis of native coronary artery    Dense coronary atherosclerosis by chest CT November 2014   . Dyspnea    when walking distance  . Essential hypertension   . Essential tremor   . History of chicken pox   . History of [place] measles   . Pneumonia    2015 ish  . Sciatica     Past Surgical History:  Procedure Laterality Date  . CIRCUMCISION    . COLON SURGERY  '80's   for mass removal, colonostomy  . COLONOSCOPY WITH PROPOFOL N/A 05/03/2017   Procedure: COLONOSCOPY WITH PROPOFOL;  Surgeon: Corbin Ade, MD;  Location: [facility];  Service: Endoscopy;  Laterality: N/A;  1015  . colonostomy reversal  80's  . Lumber discectomy    . Middle finger Right    fracture  . NASAL SEPTUM SURGERY    . POLYPECTOMY  05/03/2017   Procedure: POLYPECTOMY;  Surgeon: Corbin Ade, MD;  Location: [facility];  Service: Endoscopy;;  descending colon and rectal  . SHOULDER ACROMIOPLASTY Right 06/12/2014   Procedure: RIGHT SHOULDER ACROMIOPLASTY;   Surgeon: Darreld Mclean, MD;  Location: [facility];  Service: Orthopedics;  Laterality: Right;  . SHOULDER OPEN ROTATOR CUFF REPAIR Right 06/12/2014   Procedure: OPEN REPAIR ROTATOR CUFF RIGHT ;  Surgeon: Darreld Mclean, MD;  Location: [facility];  Service: Orthopedics;  Laterality: Right;  . TONSILLECTOMY    . TYMPANOSTOMY TUBE PLACEMENT      Family History  Problem Relation Age of Onset  . COPD Father   . Cancer Father   . Cancer Mother   . Heart attack Brother   . Epilepsy Sister   . Hypertension Sister   . Colon cancer Neg Hx    Social History:  reports that he has been smoking cigarettes. He started smoking about 50 years ago. He has a 45.00 pack-year smoking history. He has never used smokeless tobacco. He reports current drug use. Drugs: Marijuana and Other-see comments. He reports that he does not drink alcohol.  Allergies:  Allergies  Allergen Reactions  . Fentanyl     Unknown reaction- "does not work"    Medications Prior to Admission  Medication Sig Dispense Refill  . albuterol (PROVENTIL HFA;VENTOLIN HFA) 108 (90 Base) MCG/ACT inhaler Inhale 1-2 puffs into the lungs every 6 (six) hours as needed for wheezing or shortness of breath.    . ALPRAZolam (XANAX) 1 MG tablet Take 1 mg by mouth at bedtime as needed for anxiety.    Marland Kitchen amLODipine (NORVASC) 5 MG tablet  TAKE 1 TABLET BY MOUTH ONCE DAILY. (Patient taking differently: Take 5 mg by mouth daily. ) 90 tablet 3  . aspirin EC 81 MG tablet Take 1 tablet (81 mg total) by mouth daily. 90 tablet 3  . gabapentin (NEURONTIN) 600 MG tablet Take 600 mg by mouth 3 (three) times daily.     Marland Kitchen ipratropium-albuterol (DUONEB) 0.5-2.5 (3) MG/3ML SOLN Take 3 mLs by nebulization every 6 (six) hours as needed. (Patient taking differently: Take 3 mLs by nebulization every 6 (six) hours as needed (shortness of breath). ) 360 mL 0  . linaclotide (LINZESS) 290 MCG CAPS capsule Take 1 capsule (290 mcg total) by mouth daily before breakfast. (Patient taking  differently: Take 290 mcg by mouth daily. ) 90 capsule 3  . metoprolol succinate (TOPROL-XL) 25 MG 24 hr tablet Take 2 tablets (50 mg total) by mouth every morning. & 25 mg by mouth in the evening (Patient taking differently: Take 25-50 mg by mouth See admin instructions. Take  50 mg in the morning and 25 mg in the evening) 270 tablet 3  . naproxen (NAPROSYN) 500 MG tablet TAKE (1) TABLET TWICE A DAY WITH FOOD---BREAKFAST AND SUPPER. (Patient taking differently: Take 500 mg by mouth 2 (two) times daily with a meal. ) 180 tablet 5  . oxyCODONE-acetaminophen (PERCOCET) 7.5-325 MG tablet Take 1 tablet by mouth every 4 (four) hours as needed for severe pain. 110 tablet 0  . temazepam (RESTORIL) 15 MG capsule Take 45 mg by mouth at bedtime.     Marland Kitchen tiZANidine (ZANAFLEX) 4 MG tablet TAKE 1 TABLET BEFORE BEDTIME AS NEEDED FOR MUSCLE SPASM 30 tablet 0  . nitroGLYCERIN (NITROSTAT) 0.4 MG SL tablet Place 1 tablet (0.4 mg total) under the tongue every 5 (five) minutesas needed for chest pain. 25 tablet 3    No results found for this or any previous visit (from the past 48 hour(s)). No results found.  Review of Systems  Musculoskeletal: Positive for back pain. Negative for joint swelling.  Neurological: Positive for numbness.    Blood pressure (!) 166/84, pulse 87, temperature 98.8 F (37.1 C), temperature source Oral, height 5\' 10"  (1.778 m), weight 77.1 kg, SpO2 97 %. Physical Exam  Constitutional: He is oriented to person, place, and time. He appears well-developed.  HENT:  Head: Normocephalic.  Eyes: Pupils are equal, round, and reactive to light.  Cardiovascular: Normal rate.  Respiratory: Effort normal.  GI: Soft.  Musculoskeletal:        General: Normal range of motion.     Cervical back: Normal range of motion.  Neurological: He is alert and oriented to person, place, and time. He has normal strength. GCS eye subscore is 4. GCS verbal subscore is 5. GCS motor subscore is 6.  Strength is 5-5  iliopsoas, quads, hamstrings, gastrocs, into tibialis, and EHL.  Skin: Skin is warm and dry.     Assessment/Plan 64 year old presents for decompression stabilization procedure L4-5  Mike Hamre P, MD 11/27/2019, 10:58 AM

## 2019-11-27 NOTE — Anesthesia Procedure Notes (Signed)
Procedure Name: Intubation Date/Time: 11/27/2019 11:24 AM Performed by: Adria Dill, CRNA Pre-anesthesia Checklist: Patient identified, Emergency Drugs available, Suction available and Patient being monitored Patient Re-evaluated:Patient Re-evaluated prior to induction Oxygen Delivery Method: Circle system utilized Preoxygenation: Pre-oxygenation with 100% oxygen Induction Type: IV induction Ventilation: Mask ventilation without difficulty Laryngoscope Size: Miller and 3 Grade View: Grade I Tube type: Oral Tube size: 7.5 mm Number of attempts: 1 Airway Equipment and Method: Stylet and Oral airway Placement Confirmation: ETT inserted through vocal cords under direct vision,  positive ETCO2 and breath sounds checked- equal and bilateral Secured at: 22 cm Tube secured with: Tape Dental Injury: Teeth and Oropharynx as per pre-operative assessment

## 2019-11-27 NOTE — Evaluation (Signed)
Physical Therapy Evaluation Patient Details Name: Peter Campbell MRN: [id number] DOB: 05-07-1956 Today's Date: 11/27/2019   History of Present Illness  Pt is a 64 y/o male s/p L4-5 PLIF. PMH includes COPD, HTN, essential tremor, and colon mass s/p removal.   Clinical Impression  Patient is s/p above surgery resulting in the deficits listed below (see PT Problem List). Pt requiring min A for mobility tasks using RW this session. Pt with essential tremor at baseline, however, reports it is worse today secondary to pain. Reviewed back precautions and use of RW at home to improve safety. Reports friends can check on him, but otherwise lives alone. Patient will benefit from skilled PT to increase their independence and safety with mobility (while adhering to their precautions) to allow discharge to the venue listed below.     Follow Up Recommendations Home health PT(for safety eval )    Equipment Recommendations  Rolling walker with 5" wheels;3in1 (PT)    Recommendations for Other Services       Precautions / Restrictions Precautions Precautions: Back Precaution Booklet Issued: Yes (comment) Precaution Comments: Reviewed back precatuions with pt.  Required Braces or Orthoses: Spinal Brace Spinal Brace: Lumbar corset;Applied in sitting position Restrictions Weight Bearing Restrictions: No      Mobility  Bed Mobility Overal bed mobility: Needs Assistance Bed Mobility: Rolling;Sidelying to Sit;Sit to Sidelying Rolling: Supervision Sidelying to sit: Supervision     Sit to sidelying: Supervision General bed mobility comments: Supervision for safety. Cues for log roll technique  Transfers Overall transfer level: Needs assistance Equipment used: Rolling walker (2 wheeled) Transfers: Sit to/from Stand Sit to Stand: Min assist         General transfer comment: Min A for steadying assist. Cues for safe hand placement.   Ambulation/Gait Ambulation/Gait assistance: Min assist Gait  Distance (Feet): 200 Feet Assistive device: Rolling walker (2 wheeled) Gait Pattern/deviations: Step-through pattern;Decreased stride length Gait velocity: Decreased   General Gait Details: Slow, guarded gait secondary to pain. Pt with shakiness secondary to tremor. Min A for steadying assist. Cues for sequencing using RW. Educated about using RW at home to improve safety.   Stairs            Wheelchair Mobility    Modified Rankin (Stroke Patients Only)       Balance Overall balance assessment: Needs assistance Sitting-balance support: No upper extremity supported;Feet supported Sitting balance-Leahy Scale: Good     Standing balance support: Bilateral upper extremity supported;During functional activity;No upper extremity supported Standing balance-Leahy Scale: Fair Standing balance comment: Able to stand at sink without UE support                              Pertinent Vitals/Pain Pain Assessment: Faces Faces Pain Scale: Hurts whole lot Pain Location: back Pain Descriptors / Indicators: Aching;Operative site guarding Pain Intervention(s): Limited activity within patient's tolerance;Monitored during session;Repositioned    Home Living Family/patient expects to be discharged to:: Private residence Living Arrangements: Alone Available Help at Discharge: Family;Friend(s);Available PRN/intermittently Type of Home: House Home Access: Stairs to enter Entrance Stairs-Rails: Right;Left;Can reach both Entrance Stairs-Number of Steps: 5 Home Layout: One level Home Equipment: None      Prior Function Level of Independence: Independent               Hand Dominance        Extremity/Trunk Assessment   Upper Extremity Assessment Upper Extremity Assessment: Defer to OT evaluation(tremors  noted, especially in RUE )    Lower Extremity Assessment Lower Extremity Assessment: Generalized weakness    Cervical / Trunk Assessment Cervical / Trunk  Assessment: Other exceptions Cervical / Trunk Exceptions: s/p lumbar surgery.   Communication   Communication: No difficulties  Cognition Arousal/Alertness: Awake/alert Behavior During Therapy: WFL for tasks assessed/performed Overall Cognitive Status: Within Functional Limits for tasks assessed                                        General Comments      Exercises     Assessment/Plan    PT Assessment Patient needs continued PT services  PT Problem List Decreased strength;Decreased balance;Decreased mobility;Decreased knowledge of precautions;Decreased knowledge of use of DME;Pain       PT Treatment Interventions Stair training;Gait training;DME instruction;Functional mobility training;Therapeutic activities;Therapeutic exercise;Balance training;Patient/family education    PT Goals (Current goals can be found in the Care Plan section)  Acute Rehab PT Goals Patient Stated Goal: to go home PT Goal Formulation: With patient Time For Goal Achievement: 12/11/19 Potential to Achieve Goals: Good    Frequency Min 5X/week   Barriers to discharge Decreased caregiver support      Co-evaluation               AM-PAC PT "6 Clicks" Mobility  Outcome Measure Help needed turning from your back to your side while in a flat bed without using bedrails?: None Help needed moving from lying on your back to sitting on the side of a flat bed without using bedrails?: A Little Help needed moving to and from a bed to a chair (including a wheelchair)?: A Little Help needed standing up from a chair using your arms (e.g., wheelchair or bedside chair)?: A Little Help needed to walk in hospital room?: A Little Help needed climbing 3-5 steps with a railing? : A Lot 6 Click Score: 18    End of Session Equipment Utilized During Treatment: Gait belt;Back brace Activity Tolerance: Patient tolerated treatment well Patient left: in bed;with call bell/phone within reach Nurse  Communication: Mobility status PT Visit Diagnosis: Unsteadiness on feet (R26.81);Muscle weakness (generalized) (M62.81)    Time: 5462-7035 PT Time Calculation (min) (ACUTE ONLY): 26 min   Charges:   PT Evaluation $PT Eval Low Complexity: 1 Low PT Treatments $Gait Training: 8-22 mins        Lou Miner, DPT  Acute Rehabilitation Services  Pager: [phone] Office: [phone]   Rudean Hitt 11/27/2019, 5:53 PM

## 2019-11-27 NOTE — Anesthesia Postprocedure Evaluation (Signed)
Anesthesia Post Note  Patient: Peter Campbell  Procedure(s) Performed: Posterior Lumbar Interbody Fusion - Lumbar Four-Lumbar Five (N/A Back)     Patient location during evaluation: PACU Anesthesia Type: General Level of consciousness: awake and alert Pain management: pain level controlled Vital Signs Assessment: post-procedure vital signs reviewed and stable Respiratory status: spontaneous breathing, nonlabored ventilation, respiratory function stable and patient connected to nasal cannula oxygen Cardiovascular status: blood pressure returned to baseline and stable Postop Assessment: no apparent nausea or vomiting Anesthetic complications: no    Last Vitals:  Vitals:   11/27/19 1522 11/27/19 1540  BP:  (!) 157/58  Pulse:  73  Resp:  20  Temp: 36.8 C 36.9 C  SpO2:  94%    Last Pain:  Vitals:   11/27/19 1636  TempSrc:   PainSc: 8                  Trevor Iha

## 2019-11-27 NOTE — Transfer of Care (Signed)
Immediate Anesthesia Transfer of Care Note  Patient: Peter Campbell  Procedure(s) Performed: Posterior Lumbar Interbody Fusion - Lumbar Four-Lumbar Five (N/A Back)  Patient Location: PACU  Anesthesia Type:General  Level of Consciousness: awake, drowsy and patient cooperative  Airway & Oxygen Therapy: Patient Spontanous Breathing and Patient connected to nasal cannula oxygen  Post-op Assessment: Report given to RN and Post -op Vital signs reviewed and stable  Post vital signs: Reviewed and stable  Last Vitals:  Vitals Value Taken Time  BP 130/70 11/27/19 1405  Temp 36.7 C 11/27/19 1405  Pulse 75 11/27/19 1409  Resp 28 11/27/19 1409  SpO2 100 % 11/27/19 1409  Vitals shown include unvalidated device data.  Last Pain:  Vitals:   11/27/19 1405  TempSrc:   PainSc: (P) Asleep      Patients Stated Pain Goal: 2 (11/27/19 1020)  Complications: No apparent anesthesia complications

## 2019-11-27 NOTE — Op Note (Signed)
Preoperative diagnosis: L4-5 lumbar spinal stenosis grade 1 spondylolisthesis with instability bilateral L4-L5 radiculopathies right greater than left  Postoperative diagnosis: Same  Procedure: Decompressive lumbar laminectomy L4-5 in excess and requiring more work than would be needed with a standard interbody fusion with complete medial facetectomies and radical foraminotomies of the L4 and L5 nerve roots  2.  Posterior lumbar interbody fusion utilizing the globus insert and rotate titanium cages packed with the locally harvested autograft mixed with the Angel B MAC  3.  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  Due to patient's failure of conservative treatment imaging findings and progression of clinical syndrome I recommended decompression stabilization procedure at L4-5.  I extensively went over the risks and benefits of that operation with him as well as perioperative course expectations of outcome and alternatives of surgery and he understood and agreed to proceed forward.  Operative procedure: Patient brought into the OR was due to general anesthesia positioned prone the Wilson frame his back was prepped and draped in routine sterile fashion.  His old incision was identified and I extended it superiorly subperiosteal dissection was carried out on the lamina of L4 and L5 confirmed by intraoperative x-ray.  So then bilateral laminotomies were performed with complete facetectomies and radical foraminotomies of the L4 and L5 nerve root.  There was marked hourglass compression of thecal sac bilaterally this was all teased off the dura on the right side  there was a synovial cyst displacing the thecal sac centrally this was all teased off the scar tissue was freed up and after removal with complete facetectomies both the L4 and L5 nerve roots bilaterally were unroofed and radically radically decompressed.  Then then the epidural veins were identified and coagulated into space and sized to space was cleaned out bilaterally endplates were prepared bilaterally utilizing sequential distraction with an 11 distractor in place I selected 10 mm wide 15 degree lordosis implants 26 mm in depth and placed those bilaterally after they were packed with locally harvested autograft mixed with an extensive mount of autograft mixed packed centrally centrally.  After all the implants been placed and under fluoroscopy attention taken a cortical placement.  Bilateral L4-L5 cortical screws were placed under fluoroscopy all screws had excellent purchase.  Then the posterior fluoroscopy confirmed the position of the implants the head were assembled screws were advanced rods were placed and the L4 screw was compressed against L5 prior to tightening after all the construct been completed the foramina reinspected to confirm patency and no migration of graft material Gelfoam was ON top of dura medium Hemovac drain was placed Exparel was injected the fascia and the wound was closed in layers with interrupted Vicryl running 4 subcuticular Dermabond benzoin Steri-Strips and a sterile dressing was applied and patient recovery in stable condition.  At the end the case all needle count sponge counts were correct.

## 2019-11-28 DIAGNOSIS — M48061 Spinal stenosis, lumbar region without neurogenic claudication: Secondary | ICD-10-CM | POA: Diagnosis not present

## 2019-11-28 MED ORDER — METHOCARBAMOL 500 MG PO TABS: 500.0000 mg | ORAL_TABLET | Freq: Four times a day (QID) | ORAL | 0 refills | Status: DC

## 2019-11-28 MED ORDER — OXYCODONE-ACETAMINOPHEN 10-325 MG PO TABS: 1.0000 | ORAL_TABLET | Freq: Four times a day (QID) | ORAL | 0 refills | Status: DC | PRN

## 2019-11-28 NOTE — Plan of Care (Signed)
Pt doing well. Pt given D/C instructions with verbal understanding. Rx's were sent to Pt's pharmacy by MD. Pt's incision is clean and dry with no sign of infection. Pt's IV and Hemovac were removed prior to D/C. Pt's Home Health was arranged by MD. Leisa Lenz and 3-n-1 were given to Pt from [facility] per MD order. Pt D/C'd home via wheelchair per MD order. Pt is stable @ D/C and has no other needs at this time. Rema Fendt, RN

## 2019-11-28 NOTE — Care Management Obs Status (Signed)
MEDICARE OBSERVATION STATUS NOTIFICATION   Patient Details  Name: Peter Campbell MRN: [id number] Date of Birth: 05-13-1956   Medicare Observation Status Notification Given:  Yes    Lawerance Sabal, RN 11/28/2019, 9:46 AM

## 2019-11-28 NOTE — Care Management CC44 (Signed)
Condition Code 44 Documentation Completed  Patient Details  Name: SONNIE PAWLOSKI MRN: [id number] Date of Birth: 23-Oct-1955   Condition Code 44 given:  Yes Patient signature on Condition Code 44 notice:  Yes Documentation of 2 MD's agreement:  Yes Code 44 added to claim:  Yes    Lawerance Sabal, RN 11/28/2019, 9:46 AM

## 2019-11-28 NOTE — Progress Notes (Signed)
Physical Therapy Treatment Patient Details Name: Peter Campbell MRN: [id number] DOB: [birth year] Today's Date: 11/28/2019    History of Present Illness Pt is a 64 y/o male s/p L4-5 PLIF. PMH includes COPD, HTN, essential tremor, and colon mass s/p removal.     PT Comments    Pt progressing towards goals. Pt able to amb and complete stair negotiation with min guard. Pt c/o 7/10 back pain. Pt with good home set up and support. Pt safe to d/c home when medically stable. Acute PT to cont to follow.    Follow Up Recommendations  Home health PT     Equipment Recommendations  Rolling walker with 5" wheels;3in1 (PT)    Recommendations for Other Services       Precautions / Restrictions Precautions Precautions: Back Precaution Booklet Issued: Yes (comment) Precaution Comments: Reviewed back precatuions with pt.  Required Braces or Orthoses: Spinal Brace Spinal Brace: Lumbar corset;Applied in sitting position Restrictions Weight Bearing Restrictions: No    Mobility  Bed Mobility Overal bed mobility: Needs Assistance Bed Mobility: Sidelying to Sit   Sidelying to sit: Min guard       General bed mobility comments: pt received up in chair upon PT arrival  Transfers Overall transfer level: Needs assistance Equipment used: Rolling walker (2 wheeled) Transfers: Sit to/from Stand Sit to Stand: Min guard         General transfer comment: increased time, verbal cues for hand placement, minimal trunk flexion  Ambulation/Gait Ambulation/Gait assistance: Min guard Gait Distance (Feet): 200 Feet Assistive device: Rolling walker (2 wheeled) Gait Pattern/deviations: Step-through pattern;Decreased stride length Gait velocity: Decreased   General Gait Details: Slow, guarded gait secondary to pain. Pt with shakiness secondary to tremor. Min A for steadying assist. Cues for sequencing using RW. Educated about using RW at home to improve safety.    Stairs Stairs: Yes Stairs  assistance: Min guard Stair Management: One rail Right;Step to pattern;Backwards Number of Stairs: 5 General stair comments: pt with good technique, no physical assist, verbal cues for technique   Wheelchair Mobility    Modified Rankin (Stroke Patients Only)       Balance Overall balance assessment: Needs assistance Sitting-balance support: No upper extremity supported;Feet supported Sitting balance-Leahy Scale: Good     Standing balance support: Bilateral upper extremity supported;During functional activity Standing balance-Leahy Scale: Fair Standing balance comment: needs RW for amb                            Cognition Arousal/Alertness: Awake/alert Behavior During Therapy: WFL for tasks assessed/performed Overall Cognitive Status: Within Functional Limits for tasks assessed                                        Exercises      General Comments General comments (skin integrity, edema, etc.): incision not observed, VSS      Pertinent Vitals/Pain Pain Assessment: 0-10 Pain Score: 7  Faces Pain Scale: Hurts even more Pain Location: back Pain Descriptors / Indicators: Operative site guarding Pain Intervention(s): Limited activity within patient's tolerance    Home Living Family/patient expects to be discharged to:: Private residence Living Arrangements: Alone Available Help at Discharge: Family;Friend(s);Available PRN/intermittently Type of Home: House Home Access: Stairs to enter Entrance Stairs-Rails: Right;Left;Can reach both Home Layout: One level Home Equipment: None      Prior Function Level  of Independence: Independent          PT Goals (current goals can now be found in the care plan section) Acute Rehab PT Goals Patient Stated Goal: go home today Progress towards PT goals: Progressing toward goals    Frequency    Min 5X/week      PT Plan Current plan remains appropriate    Co-evaluation               AM-PAC PT "6 Clicks" Mobility   Outcome Measure  Help needed turning from your back to your side while in a flat bed without using bedrails?: None Help needed moving from lying on your back to sitting on the side of a flat bed without using bedrails?: None Help needed moving to and from a bed to a chair (including a wheelchair)?: A Little Help needed standing up from a chair using your arms (e.g., wheelchair or bedside chair)?: A Little Help needed to walk in hospital room?: A Little Help needed climbing 3-5 steps with a railing? : A Little 6 Click Score: 20    End of Session Equipment Utilized During Treatment: Gait belt;Back brace Activity Tolerance: Patient tolerated treatment well Patient left: with call bell/phone within reach;in chair Nurse Communication: Mobility status PT Visit Diagnosis: Unsteadiness on feet (R26.81);Muscle weakness (generalized) (M62.81)     Time: 2202-5427 PT Time Calculation (min) (ACUTE ONLY): 29 min  Charges:  $Gait Training: 23-37 mins                     Kittie Plater, PT, DPT Acute Rehabilitation Services Pager #: [phone] Office #: [phone]    Berline Lopes 11/28/2019, 9:14 AM

## 2019-11-28 NOTE — TOC Transition Note (Signed)
Transition of Care [facility]) - CM/SW Discharge Note   Patient Details  Name: Peter Campbell MRN: [id number] Date of Birth: [birth year]  [facility] of [facility]) CM/SW Contact:  Lawerance Sabal, RN Phone Number: 11/28/2019, 9:47 AM   Clinical Narrative:    Spoke to patient at the bedside. He is agreeable to [facility] services. He would like [facility], referral accepted by Tiffany. DME to be provided by unit staff. Son to provide transport home    Final next level of care: Home w Home Health Services Barriers to Discharge: No Barriers Identified   Patient Goals and CMS Choice Patient states their goals for this hospitalization and ongoing recovery are:: to go home CMS Medicare.gov Compare Post Acute Care list provided to:: Patient Choice offered to / list presented to : Patient  Discharge Placement                       Discharge Plan and Services                          HH Arranged: PT [facility] Agency: [facility] Date HH Agency Contacted: 11/28/19 Time HH Agency Contacted: [phone] Representative spoke with at [facility] Agency: Tiffany  Social Determinants of Health (SDOH) Interventions     Readmission Risk Interventions No flowsheet data found.

## 2019-11-28 NOTE — Discharge Instructions (Signed)

## 2019-11-28 NOTE — Evaluation (Signed)
Occupational Therapy Evaluation Patient Details Name: Peter Campbell MRN: [id number] DOB: 17-Jul-1956 Today's Date: 11/28/2019    History of Present Illness Pt is a 64 y/o male s/p L4-5 PLIF. PMH includes COPD, HTN, essential tremor, and colon mass s/p removal.    Clinical Impression   This 64 y/o male presents with the above. PTA pt living alone and reports independent with ADL, iADL and functional mobility. Pt currently requiring minA for sit<>stand, completing room level functional mobility using RW at minguard-supervision level. Pt currently requiring minguard-minA for LB ADL, supervision for seated UB ADL. Educated and reviewed with pt re: back precautions, brace management, AE, safety and compensatory techniques for completing ADL and functional transfers with pt verbalizing and return demonstrating understanding throughout. Pt requiring intermittent cues for maintaining precautions. Pt reports plans to return home with intermittent assist from friends/family PRN. He will benefit from continued acute OT services and currently recommend follow up [facility] services after discharge to maximize his safety and independence with ADL and mobility. Will follow.     Follow Up Recommendations  Home health OT;Supervision - Intermittent    Equipment Recommendations  3 in 1 bedside commode           Precautions / Restrictions Precautions Precautions: Back Precaution Booklet Issued: Yes (comment) Precaution Comments: Reviewed back precatuions with pt.  Required Braces or Orthoses: Spinal Brace Spinal Brace: Lumbar corset;Applied in sitting position Restrictions Weight Bearing Restrictions: No      Mobility Bed Mobility Overal bed mobility: Needs Assistance Bed Mobility: Sidelying to Sit   Sidelying to sit: Min guard       General bed mobility comments: minguard for safety, increased time and effort from pt but able to perform from flat bed, min cues for technique   Transfers Overall  transfer level: Needs assistance Equipment used: Rolling walker (2 wheeled) Transfers: Sit to/from Stand Sit to Stand: Min assist         General transfer comment: Min A for boosting. Cues for safe hand placement.     Balance Overall balance assessment: Needs assistance Sitting-balance support: No upper extremity supported;Feet supported Sitting balance-Leahy Scale: Good     Standing balance support: Bilateral upper extremity supported;During functional activity;No upper extremity supported Standing balance-Leahy Scale: Fair                             ADL either performed or assessed with clinical judgement   ADL Overall ADL's : Needs assistance/impaired Eating/Feeding: Modified independent;Sitting   Grooming: Set up;Sitting   Upper Body Bathing: Set up;Sitting   Lower Body Bathing: Minimal assistance;Sit to/from stand;Sitting/lateral leans;With adaptive equipment   Upper Body Dressing : Set up;Supervision/safety;Sitting Upper Body Dressing Details (indicate cue type and reason): donning and managing lumbar brace Lower Body Dressing: Minimal assistance;Sit to/from stand Lower Body Dressing Details (indicate cue type and reason): minA For sit<>Stand, educated in use of reacher for donning pants with pt return demonstrating understanding, pt also able to utilize figure 4 technique Toilet Transfer: Min guard;Ambulation;RW Toilet Transfer Details (indicate cue type and reason): simulated via transfer to recliner Toileting- Clothing Manipulation and Hygiene: Min guard;Sit to/from stand;Minimal assistance     Tub/Shower Transfer Details (indicate cue type and reason): verbally reviewed safe transfer techniques, educated on use of 3:1 as shower seat and to have friend/family transition 3:1 to shower from over commode when wanting to use to avoid having him break any precautions, also educated on option of sponge  bathing vs use of shower pending pt comfort level with  stepping over tub ledge Functional mobility during ADLs: Min guard;Minimal assistance;Rolling walker General ADL Comments: pt with pain, weakness; educated pt in use of AE for increasing safety and independence with ADL tasks                         Pertinent Vitals/Pain Pain Assessment: Faces Faces Pain Scale: Hurts even more Pain Location: back Pain Descriptors / Indicators: Aching;Operative site guarding Pain Intervention(s): Limited activity within patient's tolerance;Monitored during session;Repositioned     Hand Dominance     Extremity/Trunk Assessment Upper Extremity Assessment Upper Extremity Assessment: (essential tremors bil UEs, pt reports baseline)   Lower Extremity Assessment Lower Extremity Assessment: Defer to PT evaluation   Cervical / Trunk Assessment Cervical / Trunk Assessment: Other exceptions Cervical / Trunk Exceptions: s/p lumbar surgery.    Communication Communication Communication: No difficulties   Cognition Arousal/Alertness: Awake/alert Behavior During Therapy: WFL for tasks assessed/performed Overall Cognitive Status: Within Functional Limits for tasks assessed                                     General Comments       Exercises     Shoulder Instructions      Home Living Family/patient expects to be discharged to:: Private residence Living Arrangements: Alone Available Help at Discharge: Family;Friend(s);Available PRN/intermittently Type of Home: House Home Access: Stairs to enter [employer] of Steps: 5 Entrance Stairs-Rails: Right;Left;Can reach both Home Layout: One level     Bathroom Shower/Tub: Teacher, early years/pre: Standard     Home Equipment: None          Prior Functioning/Environment Level of Independence: Independent                 OT Problem List: Decreased strength;Decreased activity tolerance;Impaired balance (sitting and/or standing);Decreased knowledge  of use of DME or AE;Pain;Decreased knowledge of precautions      OT Treatment/Interventions: Self-care/ADL training;Therapeutic exercise;DME and/or AE instruction;Patient/family education;Balance training    OT Goals(Current goals can be found in the care plan section) Acute Rehab OT Goals Patient Stated Goal: to go home OT Goal Formulation: With patient Time For Goal Achievement: 12/12/19 Potential to Achieve Goals: Good  OT Frequency: Min 2X/week   Barriers to D/C:            Co-evaluation              AM-PAC OT "6 Clicks" Daily Activity     Outcome Measure Help from another person eating meals?: None Help from another person taking care of personal grooming?: None Help from another person toileting, which includes using toliet, bedpan, or urinal?: None Help from another person bathing (including washing, rinsing, drying)?: A Little Help from another person to put on and taking off regular upper body clothing?: None Help from another person to put on and taking off regular lower body clothing?: A Little 6 Click Score: 22   End of Session Equipment Utilized During Treatment: Rolling walker;Back brace Nurse Communication: Mobility status  Activity Tolerance: Patient tolerated treatment well Patient left: in chair;with call bell/phone within reach  OT Visit Diagnosis: Other abnormalities of gait and mobility (R26.89);Pain Pain - part of body: (back)                Time: 3557-3220 OT Time Calculation (min):  36 min Charges:  OT General Charges $OT Visit: 1 Visit OT Evaluation $OT Eval Low Complexity: 1 Low OT Treatments $Self Care/Home Management : 8-22 mins  Marcy Siren, OT Supplemental Rehabilitation Services Pager [phone] Office [phone]   Orlando Penner 11/28/2019, 9:04 AM

## 2019-11-28 NOTE — Discharge Summary (Signed)
Physician Discharge Summary  Patient ID: Peter Campbell MRN: [id number] DOB/AGE: 1956/05/19 64 y.o.  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 The patient tolerated the procedure well and was taken to the recovery room and then to the floor in stable condition. The hospital course was routine. There were no complications. The wound remained clean dry and intact. Pt had appropriate back soreness. No complaints of leg pain or new N/T/W. The patient remained afebrile with stable vital signs, and tolerated a regular diet. The patient continued to increase activities, and pain was well controlled with oral pain medications.   Consults: None  Significant Diagnostic Studies:  Results for orders placed or performed during the hospital encounter of 11/24/19  Surgical pcr screen   Specimen: Nasal Mucosa; Nasal Swab  Result Value Ref Range   MRSA, PCR NEGATIVE NEGATIVE   Staphylococcus aureus NEGATIVE NEGATIVE  Basic metabolic panel  Result Value Ref Range   Sodium 139 135 - 145 mmol/L   Potassium 4.3 3.5 - 5.1 mmol/L   Chloride 102 98 - 111 mmol/L   CO2 28 22 - 32 mmol/L   Glucose, Bld 105 (H) 70 - 99 mg/dL   BUN 25 (H) 8 - 23 mg/dL   Creatinine, Ser 0.77 0.61 - 1.24 mg/dL   Calcium 9.2 8.9 - 10.3 mg/dL   GFR calc non Af Amer >60 >60 mL/min   GFR calc Af Amer >60 >60 mL/min   Anion gap 9 5 - 15  CBC  Result Value Ref Range   WBC 9.2 4.0 - 10.5 K/uL   RBC 5.26 4.22 - 5.81 MIL/uL   Hemoglobin 17.1 (H) 13.0 - 17.0 g/dL   HCT 52.4 (H) 39.0 - 52.0 %   MCV 99.6 80.0 - 100.0 fL   MCH 32.5 26.0 - 34.0 pg   MCHC 32.6 30.0 - 36.0 g/dL   RDW 13.0 11.5 - 15.5 %    Platelets 226 150 - 400 K/uL   nRBC 0.0 0.0 - 0.2 %  Type and screen All Cardiac and thoracic surgeries, spinal fusions, myomectomies, craniotomies, colon & liver resections, total joint revisions, same day c-section with placenta previa or accreta.  Result Value Ref Range   ABO/RH(D) O POS    Antibody Screen NEG    Sample Expiration 12/08/2019,2359    Extend sample reason      NO TRANSFUSIONS OR PREGNANCY IN THE PAST 3 MONTHS Performed at [facility], [address] [place], [place] [address]   ABO/Rh  Result Value Ref Range   ABO/RH(D)      O POS Performed at [facility] [address] [place], [place] [address]     DG Lumbar Spine 2-3 Views  Result Date: 11/27/2019 CLINICAL DATA:  PLIF L4-5 EXAM: LUMBAR SPINE - 2-3 VIEW; DG C-ARM 1-60 MIN COMPARISON:  11/09/2019 lumbar spine radiographs FLUOROSCOPY TIME:  Fluoroscopy Time:  0 minutes 48 seconds Number of Acquired Spot Images: 2 FINDINGS: Nondiagnostic spot fluoroscopic intraoperative AP and lateral lumbar spine radiographs demonstrate bilateral pedicle screws at L4 and L5 with bone cage in the L4-5 disc space. IMPRESSION: Intraoperative fluoroscopic guidance for L4-5 PLIF. Electronically Signed   By: Janina Mayo.D.  On: 11/27/2019 13:55   DG C-Arm 1-60 Min  Result Date: 11/27/2019 CLINICAL DATA:  PLIF L4-5 EXAM: LUMBAR SPINE - 2-3 VIEW; DG C-ARM 1-60 MIN COMPARISON:  11/09/2019 lumbar spine radiographs FLUOROSCOPY TIME:  Fluoroscopy Time:  0 minutes 48 seconds Number of Acquired Spot Images: 2 FINDINGS: Nondiagnostic spot fluoroscopic intraoperative AP and lateral lumbar spine radiographs demonstrate bilateral pedicle screws at L4 and L5 with bone cage in the L4-5 disc space. IMPRESSION: Intraoperative fluoroscopic guidance for L4-5 PLIF. Electronically Signed   By: Delbert Phenix M.D.   On: 11/27/2019 13:55    Antibiotics:  Anti-infectives (From admission, onward)   Start     Dose/Rate Route Frequency Ordered Stop    11/27/19 1930  ceFAZolin (ANCEF) IVPB 2g/100 mL premix     2 g 200 mL/hr over 30 Minutes Intravenous Every 8 hours 11/27/19 1528 11/29/19 1929   11/27/19 1151  bacitracin 50,000 Units in sodium chloride 0.9 % 500 mL irrigation  Status:  Discontinued       As needed 11/27/19 1152 11/27/19 1402   11/27/19 1015  ceFAZolin (ANCEF) IVPB 2g/100 mL premix     2 g 200 mL/hr over 30 Minutes Intravenous On call to O.R. 11/27/19 1006 11/27/19 1156   11/27/19 1011  ceFAZolin (ANCEF) 2-4 GM/100ML-% IVPB    Note to Pharmacy: Ernie Avena   : cabinet override      11/27/19 1011 11/27/19 1136      Discharge Exam: Blood pressure (!) 107/49, pulse 79, temperature 98.5 F (36.9 C), temperature source Oral, resp. rate 16, height 5\' 10"  (1.778 m), weight 77.1 kg, SpO2 94 %. Neurologic: Grossly normal Ambulating and voiding well, incision cdi  Discharge Medications:   Allergies as of 11/28/2019      Reactions   Fentanyl    Unknown reaction- "does not work"      Medication List    STOP taking these medications   oxyCODONE-acetaminophen 7.5-325 MG tablet Commonly known as: PERCOCET Replaced by: oxyCODONE-acetaminophen 10-325 MG tablet     TAKE these medications   albuterol 108 (90 Base) MCG/ACT inhaler Commonly known as: VENTOLIN HFA Inhale 1-2 puffs into the lungs every 6 (six) hours as needed for wheezing or shortness of breath.   ALPRAZolam 1 MG tablet Commonly known as: XANAX Take 1 mg by mouth at bedtime as needed for anxiety.   amLODipine 5 MG tablet Commonly known as: NORVASC TAKE 1 TABLET BY MOUTH ONCE DAILY.   aspirin EC 81 MG tablet Take 1 tablet (81 mg total) by mouth daily.   gabapentin 600 MG tablet Commonly known as: NEURONTIN Take 600 mg by mouth 3 (three) times daily.   ipratropium-albuterol 0.5-2.5 (3) MG/3ML Soln Commonly known as: DUONEB Take 3 mLs by nebulization every 6 (six) hours as needed. What changed: reasons to take this   linaclotide 290 MCG Caps  capsule Commonly known as: Linzess Take 1 capsule (290 mcg total) by mouth daily before breakfast. What changed: when to take this   methocarbamol 500 MG tablet Commonly known as: Robaxin Take 1 tablet (500 mg total) by mouth 4 (four) times daily.   metoprolol succinate 25 MG 24 hr tablet Commonly known as: TOPROL-XL Take 2 tablets (50 mg total) by mouth every morning. & 25 mg by mouth in the evening What changed:   how much to take  when to take this  additional instructions   naproxen 500 MG tablet Commonly known as: NAPROSYN TAKE (1) TABLET TWICE A DAY WITH  FOOD---BREAKFAST AND SUPPER. What changed:   how much to take  how to take this  when to take this  additional instructions   nitroGLYCERIN 0.4 MG SL tablet Commonly known as: NITROSTAT Place 1 tablet (0.4 mg total) under the tongue every 5 (five) minutesas needed for chest pain.   oxyCODONE-acetaminophen 10-325 MG tablet Commonly known as: Percocet Take 1 tablet by mouth every 6 (six) hours as needed for pain. Replaces: oxyCODONE-acetaminophen 7.5-325 MG tablet   temazepam 15 MG capsule Commonly known as: RESTORIL Take 45 mg by mouth at bedtime.   tiZANidine 4 MG tablet Commonly known as: ZANAFLEX TAKE 1 TABLET BEFORE BEDTIME AS NEEDED FOR MUSCLE SPASM            Durable Medical Equipment  (From admission, onward)         Start     Ordered   11/28/19 0815  DME Dan Humphreys  Once    Question Answer Comment  Walker: With 5 Inch Wheels   Patient needs a walker to treat with the following condition S/P lumbar fusion      11/28/19 0815   11/28/19 0814  DME 3-in-1  Once     11/28/19 0815          Disposition: home   Final Dx: PLIF L4-5  Discharge Instructions     Remove dressing in 72 hours   Complete by: As directed    Call MD for:  difficulty breathing, headache or visual disturbances   Complete by: As directed    Call MD for:  hives   Complete by: As directed    Call MD for:   persistant dizziness or light-headedness   Complete by: As directed    Call MD for:  persistant nausea and vomiting   Complete by: As directed    Call MD for:  redness, tenderness, or signs of infection (pain, swelling, redness, odor or green/yellow discharge around incision site)   Complete by: As directed    Call MD for:  severe uncontrolled pain   Complete by: As directed    Call MD for:  temperature >100.4   Complete by: As directed    Diet - low sodium heart healthy   Complete by: As directed    Driving Restrictions   Complete by: As directed    No driving for 2 weeks, no riding in the car for 1 week   Increase activity slowly   Complete by: As directed    Lifting restrictions   Complete by: As directed    No lifting more than 8 lbs         Signed: Tiana Loft Shannette Tabares 11/28/2019, 8:15 AM

## 2019-12-07 ENCOUNTER — Other Ambulatory Visit (HOSPITAL_COMMUNITY): Payer: Self-pay | Admitting: Neurosurgery

## 2019-12-07 ENCOUNTER — Other Ambulatory Visit: Payer: Self-pay | Admitting: Neurosurgery

## 2019-12-07 DIAGNOSIS — M4316 Spondylolisthesis, lumbar region: Secondary | ICD-10-CM

## 2019-12-11 ENCOUNTER — Ambulatory Visit (HOSPITAL_COMMUNITY)
Admission: RE | Admit: 2019-12-11 | Discharge: 2019-12-11 | Disposition: A | Discharge status: Home / Self Care | Payer: Medicare Other | Source: Ambulatory Visit | Attending: Neurosurgery | Admitting: Neurosurgery

## 2019-12-11 ENCOUNTER — Other Ambulatory Visit: Payer: Self-pay

## 2019-12-11 DIAGNOSIS — M4316 Spondylolisthesis, lumbar region: Secondary | ICD-10-CM | POA: Diagnosis present

## 2019-12-11 MED ORDER — GADOBUTROL 1 MMOL/ML IV SOLN
7.5000 mL | Freq: Once | INTRAVENOUS | Status: AC | PRN
  Administered 2019-12-11: 7.5 mL via INTRAVENOUS

## 2019-12-12 ENCOUNTER — Ambulatory Visit: Payer: Medicare Other | Admitting: Orthopaedic Surgery

## 2019-12-26 ENCOUNTER — Ambulatory Visit (INDEPENDENT_AMBULATORY_CARE_PROVIDER_SITE_OTHER): Payer: Medicare Other | Admitting: Orthopaedic Surgery

## 2019-12-26 ENCOUNTER — Encounter: Payer: Self-pay | Admitting: Orthopaedic Surgery

## 2019-12-26 ENCOUNTER — Other Ambulatory Visit: Payer: Self-pay

## 2019-12-26 DIAGNOSIS — G8929 Other chronic pain: Secondary | ICD-10-CM

## 2019-12-26 DIAGNOSIS — F17210 Nicotine dependence, cigarettes, uncomplicated: Secondary | ICD-10-CM | POA: Diagnosis not present

## 2019-12-26 DIAGNOSIS — M25511 Pain in right shoulder: Secondary | ICD-10-CM

## 2019-12-26 DIAGNOSIS — M5410 Radiculopathy, site unspecified: Secondary | ICD-10-CM | POA: Diagnosis not present

## 2019-12-26 MED ORDER — OXYCODONE-ACETAMINOPHEN 7.5-325 MG PO TABS: 1.0000 | ORAL_TABLET | Freq: Four times a day (QID) | ORAL | 0 refills | Status: AC | PRN

## 2019-12-26 NOTE — Patient Instructions (Signed)
Steps to Quit Smoking Smoking tobacco is the leading cause of preventable death. It can affect almost every organ in the body. Smoking puts you and people around you at risk for many serious, long-lasting (chronic) diseases. Quitting smoking can be hard, but it is one of the best things that you can do for your health. It is never too late to quit. How do I get ready to quit? When you decide to quit smoking, make a plan to help you succeed. Before you quit:  Pick a date to quit. Set a date within the next 2 weeks to give you time to prepare.  Write down the reasons why you are quitting. Keep this list in places where you will see it often.  Tell your family, friends, and co-workers that you are quitting. Their support is important.  Talk with your doctor about the choices that may help you quit.  Find out if your health insurance will pay for these treatments.  Know the people, places, things, and activities that make you want to smoke (triggers). Avoid them. What first steps can I take to quit smoking?  Throw away all cigarettes at home, at work, and in your car.  Throw away the things that you use when you smoke, such as ashtrays and lighters.  Clean your car. Make sure to empty the ashtray.  Clean your home, including curtains and carpets. What can I do to help me quit smoking? Talk with your doctor about taking medicines and seeing a counselor at the same time. You are more likely to succeed when you do both.  If you are pregnant or breastfeeding, talk with your doctor about counseling or other ways to quit smoking. Do not take medicine to help you quit smoking unless your doctor tells you to do so. To quit smoking: Quit right away  Quit smoking totally, instead of slowly cutting back on how much you smoke over a period of time.  Go to counseling. You are more likely to quit if you go to counseling sessions regularly. Take medicine You may take medicines to help you quit. Some  medicines need a prescription, and some you can buy over-the-counter. Some medicines may contain a drug called nicotine to replace the nicotine in cigarettes. Medicines may:  Help you to stop having the desire to smoke (cravings).  Help to stop the problems that come when you stop smoking (withdrawal symptoms). Your doctor may ask you to use:  Nicotine patches, gum, or lozenges.  Nicotine inhalers or sprays.  Non-nicotine medicine that is taken by mouth. Find resources Find resources and other ways to help you quit smoking and remain smoke-free after you quit. These resources are most helpful when you use them often. They include:  Online chats with a counselor.  Phone quitlines.  Printed self-help materials.  Support groups or group counseling.  Text messaging programs.  Mobile phone apps. Use apps on your mobile phone or tablet that can help you stick to your quit plan. There are many free apps for mobile phones and tablets as well as websites. Examples include Quit Guide from the CDC and smokefree.gov  What things can I do to make it easier to quit?   Talk to your family and friends. Ask them to support and encourage you.  Call a phone quitline (1-800-QUIT-NOW), reach out to support groups, or work with a counselor.  Ask people who smoke to not smoke around you.  Avoid places that make you want to smoke,   such as: ? Bars. ? Parties. ? Smoke-break areas at work.  Spend time with people who do not smoke.  Lower the stress in your life. Stress can make you want to smoke. Try these things to help your stress: ? Getting regular exercise. ? Doing deep-breathing exercises. ? Doing yoga. ? Meditating. ? Doing a body scan. To do this, close your eyes, focus on one area of your body at a time from head to toe. Notice which parts of your body are tense. Try to relax the muscles in those areas. How will I feel when I quit smoking? Day 1 to 3 weeks Within the first 24 hours,  you may start to have some problems that come from quitting tobacco. These problems are very bad 2-3 days after you quit, but they do not often last for more than 2-3 weeks. You may get these symptoms:  Mood swings.  Feeling restless, nervous, angry, or annoyed.  Trouble concentrating.  Dizziness.  Strong desire for high-sugar foods and nicotine.  Weight gain.  Trouble pooping (constipation).  Feeling like you may vomit (nausea).  Coughing or a sore throat.  Changes in how the medicines that you take for other issues work in your body.  Depression.  Trouble sleeping (insomnia). Week 3 and afterward After the first 2-3 weeks of quitting, you may start to notice more positive results, such as:  Better sense of smell and taste.  Less coughing and sore throat.  Slower heart rate.  Lower blood pressure.  Clearer skin.  Better breathing.  Fewer sick days. Quitting smoking can be hard. Do not give up if you fail the first time. Some people need to try a few times before they succeed. Do your best to stick to your quit plan, and talk with your doctor if you have any questions or concerns. Summary  Smoking tobacco is the leading cause of preventable death. Quitting smoking can be hard, but it is one of the best things that you can do for your health.  When you decide to quit smoking, make a plan to help you succeed.  Quit smoking right away, not slowly over a period of time.  When you start quitting, seek help from your doctor, family, or friends. This information is not intended to replace advice given to you by your health care provider. Make sure you discuss any questions you have with your health care provider. Document Revised: 06/09/2019 Document Reviewed: 12/03/2018 Elsevier Patient Education  2020 Elsevier Inc.  

## 2019-12-26 NOTE — Progress Notes (Signed)
PROCEDURE NOTE:  The patient request injection, verbal consent was obtained.  The right shoulder was prepped appropriately after time out was performed.   Sterile technique was observed and injection of 1 cc of Depo-Medrol 40 mg with several cc's of plain xylocaine. Anesthesia was provided by ethyl chloride and a 20-gauge needle was used to inject the shoulder area. A posterior approach was used.  The injection was tolerated well.  A band aid dressing was applied.  The patient was advised to apply ice later today and tomorrow to the injection sight as needed.  He had recent back surgery.  Return in one month.  I have reviewed the [place] Controlled Substance Reporting System web site prior to prescribing narcotic medicine for this patient.   Electronically Signed Darreld Mclean, MD 3/30/20211:37 PM

## 2020-01-04 ENCOUNTER — Encounter: Payer: Self-pay | Admitting: Orthopaedic Surgery

## 2020-01-04 ENCOUNTER — Other Ambulatory Visit: Payer: Self-pay

## 2020-01-04 ENCOUNTER — Ambulatory Visit (INDEPENDENT_AMBULATORY_CARE_PROVIDER_SITE_OTHER): Payer: Medicare Other | Admitting: Orthopaedic Surgery

## 2020-01-04 DIAGNOSIS — M5410 Radiculopathy, site unspecified: Secondary | ICD-10-CM

## 2020-01-04 DIAGNOSIS — M25511 Pain in right shoulder: Secondary | ICD-10-CM | POA: Diagnosis not present

## 2020-01-04 DIAGNOSIS — M79651 Pain in right thigh: Secondary | ICD-10-CM

## 2020-01-04 DIAGNOSIS — F17210 Nicotine dependence, cigarettes, uncomplicated: Secondary | ICD-10-CM

## 2020-01-04 DIAGNOSIS — M545 Low back pain: Secondary | ICD-10-CM

## 2020-01-04 DIAGNOSIS — M25512 Pain in left shoulder: Secondary | ICD-10-CM

## 2020-01-04 DIAGNOSIS — M25551 Pain in right hip: Secondary | ICD-10-CM

## 2020-01-04 DIAGNOSIS — G8929 Other chronic pain: Secondary | ICD-10-CM | POA: Diagnosis not present

## 2020-01-04 NOTE — Progress Notes (Signed)
Virtual Visit via Telephone Note  I connected with@ on 01/04/20 at 10:30 AM EDT by telephone and verified that I am speaking with the correct person using two identifiers.  Location: Patient: home Provider: [facility]   I discussed the limitations, risks, security and privacy concerns of performing an evaluation and management service by telephone and the availability of in person appointments. I also discussed with the patient that there may be a patient responsible charge related to this service. The patient expressed understanding and agreed to proceed.   History of Present Illness: He has had increasing pain of both shoulders, his lower back and his right thigh and hip with referred pain from the back.  He is interested in going to a pain clinic as the pain medicine does not work.  I will arrange for a pain clinic.  He will keep his regular appointment.   Observations/Objective: Per above.  Assessment and Plan: Encounter Diagnoses  Name Primary?  . Chronic right shoulder pain Yes  . Cigarette nicotine dependence without complication   . Back pain with left-sided radiculopathy      Follow Up Instructions: Keep regular appointment   I discussed the assessment and treatment plan with the patient. The patient was provided an opportunity to ask questions and all were answered. The patient agreed with the plan and demonstrated an understanding of the instructions.   The patient was advised to call back or seek an in-person evaluation if the symptoms worsen or if the condition fails to improve as anticipated.  I provided 8 minutes of non-face-to-face time during this encounter.   Darreld Mclean, MD

## 2020-01-12 ENCOUNTER — Telehealth: Payer: Self-pay

## 2020-01-12 NOTE — Telephone Encounter (Signed)
Received fax from [facility] that patient has appointment on 01/16/20 @ 3 pm at their [place] location.

## 2020-01-23 ENCOUNTER — Other Ambulatory Visit (HOSPITAL_COMMUNITY): Payer: Self-pay | Admitting: Neurosurgery

## 2020-01-23 ENCOUNTER — Ambulatory Visit (INDEPENDENT_AMBULATORY_CARE_PROVIDER_SITE_OTHER): Payer: Medicare Other | Admitting: Orthopaedic Surgery

## 2020-01-23 ENCOUNTER — Telehealth: Payer: Self-pay

## 2020-01-23 ENCOUNTER — Other Ambulatory Visit: Payer: Self-pay | Admitting: Neurosurgery

## 2020-01-23 ENCOUNTER — Other Ambulatory Visit: Payer: Self-pay

## 2020-01-23 ENCOUNTER — Encounter: Payer: Self-pay | Admitting: Orthopaedic Surgery

## 2020-01-23 DIAGNOSIS — M25511 Pain in right shoulder: Secondary | ICD-10-CM

## 2020-01-23 DIAGNOSIS — F17210 Nicotine dependence, cigarettes, uncomplicated: Secondary | ICD-10-CM

## 2020-01-23 DIAGNOSIS — M4316 Spondylolisthesis, lumbar region: Secondary | ICD-10-CM

## 2020-01-23 DIAGNOSIS — G8929 Other chronic pain: Secondary | ICD-10-CM

## 2020-01-23 MED ORDER — METHOCARBAMOL 500 MG PO TABS: 500.0000 mg | ORAL_TABLET | Freq: Four times a day (QID) | ORAL | 2 refills | Status: DC

## 2020-01-23 NOTE — Telephone Encounter (Signed)
Received a call from [facility]. Pt has been taking Linzess 290 mcg daily and adding a second 290 mcg pill every other day. Previously pt was taking Linzess 145 mcg daily and add a second 145 mcg pill qod. Per pts last ov 07/2019, pt was advised to take Linzess 290 mcg once daily. Pharmacy will have pt call our office if need.

## 2020-01-23 NOTE — Progress Notes (Signed)
PROCEDURE NOTE:  The patient request injection, verbal consent was obtained.  The right shoulder was prepped appropriately after time out was performed.   Sterile technique was observed and injection of 1 cc of Depo-Medrol 40 mg with several cc's of plain xylocaine. Anesthesia was provided by ethyl chloride and a 20-gauge needle was used to inject the shoulder area. A posterior approach was used.  The injection was tolerated well.  A band aid dressing was applied.  The patient was advised to apply ice later today and tomorrow to the injection sight as needed.  I called in Robaxin for him also.  Return in one month.  Call if any problem.  Precautions discussed.   Electronically Signed Darreld Mclean, MD 4/27/20211:35 PM

## 2020-02-07 ENCOUNTER — Other Ambulatory Visit: Payer: Self-pay

## 2020-02-07 ENCOUNTER — Ambulatory Visit (HOSPITAL_COMMUNITY)
Admission: RE | Admit: 2020-02-07 | Discharge: 2020-02-07 | Disposition: A | Discharge status: Home / Self Care | Payer: Medicare Other | Source: Ambulatory Visit | Attending: Neurosurgery | Admitting: Neurosurgery

## 2020-02-07 DIAGNOSIS — M4316 Spondylolisthesis, lumbar region: Secondary | ICD-10-CM | POA: Diagnosis not present

## 2020-02-15 DIAGNOSIS — M546 Pain in thoracic spine: Secondary | ICD-10-CM | POA: Insufficient documentation

## 2020-02-19 ENCOUNTER — Other Ambulatory Visit: Payer: Self-pay | Admitting: Internal Medicine

## 2020-02-19 ENCOUNTER — Other Ambulatory Visit (HOSPITAL_COMMUNITY): Payer: Self-pay | Admitting: Internal Medicine

## 2020-02-19 DIAGNOSIS — Z122 Encounter for screening for malignant neoplasm of respiratory organs: Secondary | ICD-10-CM

## 2020-02-19 DIAGNOSIS — F17210 Nicotine dependence, cigarettes, uncomplicated: Secondary | ICD-10-CM

## 2020-02-19 DIAGNOSIS — F17200 Nicotine dependence, unspecified, uncomplicated: Secondary | ICD-10-CM

## 2020-02-19 DIAGNOSIS — Z720 Tobacco use: Secondary | ICD-10-CM

## 2020-02-20 ENCOUNTER — Ambulatory Visit (INDEPENDENT_AMBULATORY_CARE_PROVIDER_SITE_OTHER): Payer: Medicare Other | Admitting: Orthopaedic Surgery

## 2020-02-20 ENCOUNTER — Other Ambulatory Visit: Payer: Self-pay

## 2020-02-20 ENCOUNTER — Encounter: Payer: Self-pay | Admitting: Orthopaedic Surgery

## 2020-02-20 VITALS — Ht 70.0 in | Wt 170.0 lb

## 2020-02-20 DIAGNOSIS — G8929 Other chronic pain: Secondary | ICD-10-CM

## 2020-02-20 DIAGNOSIS — M25511 Pain in right shoulder: Secondary | ICD-10-CM

## 2020-02-20 DIAGNOSIS — F17210 Nicotine dependence, cigarettes, uncomplicated: Secondary | ICD-10-CM | POA: Diagnosis not present

## 2020-02-20 NOTE — Progress Notes (Signed)
PROCEDURE NOTE:  The patient request injection, verbal consent was obtained.  The right shoulder was prepped appropriately after time out was performed.   Sterile technique was observed and injection of 1 cc of Depo-Medrol 40 mg with several cc's of plain xylocaine. Anesthesia was provided by ethyl chloride and a 20-gauge needle was used to inject the shoulder area. A posterior approach was used.  The injection was tolerated well.  A band aid dressing was applied.  The patient was advised to apply ice later today and tomorrow to the injection sight as needed.  Return in one month.  Electronically Signed Darreld Mclean, MD 5/25/20212:35 PM

## 2020-02-22 ENCOUNTER — Other Ambulatory Visit: Payer: Self-pay | Admitting: Neurological Surgery

## 2020-02-22 ENCOUNTER — Other Ambulatory Visit (HOSPITAL_COMMUNITY): Payer: Self-pay | Admitting: Neurological Surgery

## 2020-02-22 DIAGNOSIS — R109 Unspecified abdominal pain: Secondary | ICD-10-CM

## 2020-02-22 DIAGNOSIS — M546 Pain in thoracic spine: Secondary | ICD-10-CM

## 2020-03-04 ENCOUNTER — Telehealth: Payer: Self-pay | Admitting: Cardiology

## 2020-03-04 NOTE — Telephone Encounter (Signed)
  Patient Consent for Virtual Visit         [name] has provided verbal consent on 03/04/2020 for a virtual visit (video or telephone).   CONSENT FOR VIRTUAL VISIT FOR:  Peter Campbell  By participating in this virtual visit I agree to the following:  I hereby voluntarily request, consent and authorize [facility] and its employed or contracted physicians, physician assistants, nurse practitioners or other licensed health care professionals (the Practitioner), to provide me with telemedicine health care services (the "Services") as deemed necessary by the treating Practitioner. I acknowledge and consent to receive the Services by the Practitioner via telemedicine. I understand that the telemedicine visit will involve communicating with the Practitioner through live audiovisual communication technology and the disclosure of certain medical information by electronic transmission. I acknowledge that I have been given the opportunity to request an in-person assessment or other available alternative prior to the telemedicine visit and am voluntarily participating in the telemedicine visit.  I understand that I have the right to withhold or withdraw my consent to the use of telemedicine in the course of my care at any time, without affecting my right to future care or treatment, and that the Practitioner or I may terminate the telemedicine visit at any time. I understand that I have the right to inspect all information obtained and/or recorded in the course of the telemedicine visit and may receive copies of available information for a reasonable fee.  I understand that some of the potential risks of receiving the Services via telemedicine include:  Marland Kitchen Delay or interruption in medical evaluation due to technological equipment failure or disruption; . Information transmitted may not be sufficient (e.g. poor resolution of images) to allow for appropriate medical decision making by the Practitioner; and/or   . In rare instances, security protocols could fail, causing a breach of personal health information.  Furthermore, I acknowledge that it is my responsibility to provide information about my medical history, conditions and care that is complete and accurate to the best of my ability. I acknowledge that Practitioner's advice, recommendations, and/or decision may be based on factors not within their control, such as incomplete or inaccurate data provided by me or distortions of diagnostic images or specimens that may result from electronic transmissions. I understand that the practice of medicine is not an exact science and that Practitioner makes no warranties or guarantees regarding treatment outcomes. I acknowledge that a copy of this consent can be made available to me via my patient portal [facility] MyChart), or I can request a printed copy by calling the office of [facility].    I understand that my insurance will be billed for this visit.   I have read or had this consent read to me. . I understand the contents of this consent, which adequately explains the benefits and risks of the Services being provided via telemedicine.  . I have been provided ample opportunity to ask questions regarding this consent and the Services and have had my questions answered to my satisfaction. . I give my informed consent for the services to be provided through the use of telemedicine in my medical care

## 2020-03-06 ENCOUNTER — Other Ambulatory Visit: Payer: Self-pay | Admitting: Neurosurgery

## 2020-03-06 DIAGNOSIS — R109 Unspecified abdominal pain: Secondary | ICD-10-CM

## 2020-03-07 ENCOUNTER — Encounter: Payer: Self-pay | Admitting: Cardiology

## 2020-03-07 ENCOUNTER — Telehealth (INDEPENDENT_AMBULATORY_CARE_PROVIDER_SITE_OTHER): Payer: Medicare Other | Admitting: Cardiology

## 2020-03-07 ENCOUNTER — Other Ambulatory Visit: Payer: Self-pay

## 2020-03-07 VITALS — Ht 70.0 in | Wt 170.0 lb

## 2020-03-07 DIAGNOSIS — I10 Essential (primary) hypertension: Secondary | ICD-10-CM

## 2020-03-07 DIAGNOSIS — I25119 Atherosclerotic heart disease of native coronary artery with unspecified angina pectoris: Secondary | ICD-10-CM | POA: Diagnosis not present

## 2020-03-07 MED ORDER — NITROGLYCERIN 0.4 MG SL SUBL: SUBLINGUAL_TABLET | SUBLINGUAL | 3 refills | Status: DC

## 2020-03-07 NOTE — Patient Instructions (Signed)
Medication Instructions:  Your physician recommends that you continue on your current medications as directed. Please refer to the Current Medication list given to you today.   I refilled your NTG    *If you need a refill on your cardiac medications before your next appointment, please call your pharmacy*   Lab Work: None today If you have labs (blood work) drawn today and your tests are completely normal, you will receive your results only by: . MyChart Message (if you have MyChart) OR . A paper copy in the mail If you have any lab test that is abnormal or we need to change your treatment, we will call you to review the results.   Testing/Procedures: None today   Follow-Up: At [facility], you and your health needs are our priority.  As part of our continuing mission to provide you with exceptional heart care, we have created designated Provider Care Teams.  These Care Teams include your primary Cardiologist (physician) and Advanced Practice Providers (APPs -  Physician Assistants and Nurse Practitioners) who all work together to provide you with the care you need, when you need it.  We recommend signing up for the patient portal called "MyChart".  Sign up information is provided on this After Visit Summary.  MyChart is used to connect with patients for Virtual Visits (Telemedicine).  Patients are able to view lab/test results, encounter notes, upcoming appointments, etc.  Non-urgent messages can be sent to your provider as well.   To learn more about what you can do with MyChart, go to [email or web address].    Your next appointment:   6 month(s)  The format for your next appointment:   In Person  Provider:   Samuel McDowell, MD   Other Instructions None      Thank you for choosing [facility] !         

## 2020-03-07 NOTE — Progress Notes (Signed)
Virtual Visit via Telephone Note   This visit type was conducted due to national recommendations for restrictions regarding the COVID-19 Pandemic (e.g. social distancing) in an effort to limit this patient's exposure and mitigate transmission in our community.  Due to his co-morbid illnesses, this patient is at least at moderate risk for complications without adequate follow up.  This format is felt to be most appropriate for this patient at this time.  The patient did not have access to video technology/had technical difficulties with video requiring transitioning to audio format only (telephone).  All issues noted in this document were discussed and addressed.  No physical exam could be performed with this format.  Please refer to the patient's chart for his  consent to telehealth for [facility].   The patient was identified using 2 identifiers.  Date:  03/07/2020   ID:  Peter Campbell, DOB [birth year], MRN [id number]  Patient Location: Home Provider Location: Home  PCP:  Alvina Filbert, MD  Cardiologist:  Nona Dell, MD  Electrophysiologist:  None   Evaluation Performed:  Follow-Up Visit  Chief Complaint:   Cardiac follow-up  History of Present Illness:    Peter Campbell is a 64 y.o. male last seen in December 2020.  We spoke by phone today.  From a cardiac perspective, he states that he used a single nitroglycerin a few weeks ago, needs a new bottle (accidentally washed it with his clothes).  Main concern is chronic pain, shoulder and back at this point.  He is due to start physical therapy in [place].  He underwent PLIF L4-5 with Dr. Wynetta Emery back in March.  Currently following in a pain clinic.  He states that he saw his PCP with lab work recently obtained, we are requesting this for review.  He is also being referred for lung cancer screening chest CT by PCP.  He has known coronary artery calcifications by prior imaging.  Reviewed the remainder of his medications.  Cardiac regimen  includes aspirin, Norvasc, Toprol-XL, and as needed nitroglycerin.  He has declined statin therapy, but we continue to address this.   Past Medical History:  Diagnosis Date  . Arthritis   . Chronic back pain   . Collagen vascular disease (HCC)   . Colonic mass    History, s/p removal; per patient.  . Constipation   . COPD (chronic obstructive pulmonary disease) (HCC)   . Coronary atherosclerosis of native coronary artery    Dense coronary atherosclerosis by chest CT November 2014   . Dyspnea    when walking distance  . Essential hypertension   . Essential tremor   . History of chicken pox   . History of [place] measles   . Pneumonia    2015 ish  . Sciatica    Past Surgical History:  Procedure Laterality Date  . CIRCUMCISION    . COLON SURGERY  '80's   for mass removal, colonostomy  . COLONOSCOPY WITH PROPOFOL N/A 05/03/2017   Procedure: COLONOSCOPY WITH PROPOFOL;  Surgeon: Corbin Ade, MD;  Location: [facility];  Service: Endoscopy;  Laterality: N/A;  1015  . colonostomy reversal  80's  . LUMBAR FUSION  11/27/2019  . Lumber discectomy    . Middle finger Right    fracture  . NASAL SEPTUM SURGERY    . POLYPECTOMY  05/03/2017   Procedure: POLYPECTOMY;  Surgeon: Corbin Ade, MD;  Location: [facility];  Service: Endoscopy;;  descending colon and rectal  . SHOULDER  ACROMIOPLASTY Right 06/12/2014   Procedure: RIGHT SHOULDER ACROMIOPLASTY;  Surgeon: Darreld Mclean, MD;  Location: [facility] ORS;  Service: Orthopedics;  Laterality: Right;  . SHOULDER OPEN ROTATOR CUFF REPAIR Right 06/12/2014   Procedure: OPEN REPAIR ROTATOR CUFF RIGHT ;  Surgeon: Darreld Mclean, MD;  Location: [facility] ORS;  Service: Orthopedics;  Laterality: Right;  . TONSILLECTOMY    . TYMPANOSTOMY TUBE PLACEMENT       Current Meds  Medication Sig  . albuterol (PROVENTIL HFA;VENTOLIN HFA) 108 (90 Base) MCG/ACT inhaler Inhale 1-2 puffs into the lungs every 6 (six) hours as needed for wheezing or shortness of breath.    Marland Kitchen amLODipine (NORVASC) 5 MG tablet TAKE 1 TABLET BY MOUTH ONCE DAILY. (Patient taking differently: Take 5 mg by mouth daily. )  . aspirin EC 81 MG tablet Take 1 tablet (81 mg total) by mouth daily.  Marland Kitchen gabapentin (NEURONTIN) 600 MG tablet Take 600 mg by mouth 3 (three) times daily.   Marland Kitchen ipratropium-albuterol (DUONEB) 0.5-2.5 (3) MG/3ML SOLN Take 3 mLs by nebulization every 6 (six) hours as needed. (Patient taking differently: Take 3 mLs by nebulization every 6 (six) hours as needed (shortness of breath). )  . linaclotide (LINZESS) 290 MCG CAPS capsule Take 1 capsule (290 mcg total) by mouth daily before breakfast. (Patient taking differently: Take 290 mcg by mouth daily. )  . meloxicam (MOBIC) 7.5 MG tablet Take 7.5 mg by mouth 2 (two) times daily.  . metoprolol succinate (TOPROL-XL) 25 MG 24 hr tablet Take 2 tablets (50 mg total) by mouth every morning. & 25 mg by mouth in the evening (Patient taking differently: Take 25-50 mg by mouth See admin instructions. Take  50 mg in the morning and 25 mg in the evening)  . nitroGLYCERIN (NITROSTAT) 0.4 MG SL tablet Place 1 tablet (0.4 mg total) under the tongue every 5 (five) minutesas needed for chest pain.  Marland Kitchen oxyCODONE-acetaminophen (PERCOCET) 7.5-325 MG tablet Take 1 tablet by mouth 3 (three) times daily as needed.  . temazepam (RESTORIL) 15 MG capsule Take 60 mg by mouth at bedtime.   Marland Kitchen XTAMPZA ER 18 MG C12A Take 1 capsule by mouth 2 (two) times daily.  . [DISCONTINUED] nitroGLYCERIN (NITROSTAT) 0.4 MG SL tablet Place 1 tablet (0.4 mg total) under the tongue every 5 (five) minutesas needed for chest pain.     Allergies:   Fentanyl   ROS:   No palpitations or syncope.  Prior CV studies:   The following studies were reviewed today:  Lexiscan Myoview 07/08/2017:  The study is normal.  This is a low risk study.  The left ventricular ejection fraction is normal (55-65%).  There was no ST segment deviation noted during stress.  Diaphragmatic  attenuation no ischemia. SDS 1. Normal wall motion EF 60%  Labs/Other Tests and Data Reviewed:    EKG:  An ECG dated 05/18/2019 was personally reviewed today and demonstrated:  Normal sinus rhythm with vertical axis.  Recent Labs: 11/24/2019: BUN 25; Creatinine, Ser 0.77; Hemoglobin 17.1; Platelets 226; Potassium 4.3; Sodium 139   Recent Lipid Panel Lab Results  Component Value Date/Time   CHOL 122 07/08/2017 08:26 AM   TRIG 104 07/08/2017 08:26 AM   HDL 33 (L) 07/08/2017 08:26 AM   CHOLHDL 3.7 07/08/2017 08:26 AM   LDLCALC 68 07/08/2017 08:26 AM    Wt Readings from Last 3 Encounters:  03/07/20 170 lb (77.1 kg)  02/20/20 170 lb (77.1 kg)  11/27/19 170 lb (77.1 kg)  Objective:    Vital Signs:  Ht 5\' 10"  (1.778 m)   Wt 170 lb (77.1 kg)   BMI 24.39 kg/m    Patient spoke in full sentences, not short of breath. No audible wheezing or coughing.  ASSESSMENT & PLAN:    1. Multivessel coronary artery calcification by CT imaging.  He does not describe any progressive angina symptoms on medical therapy, refill provided for nitroglycerin.  Myoview from 2018 was low risk.  Continue aspirin, Norvasc, and Toprol-XL.  He has declined statin therapy, however we continue to discuss this, requesting recent lab work from PCP.  2.  Essential hypertension, continue Norvasc and Toprol-XL.   Time:   Today, I have spent 8 minutes with the patient with telehealth technology discussing the above problems.     Medication Adjustments/Labs and Tests Ordered: Current medicines are reviewed at length with the patient today.  Concerns regarding medicines are outlined above.   Tests Ordered: No orders of the defined types were placed in this encounter.   Medication Changes: Meds ordered this encounter  Medications  . nitroGLYCERIN (NITROSTAT) 0.4 MG SL tablet    Sig: Place 1 tablet (0.4 mg total) under the tongue every 5 (five) minutesas needed for chest pain.    Dispense:  25 tablet     Refill:  3    Follow Up:  In Person 6 months in the [place] office.  Signed, Rozann Lesches, MD  03/07/2020 8:39 AM    [place]

## 2020-03-08 ENCOUNTER — Encounter (HOSPITAL_COMMUNITY): Payer: Self-pay

## 2020-03-08 ENCOUNTER — Ambulatory Visit (HOSPITAL_COMMUNITY): Admission: RE | Admit: 2020-03-08 | Payer: Medicare Other | Source: Ambulatory Visit

## 2020-03-15 ENCOUNTER — Ambulatory Visit (HOSPITAL_COMMUNITY)
Admission: RE | Admit: 2020-03-15 | Discharge: 2020-03-15 | Disposition: A | Discharge status: Home / Self Care | Payer: Medicare Other | Source: Ambulatory Visit | Attending: Neurological Surgery | Admitting: Neurological Surgery

## 2020-03-15 ENCOUNTER — Other Ambulatory Visit: Payer: Self-pay

## 2020-03-15 DIAGNOSIS — R109 Unspecified abdominal pain: Secondary | ICD-10-CM | POA: Insufficient documentation

## 2020-03-19 ENCOUNTER — Ambulatory Visit (INDEPENDENT_AMBULATORY_CARE_PROVIDER_SITE_OTHER): Payer: Medicare Other | Admitting: Orthopaedic Surgery

## 2020-03-19 ENCOUNTER — Other Ambulatory Visit: Payer: Self-pay

## 2020-03-19 ENCOUNTER — Encounter: Payer: Self-pay | Admitting: Orthopaedic Surgery

## 2020-03-19 DIAGNOSIS — M25511 Pain in right shoulder: Secondary | ICD-10-CM

## 2020-03-19 DIAGNOSIS — G8929 Other chronic pain: Secondary | ICD-10-CM | POA: Diagnosis not present

## 2020-03-19 DIAGNOSIS — F17210 Nicotine dependence, cigarettes, uncomplicated: Secondary | ICD-10-CM | POA: Diagnosis not present

## 2020-03-19 NOTE — Patient Instructions (Signed)

## 2020-03-19 NOTE — Progress Notes (Signed)
PROCEDURE NOTE:  The patient request injection, verbal consent was obtained.  The right shoulder was prepped appropriately after time out was performed.   Sterile technique was observed and injection of 1 cc of Depo-Medrol 40 mg with several cc's of plain xylocaine. Anesthesia was provided by ethyl chloride and a 20-gauge needle was used to inject the shoulder area. A posterior approach was used.  The injection was tolerated well.  A band aid dressing was applied.  The patient was advised to apply ice later today and tomorrow to the injection sight as needed.  Return in one month.  Electronically Signed Darreld Mclean, MD 6/22/20212:17 PM

## 2020-03-26 ENCOUNTER — Telehealth: Payer: Self-pay

## 2020-03-26 ENCOUNTER — Telehealth: Payer: Self-pay | Admitting: Internal Medicine

## 2020-03-26 MED ORDER — LINACLOTIDE 290 MCG PO CAPS: 290.0000 ug | ORAL_CAPSULE | Freq: Every day | ORAL | 3 refills | Status: DC

## 2020-03-26 NOTE — Telephone Encounter (Signed)
See other note by AM.  Samples given and note sent for new RX request.

## 2020-03-26 NOTE — Telephone Encounter (Signed)
Pt walked in office. Pt went to fill his Linzess 290 mcg and was told by his pharmacy that his RX ran out. Pt would like a refill of Linzess 290 mcg sent to [facility].

## 2020-03-26 NOTE — Addendum Note (Signed)
Addended by: Gelene Mink on: 03/26/2020 03:13 PM   Modules accepted: Orders

## 2020-03-26 NOTE — Telephone Encounter (Signed)
Completed.

## 2020-03-26 NOTE — Telephone Encounter (Signed)
PATIENT CAME IN OFFICE REQUESTING LINZESS SAMPLES

## 2020-03-29 ENCOUNTER — Encounter (HOSPITAL_COMMUNITY): Payer: Self-pay | Admitting: Emergency Medicine

## 2020-03-29 ENCOUNTER — Emergency Department (HOSPITAL_COMMUNITY): Payer: Medicare Other

## 2020-03-29 ENCOUNTER — Other Ambulatory Visit: Payer: Self-pay

## 2020-03-29 ENCOUNTER — Emergency Department (HOSPITAL_COMMUNITY)
Admission: EM | Admit: 2020-03-29 | Discharge: 2020-03-29 | Disposition: A | Discharge status: Home / Self Care | Payer: Medicare Other | Attending: Emergency Medicine | Admitting: Emergency Medicine

## 2020-03-29 DIAGNOSIS — G8928 Other chronic postprocedural pain: Secondary | ICD-10-CM | POA: Insufficient documentation

## 2020-03-29 DIAGNOSIS — I10 Essential (primary) hypertension: Secondary | ICD-10-CM | POA: Insufficient documentation

## 2020-03-29 DIAGNOSIS — R1084 Generalized abdominal pain: Secondary | ICD-10-CM | POA: Insufficient documentation

## 2020-03-29 DIAGNOSIS — I2510 Atherosclerotic heart disease of native coronary artery without angina pectoris: Secondary | ICD-10-CM | POA: Insufficient documentation

## 2020-03-29 DIAGNOSIS — M545 Low back pain: Secondary | ICD-10-CM | POA: Insufficient documentation

## 2020-03-29 DIAGNOSIS — M549 Dorsalgia, unspecified: Secondary | ICD-10-CM

## 2020-03-29 DIAGNOSIS — Z79899 Other long term (current) drug therapy: Secondary | ICD-10-CM | POA: Insufficient documentation

## 2020-03-29 DIAGNOSIS — Z7982 Long term (current) use of aspirin: Secondary | ICD-10-CM | POA: Diagnosis not present

## 2020-03-29 DIAGNOSIS — J449 Chronic obstructive pulmonary disease, unspecified: Secondary | ICD-10-CM | POA: Insufficient documentation

## 2020-03-29 DIAGNOSIS — F17210 Nicotine dependence, cigarettes, uncomplicated: Secondary | ICD-10-CM | POA: Insufficient documentation

## 2020-03-29 LAB — CBC WITH DIFFERENTIAL/PLATELET
Abs Immature Granulocytes: 0.09 10*3/uL — ABNORMAL HIGH (ref 0.00–0.07)
Basophils Absolute: 0.1 10*3/uL (ref 0.0–0.1)
Basophils Relative: 1 %
Eosinophils Absolute: 0.1 10*3/uL (ref 0.0–0.5)
Eosinophils Relative: 1 %
HCT: 50.5 % (ref 39.0–52.0)
Hemoglobin: 16.6 g/dL (ref 13.0–17.0)
Immature Granulocytes: 1 %
Lymphocytes Relative: 15 %
Lymphs Abs: 1.5 10*3/uL (ref 0.7–4.0)
MCH: 31.7 pg (ref 26.0–34.0)
MCHC: 32.9 g/dL (ref 30.0–36.0)
MCV: 96.6 fL (ref 80.0–100.0)
Monocytes Absolute: 0.8 10*3/uL (ref 0.1–1.0)
Monocytes Relative: 8 %
Neutro Abs: 7.6 10*3/uL (ref 1.7–7.7)
Neutrophils Relative %: 74 %
Platelets: 249 10*3/uL (ref 150–400)
RBC: 5.23 MIL/uL (ref 4.22–5.81)
RDW: 12.5 % (ref 11.5–15.5)
WBC: 10.2 10*3/uL (ref 4.0–10.5)
nRBC: 0 % (ref 0.0–0.2)

## 2020-03-29 LAB — URINALYSIS, ROUTINE W REFLEX MICROSCOPIC
Bilirubin Urine: NEGATIVE
Glucose, UA: NEGATIVE mg/dL
Hgb urine dipstick: NEGATIVE
Ketones, ur: NEGATIVE mg/dL
Leukocytes,Ua: NEGATIVE
Nitrite: NEGATIVE
Protein, ur: NEGATIVE mg/dL
Specific Gravity, Urine: 1.017 (ref 1.005–1.030)
pH: 5 (ref 5.0–8.0)

## 2020-03-29 LAB — COMPREHENSIVE METABOLIC PANEL
ALT: 17 U/L (ref 0–44)
AST: 13 U/L — ABNORMAL LOW (ref 15–41)
Albumin: 4 g/dL (ref 3.5–5.0)
Alkaline Phosphatase: 80 U/L (ref 38–126)
Anion gap: 9 (ref 5–15)
BUN: 20 mg/dL (ref 8–23)
CO2: 25 mmol/L (ref 22–32)
Calcium: 9.5 mg/dL (ref 8.9–10.3)
Chloride: 103 mmol/L (ref 98–111)
Creatinine, Ser: 0.8 mg/dL (ref 0.61–1.24)
GFR calc Af Amer: >60 mL/min (ref >60)
GFR calc non Af Amer: >60 mL/min (ref >60)
Glucose, Bld: 120 mg/dL — ABNORMAL HIGH (ref 70–99)
Potassium: 4.1 mmol/L (ref 3.5–5.1)
Sodium: 137 mmol/L (ref 135–145)
Total Bilirubin: 0.6 mg/dL (ref 0.3–1.2)
Total Protein: 6.7 g/dL (ref 6.5–8.1)

## 2020-03-29 LAB — LIPASE, BLOOD: Lipase: 27 U/L (ref 11–51)

## 2020-03-29 MED ORDER — IOHEXOL 300 MG/ML  SOLN
100.0000 mL | Freq: Once | INTRAMUSCULAR | Status: AC | PRN
  Administered 2020-03-29: 100 mL via INTRAVENOUS

## 2020-03-29 MED ORDER — MORPHINE SULFATE (PF) 4 MG/ML IV SOLN
4.0000 mg | Freq: Once | INTRAVENOUS | Status: AC
  Administered 2020-03-29: 4 mg via INTRAVENOUS
  Filled 2020-03-29: qty 1

## 2020-03-29 NOTE — ED Notes (Signed)
Patient Alert and oriented to baseline. Stable and ambulatory to baseline. Patient verbalized understanding of the discharge instructions.  Patient belongings were taken by the patient.   

## 2020-03-29 NOTE — ED Provider Notes (Signed)
[facility] EMERGENCY DEPARTMENT Provider Note   CSN: [id number] Arrival date & time: 03/29/20  1020     History Chief Complaint  Patient presents with  . Back Pain  . Lymphadenopathy    abdomen    MANDELL Campbell is a 64 y.o. male with history of arthritis, chronic back pain, collagen vascular disease, COPD, hypertension presents for evaluation of ongoing and worsening low back pain and abdominal pain.  He reports that his symptoms have been very bothersome over the last 3 weeks but have been progressively developing since he underwent PLIF at L4-5 on 11/27/2019 with Dr. Saintclair Campbell.  He reports that since the surgery the weakness in his lower extremities has improved but he has been experiencing dull back pain, worse on the right side, worsens with certain position changes.  He feels a little bit better laying on his left side.  Denies bowel or bladder incontinence, saddle anesthesia, fevers.  He reports feeling his abdomen has become progressively more and more protuberant and bloated.  Denies nausea, vomiting.  He was experiencing constipation related to opioids but since starting Linzess recently he has had significant improvement in this.  He denies urinary symptoms.  He notes chronic shortness of breath due to history of cigarette smoking and states this is at baseline.  Denies chest pain.  He was seen and evaluated by Dr. Saintclair Campbell outpatient and underwent CT of the chest and abdomen without contrast on 03/15/2020.  He was seen and evaluated by Dr. Saintclair Campbell outpatient this morning and states that Dr. Saintclair Campbell recommended presentation to the ED for further evaluation and management.  He has had several prior abdominal surgeries.  The history is provided by the patient.       Past Medical History:  Diagnosis Date  . Arthritis   . Chronic back pain   . Collagen vascular disease ([place])   . Colonic mass    History, s/p removal; per patient.  . Constipation   . COPD (chronic obstructive  pulmonary disease) ([place])   . Coronary atherosclerosis of native coronary artery    Dense coronary atherosclerosis by chest CT November 2014   . Dyspnea    when walking distance  . Essential hypertension   . Essential tremor   . History of chicken pox   . History of [place] measles   . Pneumonia    2015 ish  . Sciatica     Patient Active Problem List   Diagnosis Date Noted  . Spondylolisthesis at L4-L5 level 11/27/2019  . Abdominal pain 08/04/2017  . Constipation 01/11/2017  . History of colonic polyps 01/11/2017  . Right shoulder pain 12/03/2015  . Muscle weakness (generalized) 07/02/2014  . Decreased range of motion of right shoulder 07/02/2014  . Rotator cuff impingement syndrome of right shoulder 06/12/2014  . Diverticulosis 10/12/2013  . Chest pain 10/12/2013  . Low back pain 10/12/2013  . Coronary atherosclerosis of native coronary artery 09/26/2013  . Tobacco abuse 09/26/2013  . Essential hypertension, benign 09/25/2013  . Coronary artery calcification seen on CAT scan 08/17/2013  . History of alcohol abuse 08/17/2013  . History of syncope 08/17/2013  . Hypertension 08/17/2013  . Tremor, essential 08/17/2013  . Chronic pain syndrome 08/17/2013  . Neuropathy 08/17/2013    Past Surgical History:  Procedure Laterality Date  . CIRCUMCISION    . COLON SURGERY  '80's   for mass removal, colonostomy  . COLONOSCOPY WITH PROPOFOL N/A 05/03/2017   Procedure: COLONOSCOPY WITH PROPOFOL;  Surgeon:  Peter Campbell, Peter Estimable, MD;  Location: [facility];  Service: Endoscopy;  Laterality: N/A;  1015  . colonostomy reversal  80's  . LUMBAR FUSION  11/27/2019  . Lumber discectomy    . Middle finger Right    fracture  . NASAL SEPTUM SURGERY    . POLYPECTOMY  05/03/2017   Procedure: POLYPECTOMY;  Surgeon: Peter Dolin, MD;  Location: [facility];  Service: Endoscopy;;  descending colon and rectal  . SHOULDER ACROMIOPLASTY Right 06/12/2014   Procedure: RIGHT SHOULDER ACROMIOPLASTY;   Surgeon: Peter Kava, MD;  Location: [facility] ORS;  Service: Orthopedics;  Laterality: Right;  . SHOULDER OPEN ROTATOR CUFF REPAIR Right 06/12/2014   Procedure: OPEN REPAIR ROTATOR CUFF RIGHT ;  Surgeon: Peter Kava, MD;  Location: [facility] ORS;  Service: Orthopedics;  Laterality: Right;  . TONSILLECTOMY    . TYMPANOSTOMY TUBE PLACEMENT         Family History  Problem Relation Age of Onset  . COPD Father   . Cancer Father   . Cancer Mother   . Heart attack Brother   . Epilepsy Sister   . Hypertension Sister   . Colon cancer Neg Hx     Social History   Tobacco Use  . Smoking status: Current Every Day Smoker    Packs/day: 1.50    Years: 30.00    Pack years: 45.00    Types: Cigarettes    Start date: 08/28/1969  . Smokeless tobacco: Never Used  Vaping Use  . Vaping Use: Never used  Substance Use Topics  . Alcohol use: No    Alcohol/week: 0.0 standard drinks  . Drug use: Yes    Types: Marijuana, Other-see comments    Comment: Pt uses Xanax- prn    Home Medications Prior to Admission medications   Medication Sig Start Date End Date Taking? Authorizing Provider  albuterol (PROVENTIL HFA;VENTOLIN HFA) 108 (90 Base) MCG/ACT inhaler Inhale 1-2 puffs into the lungs every 6 (six) hours as needed for wheezing or shortness of breath.   Yes [provider]  amLODipine (NORVASC) 5 MG tablet TAKE 1 TABLET BY MOUTH ONCE DAILY. Patient taking differently: Take 5 mg by mouth in the morning.  10/13/19  Yes Satira Sark, MD  aspirin EC 81 MG tablet Take 1 tablet (81 mg total) by mouth daily. 10/31/13  Yes Satira Sark, MD  gabapentin (NEURONTIN) 600 MG tablet Take 600 mg by mouth 3 (three) times daily.  01/26/18  Yes [provider]  ipratropium-albuterol (DUONEB) 0.5-2.5 (3) MG/3ML SOLN Take 3 mLs by nebulization every 6 (six) hours as needed. Patient taking differently: Take 3 mLs by nebulization every 6 (six) hours as needed (shortness of breath).  06/19/15  Yes Veryl Speak, MD  linaclotide [facility]) 290 MCG CAPS capsule Take 1 capsule (290 mcg total) by mouth daily before breakfast. 03/26/20  Yes Annitta Needs, NP  metoprolol succinate (TOPROL-XL) 25 MG 24 hr tablet Take 2 tablets (50 mg total) by mouth every morning. & 25 mg by mouth in the evening Patient taking differently: Take 25-50 mg by mouth See admin instructions. Take 50 mg by mouth in the morning and 25 mg after supper 09/11/19  Yes Satira Sark, MD  naloxone [facility]) 4 MG/0.1ML LIQD nasal spray kit Place 1 spray into the nose as needed (as directed).   Yes [provider]  nitroGLYCERIN (NITROSTAT) 0.4 MG SL tablet Place 1 tablet (0.4 mg total) under the tongue every 5 (five) minutesas  needed for chest pain. 03/07/20  Yes Satira Sark, MD  oxyCODONE-acetaminophen (PERCOCET) 7.5-325 MG tablet Take 1 tablet by mouth 4 (four) times daily.  02/16/20  Yes [provider]  temazepam (RESTORIL) 30 MG capsule Take 60 mg by mouth at bedtime.   Yes [provider]  XTAMPZA ER 18 MG C12A Take 18 mg by mouth 2 (two) times daily.  02/12/20  Yes [provider]  meloxicam (MOBIC) 7.5 MG tablet Take 7.5 mg by mouth 2 (two) times daily. Patient not taking: Reported on 03/29/2020 02/16/20   [provider]  methocarbamol (ROBAXIN) 500 MG tablet Take 1 tablet (500 mg total) by mouth 4 (four) times daily. Patient not taking: Reported on 02/20/2020 01/23/20   Peter Kava, MD  naproxen (NAPROSYN) 500 MG tablet Take 500 mg by mouth 2 (two) times daily after a meal.    [provider]    Allergies    Fentanyl  Review of Systems   Review of Systems  Constitutional: Negative for chills and fever.  Respiratory: Negative for shortness of breath (chronic, unchanged).   Cardiovascular: Negative for chest pain.  Gastrointestinal: Positive for abdominal distention and abdominal pain. Negative for constipation, diarrhea, nausea and vomiting.  Musculoskeletal:  Positive for back pain.  Neurological: Negative for weakness and numbness.  All other systems reviewed and are negative.   Physical Exam Updated Vital Signs BP 122/82   Pulse (!) 59   Temp 98.3 F (36.8 C) (Oral)   Resp (!) 23   SpO2 94%   Physical Exam Vitals and nursing note reviewed.  Constitutional:      General: He is not in acute distress.    Appearance: He is well-developed.  HENT:     Head: Normocephalic and atraumatic.  Eyes:     General:        Right eye: No discharge.        Left eye: No discharge.     Conjunctiva/sclera: Conjunctivae normal.  Neck:     Vascular: No JVD.     Trachea: No tracheal deviation.  Cardiovascular:     Rate and Rhythm: Normal rate and regular rhythm.  Pulmonary:     Effort: Pulmonary effort is normal.     Comments: Diffuse expiratory wheezes Abdominal:     General: A surgical scar is present. Bowel sounds are increased. There is no distension.     Palpations: Abdomen is soft.     Tenderness: There is abdominal tenderness. There is no right CVA tenderness, left CVA tenderness, guarding or rebound.     Comments: Generalized tenderness to palpation, worse along the right side of the abdomen.  Musculoskeletal:     Cervical back: Neck supple.     Comments: Well-healed midline lower lumbar spine surgical incision.  There is some tenderness to palpation at the level L of L4-5 and L5-S1 with bilateral paralumbar muscle tenderness, right worse than left.  5/5 strength of BLE major muscle groups.  Skin:    General: Skin is warm and dry.     Findings: No erythema.  Neurological:     Mental Status: He is alert.     Comments: Sensation intact to light touch of bilateral lower extremities  Psychiatric:        Behavior: Behavior normal.     ED Results / Procedures / Treatments   Labs (all labs ordered are listed, but only abnormal results are displayed) Labs Reviewed  CBC WITH DIFFERENTIAL/PLATELET - Abnormal; Notable for the following  components:      Result Value   Abs Immature Granulocytes 0.09 (*)    All other components within normal limits  COMPREHENSIVE METABOLIC PANEL - Abnormal; Notable for the following components:   Glucose, Bld 120 (*)    AST 13 (*)    All other components within normal limits  LIPASE, BLOOD  URINALYSIS, ROUTINE W REFLEX MICROSCOPIC    EKG None  Radiology No results found.  Procedures Procedures (including critical care time)  Medications Ordered in ED Medications  morphine 4 MG/ML injection 4 mg (4 mg Intravenous Given 03/29/20 1524)    ED Course  I have reviewed the triage vital signs and the nursing notes.  Pertinent labs & imaging results that were available during my care of the patient were reviewed by me and considered in my medical decision making (see chart for details).    MDM Rules/Calculators/A&P                          Patient presents for evaluation of abdominal pain and low back pain.  He was sent by his neurosurgeon Dr. Saintclair Campbell who reportedly saw him in the office today for the symptoms.  The patient is afebrile, vital signs are stable.  He is nontoxic in appearance.  He has no red flag signs concerning for cauda equina or spinal abscess.  He had imaging performed 2 weeks ago, noncontrast abdominal CT and noncontrast chest CT which did not show any acute findings.  No concern for postoperative infection.  His abdomen is protuberant but soft with no rebound or guarding.  He has numerous surgical scars from prior abdominal surgeries.  Will obtain imaging and obtain contrasted CT of the abdomen and pelvis as well as dedicated images of the lumbar spine for further evaluation.  Lab work reviewed and interpreted by myself show no leukocytosis, no anemia, no metabolic derangements or renal insufficiency.  LFTs and lipase are not elevated.  UA is not concerning for UTI or nephrolithiasis.  Patient's pain has been managed in the ED.   3:30PM Signed out care to oncoming  provider Dr. Alric Ran.  Pending imaging to rule out acute surgical abdominal pathology or serious postoperative complication.  If imaging is reassuring he will likely be stable for discharge home with outpatient follow-up with PCP and neurosurgery.    Final Clinical Impression(s) / ED Diagnoses Final diagnoses:  Back pain  Generalized abdominal pain    Rx / DC Orders ED Discharge Orders    None       Debroah Baller 03/29/20 1604    Charlesetta Shanks, MD 04/01/20 1812

## 2020-03-29 NOTE — Discharge Instructions (Addendum)
Please follow up with PCP. Recommend continuing treatments including PT, Tylenol/Motrin, heat/ice for continued back pain.

## 2020-03-29 NOTE — ED Triage Notes (Signed)
Pt. Stated, Im having swelling all in my stomach and part of my back, started about a month or so. It stasred in my back and move to my stomach. Dr. Darrin Luis sent me here.

## 2020-03-29 NOTE — ED Provider Notes (Signed)
  Physical Exam  BP (!) 122/97 (BP Location: Right Arm)   Pulse 69   Temp 98.3 F (36.8 C) (Oral)   Resp 16   SpO2 95%   Physical Exam  Agree with initial physical exam performed by off going team. Please see their note for full PE.   ED Course/Procedures     Procedures  CT Abdomen/Pelvis IMPRESSION: 1. No CT findings of the abdomen or pelvis to explain right-sided abdominal pain. 2. Descending and sigmoid diverticulosis without evidence of acute diverticulitis. 3. Aortic Atherosclerosis (ICD10-I70.0).  CT Lspine Postsurgical changes at the L4-L5 and L5-S1 level, unchanged as compared to prior CT 02/07/2020.  Lumbar spondylosis as outlined and also unchanged as compared to this prior exam. No more than mild appreciable spinal canal stenosis at any level. Multilevel neural foraminal narrowing greatest bilaterally at bilaterally at L3-L4, bilaterally at L4-L5 and on the left at L5-S1 (moderate in severity at these sites).  MDM   Medical Decision Making: Care of patient assumed from off going team at 2:57 PM. Agree with history, physical exam and plan. See their note for further details. Briefly, 64 y.o. male:  Pt p/w 3 wks worsening R flank/abdominal pain that is constant. - 3 mo s/p spinal operation. Patient seen earlier today by surgeon and sent for further evaluation - Endorses sensation of abdominal distention, seen as outpatient with imaging ordered on 6/18 that did not show acute change - Labs wo significant abnormalities - Hx of possible alcohol use, multiple prior abdominal surgeries, no emesis, having normal BMs - No fever, infectious symptoms - No red flag symptoms for back pain/cauda equina  Current plan is as follows:  - F/u imaging --> no acute abnormalities noted on imaging  - Likely home with pcp outpatient follow up  On initial assessment, patient afebrile, HDS, NAD. Pt imaging reviewed and was not significant for acute abnormalities that could be  leading to reported pain. Secondary exam performed and was not significant for overlying skin changes to back or abdomen. Pt abdomen soft, non-distended. Mild generalized tenderness to deep palpation. Discussed findings with patient and son at bedside. Plan for patient to follow up with PCP for persistent back/abdominal pain. Strict return precautions given. Pt/son in agreement with plan. Pt discharged in stable condition.   Vitals:   03/29/20 1119  BP: (!) 122/97  Pulse: 69  Temp: 98.3 F (36.8 C)  Resp: 16  SpO2: 95%  TempSrc: Gwenlyn Fudge, MD 03/29/20 2256    Sabas Sous, MD 03/30/20 0000

## 2020-04-14 NOTE — Progress Notes (Deleted)
Referring Provider: Abran Richard, MD Primary Care Physician:  Abran Richard, MD Primary GI Physician: Dr. Gala Romney  No chief complaint on file.   HPI:   Peter Campbell is a 64 y.o. male presenting today for follow-up of opioid induced constipation.  He was last seen in our office 08/18/2019 for the same.  He had increased Linzess to 290 mcg daily.  He was having BMs daily and abdominal pain had resolved.  He was taking oxycodone for musculoskeletal issues.  No alarm symptoms.  Colonoscopy is up-to-date in 2018 with 2 polyps (path with benign colon mucosa and hyperplastic polyp), scattered pancolonic diverticula, surgical anastomosis at 25 cm from anal verge, and internal hemorrhoids, due for surveillance in 2023.  Patient was seen in the ED on 03/29/2020 for 3 weeks of ongoing and worsening low back pain and abdominal pain.  Symptoms have been progressively developing since he underwent  PLIF at L4-5 on 11/27/2019 with Dr. Saintclair Halsted.  Noted improvement in lower extremity weakness since surgery but has been experiencing dull back pain, worse on the right side, and worse with certain position changes.  Felt abdomen was becoming progressively more protuberant and bloated.  Linzess was working well for constipation.  Laboratory work-up with CMP essentially normal, CBC within normal limits, lipase within normal limits, UA negative.  CT A/P with contrast with no findings in the abdomen or pelvis to explain right-sided abdominal pain.  He did have descending and sigmoid diverticulosis without acute diverticulitis.  CT L-spine with postsurgical changes at L4-L5 and L5-S1 level, unchanged compared to prior CT on 02/07/2020.  Also with lumbar spondylosis unchanged compared to prior exam.  No more than mild impressionable spinal canal stenosis at any level.  Multilevel neural foraminal narrowing greatest bilaterally at bilateral L3-L4, bilaterally at L4-L5, and on the left at L5-S1 (moderate in severity at the sites).   With no acute findings in the ED, patient was advised to follow-up with PCP for persistent\abdominal pain.  Today:    Past Medical History:  Diagnosis Date  . Arthritis   . Chronic back pain   . Collagen vascular disease ([place])   . Colonic mass    History, s/p removal; per patient.  . Constipation   . COPD (chronic obstructive pulmonary disease) ([employer])   . Coronary atherosclerosis of native coronary artery    Dense coronary atherosclerosis by chest CT November 2014   . Dyspnea    when walking distance  . Essential hypertension   . Essential tremor   . History of chicken pox   . History of [place] measles   . Pneumonia    2015 ish  . Sciatica     Past Surgical History:  Procedure Laterality Date  . CIRCUMCISION    . COLON SURGERY  '80's   for mass removal, colonostomy  . COLONOSCOPY WITH PROPOFOL N/A 05/03/2017   Procedure: COLONOSCOPY WITH PROPOFOL;  Surgeon: Daneil Dolin, MD;  [facility];  Service: Endoscopy;  Laterality: N/A;  1015  . colonostomy reversal  80's  . LUMBAR FUSION  11/27/2019  . Lumber discectomy    . Middle finger Right    fracture  . NASAL SEPTUM SURGERY    . POLYPECTOMY  05/03/2017   Procedure: POLYPECTOMY;  Surgeon: Daneil Dolin, MD;  [facility];  Service: Endoscopy;;  descending colon and rectal  . SHOULDER ACROMIOPLASTY Right 06/12/2014   Procedure: RIGHT SHOULDER ACROMIOPLASTY;  Surgeon: [name], MD;  Location: [facility];  Service: Orthopedics;  Laterality: Right;  . SHOULDER OPEN ROTATOR CUFF REPAIR Right 06/12/2014   Procedure: OPEN REPAIR ROTATOR CUFF RIGHT ;  Surgeon: [name], MD;  Location: [facility];  Service: Orthopedics;  Laterality: Right;  . TONSILLECTOMY    . TYMPANOSTOMY TUBE PLACEMENT      Current Outpatient Medications  Medication Sig Dispense Refill  . albuterol (PROVENTIL HFA;VENTOLIN HFA) 108 (90 Base) MCG/ACT inhaler Inhale 1-2 puffs into the lungs every 6 (six) hours as needed for wheezing or  shortness of breath.    Marland Kitchen amLODipine (NORVASC) 5 MG tablet TAKE 1 TABLET BY MOUTH ONCE DAILY. (Patient taking differently: Take 5 mg by mouth in the morning. ) 90 tablet 3  . aspirin EC 81 MG tablet Take 1 tablet (81 mg total) by mouth daily. 90 tablet 3  . gabapentin (NEURONTIN) 600 MG tablet Take 600 mg by mouth 3 (three) times daily.     Marland Kitchen ipratropium-albuterol (DUONEB) 0.5-2.5 (3) MG/3ML SOLN Take 3 mLs by nebulization every 6 (six) hours as needed. (Patient taking differently: Take 3 mLs by nebulization every 6 (six) hours as needed (shortness of breath). ) 360 mL 0  . linaclotide (LINZESS) 290 MCG CAPS capsule Take 1 capsule (290 mcg total) by mouth daily before breakfast. 90 capsule 3  . meloxicam (MOBIC) 7.5 MG tablet Take 7.5 mg by mouth 2 (two) times daily. (Patient not taking: Reported on 03/29/2020)    . methocarbamol (ROBAXIN) 500 MG tablet Take 1 tablet (500 mg total) by mouth 4 (four) times daily. (Patient not taking: Reported on 02/20/2020) 30 tablet 2  . metoprolol succinate (TOPROL-XL) 25 MG 24 hr tablet Take 2 tablets (50 mg total) by mouth every morning. & 25 mg by mouth in the evening (Patient taking differently: Take 25-50 mg by mouth See admin instructions. Take 50 mg by mouth in the morning and 25 mg after supper) 270 tablet 3  . naloxone (NARCAN) 4 MG/0.1ML LIQD nasal spray kit Place 1 spray into the nose as needed (as directed).    . naproxen (NAPROSYN) 500 MG tablet Take 500 mg by mouth 2 (two) times daily after a meal.    . nitroGLYCERIN (NITROSTAT) 0.4 MG SL tablet Place 1 tablet (0.4 mg total) under the tongue every 5 (five) minutesas needed for chest pain. 25 tablet 3  . oxyCODONE-acetaminophen (PERCOCET) 7.5-325 MG tablet Take 1 tablet by mouth 4 (four) times daily.     . temazepam (RESTORIL) 30 MG capsule Take 60 mg by mouth at bedtime.    Marland Kitchen XTAMPZA ER 18 MG C12A Take 18 mg by mouth 2 (two) times daily.      No current facility-administered medications for this visit.     Allergies as of 04/15/2020 - Review Complete 03/29/2020  Allergen Reaction Noted  . Fentanyl Other (See Comments) 05/16/2019    Family History  Problem Relation Age of Onset  . COPD Father   . Cancer Father   . Cancer Mother   . Heart attack Brother   . Epilepsy Sister   . Hypertension Sister   . Colon cancer Neg Hx     Social History   Socioeconomic History  . Marital status: Widowed    Spouse name: Not on file  . Number of children: Not on file  . Years of education: Not on file  . Highest education level: Not on file  Occupational History  . Occupation: Disabled  Tobacco Use  . Smoking status: Current Every Day Smoker  Packs/day: 1.50    Years: 30.00    Pack years: 45.00    Types: Cigarettes    Start date: 08/28/1969  . Smokeless tobacco: Never Used  Vaping Use  . Vaping Use: Never used  Substance and Sexual Activity  . Alcohol use: No    Alcohol/week: 0.0 standard drinks  . Drug use: Yes    Types: Marijuana, Other-see comments    Comment: Pt uses Xanax- prn  . Sexual activity: Not on file  Other Topics Concern  . Not on file  Social History Narrative   Pt lives alone in [place]; on disability   Social Determinants of Health   Financial Resource Strain:   . Difficulty of Paying Living Expenses:   Food Insecurity:   . Worried About Charity fundraiser in the Last Year:   . Arboriculturist in the Last Year:   Transportation Needs:   . Film/video editor (Medical):   Marland Kitchen Lack of Transportation (Non-Medical):   Physical Activity:   . Days of Exercise per Week:   . Minutes of Exercise per Session:   Stress:   . Feeling of Stress :   Social Connections:   . Frequency of Communication with Friends and Family:   . Frequency of Social Gatherings with Friends and Family:   . Attends Religious Services:   . Active Member of Clubs or Organizations:   . Attends Archivist Meetings:   Marland Kitchen Marital Status:     Review of Systems: Gen: Denies  fever, chills, anorexia. Denies fatigue, weakness, weight loss.  CV: Denies chest pain, palpitations, syncope, peripheral edema, and claudication. Resp: Denies dyspnea at rest, cough, wheezing, coughing up blood, and pleurisy. GI: Denies vomiting blood, jaundice, and fecal incontinence.   Denies dysphagia or odynophagia. Derm: Denies rash, itching, dry skin Psych: Denies depression, anxiety, memory loss, confusion. No homicidal or suicidal ideation.  Heme: Denies bruising, bleeding, and enlarged lymph nodes.  Physical Exam: There were no vitals taken for this visit. General:   Alert and oriented. No distress noted. Pleasant and cooperative.  Head:  Normocephalic and atraumatic. Eyes:  Conjuctiva clear without scleral icterus. Mouth:  Oral mucosa pink and moist. Good dentition. No lesions. Heart:  S1, S2 present without murmurs appreciated. Lungs:  Clear to auscultation bilaterally. No wheezes, rales, or rhonchi. No distress.  Abdomen:  +BS, soft, non-tender and non-distended. No rebound or guarding. No HSM or masses noted. Msk:  Symmetrical without gross deformities. Normal posture. Extremities:  Without edema. Neurologic:  Alert and  oriented x4 Psych:  Alert and cooperative. Normal mood and affect.

## 2020-04-15 ENCOUNTER — Ambulatory Visit: Payer: Medicare Other | Admitting: Gastroenterology

## 2020-04-15 ENCOUNTER — Encounter: Payer: Self-pay | Admitting: Internal Medicine

## 2020-04-16 ENCOUNTER — Encounter: Payer: Self-pay | Admitting: Orthopaedic Surgery

## 2020-04-16 ENCOUNTER — Ambulatory Visit (INDEPENDENT_AMBULATORY_CARE_PROVIDER_SITE_OTHER): Payer: Medicare Other | Admitting: Orthopaedic Surgery

## 2020-04-16 ENCOUNTER — Other Ambulatory Visit: Payer: Self-pay

## 2020-04-16 VITALS — Ht 70.0 in | Wt 171.0 lb

## 2020-04-16 DIAGNOSIS — M25511 Pain in right shoulder: Secondary | ICD-10-CM | POA: Diagnosis not present

## 2020-04-16 DIAGNOSIS — F17210 Nicotine dependence, cigarettes, uncomplicated: Secondary | ICD-10-CM

## 2020-04-16 DIAGNOSIS — G8929 Other chronic pain: Secondary | ICD-10-CM

## 2020-04-16 NOTE — Progress Notes (Signed)
PROCEDURE NOTE:  The patient request injection, verbal consent was obtained.  The right shoulder was prepped appropriately after time out was performed.   Sterile technique was observed and injection of 1 cc of Depo-Medrol 40 mg with several cc's of plain xylocaine. Anesthesia was provided by ethyl chloride and a 20-gauge needle was used to inject the shoulder area. A posterior approach was used.  The injection was tolerated well.  A band aid dressing was applied.  The patient was advised to apply ice later today and tomorrow to the injection sight as needed.  Return in one month.  Electronically Signed Darreld Mclean, MD 7/20/20212:26 PM

## 2020-04-30 ENCOUNTER — Ambulatory Visit (INDEPENDENT_AMBULATORY_CARE_PROVIDER_SITE_OTHER): Payer: Medicare Other | Admitting: Gastroenterology

## 2020-04-30 ENCOUNTER — Other Ambulatory Visit: Payer: Self-pay

## 2020-04-30 ENCOUNTER — Encounter: Payer: Self-pay | Admitting: Gastroenterology

## 2020-04-30 VITALS — BP 131/80 | HR 74 | Temp 98.6°F | Ht 70.0 in | Wt 171.2 lb

## 2020-04-30 DIAGNOSIS — K5900 Constipation, unspecified: Secondary | ICD-10-CM

## 2020-04-30 DIAGNOSIS — I25119 Atherosclerotic heart disease of native coronary artery with unspecified angina pectoris: Secondary | ICD-10-CM | POA: Diagnosis not present

## 2020-04-30 NOTE — Progress Notes (Signed)
Referring Provider: Abran Richard, MD Primary Care Physician:  Abran Richard, MD Primary GI: Dr. Gala Romney   Chief Complaint  Patient presents with  . Follow-up    doing well on linzess    HPI:   Peter Campbell is a 64 y.o. male presenting today with a history of chronic opioid-induced constipation, doing well on Linzess 290 mcg daily. Due for surveillance colonoscopy in 2023. Here for routine follow-up.   He had a lumbar fusion in March 2021, with pain medications thereafter affecting bowel habits. He has since had this decreased with good results and back to his baseline. Doing very well on Linzess 290 mcg daily. Seen in ED July 2021 with back pain. CT abd/pelvis with contrast without acute GI abnormalities. No overt GI bleeding. No GERD or dysphagia. Doing well.   Past Medical History:  Diagnosis Date  . Arthritis   . Chronic back pain   . Collagen vascular disease ([place])   . Colonic mass    History, s/p removal; per patient.  . Constipation   . COPD (chronic obstructive pulmonary disease) ([place])   . Coronary atherosclerosis of native coronary artery    Dense coronary atherosclerosis by chest CT November 2014   . Dyspnea    when walking distance  . Essential hypertension   . Essential tremor   . History of chicken pox   . History of [place] measles   . Pneumonia    2015 ish  . Sciatica     Past Surgical History:  Procedure Laterality Date  . CIRCUMCISION    . COLON SURGERY  '80's   for mass removal, colonostomy  . COLONOSCOPY WITH PROPOFOL N/A 05/03/2017   Two 3-5 mm polyps in rectum and descending colon s/p removal, pancolonic diverticulosis, internal hemorrhoids. Benign. Surveillance due in 5 years.   . colonostomy reversal  80's  . LUMBAR FUSION  11/27/2019  . Lumber discectomy    . Middle finger Right    fracture  . NASAL SEPTUM SURGERY    . POLYPECTOMY  05/03/2017   Procedure: POLYPECTOMY;  Surgeon: Daneil Dolin, MD;  [facility];  Service:  Endoscopy;;  descending colon and rectal  . SHOULDER ACROMIOPLASTY Right 06/12/2014   Procedure: RIGHT SHOULDER ACROMIOPLASTY;  Surgeon: [name], MD;  Location: [facility];  Service: Orthopedics;  Laterality: Right;  . SHOULDER OPEN ROTATOR CUFF REPAIR Right 06/12/2014   Procedure: OPEN REPAIR ROTATOR CUFF RIGHT ;  Surgeon: [name], MD;  Location: [facility];  Service: Orthopedics;  Laterality: Right;  . TONSILLECTOMY    . TYMPANOSTOMY TUBE PLACEMENT      Current Outpatient Medications  Medication Sig Dispense Refill  . albuterol (PROVENTIL HFA;VENTOLIN HFA) 108 (90 Base) MCG/ACT inhaler Inhale 1-2 puffs into the lungs every 6 (six) hours as needed for wheezing or shortness of breath.    Marland Kitchen amLODipine (NORVASC) 5 MG tablet TAKE 1 TABLET BY MOUTH ONCE DAILY. (Patient taking differently: Take 5 mg by mouth in the morning. ) 90 tablet 3  . aspirin EC 81 MG tablet Take 1 tablet (81 mg total) by mouth daily. 90 tablet 3  . gabapentin (NEURONTIN) 600 MG tablet Take 600 mg by mouth 3 (three) times daily.     Marland Kitchen ipratropium-albuterol (DUONEB) 0.5-2.5 (3) MG/3ML SOLN Take 3 mLs by nebulization every 6 (six) hours as needed. (Patient taking differently: Take 3 mLs by nebulization every 6 (six) hours as needed (shortness of breath). ) 360 mL  0  . linaclotide (LINZESS) 290 MCG CAPS capsule Take 1 capsule (290 mcg total) by mouth daily before breakfast. 90 capsule 3  . metoprolol succinate (TOPROL-XL) 25 MG 24 hr tablet Take 2 tablets (50 mg total) by mouth every morning. & 25 mg by mouth in the evening (Patient taking differently: Take 25-50 mg by mouth See admin instructions. Take 50 mg by mouth in the morning and 25 mg after supper) 270 tablet 3  . naloxone (NARCAN) 4 MG/0.1ML LIQD nasal spray kit Place 1 spray into the nose as needed (as directed).    . naproxen (NAPROSYN) 500 MG tablet Take 500 mg by mouth 2 (two) times daily after a meal.    . nitroGLYCERIN (NITROSTAT) 0.4 MG SL tablet Place 1 tablet  (0.4 mg total) under the tongue every 5 (five) minutesas needed for chest pain. 25 tablet 3  . oxyCODONE-acetaminophen (PERCOCET) 7.5-325 MG tablet Take 1 tablet by mouth 4 (four) times daily.     . temazepam (RESTORIL) 30 MG capsule Take 60 mg by mouth at bedtime.    Marland Kitchen XTAMPZA ER 13.5 MG C12A Take 1 capsule by mouth 2 (two) times daily.     No current facility-administered medications for this visit.    Allergies as of 04/30/2020 - Review Complete 04/16/2020  Allergen Reaction Noted  . Fentanyl Other (See Comments) 05/16/2019    Family History  Problem Relation Age of Onset  . COPD Father   . Cancer Father   . Cancer Mother   . Heart attack Brother   . Epilepsy Sister   . Hypertension Sister   . Colon cancer Neg Hx     Social History   Socioeconomic History  . Marital status: Widowed    Spouse name: Not on file  . Number of children: Not on file  . Years of education: Not on file  . Highest education level: Not on file  Occupational History  . Occupation: Disabled  Tobacco Use  . Smoking status: Current Every Day Smoker    Packs/day: 1.50    Years: 30.00    Pack years: 45.00    Types: Cigarettes    Start date: 08/28/1969  . Smokeless tobacco: Never Used  Vaping Use  . Vaping Use: Never used  Substance and Sexual Activity  . Alcohol use: No    Alcohol/week: 0.0 standard drinks  . Drug use: Yes    Types: Marijuana, Other-see comments    Comment: Pt uses Xanax- prn  . Sexual activity: Not on file  Other Topics Concern  . Not on file  Social History Narrative   Pt lives alone in [place]; on disability   Social Determinants of Health   Financial Resource Strain:   . Difficulty of Paying Living Expenses:   Food Insecurity:   . Worried About Charity fundraiser in the Last Year:   . Arboriculturist in the Last Year:   Transportation Needs:   . Film/video editor (Medical):   Marland Kitchen Lack of Transportation (Non-Medical):   Physical Activity:   . Days of  Exercise per Week:   . Minutes of Exercise per Session:   Stress:   . Feeling of Stress :   Social Connections:   . Frequency of Communication with Friends and Family:   . Frequency of Social Gatherings with Friends and Family:   . Attends Religious Services:   . Active Member of Clubs or Organizations:   . Attends Archivist Meetings:   .  Marital Status:     Review of Systems: Gen: Denies fever, chills, anorexia. Denies fatigue, weakness, weight loss.  CV: Denies chest pain, palpitations, syncope, peripheral edema, and claudication. Resp: Denies dyspnea at rest, cough, wheezing, coughing up blood, and pleurisy. GI: see HPI Derm: Denies rash, itching, dry skin Psych: Denies depression, anxiety, memory loss, confusion. No homicidal or suicidal ideation.  Heme: Denies bruising, bleeding, and enlarged lymph nodes.  Physical Exam: BP 131/80   Pulse 74   Temp 98.6 F (37 C) (Oral)   Ht _0  (1.778 m)   Wt 171 lb 3.2 oz (77.7 kg)   BMI 24.56 kg/m  General:   Alert and oriented. No distress noted. Pleasant and cooperative.  Head:  Normocephalic and atraumatic. Eyes:  Conjuctiva clear without scleral icterus. Mouth:  Mask in place  Abdomen:  +BS, soft, non-tender and non-distended. No rebound or guarding. No HSM or masses noted. Msk:  Symmetrical without gross deformities. Normal posture. Extremities:  Without edema. Neurologic:  Alert and  oriented x4 Psych:  Alert and cooperative. Normal mood and affect.  ASSESSMENT: Peter Campbell is a 64 y.o. male presenting today with history of chronic opioid-induced constipation that is overall well-controlled with Linzess 290 mcg daily. Brief episode of worsening constipation in setting of escalated narcotic therapy now resolved. Plans for surveillance colonoscopy in 2023.    PLAN:  Continue Linzess 290 mcg daily  Colonoscopy in 2023  Call if any recurrent constipation   Annitta Needs, PhD, [facility]  Gastroenterology

## 2020-04-30 NOTE — Patient Instructions (Signed)
We will see you in 1 year!  Please call if any further bouts with constipation.  It was a pleasure to see you today. I want to create trusting relationships with patients to provide genuine, compassionate, and quality care. I value your feedback. If you receive a survey regarding your visit,  I greatly appreciate you taking time to fill this out.   Gelene Mink, PhD, ANP-BC [facility] Gastroenterology

## 2020-05-14 ENCOUNTER — Ambulatory Visit (INDEPENDENT_AMBULATORY_CARE_PROVIDER_SITE_OTHER): Payer: Medicare Other | Admitting: Orthopaedic Surgery

## 2020-05-14 ENCOUNTER — Other Ambulatory Visit: Payer: Self-pay

## 2020-05-14 ENCOUNTER — Encounter: Payer: Self-pay | Admitting: Orthopaedic Surgery

## 2020-05-14 VITALS — Ht 70.0 in | Wt 172.0 lb

## 2020-05-14 DIAGNOSIS — G8929 Other chronic pain: Secondary | ICD-10-CM | POA: Diagnosis not present

## 2020-05-14 DIAGNOSIS — M25511 Pain in right shoulder: Secondary | ICD-10-CM

## 2020-05-14 DIAGNOSIS — F17210 Nicotine dependence, cigarettes, uncomplicated: Secondary | ICD-10-CM | POA: Diagnosis not present

## 2020-05-14 NOTE — Progress Notes (Signed)
PROCEDURE NOTE:  The patient request injection, verbal consent was obtained.  The right shoulder was prepped appropriately after time out was performed.   Sterile technique was observed and injection of 1 cc of Depo-Medrol 40 mg with several cc's of plain xylocaine. Anesthesia was provided by ethyl chloride and a 20-gauge needle was used to inject the shoulder area. A posterior approach was used.  The injection was tolerated well.  A band aid dressing was applied.  The patient was advised to apply ice later today and tomorrow to the injection sight as needed.  Return in one month.  Call if any problem.  Precautions discussed.   Electronically Signed Darreld Mclean, MD 8/17/20212:04 PM

## 2020-05-17 ENCOUNTER — Other Ambulatory Visit: Payer: Self-pay | Admitting: Orthopedic Surgery

## 2020-06-06 ENCOUNTER — Other Ambulatory Visit: Payer: Self-pay | Admitting: Cardiology

## 2020-06-11 ENCOUNTER — Ambulatory Visit (INDEPENDENT_AMBULATORY_CARE_PROVIDER_SITE_OTHER): Payer: Medicare Other | Admitting: Orthopaedic Surgery

## 2020-06-11 ENCOUNTER — Other Ambulatory Visit: Payer: Self-pay

## 2020-06-11 ENCOUNTER — Encounter: Payer: Self-pay | Admitting: Orthopaedic Surgery

## 2020-06-11 DIAGNOSIS — M25511 Pain in right shoulder: Secondary | ICD-10-CM

## 2020-06-11 DIAGNOSIS — G8929 Other chronic pain: Secondary | ICD-10-CM | POA: Diagnosis not present

## 2020-06-11 DIAGNOSIS — F17210 Nicotine dependence, cigarettes, uncomplicated: Secondary | ICD-10-CM | POA: Diagnosis not present

## 2020-06-11 NOTE — Patient Instructions (Signed)
Steps to Quit Smoking Smoking tobacco is the leading cause of preventable death. It can affect almost every organ in the body. Smoking puts you and people around you at risk for many serious, long-lasting (chronic) diseases. Quitting smoking can be hard, but it is one of the best things that you can do for your health. It is never too late to quit. How do I get ready to quit? When you decide to quit smoking, make a plan to help you succeed. Before you quit:  Pick a date to quit. Set a date within the next 2 weeks to give you time to prepare.  Write down the reasons why you are quitting. Keep this list in places where you will see it often.  Tell your family, friends, and co-workers that you are quitting. Their support is important.  Talk with your doctor about the choices that may help you quit.  Find out if your health insurance will pay for these treatments.  Know the people, places, things, and activities that make you want to smoke (triggers). Avoid them. What first steps can I take to quit smoking?  Throw away all cigarettes at home, at work, and in your car.  Throw away the things that you use when you smoke, such as ashtrays and lighters.  Clean your car. Make sure to empty the ashtray.  Clean your home, including curtains and carpets. What can I do to help me quit smoking? Talk with your doctor about taking medicines and seeing a counselor at the same time. You are more likely to succeed when you do both.  If you are pregnant or breastfeeding, talk with your doctor about counseling or other ways to quit smoking. Do not take medicine to help you quit smoking unless your doctor tells you to do so. To quit smoking: Quit right away  Quit smoking totally, instead of slowly cutting back on how much you smoke over a period of time.  Go to counseling. You are more likely to quit if you go to counseling sessions regularly. Take medicine You may take medicines to help you quit. Some  medicines need a prescription, and some you can buy over-the-counter. Some medicines may contain a drug called nicotine to replace the nicotine in cigarettes. Medicines may:  Help you to stop having the desire to smoke (cravings).  Help to stop the problems that come when you stop smoking (withdrawal symptoms). Your doctor may ask you to use:  Nicotine patches, gum, or lozenges.  Nicotine inhalers or sprays.  Non-nicotine medicine that is taken by mouth. Find resources Find resources and other ways to help you quit smoking and remain smoke-free after you quit. These resources are most helpful when you use them often. They include:  Online chats with a counselor.  Phone quitlines.  Printed self-help materials.  Support groups or group counseling.  Text messaging programs.  Mobile phone apps. Use apps on your mobile phone or tablet that can help you stick to your quit plan. There are many free apps for mobile phones and tablets as well as websites. Examples include Quit Guide from the CDC and smokefree.gov  What things can I do to make it easier to quit?   Talk to your family and friends. Ask them to support and encourage you.  Call a phone quitline (1-800-QUIT-NOW), reach out to support groups, or work with a counselor.  Ask people who smoke to not smoke around you.  Avoid places that make you want to smoke,   such as: ? Bars. ? Parties. ? Smoke-break areas at work.  Spend time with people who do not smoke.  Lower the stress in your life. Stress can make you want to smoke. Try these things to help your stress: ? Getting regular exercise. ? Doing deep-breathing exercises. ? Doing yoga. ? Meditating. ? Doing a body scan. To do this, close your eyes, focus on one area of your body at a time from head to toe. Notice which parts of your body are tense. Try to relax the muscles in those areas. How will I feel when I quit smoking? Day 1 to 3 weeks Within the first 24 hours,  you may start to have some problems that come from quitting tobacco. These problems are very bad 2-3 days after you quit, but they do not often last for more than 2-3 weeks. You may get these symptoms:  Mood swings.  Feeling restless, nervous, angry, or annoyed.  Trouble concentrating.  Dizziness.  Strong desire for high-sugar foods and nicotine.  Weight gain.  Trouble pooping (constipation).  Feeling like you may vomit (nausea).  Coughing or a sore throat.  Changes in how the medicines that you take for other issues work in your body.  Depression.  Trouble sleeping (insomnia). Week 3 and afterward After the first 2-3 weeks of quitting, you may start to notice more positive results, such as:  Better sense of smell and taste.  Less coughing and sore throat.  Slower heart rate.  Lower blood pressure.  Clearer skin.  Better breathing.  Fewer sick days. Quitting smoking can be hard. Do not give up if you fail the first time. Some people need to try a few times before they succeed. Do your best to stick to your quit plan, and talk with your doctor if you have any questions or concerns. Summary  Smoking tobacco is the leading cause of preventable death. Quitting smoking can be hard, but it is one of the best things that you can do for your health.  When you decide to quit smoking, make a plan to help you succeed.  Quit smoking right away, not slowly over a period of time.  When you start quitting, seek help from your doctor, family, or friends. This information is not intended to replace advice given to you by your health care provider. Make sure you discuss any questions you have with your health care provider. Document Revised: 06/09/2019 Document Reviewed: 12/03/2018 Elsevier Patient Education  2020 Elsevier Inc.  

## 2020-06-11 NOTE — Progress Notes (Signed)
PROCEDURE NOTE:  The patient request injection, verbal consent was obtained.  The right shoulder was prepped appropriately after time out was performed.   Sterile technique was observed and injection of 1 cc of Depo-Medrol 40 mg with several cc's of plain xylocaine. Anesthesia was provided by ethyl chloride and a 20-gauge needle was used to inject the shoulder area. A posterior approach was used.  The injection was tolerated well.  A band aid dressing was applied.  The patient was advised to apply ice later today and tomorrow to the injection sight as needed.  Return in one month.  Electronically Signed Darreld Mclean, MD 9/14/20212:28 PM

## 2020-07-09 ENCOUNTER — Other Ambulatory Visit: Payer: Self-pay

## 2020-07-09 ENCOUNTER — Ambulatory Visit (INDEPENDENT_AMBULATORY_CARE_PROVIDER_SITE_OTHER): Payer: Medicare Other | Admitting: Orthopaedic Surgery

## 2020-07-09 ENCOUNTER — Encounter: Payer: Self-pay | Admitting: Orthopaedic Surgery

## 2020-07-09 DIAGNOSIS — G8929 Other chronic pain: Secondary | ICD-10-CM

## 2020-07-09 DIAGNOSIS — F17210 Nicotine dependence, cigarettes, uncomplicated: Secondary | ICD-10-CM | POA: Diagnosis not present

## 2020-07-09 DIAGNOSIS — M25511 Pain in right shoulder: Secondary | ICD-10-CM

## 2020-07-09 NOTE — Progress Notes (Signed)
PROCEDURE NOTE:  The patient request injection, verbal consent was obtained.  The right shoulder was prepped appropriately after time out was performed.   Sterile technique was observed and injection of 1 cc of Depo-Medrol 40 mg with several cc's of plain xylocaine. Anesthesia was provided by ethyl chloride and a 20-gauge needle was used to inject the shoulder area. A posterior approach was used.  The injection was tolerated well.  A band aid dressing was applied.  The patient was advised to apply ice later today and tomorrow to the injection sight as needed.  Return in one month.  Electronically Signed Darreld Mclean, MD 10/12/20212:49 PM

## 2020-07-25 ENCOUNTER — Other Ambulatory Visit: Payer: Self-pay | Admitting: Cardiology

## 2020-08-06 ENCOUNTER — Other Ambulatory Visit: Payer: Self-pay

## 2020-08-06 ENCOUNTER — Ambulatory Visit (INDEPENDENT_AMBULATORY_CARE_PROVIDER_SITE_OTHER): Payer: Medicare Other | Admitting: Orthopaedic Surgery

## 2020-08-06 ENCOUNTER — Encounter: Payer: Self-pay | Admitting: Orthopaedic Surgery

## 2020-08-06 VITALS — BP 132/87 | HR 104 | Ht 70.0 in | Wt 170.0 lb

## 2020-08-06 DIAGNOSIS — F17210 Nicotine dependence, cigarettes, uncomplicated: Secondary | ICD-10-CM

## 2020-08-06 DIAGNOSIS — M25511 Pain in right shoulder: Secondary | ICD-10-CM

## 2020-08-06 DIAGNOSIS — G8929 Other chronic pain: Secondary | ICD-10-CM | POA: Diagnosis not present

## 2020-08-06 NOTE — Progress Notes (Signed)
PROCEDURE NOTE:  The patient request injection, verbal consent was obtained.  The right shoulder was prepped appropriately after time out was performed.   Sterile technique was observed and injection of 1 cc of Depo-Medrol 40 mg with several cc's of plain xylocaine. Anesthesia was provided by ethyl chloride and a 20-gauge needle was used to inject the shoulder area. A posterior approach was used.  The injection was tolerated well.  A band aid dressing was applied.  The patient was advised to apply ice later today and tomorrow to the injection sight as needed.  Return in one month.  Electronically Signed Darreld Mclean, MD 11/9/20212:22 PM

## 2020-08-12 ENCOUNTER — Encounter: Payer: Self-pay | Admitting: Internal Medicine

## 2020-09-03 ENCOUNTER — Ambulatory Visit: Payer: Medicare Other | Admitting: Orthopaedic Surgery

## 2020-09-05 ENCOUNTER — Encounter: Payer: Self-pay | Admitting: Orthopaedic Surgery

## 2020-09-05 ENCOUNTER — Other Ambulatory Visit: Payer: Self-pay

## 2020-09-05 ENCOUNTER — Ambulatory Visit (INDEPENDENT_AMBULATORY_CARE_PROVIDER_SITE_OTHER): Payer: Medicare Other | Admitting: Orthopaedic Surgery

## 2020-09-05 DIAGNOSIS — F17210 Nicotine dependence, cigarettes, uncomplicated: Secondary | ICD-10-CM | POA: Diagnosis not present

## 2020-09-05 DIAGNOSIS — G8929 Other chronic pain: Secondary | ICD-10-CM

## 2020-09-05 DIAGNOSIS — M25511 Pain in right shoulder: Secondary | ICD-10-CM

## 2020-09-05 NOTE — Progress Notes (Signed)
PROCEDURE NOTE:  The patient request injection, verbal consent was obtained.  The right shoulder was prepped appropriately after time out was performed.   Sterile technique was observed and injection of 1 cc of Depo-Medrol 40 mg with several cc's of plain xylocaine. Anesthesia was provided by ethyl chloride and a 20-gauge needle was used to inject the shoulder area. A posterior approach was used.  The injection was tolerated well.  A band aid dressing was applied.  The patient was advised to apply ice later today and tomorrow to the injection sight as needed.  Return in one month.  Electronically Signed Darreld Mclean, MD 12/9/202110:11 AM

## 2020-09-09 NOTE — Progress Notes (Signed)
Cardiology Office Note  Date: 09/10/2020   ID: Peter Campbell, DOB Jul 26, 1956, MRN [id number]  PCP:  Abran Richard, MD  Cardiologist:  Rozann Lesches, MD Electrophysiologist:  None   Chief Complaint  Patient presents with  . Cardiac follow-up    History of Present Illness: Peter Campbell is a 64 y.o. male last assessed via telehealth encounter in June.  He presents for a routine visit.  Still enjoys getting out and playing music when he can, has been traveling to visit family members during the holiday season.  He does not describe any angina symptoms, no worsening shortness of breath.  I reviewed his medications which are outlined below and stable from a cardiac perspective.  He did have interval lab work with PCP which we are requesting.  He does not report any nitroglycerin use.  I personally reviewed his ECG today which shows normal sinus rhythm.  Past Medical History:  Diagnosis Date  . Arthritis   . Chronic back pain   . Collagen vascular disease ([place])   . Colonic mass    History, s/p removal; per patient.  . Constipation   . COPD (chronic obstructive pulmonary disease) ([place])   . Coronary atherosclerosis of native coronary artery    Dense coronary atherosclerosis by chest CT November 2014   . Dyspnea    when walking distance  . Essential hypertension   . Essential tremor   . History of chicken pox   . History of [place] measles   . Pneumonia    2015 ish  . Sciatica     Past Surgical History:  Procedure Laterality Date  . CIRCUMCISION    . COLON SURGERY  '80's   for mass removal, colonostomy  . COLONOSCOPY WITH PROPOFOL N/A 05/03/2017   Two 3-5 mm polyps in rectum and descending colon s/p removal, pancolonic diverticulosis, internal hemorrhoids. Benign. Surveillance due in 5 years.   . colonostomy reversal  80's  . LUMBAR FUSION  11/27/2019  . Lumber discectomy    . Middle finger Right    fracture  . NASAL SEPTUM SURGERY    . POLYPECTOMY  05/03/2017    Procedure: POLYPECTOMY;  Surgeon: Daneil Dolin, MD;  [facility];  Service: Endoscopy;;  descending colon and rectal  . SHOULDER ACROMIOPLASTY Right 06/12/2014   Procedure: RIGHT SHOULDER ACROMIOPLASTY;  Surgeon: [name], MD;  Location: [facility];  Service: Orthopedics;  Laterality: Right;  . SHOULDER OPEN ROTATOR CUFF REPAIR Right 06/12/2014   Procedure: OPEN REPAIR ROTATOR CUFF RIGHT ;  Surgeon: [name], MD;  Location: [facility];  Service: Orthopedics;  Laterality: Right;  . TONSILLECTOMY    . TYMPANOSTOMY TUBE PLACEMENT      Current Outpatient Medications  Medication Sig Dispense Refill  . albuterol (PROVENTIL HFA;VENTOLIN HFA) 108 (90 Base) MCG/ACT inhaler Inhale 1-2 puffs into the lungs every 6 (six) hours as needed for wheezing or shortness of breath.    Marland Kitchen amLODipine (NORVASC) 5 MG tablet Take 1 tablet (5 mg total) by mouth in the morning. 90 tablet 1  . aspirin EC 81 MG tablet Take 1 tablet (81 mg total) by mouth daily. 90 tablet 3  . gabapentin (NEURONTIN) 600 MG tablet Take 600 mg by mouth 3 (three) times daily.     Marland Kitchen ipratropium-albuterol (DUONEB) 0.5-2.5 (3) MG/3ML SOLN Take 3 mLs by nebulization every 6 (six) hours as needed. (Patient taking differently: Take 3 mLs by nebulization every 6 (six) hours as needed (shortness of  breath).) 360 mL 0  . linaclotide (LINZESS) 290 MCG CAPS capsule Take 1 capsule (290 mcg total) by mouth daily before breakfast. 90 capsule 3  . metoprolol succinate (TOPROL-XL) 25 MG 24 hr tablet TAKE 2 TABLETS BY MOUTH IN THE MORNING AND 1 TABLET BY MOUTH IN THE EVENING. 270 tablet 0  . naloxone (NARCAN) 4 MG/0.1ML LIQD nasal spray kit Place 1 spray into the nose as needed (as directed).    . naproxen (NAPROSYN) 500 MG tablet TAKE (1) TABLET TWICE A DAY WITH FOOD---BREAKFAST AND SUPPER. 180 tablet 0  . nitroGLYCERIN (NITROSTAT) 0.4 MG SL tablet Place 1 tablet (0.4 mg total) under the tongue every 5 (five) minutesas needed for chest pain. 25  tablet 3  . oxyCODONE-acetaminophen (PERCOCET) 7.5-325 MG tablet Take 1 tablet by mouth 4 (four) times daily.     . temazepam (RESTORIL) 30 MG capsule Take 60 mg by mouth at bedtime.    Marland Kitchen XTAMPZA ER 13.5 MG C12A Take 1 capsule by mouth 2 (two) times daily.     No current facility-administered medications for this visit.   Allergies:  Fentanyl   ROS: No palpitations or syncope.  Physical Exam: VS:  BP 130/86   Pulse 78   Ht _0  (1.778 m)   Wt 175 lb 9.6 oz (79.7 kg)   SpO2 94%   BMI 25.20 kg/m , BMI Body mass index is 25.2 kg/m.  Wt Readings from Last 3 Encounters:  09/10/20 175 lb 9.6 oz (79.7 kg)  08/06/20 170 lb (77.1 kg)  05/14/20 172 lb (78 kg)    General: Patient appears comfortable at rest. HEENT: Conjunctiva and lids normal, wearing a mask. Neck: Supple, no elevated JVP or carotid bruits, no thyromegaly. Lungs: Clear to auscultation, nonlabored breathing at rest. Cardiac: Regular rate and rhythm, no S3, soft systolic murmur, no pericardial rub. Extremities: No pitting edema.  ECG:  An ECG dated 05/18/2019 was personally reviewed today and demonstrated:  Normal sinus rhythm with vertical axis.  Recent Labwork: 03/29/2020: ALT 17; AST 13; BUN 20; Creatinine, Ser 0.80; Hemoglobin 16.6; Platelets 249; Potassium 4.1; Sodium 137   Other Studies Reviewed Today:  Lexiscan Myoview 07/08/2017:  The study is normal.  This is a low risk study.  The left ventricular ejection fraction is normal (55-65%).  There was no ST segment deviation noted during stress.  Diaphragmatic attenuation no ischemia. SDS 1. Normal wall motion EF 60%  Assessment and Plan:  1.  Coronary artery calcification by CT imaging.  No angina symptoms reported on medical therapy and ECG normal today.  Myoview in 2018 was low risk.  Continue aspirin, Norvasc, Toprol-XL, and as needed nitroglycerin.  Requesting interval lab work from PCP.  2.  Essential hypertension, blood pressure control is  adequate today.  Medication Adjustments/Labs and Tests Ordered: Current medicines are reviewed at length with the patient today.  Concerns regarding medicines are outlined above.   Tests Ordered: Orders Placed This Encounter  Procedures  . EKG 12-Lead    Medication Changes: No orders of the defined types were placed in this encounter.   Disposition:  Follow up 6 months in the [place] office.  Signed, Satira Sark, MD, [facility] 09/10/2020 10:25 AM    [facility], [place], [place] [address] Phone: [phone]; Fax: [phone]

## 2020-09-10 ENCOUNTER — Ambulatory Visit (INDEPENDENT_AMBULATORY_CARE_PROVIDER_SITE_OTHER): Payer: Medicare Other | Admitting: Cardiology

## 2020-09-10 ENCOUNTER — Encounter: Payer: Self-pay | Admitting: *Deleted

## 2020-09-10 ENCOUNTER — Encounter: Payer: Self-pay | Admitting: Cardiology

## 2020-09-10 VITALS — BP 130/86 | HR 78 | Ht 70.0 in | Wt 175.6 lb

## 2020-09-10 DIAGNOSIS — I10 Essential (primary) hypertension: Secondary | ICD-10-CM

## 2020-09-10 DIAGNOSIS — I25119 Atherosclerotic heart disease of native coronary artery with unspecified angina pectoris: Secondary | ICD-10-CM

## 2020-09-10 NOTE — Patient Instructions (Addendum)

## 2020-10-03 ENCOUNTER — Ambulatory Visit (INDEPENDENT_AMBULATORY_CARE_PROVIDER_SITE_OTHER): Payer: Medicare Other | Admitting: Orthopaedic Surgery

## 2020-10-03 ENCOUNTER — Encounter: Payer: Self-pay | Admitting: Orthopaedic Surgery

## 2020-10-03 ENCOUNTER — Telehealth: Payer: Self-pay | Admitting: Internal Medicine

## 2020-10-03 ENCOUNTER — Other Ambulatory Visit: Payer: Self-pay

## 2020-10-03 DIAGNOSIS — M25511 Pain in right shoulder: Secondary | ICD-10-CM | POA: Diagnosis not present

## 2020-10-03 DIAGNOSIS — G8929 Other chronic pain: Secondary | ICD-10-CM

## 2020-10-03 DIAGNOSIS — F17210 Nicotine dependence, cigarettes, uncomplicated: Secondary | ICD-10-CM | POA: Diagnosis not present

## 2020-10-03 NOTE — Telephone Encounter (Signed)
Noted. Will call pt when samples are available.

## 2020-10-03 NOTE — Telephone Encounter (Signed)
PLEASE CALL PATIENT WHEN WE HAVE LINZESS 290 SAMPLES....[phone]

## 2020-10-03 NOTE — Progress Notes (Signed)
PROCEDURE NOTE:  The patient request injection, verbal consent was obtained.  The right shoulder was prepped appropriately after time out was performed.   Sterile technique was observed and injection of 1 cc of Depo-Medrol 40 mg with several cc's of plain xylocaine. Anesthesia was provided by ethyl chloride and a 20-gauge needle was used to inject the shoulder area. A posterior approach was used.  The injection was tolerated well.  A band aid dressing was applied.  The patient was advised to apply ice later today and tomorrow to the injection sight as needed.  Return in one month.  Electronically Signed Darreld Mclean, MD 1/6/202210:26 AM

## 2020-10-04 NOTE — Telephone Encounter (Signed)
Spoke with pt. Samples are available now. Pt isn't able to pick samples up today and discussed he has an appointment on Tuesday. Pt is aware that samples will be ready on Tuesday at his appointment .

## 2020-10-08 ENCOUNTER — Ambulatory Visit (INDEPENDENT_AMBULATORY_CARE_PROVIDER_SITE_OTHER): Payer: Medicare Other | Admitting: Internal Medicine

## 2020-10-08 ENCOUNTER — Other Ambulatory Visit: Payer: Self-pay

## 2020-10-08 ENCOUNTER — Encounter: Payer: Self-pay | Admitting: Internal Medicine

## 2020-10-08 VITALS — BP 138/79 | HR 90 | Temp 97.1°F | Ht 70.0 in | Wt 176.8 lb

## 2020-10-08 DIAGNOSIS — K5900 Constipation, unspecified: Secondary | ICD-10-CM

## 2020-10-08 DIAGNOSIS — Z86010 Personal history of colonic polyps: Secondary | ICD-10-CM | POA: Diagnosis not present

## 2020-10-08 NOTE — Progress Notes (Signed)
done

## 2020-10-08 NOTE — Progress Notes (Signed)
Primary Care Physician:  Abran Richard, MD Primary Gastroenterologist:  Dr. Gala Romney  Pre-Procedure History & Physical: HPI:  Peter Campbell is a 65 y.o. male here for follow-up of opioid-induced constipation.  Since last being seen, Percocet regimen was increased to 7.5 mg 4 times daily.  He also is taking Xtampza ER under the direction of the [facility].  Taking Linzess 290 once daily is effective in moving his bowels most days;   if he eats too much meat he may have trouble and takes an extra Linzess to facilitate function.  Has not had any bleeding.  History of colonic resection for benign tumors previously had a colostomy which was subsequently taken down.  Colonoscopy 2018 yielded small adenomas and diverticulosis; he is due for surveillance colonoscopy 2023  Past Medical History:  Diagnosis Date  . Arthritis   . Chronic back pain   . Collagen vascular disease ([place])   . Colonic mass    History, s/p removal; per patient.  . Constipation   . COPD (chronic obstructive pulmonary disease) ([place])   . Coronary atherosclerosis of native coronary artery    Dense coronary atherosclerosis by chest CT November 2014   . Dyspnea    when walking distance  . Essential hypertension   . Essential tremor   . History of chicken pox   . History of [place] measles   . Pneumonia    2015 ish  . Sciatica     Past Surgical History:  Procedure Laterality Date  . CIRCUMCISION    . COLON SURGERY  '80's   for mass removal, colonostomy  . COLONOSCOPY WITH PROPOFOL N/A 05/03/2017   Two 3-5 mm polyps in rectum and descending colon s/p removal, pancolonic diverticulosis, internal hemorrhoids. Benign. Surveillance due in 5 years.   . colonostomy reversal  80's  . LUMBAR FUSION  11/27/2019  . Lumber discectomy    . Middle finger Right    fracture  . NASAL SEPTUM SURGERY    . POLYPECTOMY  05/03/2017   Procedure: POLYPECTOMY;  Surgeon: Daneil Dolin, MD;  [facility];  Service:  Endoscopy;;  descending colon and rectal  . SHOULDER ACROMIOPLASTY Right 06/12/2014   Procedure: RIGHT SHOULDER ACROMIOPLASTY;  Surgeon: [name], MD;  Location: [facility];  Service: Orthopedics;  Laterality: Right;  . SHOULDER OPEN ROTATOR CUFF REPAIR Right 06/12/2014   Procedure: OPEN REPAIR ROTATOR CUFF RIGHT ;  Surgeon: [name], MD;  Location: [facility];  Service: Orthopedics;  Laterality: Right;  . TONSILLECTOMY    . TYMPANOSTOMY TUBE PLACEMENT      Prior to Admission medications   Medication Sig Start Date End Date Taking? Authorizing Provider  albuterol (PROVENTIL HFA;VENTOLIN HFA) 108 (90 Base) MCG/ACT inhaler Inhale 1-2 puffs into the lungs every 6 (six) hours as needed for wheezing or shortness of breath.   Yes [provider]  amLODipine (NORVASC) 5 MG tablet Take 1 tablet (5 mg total) by mouth in the morning. 07/25/20  Yes Satira Sark, MD  aspirin EC 81 MG tablet Take 1 tablet (81 mg total) by mouth daily. 10/31/13  Yes Satira Sark, MD  gabapentin (NEURONTIN) 600 MG tablet Take 600 mg by mouth 3 (three) times daily.  01/26/18  Yes [provider]  ipratropium-albuterol (DUONEB) 0.5-2.5 (3) MG/3ML SOLN Take 3 mLs by nebulization every 6 (six) hours as needed. Patient taking differently: Take 3 mLs by nebulization every 6 (six) hours as needed (shortness of breath). 06/19/15  Yes Veryl Speak, MD  linaclotide [name]) 290 MCG CAPS capsule Take 1 capsule (290 mcg total) by mouth daily before breakfast. Patient taking differently: Take 290 mcg by mouth daily before breakfast. Sometimes takes 2 capsules 03/26/20  Yes Annitta Needs, NP  metoprolol succinate (TOPROL-XL) 25 MG 24 hr tablet TAKE 2 TABLETS BY MOUTH IN THE MORNING AND 1 TABLET BY MOUTH IN THE EVENING. 06/06/20  Yes Satira Sark, MD  naloxone [facility]) 4 MG/0.1ML LIQD nasal spray kit Place 1 spray into the nose as needed (as directed).   Yes [provider]  naproxen (NAPROSYN) 500 MG  tablet TAKE (1) TABLET TWICE A DAY WITH FOOD---BREAKFAST AND SUPPER. 05/21/20  Yes Carole Civil, MD  nitroGLYCERIN (NITROSTAT) 0.4 MG SL tablet Place 1 tablet (0.4 mg total) under the tongue every 5 (five) minutesas needed for chest pain. 03/07/20  Yes Satira Sark, MD  oxyCODONE-acetaminophen (PERCOCET) 7.5-325 MG tablet Take 1 tablet by mouth 4 (four) times daily.  02/16/20  Yes [provider]  temazepam (RESTORIL) 30 MG capsule Take 60 mg by mouth at bedtime.   Yes [provider]  XTAMPZA ER 13.5 MG C12A Take 1 capsule by mouth 2 (two) times daily. 04/03/20  Yes [provider]    Allergies as of 10/08/2020 - Review Complete 10/08/2020  Allergen Reaction Noted  . Fentanyl Other (See Comments) 05/16/2019    Family History  Problem Relation Age of Onset  . COPD Father   . Cancer Father   . Cancer Mother   . Heart attack Brother   . Epilepsy Sister   . Hypertension Sister   . Colon cancer Neg Hx     Social History   Socioeconomic History  . Marital status: Widowed    Spouse name: Not on file  . Number of children: Not on file  . Years of education: Not on file  . Highest education level: Not on file  Occupational History  . Occupation: Disabled  Tobacco Use  . Smoking status: Current Every Day Smoker    Packs/day: 1.00    Years: 30.00    Pack years: 30.00    Types: Cigarettes    Start date: 08/28/1969  . Smokeless tobacco: Never Used  Vaping Use  . Vaping Use: Never used  Substance and Sexual Activity  . Alcohol use: No    Alcohol/week: 0.0 standard drinks  . Drug use: Yes    Types: Marijuana, Other-see comments    Comment: Pt uses Xanax- prn  . Sexual activity: Not on file  Other Topics Concern  . Not on file  Social History Narrative   Pt lives alone in [place]; on disability   Social Determinants of Health   Financial Resource Strain: Not on file  Food Insecurity: Not on file  Transportation Needs: Not on file  Physical  Activity: Not on file  Stress: Not on file  Social Connections: Not on file  Intimate Partner Violence: Not on file    Review of Systems: See HPI, otherwise negative ROS  Physical Exam: BP 138/79   Pulse 90   Temp (!) 97.1 F (36.2 C) (Temporal)   Ht 5' 10"  (1.778 m)   Wt 176 lb 12.8 oz (80.2 kg)   BMI 25.37 kg/m  General:   Alert,   pleasant and cooperative in NAD Neck:  Supple; no masses or thyromegaly. No significant cervical adenopathy. Lungs:  Clear throughout to auscultation.   No wheezes, crackles, or rhonchi. No acute  distress. Heart:  Regular rate and rhythm; no murmurs, clicks, rubs,  or gallops. Abdomen: Non-distended, normal bowel sounds.  Soft and nontender without appreciable mass or hepatosplenomegaly.  Pulses:  Normal pulses noted. Extremities:  Without clubbing or edema.  Impression/Plan: Pleasant 65 year old gentleman with opioid-induced constipation  -  doing well most of the time on Linzess 290 once daily.  Occasionally takes an extra 290.  We had a discussion reviewing the maximal dose of Linzess should be limited to 290 mcg on any given day. I feel he would do well with augmenting Linzess with on-demand senna or MiraLAX.  History of colonic adenoma; due for surveillance colonoscopy 2023.  Recommendations:  Continue Linzess 290 once daily  May use Senna or Miralax on top of Linzess as needed  OV here in 1 year to set up a surveillance colonoscopy     Notice: This dictation was prepared with Dragon dictation along with smaller phrase technology. Any transcriptional errors that result from this process are unintentional and may not be corrected upon review.

## 2020-10-08 NOTE — Patient Instructions (Signed)
Continue Linzess 290 once daily  May use Senna or Miralax on top of Linzess as needed  OV here in 1 year to set up a Colonoscopy

## 2020-10-11 ENCOUNTER — Other Ambulatory Visit: Payer: Self-pay | Admitting: Orthopedic Surgery

## 2020-10-11 NOTE — Telephone Encounter (Signed)
Rx request 

## 2020-11-05 ENCOUNTER — Other Ambulatory Visit: Payer: Self-pay

## 2020-11-05 ENCOUNTER — Encounter: Payer: Self-pay | Admitting: Orthopaedic Surgery

## 2020-11-05 ENCOUNTER — Ambulatory Visit (INDEPENDENT_AMBULATORY_CARE_PROVIDER_SITE_OTHER): Payer: Medicare Other | Admitting: Orthopaedic Surgery

## 2020-11-05 DIAGNOSIS — M25511 Pain in right shoulder: Secondary | ICD-10-CM | POA: Diagnosis not present

## 2020-11-05 DIAGNOSIS — G8929 Other chronic pain: Secondary | ICD-10-CM | POA: Diagnosis not present

## 2020-11-05 DIAGNOSIS — F17210 Nicotine dependence, cigarettes, uncomplicated: Secondary | ICD-10-CM | POA: Diagnosis not present

## 2020-11-05 NOTE — Patient Instructions (Signed)
Steps to Quit Smoking Smoking tobacco is the leading cause of preventable death. It can affect almost every organ in the body. Smoking puts you and people around you at risk for many serious, long-lasting (chronic) diseases. Quitting smoking can be hard, but it is one of the best things that you can do for your health. It is never too late to quit. How do I get ready to quit? When you decide to quit smoking, make a plan to help you succeed. Before you quit:  Pick a date to quit. Set a date within the next 2 weeks to give you time to prepare.  Write down the reasons why you are quitting. Keep this list in places where you will see it often.  Tell your family, friends, and co-workers that you are quitting. Their support is important.  Talk with your doctor about the choices that may help you quit.  Find out if your health insurance will pay for these treatments.  Know the people, places, things, and activities that make you want to smoke (triggers). Avoid them. What first steps can I take to quit smoking?  Throw away all cigarettes at home, at work, and in your car.  Throw away the things that you use when you smoke, such as ashtrays and lighters.  Clean your car. Make sure to empty the ashtray.  Clean your home, including curtains and carpets. What can I do to help me quit smoking? Talk with your doctor about taking medicines and seeing a counselor at the same time. You are more likely to succeed when you do both.  If you are pregnant or breastfeeding, talk with your doctor about counseling or other ways to quit smoking. Do not take medicine to help you quit smoking unless your doctor tells you to do so. To quit smoking: Quit right away  Quit smoking totally, instead of slowly cutting back on how much you smoke over a period of time.  Go to counseling. You are more likely to quit if you go to counseling sessions regularly. Take medicine You may take medicines to help you quit. Some  medicines need a prescription, and some you can buy over-the-counter. Some medicines may contain a drug called nicotine to replace the nicotine in cigarettes. Medicines may:  Help you to stop having the desire to smoke (cravings).  Help to stop the problems that come when you stop smoking (withdrawal symptoms). Your doctor may ask you to use:  Nicotine patches, gum, or lozenges.  Nicotine inhalers or sprays.  Non-nicotine medicine that is taken by mouth. Find resources Find resources and other ways to help you quit smoking and remain smoke-free after you quit. These resources are most helpful when you use them often. They include:  Online chats with a counselor.  Phone quitlines.  Printed self-help materials.  Support groups or group counseling.  Text messaging programs.  Mobile phone apps. Use apps on your mobile phone or tablet that can help you stick to your quit plan. There are many free apps for mobile phones and tablets as well as websites. Examples include Quit Guide from the CDC and smokefree.gov   What things can I do to make it easier to quit?  Talk to your family and friends. Ask them to support and encourage you.  Call a phone quitline (1-800-QUIT-NOW), reach out to support groups, or work with a counselor.  Ask people who smoke to not smoke around you.  Avoid places that make you want to smoke,   such as: ? Bars. ? Parties. ? Smoke-break areas at work.  Spend time with people who do not smoke.  Lower the stress in your life. Stress can make you want to smoke. Try these things to help your stress: ? Getting regular exercise. ? Doing deep-breathing exercises. ? Doing yoga. ? Meditating. ? Doing a body scan. To do this, close your eyes, focus on one area of your body at a time from head to toe. Notice which parts of your body are tense. Try to relax the muscles in those areas.   How will I feel when I quit smoking? Day 1 to 3 weeks Within the first 24 hours,  you may start to have some problems that come from quitting tobacco. These problems are very bad 2-3 days after you quit, but they do not often last for more than 2-3 weeks. You may get these symptoms:  Mood swings.  Feeling restless, nervous, angry, or annoyed.  Trouble concentrating.  Dizziness.  Strong desire for high-sugar foods and nicotine.  Weight gain.  Trouble pooping (constipation).  Feeling like you may vomit (nausea).  Coughing or a sore throat.  Changes in how the medicines that you take for other issues work in your body.  Depression.  Trouble sleeping (insomnia). Week 3 and afterward After the first 2-3 weeks of quitting, you may start to notice more positive results, such as:  Better sense of smell and taste.  Less coughing and sore throat.  Slower heart rate.  Lower blood pressure.  Clearer skin.  Better breathing.  Fewer sick days. Quitting smoking can be hard. Do not give up if you fail the first time. Some people need to try a few times before they succeed. Do your best to stick to your quit plan, and talk with your doctor if you have any questions or concerns. Summary  Smoking tobacco is the leading cause of preventable death. Quitting smoking can be hard, but it is one of the best things that you can do for your health.  When you decide to quit smoking, make a plan to help you succeed.  Quit smoking right away, not slowly over a period of time.  When you start quitting, seek help from your doctor, family, or friends. This information is not intended to replace advice given to you by your health care provider. Make sure you discuss any questions you have with your health care provider. Document Revised: 06/09/2019 Document Reviewed: 12/03/2018 Elsevier Patient Education  2021 Elsevier Inc.  

## 2020-11-05 NOTE — Progress Notes (Signed)
PROCEDURE NOTE:  The patient request injection, verbal consent was obtained.  The right shoulder was prepped appropriately after time out was performed.   Sterile technique was observed and injection of 1 cc of Celestone 6 mg with several cc's of plain xylocaine. Anesthesia was provided by ethyl chloride and a 20-gauge needle was used to inject the shoulder area. A posterior approach was used.  The injection was tolerated well.  A band aid dressing was applied.  The patient was advised to apply ice later today and tomorrow to the injection sight as needed.  Return in one month.  Electronically Signed Darreld Mclean, MD 2/8/20222:21 PM

## 2020-12-03 ENCOUNTER — Encounter: Payer: Self-pay | Admitting: Orthopaedic Surgery

## 2020-12-03 ENCOUNTER — Ambulatory Visit (INDEPENDENT_AMBULATORY_CARE_PROVIDER_SITE_OTHER): Payer: Medicare Other | Admitting: Orthopaedic Surgery

## 2020-12-03 ENCOUNTER — Other Ambulatory Visit: Payer: Self-pay

## 2020-12-03 DIAGNOSIS — G8929 Other chronic pain: Secondary | ICD-10-CM | POA: Diagnosis not present

## 2020-12-03 DIAGNOSIS — M130 Polyarthritis, unspecified: Secondary | ICD-10-CM | POA: Insufficient documentation

## 2020-12-03 DIAGNOSIS — M25511 Pain in right shoulder: Secondary | ICD-10-CM

## 2020-12-03 DIAGNOSIS — M4804 Spinal stenosis, thoracic region: Secondary | ICD-10-CM | POA: Insufficient documentation

## 2020-12-03 DIAGNOSIS — F17200 Nicotine dependence, unspecified, uncomplicated: Secondary | ICD-10-CM | POA: Insufficient documentation

## 2020-12-03 DIAGNOSIS — J441 Chronic obstructive pulmonary disease with (acute) exacerbation: Secondary | ICD-10-CM | POA: Insufficient documentation

## 2020-12-03 DIAGNOSIS — F17210 Nicotine dependence, cigarettes, uncomplicated: Secondary | ICD-10-CM | POA: Diagnosis not present

## 2020-12-03 DIAGNOSIS — F13930 Sedative, hypnotic or anxiolytic use, unspecified with withdrawal, uncomplicated: Secondary | ICD-10-CM | POA: Insufficient documentation

## 2020-12-03 DIAGNOSIS — F13230 Sedative, hypnotic or anxiolytic dependence with withdrawal, uncomplicated: Secondary | ICD-10-CM | POA: Insufficient documentation

## 2020-12-03 NOTE — Patient Instructions (Signed)
Steps to Quit Smoking Smoking tobacco is the leading cause of preventable death. It can affect almost every organ in the body. Smoking puts you and people around you at risk for many serious, long-lasting (chronic) diseases. Quitting smoking can be hard, but it is one of the best things that you can do for your health. It is never too late to quit. How do I get ready to quit? When you decide to quit smoking, make a plan to help you succeed. Before you quit:  Pick a date to quit. Set a date within the next 2 weeks to give you time to prepare.  Write down the reasons why you are quitting. Keep this list in places where you will see it often.  Tell your family, friends, and co-workers that you are quitting. Their support is important.  Talk with your doctor about the choices that may help you quit.  Find out if your health insurance will pay for these treatments.  Know the people, places, things, and activities that make you want to smoke (triggers). Avoid them. What first steps can I take to quit smoking?  Throw away all cigarettes at home, at work, and in your car.  Throw away the things that you use when you smoke, such as ashtrays and lighters.  Clean your car. Make sure to empty the ashtray.  Clean your home, including curtains and carpets. What can I do to help me quit smoking? Talk with your doctor about taking medicines and seeing a counselor at the same time. You are more likely to succeed when you do both.  If you are pregnant or breastfeeding, talk with your doctor about counseling or other ways to quit smoking. Do not take medicine to help you quit smoking unless your doctor tells you to do so. To quit smoking: Quit right away  Quit smoking totally, instead of slowly cutting back on how much you smoke over a period of time.  Go to counseling. You are more likely to quit if you go to counseling sessions regularly. Take medicine You may take medicines to help you quit. Some  medicines need a prescription, and some you can buy over-the-counter. Some medicines may contain a drug called nicotine to replace the nicotine in cigarettes. Medicines may:  Help you to stop having the desire to smoke (cravings).  Help to stop the problems that come when you stop smoking (withdrawal symptoms). Your doctor may ask you to use:  Nicotine patches, gum, or lozenges.  Nicotine inhalers or sprays.  Non-nicotine medicine that is taken by mouth. Find resources Find resources and other ways to help you quit smoking and remain smoke-free after you quit. These resources are most helpful when you use them often. They include:  Online chats with a counselor.  Phone quitlines.  Printed self-help materials.  Support groups or group counseling.  Text messaging programs.  Mobile phone apps. Use apps on your mobile phone or tablet that can help you stick to your quit plan. There are many free apps for mobile phones and tablets as well as websites. Examples include Quit Guide from the CDC and smokefree.gov   What things can I do to make it easier to quit?  Talk to your family and friends. Ask them to support and encourage you.  Call a phone quitline (1-800-QUIT-NOW), reach out to support groups, or work with a counselor.  Ask people who smoke to not smoke around you.  Avoid places that make you want to smoke,   such as: ? Bars. ? Parties. ? Smoke-break areas at work.  Spend time with people who do not smoke.  Lower the stress in your life. Stress can make you want to smoke. Try these things to help your stress: ? Getting regular exercise. ? Doing deep-breathing exercises. ? Doing yoga. ? Meditating. ? Doing a body scan. To do this, close your eyes, focus on one area of your body at a time from head to toe. Notice which parts of your body are tense. Try to relax the muscles in those areas.   How will I feel when I quit smoking? Day 1 to 3 weeks Within the first 24 hours,  you may start to have some problems that come from quitting tobacco. These problems are very bad 2-3 days after you quit, but they do not often last for more than 2-3 weeks. You may get these symptoms:  Mood swings.  Feeling restless, nervous, angry, or annoyed.  Trouble concentrating.  Dizziness.  Strong desire for high-sugar foods and nicotine.  Weight gain.  Trouble pooping (constipation).  Feeling like you may vomit (nausea).  Coughing or a sore throat.  Changes in how the medicines that you take for other issues work in your body.  Depression.  Trouble sleeping (insomnia). Week 3 and afterward After the first 2-3 weeks of quitting, you may start to notice more positive results, such as:  Better sense of smell and taste.  Less coughing and sore throat.  Slower heart rate.  Lower blood pressure.  Clearer skin.  Better breathing.  Fewer sick days. Quitting smoking can be hard. Do not give up if you fail the first time. Some people need to try a few times before they succeed. Do your best to stick to your quit plan, and talk with your doctor if you have any questions or concerns. Summary  Smoking tobacco is the leading cause of preventable death. Quitting smoking can be hard, but it is one of the best things that you can do for your health.  When you decide to quit smoking, make a plan to help you succeed.  Quit smoking right away, not slowly over a period of time.  When you start quitting, seek help from your doctor, family, or friends. This information is not intended to replace advice given to you by your health care provider. Make sure you discuss any questions you have with your health care provider. Document Revised: 06/09/2019 Document Reviewed: 12/03/2018 Elsevier Patient Education  2021 Elsevier Inc.  

## 2020-12-03 NOTE — Progress Notes (Signed)
PROCEDURE NOTE:  The patient request injection, verbal consent was obtained.  The right shoulder was prepped appropriately after time out was performed.   Sterile technique was observed and injection of 1 cc of Celestone 6 mg with several cc's of plain xylocaine. Anesthesia was provided by ethyl chloride and a 20-gauge needle was used to inject the shoulder area. A posterior approach was used.  The injection was tolerated well.  A band aid dressing was applied.  The patient was advised to apply ice later today and tomorrow to the injection sight as needed.  Return in one month.  Electronically Signed Darreld Mclean, MD 3/8/20222:02 PM

## 2020-12-05 ENCOUNTER — Other Ambulatory Visit: Payer: Self-pay | Admitting: Cardiology

## 2020-12-31 ENCOUNTER — Ambulatory Visit (INDEPENDENT_AMBULATORY_CARE_PROVIDER_SITE_OTHER): Payer: Medicare Other | Admitting: Orthopaedic Surgery

## 2020-12-31 ENCOUNTER — Encounter: Payer: Self-pay | Admitting: Orthopaedic Surgery

## 2020-12-31 ENCOUNTER — Other Ambulatory Visit: Payer: Self-pay

## 2020-12-31 DIAGNOSIS — F17210 Nicotine dependence, cigarettes, uncomplicated: Secondary | ICD-10-CM | POA: Diagnosis not present

## 2020-12-31 DIAGNOSIS — G8929 Other chronic pain: Secondary | ICD-10-CM | POA: Diagnosis not present

## 2020-12-31 DIAGNOSIS — M25511 Pain in right shoulder: Secondary | ICD-10-CM | POA: Diagnosis not present

## 2020-12-31 NOTE — Progress Notes (Signed)
PROCEDURE NOTE:  The patient request injection, verbal consent was obtained.  The right shoulder was prepped appropriately after time out was performed.   Sterile technique was observed and injection of 1 cc of Celestone 6 mg with several cc's of plain xylocaine. Anesthesia was provided by ethyl chloride and a 20-gauge needle was used to inject the shoulder area. A posterior approach was used.  The injection was tolerated well.  A band aid dressing was applied.  The patient was advised to apply ice later today and tomorrow to the injection sight as needed.  Return in one month.  Call if any problem.  Precautions discussed.   Electronically Signed Darreld Mclean, MD 4/5/20221:45 PM

## 2020-12-31 NOTE — Patient Instructions (Signed)

## 2021-01-07 ENCOUNTER — Ambulatory Visit: Payer: Medicare Other | Admitting: Internal Medicine

## 2021-01-14 ENCOUNTER — Other Ambulatory Visit: Payer: Self-pay | Admitting: Internal Medicine

## 2021-01-28 ENCOUNTER — Ambulatory Visit (INDEPENDENT_AMBULATORY_CARE_PROVIDER_SITE_OTHER): Payer: Medicare Other | Admitting: Orthopaedic Surgery

## 2021-01-28 ENCOUNTER — Encounter: Payer: Self-pay | Admitting: Orthopaedic Surgery

## 2021-01-28 ENCOUNTER — Other Ambulatory Visit: Payer: Self-pay

## 2021-01-28 DIAGNOSIS — F17210 Nicotine dependence, cigarettes, uncomplicated: Secondary | ICD-10-CM | POA: Diagnosis not present

## 2021-01-28 DIAGNOSIS — M25511 Pain in right shoulder: Secondary | ICD-10-CM

## 2021-01-28 DIAGNOSIS — G8929 Other chronic pain: Secondary | ICD-10-CM

## 2021-01-28 NOTE — Progress Notes (Signed)
PROCEDURE NOTE:  The patient request injection, verbal consent was obtained.  The right shoulder was prepped appropriately after time out was performed.   Sterile technique was observed and injection of 1 cc of Celestone 6 mg with several cc's of plain xylocaine. Anesthesia was provided by ethyl chloride and a 20-gauge needle was used to inject the shoulder area. A posterior approach was used.  The injection was tolerated well.  A band aid dressing was applied.  The patient was advised to apply ice later today and tomorrow to the injection sight as needed.  Return in one month.  Call if any problem.  Precautions discussed.   Electronically Signed Darreld Mclean, MD 5/3/20221:59 PM

## 2021-02-20 IMAGING — CT CT L SPINE W/O CM
3 of 4 series · 12 of 35 positions shown, 13 images · non-contrast
Comparison: Lumbar spine CT 02/07/2020

CLINICAL DATA: Provided history: Back pain. Right-sided back pain
radiating to right lower quadrant.

EXAM:
CT LUMBAR SPINE WITHOUT CONTRAST
TECHNIQUE: Multidetector CT imaging of the lumbar spine was performed without
intravenous contrast administration. Multiplanar CT image
reconstructions were also generated.

[Series 4: thins lumbar · coronal · 0.36mm/px · 3 of 92 slices shown (1 of 3)]
[im 19/92  bone]
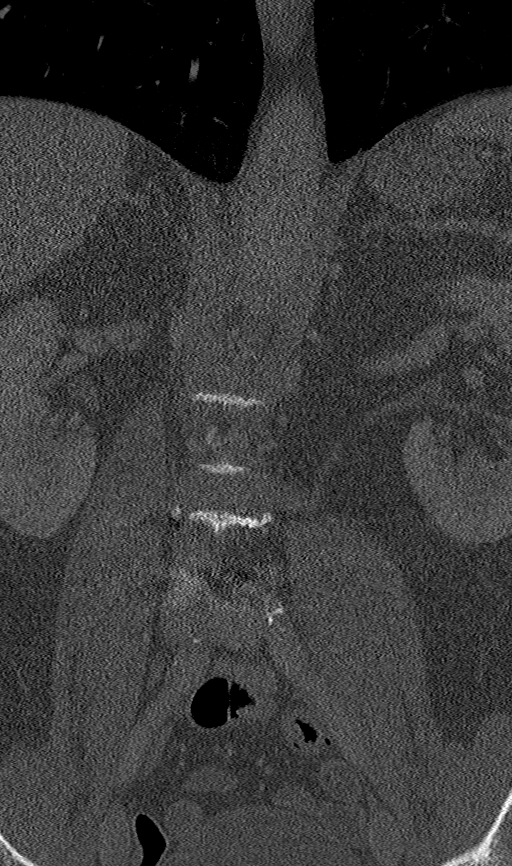
[im 37/92  bone]
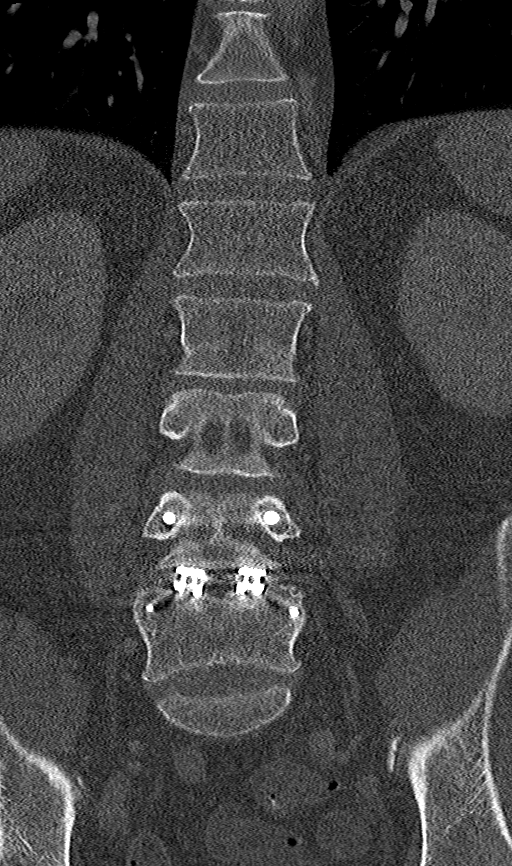
[im 55/92  bone]
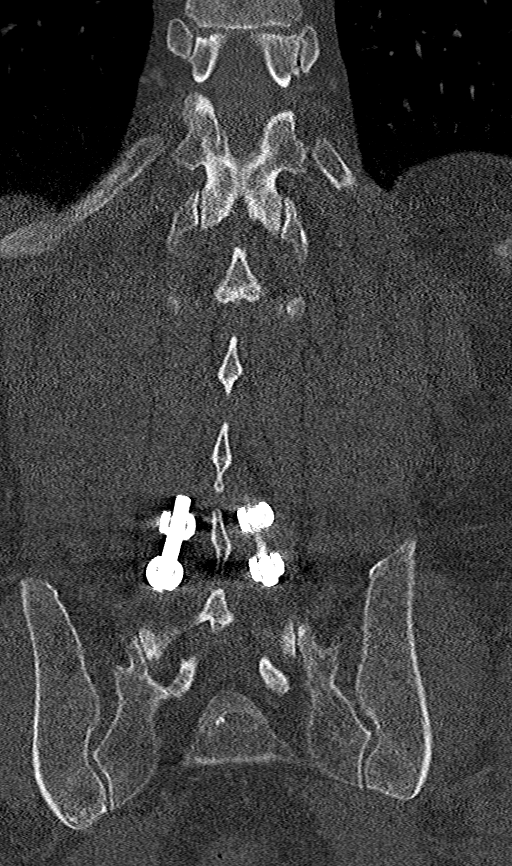

[Series 5: thins lumbar · sagittal · 0.38mm/px · 6 of 103 slices shown (2 of 3)]
[im 6/103  soft-tissue]
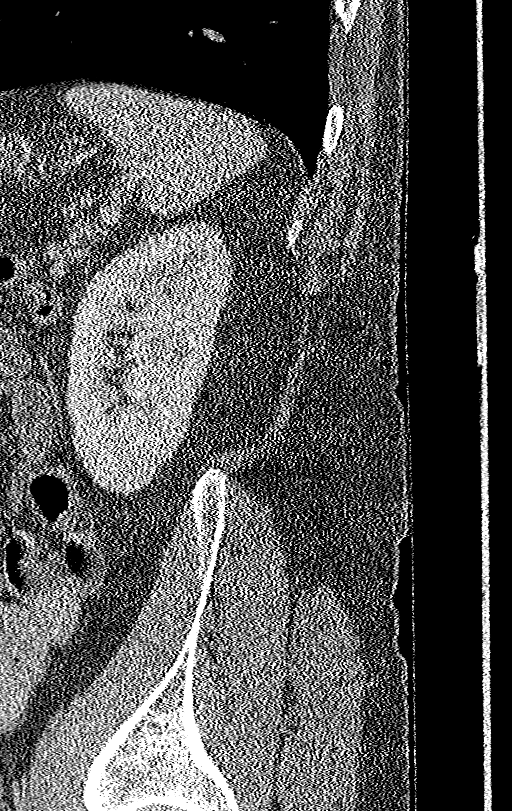
[im 18/103  bone]
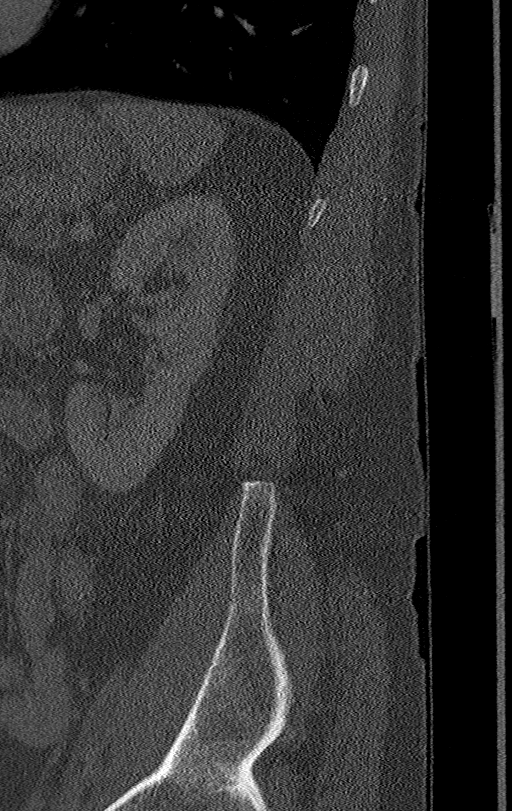
[im 35/103  bone]
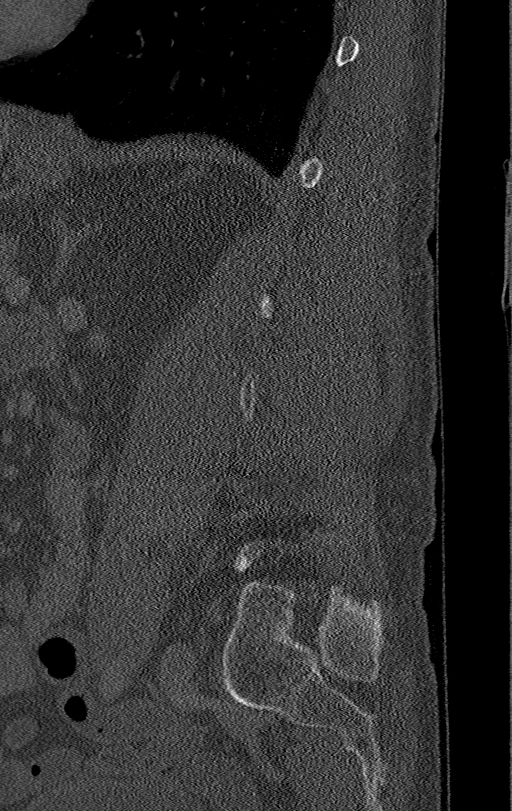
[im 52/103  bone]
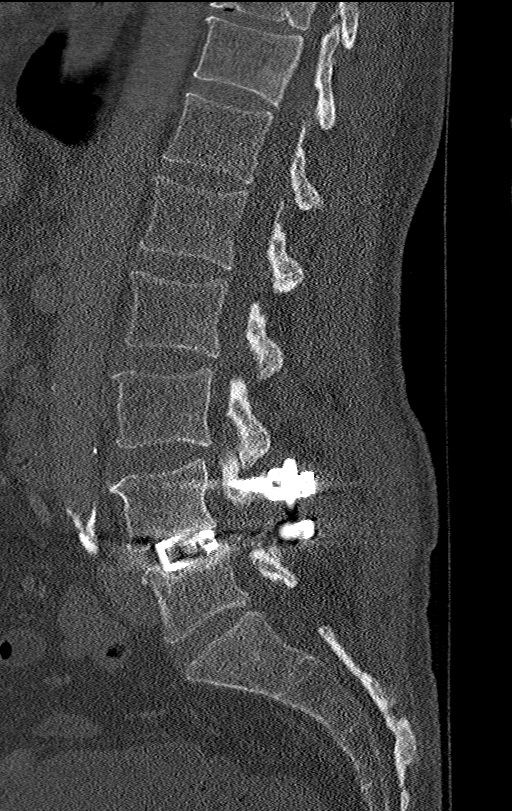
[im 69/103  bone]
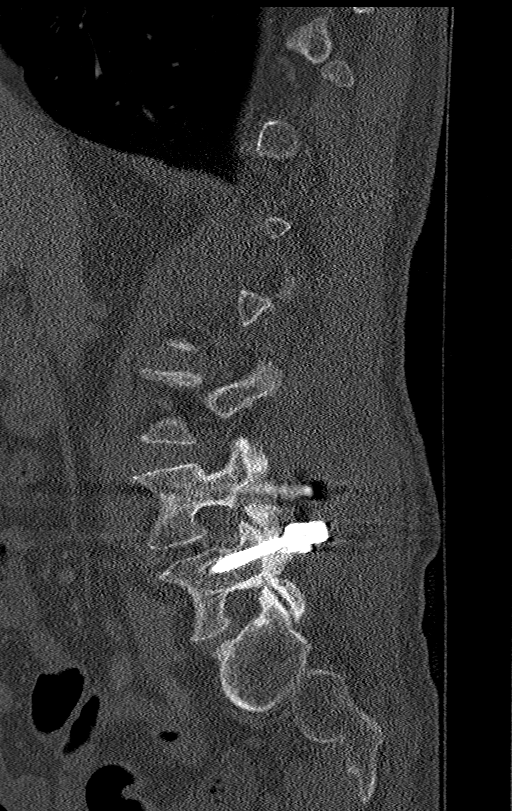
[im 86/103  bone]
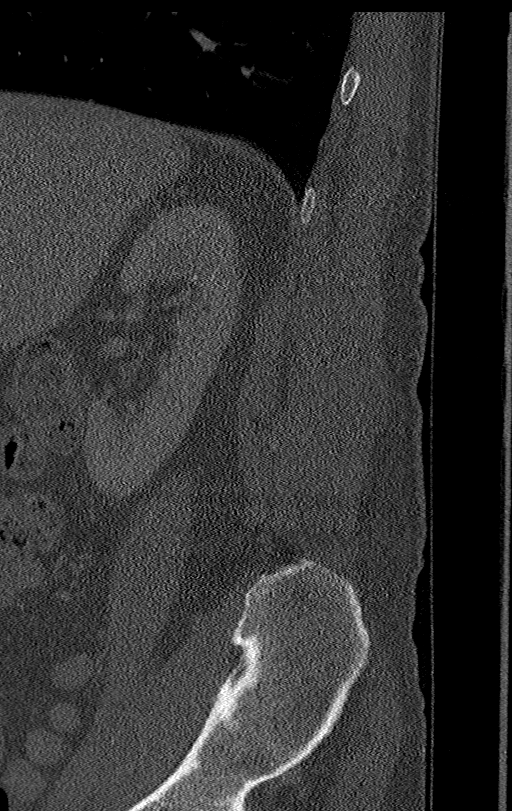

[Series 6: thins lumbar · axial · 0.36mm/px · z∈[+986,+1176]mm · 3 of 155 slices shown, 4 images (3 of 3)]
[im 36/155  soft-tissue]
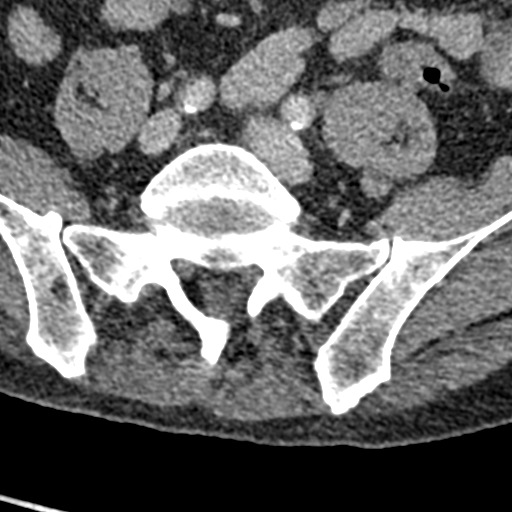
[im 36/155  bone]
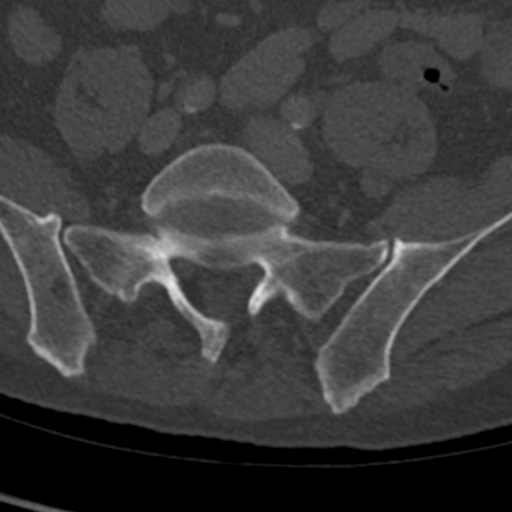
[im 83/155  bone]
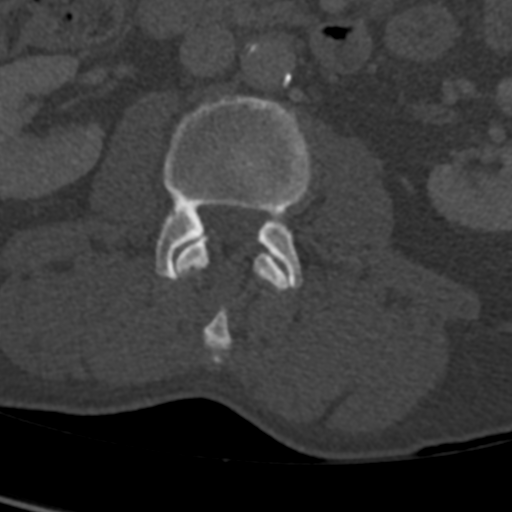
[im 131/155  bone]
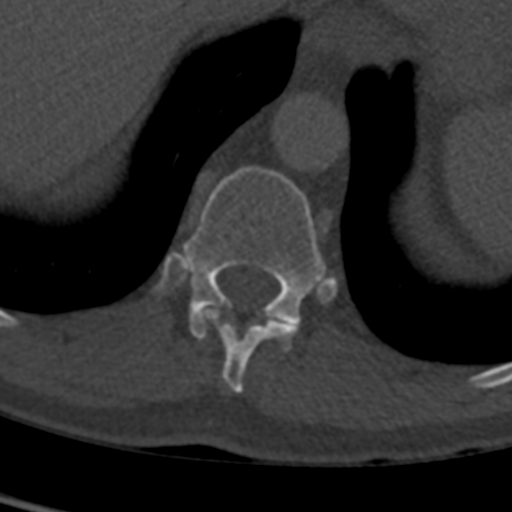

[12 of 35 positions shown; findings below may reference images not displayed]

FINDINGS: Segmentation: 5 lumbar vertebrae.

Alignment: Mild lower lumbar dextrocurvature. Minimal L4-L5 grade 1
anterolisthesis

Vertebrae: Vertebral body height is maintained. No evidence of acute
fracture to the lumbar spine redemonstrated post-laminectomy changes
at L4-L5 with interbody spacer and posterior spinal fusion construct
at this level. No evidence of hardware compromise.

Paraspinal and other soft tissues: Please refer to concurrently
performed CT abdomen/pelvis for description of soft tissue findings.
Postsurgical changes to the lower lumbar dorsal paraspinal soft
tissues.

Disc levels:

Unless otherwise stated, the level by level findings below have not
significantly changed since prior CT 02/07/2020.

No more than mild disc space narrowing within the lumbar spine.

T12-L1: No appreciable significant disc herniation, spinal canal or
neural foraminal narrowing.

L1-L2: Minimal disc bulge. No appreciable significant spinal canal
stenosis. Mild bilateral neural foraminal narrowing.

L2-L3: Small disc bulge. Mild facet arthrosis. No appreciable
significant spinal canal stenosis. Mild bilateral neural foraminal
narrowing.

L3-L4: Disc bulge. Mild facet arthrosis/ligamentum flavum
hypertrophy. Mild bilateral subarticular narrowing. No appreciable
significant central canal stenosis. Moderate bilateral neural
foraminal narrowing.

L4-L5: Post-laminectomy changes with interbody spacer and posterior
spinal fusion construct. There is no appreciable significant disc
herniation or spinal canal stenosis at this level, although streak
artifact significantly limits evaluation. Facet hypertrophy
contributes to moderate bilateral neural foraminal narrowing.

L5-S1: Post laminectomy changes on the left. Facet arthrosis. No
appreciable significant disc herniation. Bilateral neural foraminal
narrowing (mild right, moderate left).
IMPRESSION: Postsurgical changes at the L4-L5 and L5-S1 level, unchanged as
compared to prior CT 02/07/2020.

Lumbar spondylosis as outlined and also unchanged as compared to
this prior exam. No more than mild appreciable spinal canal stenosis
at any level. Multilevel neural foraminal narrowing greatest
bilaterally at bilaterally at L3-L4, bilaterally at L4-L5 and on the
left at L5-S1 (moderate in severity at these sites).

Please refer to concurrently performed CT abdomen/pelvis for a
description of soft tissue findings.

## 2021-02-20 IMAGING — CT CT ABD-PELV W/ CM
1 of 3 series · 14 of 32 positions shown, 19 images · IV contrast (Omni 300)
Comparison: 03/15/2020

CLINICAL DATA: Bowel obstruction suspected, right-sided back pain
radiating to right lower quadrant

EXAM:
CT ABDOMEN AND PELVIS WITH CONTRAST
TECHNIQUE: Multidetector CT imaging of the abdomen and pelvis was performed
using the standard protocol following bolus administration of
intravenous contrast.
CONTRAST:  100mL OMNIPAQUE IOHEXOL 300 MG/ML  SOLN

[Series 3: a/p w/ 5mm · axial · 0.81mm/px · z∈[+804,+1199]mm · 14 of 89 slices shown, 19 images]
[im 5/89  soft-tissue]
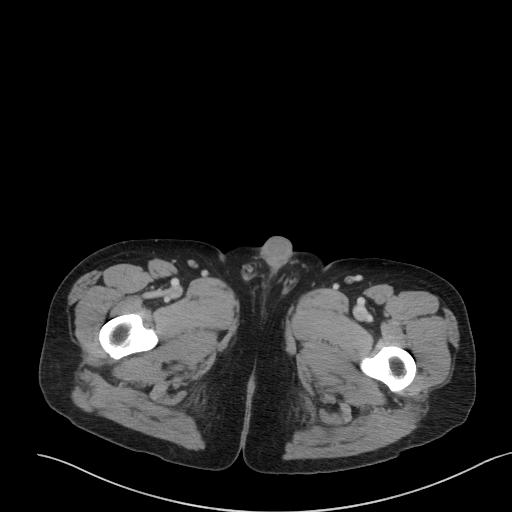
[im 5/89  bone]
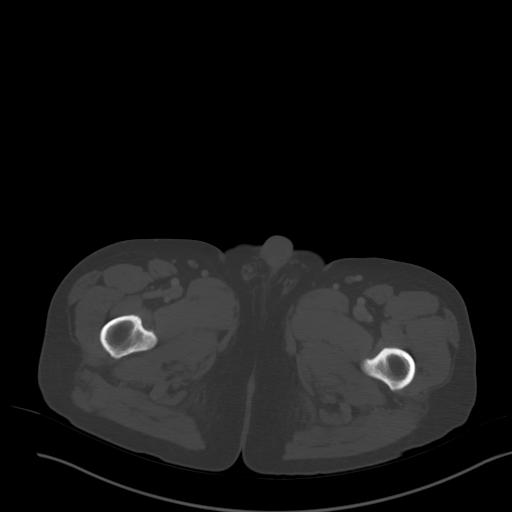
[im 14/89  soft-tissue]
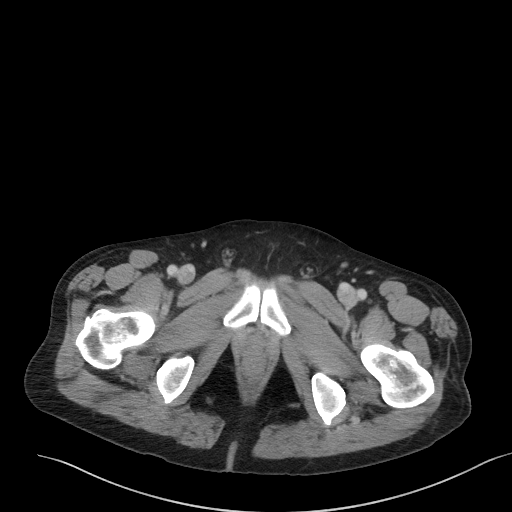
[im 19/89  soft-tissue]
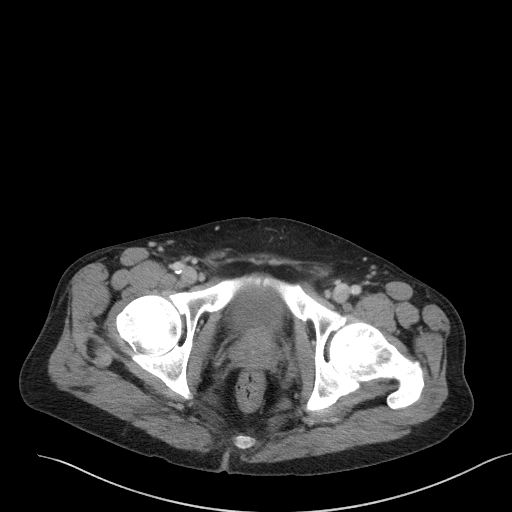
[im 24/89  soft-tissue]
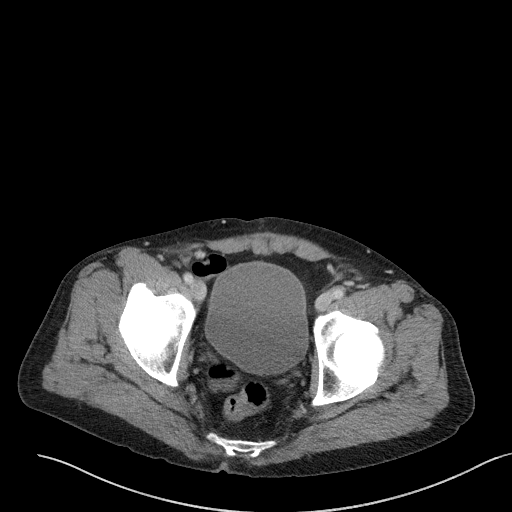
[im 33/89  soft-tissue]
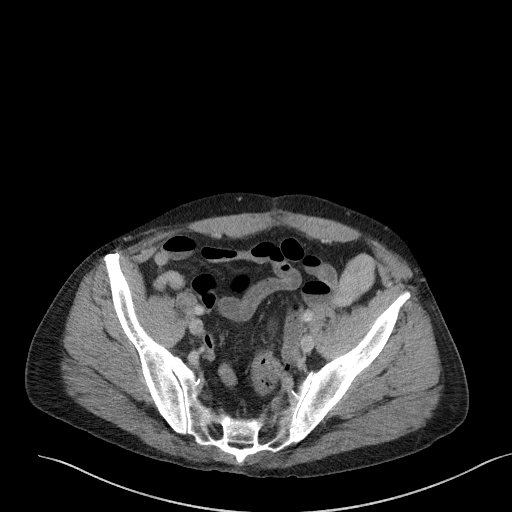
[im 38/89  soft-tissue]
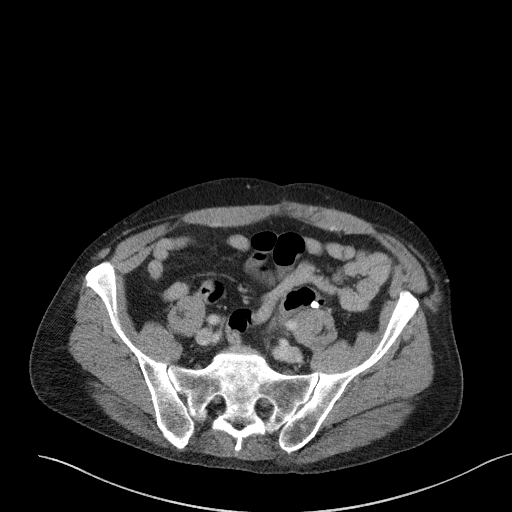
[im 47/89  soft-tissue]
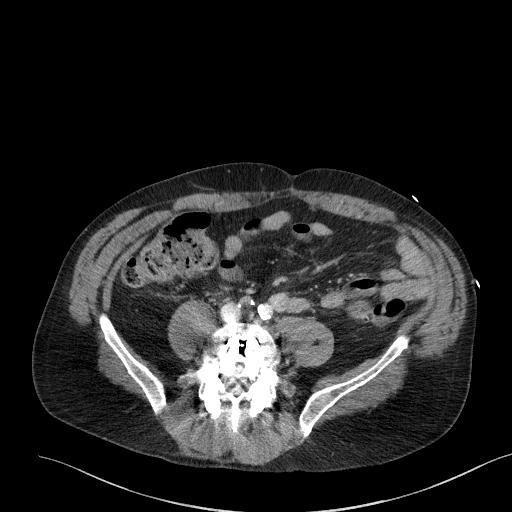
[im 51/89  soft-tissue]
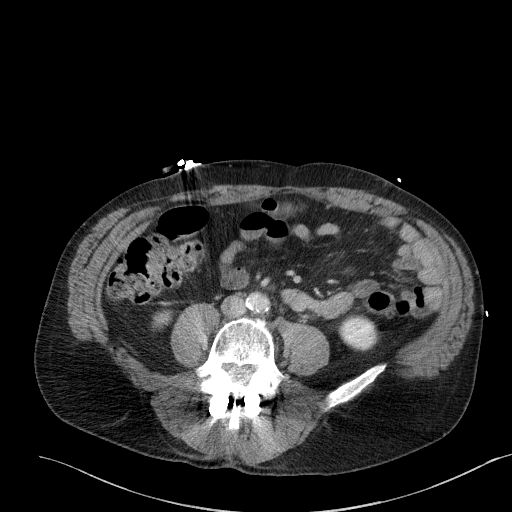
[im 56/89  soft-tissue]
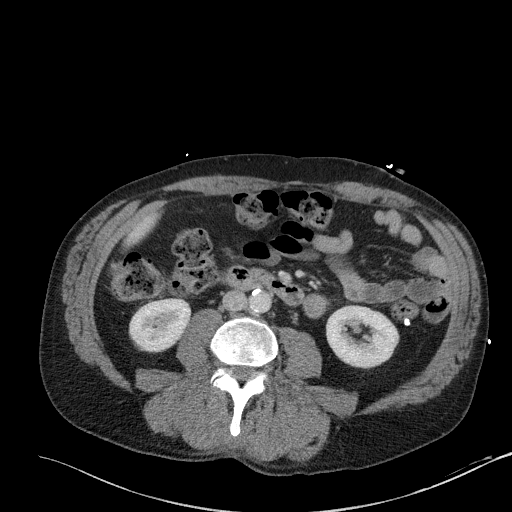
[im 56/89  bone]
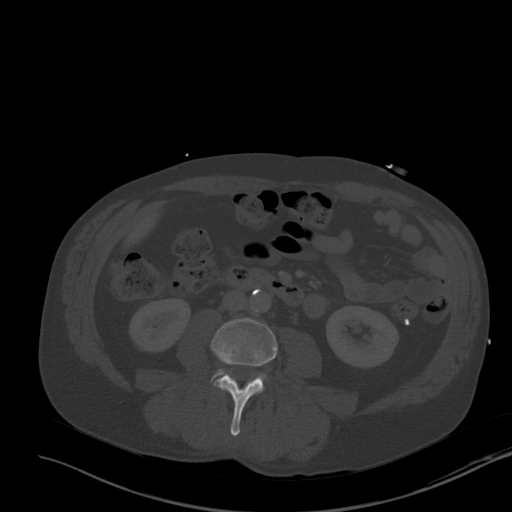
[im 65/89  soft-tissue]
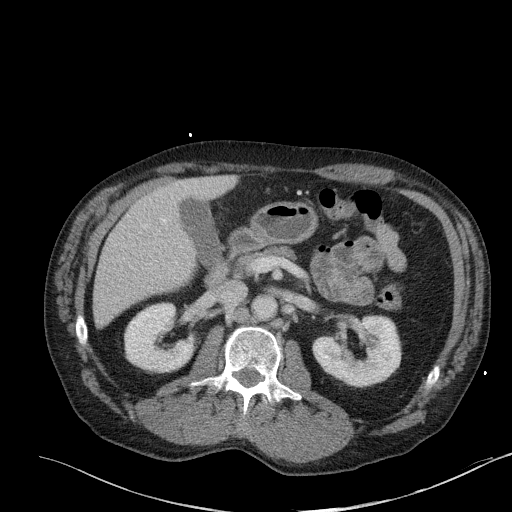
[im 70/89  soft-tissue]
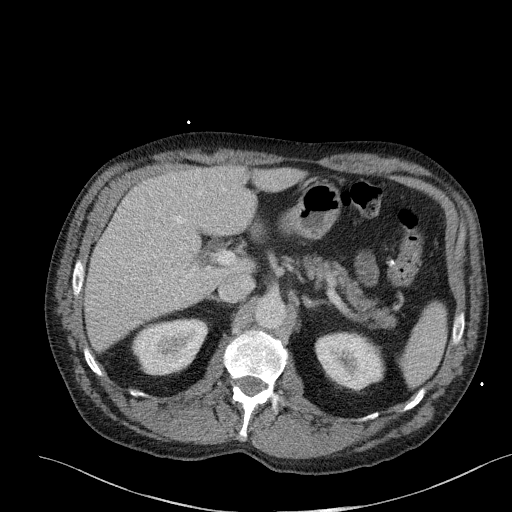
[im 70/89  lung]
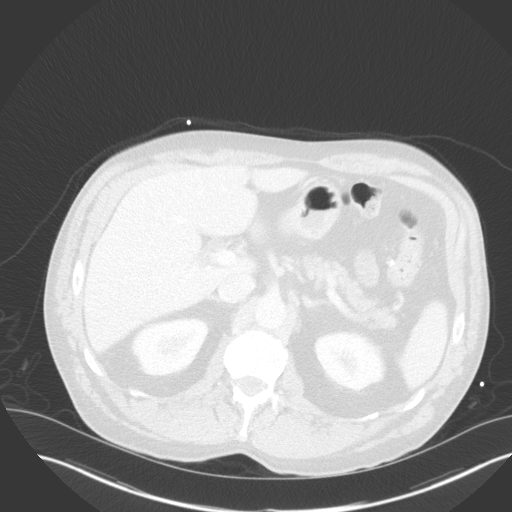
[im 75/89  soft-tissue]
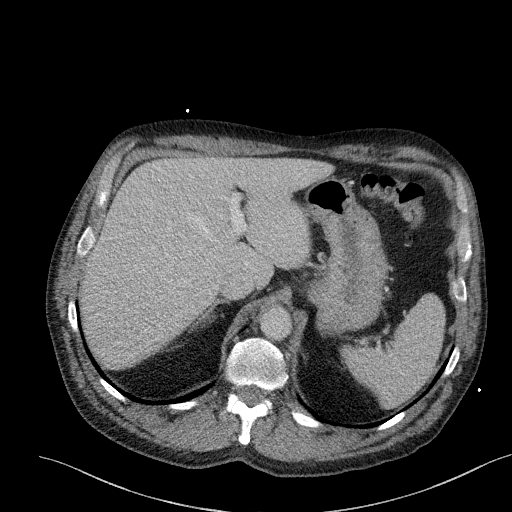
[im 75/89  lung]
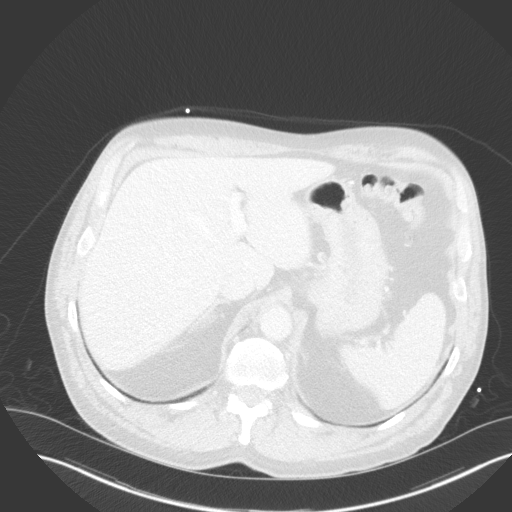
[im 79/89  lung]
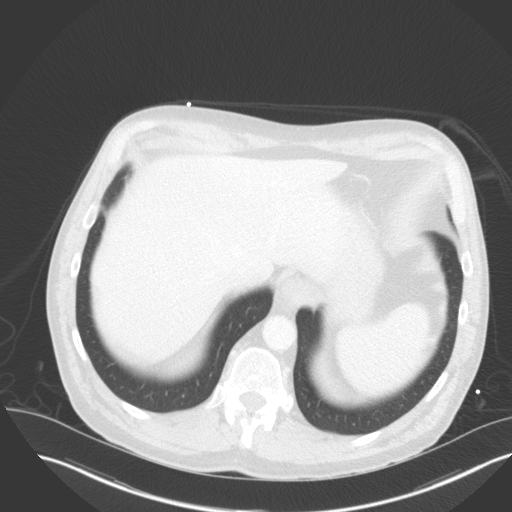
[im 84/89  soft-tissue]
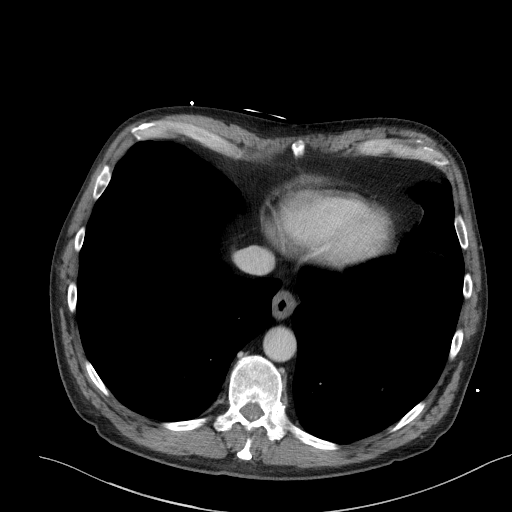
[im 84/89  lung]
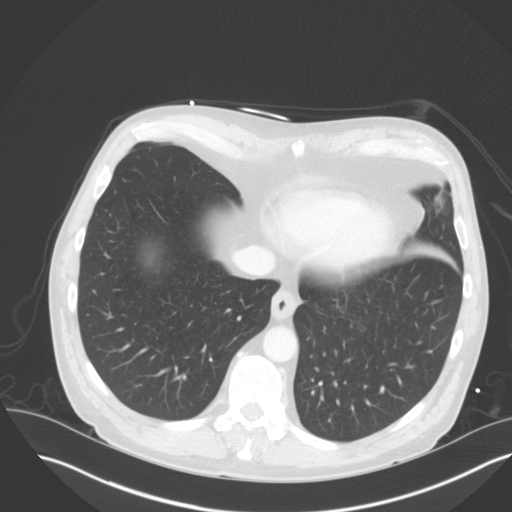

[14 of 32 positions shown; findings below may reference images not displayed]

FINDINGS: Lower chest: No acute abnormality.

Hepatobiliary: No solid liver abnormality is seen. No gallstones,
gallbladder wall thickening, or biliary dilatation.

Pancreas: Unremarkable. No pancreatic ductal dilatation or
surrounding inflammatory changes.

Spleen: Normal in size without significant abnormality.

Adrenals/Urinary Tract: Adrenal glands are unremarkable. Kidneys are
normal, without renal calculi, solid lesion, or hydronephrosis.
Bladder is unremarkable.

Stomach/Bowel: Stomach is within normal limits. Appendix is not
clearly visualized and may be surgically absent. No evidence of
bowel wall thickening, distention, or inflammatory changes.
Descending and sigmoid diverticulosis.

Vascular/Lymphatic: Aortic atherosclerosis. No enlarged abdominal or
pelvic lymph nodes.

Reproductive: No mass or other significant abnormality.

Other: No abdominal wall hernia or abnormality. No abdominopelvic
ascites.

Musculoskeletal: No acute or significant osseous findings.
IMPRESSION: 1. No CT findings of the abdomen or pelvis to explain right-sided
abdominal pain.
2. Descending and sigmoid diverticulosis without evidence of acute
diverticulitis.
3. Aortic Atherosclerosis ([id number]).

## 2021-03-04 ENCOUNTER — Encounter: Payer: Self-pay | Admitting: Orthopaedic Surgery

## 2021-03-04 ENCOUNTER — Ambulatory Visit (INDEPENDENT_AMBULATORY_CARE_PROVIDER_SITE_OTHER): Payer: Medicare Other | Admitting: Orthopaedic Surgery

## 2021-03-04 ENCOUNTER — Other Ambulatory Visit: Payer: Self-pay

## 2021-03-04 DIAGNOSIS — F17210 Nicotine dependence, cigarettes, uncomplicated: Secondary | ICD-10-CM

## 2021-03-04 DIAGNOSIS — G8929 Other chronic pain: Secondary | ICD-10-CM

## 2021-03-04 DIAGNOSIS — M25511 Pain in right shoulder: Secondary | ICD-10-CM

## 2021-03-04 NOTE — Progress Notes (Signed)
PROCEDURE NOTE:  The patient request injection, verbal consent was obtained.  The right shoulder was prepped appropriately after time out was performed.   Sterile technique was observed and injection of 1 cc of DepoMedrol 40mg  with several cc's of plain xylocaine. Anesthesia was provided by ethyl chloride and a 20-gauge needle was used to inject the shoulder area. A posterior approach was used.  The injection was tolerated well.  A band aid dressing was applied.  The patient was advised to apply ice later today and tomorrow to the injection sight as needed.  Return in one month.  Call if any problem.  Precautions discussed.   Electronically Signed , MD 6/7/20221:50 PM

## 2021-04-08 ENCOUNTER — Encounter: Payer: Self-pay | Admitting: Orthopaedic Surgery

## 2021-04-08 ENCOUNTER — Ambulatory Visit (INDEPENDENT_AMBULATORY_CARE_PROVIDER_SITE_OTHER): Payer: Medicare Other | Admitting: Orthopaedic Surgery

## 2021-04-08 ENCOUNTER — Other Ambulatory Visit: Payer: Self-pay

## 2021-04-08 DIAGNOSIS — F17210 Nicotine dependence, cigarettes, uncomplicated: Secondary | ICD-10-CM

## 2021-04-08 DIAGNOSIS — G8929 Other chronic pain: Secondary | ICD-10-CM

## 2021-04-08 DIAGNOSIS — M25511 Pain in right shoulder: Secondary | ICD-10-CM | POA: Diagnosis not present

## 2021-04-08 NOTE — Progress Notes (Signed)
PROCEDURE NOTE:  The patient request injection, verbal consent was obtained.  The right shoulder was prepped appropriately after time out was performed.   Sterile technique was observed and injection of 1 cc of Celestone 6 mg with several cc's of plain xylocaine. Anesthesia was provided by ethyl chloride and a 20-gauge needle was used to inject the shoulder area. A posterior approach was used.  The injection was tolerated well.  A band aid dressing was applied.  The patient was advised to apply ice later today and tomorrow to the injection sight as needed.   Return in one month.  Call if any problem.  Precautions discussed.  Electronically Signed Darreld Mclean, MD 7/12/20221:37 PM

## 2021-04-14 ENCOUNTER — Other Ambulatory Visit (HOSPITAL_COMMUNITY): Payer: Self-pay | Admitting: Internal Medicine

## 2021-04-14 DIAGNOSIS — Z720 Tobacco use: Secondary | ICD-10-CM

## 2021-04-14 DIAGNOSIS — F17200 Nicotine dependence, unspecified, uncomplicated: Secondary | ICD-10-CM

## 2021-04-21 ENCOUNTER — Other Ambulatory Visit (HOSPITAL_COMMUNITY): Payer: Self-pay | Admitting: Internal Medicine

## 2021-04-21 ENCOUNTER — Other Ambulatory Visit: Payer: Self-pay | Admitting: Internal Medicine

## 2021-04-21 DIAGNOSIS — Z720 Tobacco use: Secondary | ICD-10-CM

## 2021-04-24 ENCOUNTER — Other Ambulatory Visit: Payer: Self-pay | Admitting: Cardiology

## 2021-04-28 ENCOUNTER — Encounter: Payer: Self-pay | Admitting: Internal Medicine

## 2021-05-05 ENCOUNTER — Other Ambulatory Visit (HOSPITAL_COMMUNITY): Payer: Self-pay | Admitting: Internal Medicine

## 2021-05-05 ENCOUNTER — Other Ambulatory Visit: Payer: Self-pay | Admitting: Internal Medicine

## 2021-05-05 DIAGNOSIS — Z720 Tobacco use: Secondary | ICD-10-CM

## 2021-05-13 ENCOUNTER — Ambulatory Visit: Payer: Medicare Other | Admitting: Orthopaedic Surgery

## 2021-05-22 ENCOUNTER — Other Ambulatory Visit (HOSPITAL_COMMUNITY): Payer: Self-pay | Admitting: Internal Medicine

## 2021-05-22 ENCOUNTER — Other Ambulatory Visit: Payer: Self-pay | Admitting: Internal Medicine

## 2021-05-22 DIAGNOSIS — Z720 Tobacco use: Secondary | ICD-10-CM

## 2021-05-22 DIAGNOSIS — F17200 Nicotine dependence, unspecified, uncomplicated: Secondary | ICD-10-CM

## 2021-05-22 DIAGNOSIS — F17210 Nicotine dependence, cigarettes, uncomplicated: Secondary | ICD-10-CM

## 2021-05-27 ENCOUNTER — Ambulatory Visit (INDEPENDENT_AMBULATORY_CARE_PROVIDER_SITE_OTHER): Payer: Medicare Other | Admitting: Orthopaedic Surgery

## 2021-05-27 ENCOUNTER — Other Ambulatory Visit: Payer: Self-pay

## 2021-05-27 ENCOUNTER — Other Ambulatory Visit: Payer: Self-pay | Admitting: Cardiology

## 2021-05-27 ENCOUNTER — Encounter: Payer: Self-pay | Admitting: Orthopaedic Surgery

## 2021-05-27 DIAGNOSIS — F17210 Nicotine dependence, cigarettes, uncomplicated: Secondary | ICD-10-CM

## 2021-05-27 DIAGNOSIS — G8929 Other chronic pain: Secondary | ICD-10-CM | POA: Diagnosis not present

## 2021-05-27 DIAGNOSIS — M25511 Pain in right shoulder: Secondary | ICD-10-CM

## 2021-05-27 NOTE — Progress Notes (Signed)
PROCEDURE NOTE:  The patient request injection, verbal consent was obtained.  The right shoulder was prepped appropriately after time out was performed.   Sterile technique was observed and injection of 1 cc of Celestone 6 mg with several cc's of plain xylocaine. Anesthesia was provided by ethyl chloride and a 20-gauge needle was used to inject the shoulder area. A posterior approach was used.  The injection was tolerated well.  A band aid dressing was applied.  The patient was advised to apply ice later today and tomorrow to the injection sight as needed.   Return in one month.  Call if any problem.  Precautions discussed.  Electronically Signed Darreld Mclean, MD 8/30/20223:04 PM

## 2021-06-24 ENCOUNTER — Other Ambulatory Visit: Payer: Self-pay

## 2021-06-24 ENCOUNTER — Ambulatory Visit (HOSPITAL_COMMUNITY)
Admission: RE | Admit: 2021-06-24 | Discharge: 2021-06-24 | Disposition: A | Discharge status: Home / Self Care | Payer: Medicare Other | Source: Ambulatory Visit | Attending: Internal Medicine | Admitting: Internal Medicine

## 2021-06-24 ENCOUNTER — Other Ambulatory Visit: Payer: Self-pay | Admitting: Cardiology

## 2021-06-24 DIAGNOSIS — F17210 Nicotine dependence, cigarettes, uncomplicated: Secondary | ICD-10-CM | POA: Insufficient documentation

## 2021-07-01 ENCOUNTER — Encounter: Payer: Self-pay | Admitting: Internal Medicine

## 2021-07-01 ENCOUNTER — Other Ambulatory Visit: Payer: Self-pay

## 2021-07-01 ENCOUNTER — Ambulatory Visit (INDEPENDENT_AMBULATORY_CARE_PROVIDER_SITE_OTHER): Payer: Medicare Other | Admitting: Internal Medicine

## 2021-07-01 ENCOUNTER — Ambulatory Visit (INDEPENDENT_AMBULATORY_CARE_PROVIDER_SITE_OTHER): Payer: Medicare Other | Admitting: Orthopaedic Surgery

## 2021-07-01 ENCOUNTER — Encounter: Payer: Self-pay | Admitting: Orthopaedic Surgery

## 2021-07-01 ENCOUNTER — Other Ambulatory Visit: Payer: Self-pay | Admitting: Internal Medicine

## 2021-07-01 VITALS — BP 136/81 | HR 72 | Temp 96.6°F | Ht 70.0 in | Wt 177.2 lb

## 2021-07-01 DIAGNOSIS — G8929 Other chronic pain: Secondary | ICD-10-CM | POA: Diagnosis not present

## 2021-07-01 DIAGNOSIS — F17210 Nicotine dependence, cigarettes, uncomplicated: Secondary | ICD-10-CM | POA: Diagnosis not present

## 2021-07-01 DIAGNOSIS — Z86010 Personal history of colonic polyps: Secondary | ICD-10-CM

## 2021-07-01 DIAGNOSIS — M25511 Pain in right shoulder: Secondary | ICD-10-CM | POA: Diagnosis not present

## 2021-07-01 DIAGNOSIS — R1033 Periumbilical pain: Secondary | ICD-10-CM

## 2021-07-01 DIAGNOSIS — K5900 Constipation, unspecified: Secondary | ICD-10-CM | POA: Diagnosis not present

## 2021-07-01 MED ORDER — PANTOPRAZOLE SODIUM 40 MG PO TBEC: 40.0000 mg | DELAYED_RELEASE_TABLET | Freq: Every day | ORAL | 11 refills | Status: DC

## 2021-07-01 NOTE — Progress Notes (Signed)
Primary Care Physician:  Abran Richard, MD Primary Gastroenterologist:  Dr. Gala Romney  Pre-Procedure History & Physical: HPI:  Peter Campbell is a 65 y.o. male here for follow-up of opioid-induced constipation.  History colonic adenoma.  Bowel function doing well on Linzess 290 daily.  No rectal bleeding.  Has noted some pain in the epigastric area tickly when he was taking naproxen he stopped naproxen any improved.  No significant GERD/reflux symptoms.  No dysphagia.  No melena or rectal bleeding. He only takes an aspirin 81 mg daily currently; no other NSAIDs.  No PPI.  History of rheumatoid arthritis.  Came off Enbrel when he moved back from [place].  Does not have much in the way of reflux symptoms.  No dysphagia.  Past Medical History:  Diagnosis Date   Arthritis    Chronic back pain    Collagen vascular disease ([place])    Colonic mass    History, s/p removal; per patient.   Constipation    COPD (chronic obstructive pulmonary disease) (HCC)    Coronary atherosclerosis of native coronary artery    Dense coronary atherosclerosis by chest CT November 2014    Dyspnea    when walking distance   Essential hypertension    Essential tremor    History of chicken pox    History of [place] measles    Pneumonia    2015 ish   Sciatica     Past Surgical History:  Procedure Laterality Date   CIRCUMCISION     COLON SURGERY  '80's   for mass removal, colonostomy   COLONOSCOPY WITH PROPOFOL N/A 05/03/2017   Two 3-5 mm polyps in rectum and descending colon s/p removal, pancolonic diverticulosis, internal hemorrhoids. Benign. Surveillance due in 5 years.    colonostomy reversal  80's   LUMBAR FUSION  11/27/2019   Lumber discectomy     Middle finger Right    fracture   NASAL SEPTUM SURGERY     POLYPECTOMY  05/03/2017   Procedure: POLYPECTOMY;  Surgeon: Daneil Dolin, MD;  Location: [facility];  Service: Endoscopy;;  descending colon and rectal   SHOULDER ACROMIOPLASTY Right 06/12/2014    Procedure: RIGHT SHOULDER ACROMIOPLASTY;  Surgeon: [name], MD;  Location: [facility];  Service: Orthopedics;  Laterality: Right;   SHOULDER OPEN ROTATOR CUFF REPAIR Right 06/12/2014   Procedure: OPEN REPAIR ROTATOR CUFF RIGHT ;  Surgeon: [name], MD;  Location: [facility];  Service: Orthopedics;  Laterality: Right;   TONSILLECTOMY     TYMPANOSTOMY TUBE PLACEMENT      Prior to Admission medications   Medication Sig Start Date End Date Taking? Authorizing Provider  albuterol (PROVENTIL HFA;VENTOLIN HFA) 108 (90 Base) MCG/ACT inhaler Inhale 1-2 puffs into the lungs every 6 (six) hours as needed for wheezing or shortness of breath.   Yes [provider]  amLODipine (NORVASC) 5 MG tablet TAKE 1 TABLET BY MOUTH ONCE DAILY. 06/24/21  Yes Satira Sark, MD  aspirin EC 81 MG tablet Take 1 tablet (81 mg total) by mouth daily. 10/31/13  Yes Satira Sark, MD  gabapentin (NEURONTIN) 600 MG tablet Take 600 mg by mouth 3 (three) times daily.  01/26/18  Yes [provider]  ipratropium-albuterol (DUONEB) 0.5-2.5 (3) MG/3ML SOLN Take 3 mLs by nebulization every 6 (six) hours as needed. Patient taking differently: Take 3 mLs by nebulization every 6 (six) hours as needed (shortness of breath). 06/19/15  Yes Veryl Speak, MD  linaclotide [name]) [place]  capsule Take 1 capsule (290 mcg total) by mouth daily before breakfast. Sometimes takes 2 capsules Patient taking differently: Take 290 mcg by mouth daily before breakfast. 01/16/21  Yes Carlis Stable, NP  metoprolol succinate (TOPROL-XL) 25 MG 24 hr tablet TAKE 2 TABLETS BY MOUTH IN THE MORNING AND 1 TABLET BY MOUTH IN THE EVENING. 12/05/20  Yes Satira Sark, MD  naloxone [facility]) 4 MG/0.1ML LIQD nasal spray kit Place 1 spray into the nose as needed (as directed).   Yes [provider]  nitroGLYCERIN (NITROSTAT) 0.4 MG SL tablet Place 1 tablet (0.4 mg total) under the tongue every 5 (five) minutesas needed for chest  pain. 03/07/20  Yes Satira Sark, MD  oxyCODONE-acetaminophen (PERCOCET) 7.5-325 MG tablet Take 1 tablet by mouth 4 (four) times daily as needed. 02/16/20  Yes [provider]  temazepam (RESTORIL) 30 MG capsule Take 60 mg by mouth at bedtime.   Yes [provider]  XTAMPZA ER 13.5 MG C12A Take 1 capsule by mouth 2 (two) times daily. 04/03/20  Yes [provider]    Allergies as of 07/01/2021 - Review Complete 07/01/2021  Allergen Reaction Noted   Fentanyl Other (See Comments) 05/16/2019    Family History  Problem Relation Age of Onset   COPD Father    Cancer Father    Cancer Mother    Heart attack Brother    Epilepsy Sister    Hypertension Sister    Colon cancer Neg Hx     Social History   Socioeconomic History   Marital status: Widowed    Spouse name: Not on file   Number of children: Not on file   Years of education: Not on file   Highest education level: Not on file  Occupational History   Occupation: Disabled  Tobacco Use   Smoking status: Every Day    Packs/day: 1.00    Years: 30.00    Pack years: 30.00    Types: Cigarettes    Start date: 08/28/1969   Smokeless tobacco: Never  Vaping Use   Vaping Use: Never used  Substance and Sexual Activity   Alcohol use: No    Alcohol/week: 0.0 standard drinks   Drug use: Yes    Types: Marijuana, Other-see comments    Comment: Pt uses Xanax- prn   Sexual activity: Not on file  Other Topics Concern   Not on file  Social History Narrative   Pt lives alone in [place]; on disability   Social Determinants of Health   Financial Resource Strain: Not on file  Food Insecurity: Not on file  Transportation Needs: Not on file  Physical Activity: Not on file  Stress: Not on file  Social Connections: Not on file  Intimate Partner Violence: Not on file    Review of Systems: See HPI, otherwise negative ROS  Physical Exam: BP 136/81   Pulse 72   Temp (!) 96.6 F (35.9 C) (Temporal)   Ht 5'  10" (1.778 m)   Wt 177 lb 3.2 oz (80.4 kg)   BMI 25.43 kg/m  General:   Alert,  pleasant and cooperative in NAD Lungs:  Clear throughout to auscultation.   No wheezes, crackles, or rhonchi. No acute distress. Heart:  Regular rate and rhythm; no murmurs, clicks, rubs,  or gallops. Abdomen: Non-distended, normal bowel sounds.  Well-healed midline incision.  Soft and nontender without appreciable mass or hepatosplenomegaly.  Do not appreciate a fascial defect. Pulses:  Normal pulses noted. Extremities:  Without  clubbing or edema.  Impression/Plan: Very pleasant 65 year old gentleman with chronic opioid induced constipation.  History rheumatoid arthritis; has been off Enbrel for some time.   Previous use of naproxen associated with dyspeptic symptoms.  Physical exam more suggestive of abdominal wall etiology.  I do not appreciate a hernia but he certainly could have a small 1 in evolution. History of colonic adenoma; due for surveillance colonoscopy 2023  Recommendations:  Trial of Protonix or pantoprazole 40 mg 30 minutes before breakfast each day.  Dispense 30 with 11 refills  Continue Linzess 290 daily  We will plan for a surveillance colonoscopy (history of colonic polyps/ASA 3/propofol) January/February 2023.  We will reassess when he comes for colonoscopy.  If he has persisting epigastric/periumbilical pain, may consider cross-sectional imaging.  As discussed, would establish care with a rheumatologist    Notice: This dictation was prepared with Dragon dictation along with smaller phrase technology. Any transcriptional errors that result from this process are unintentional and may not be corrected upon review.

## 2021-07-01 NOTE — Patient Instructions (Signed)
It was good seeing you again today!  Lets try Protonix or pantoprazole 40 mg 30 minutes before breakfast each day.  Dispense 30 with 11 refills  Continue Linzess 290 daily  We will plan for a surveillance colonoscopy (history of colonic polyps/ASA 3/propofol) January/February 2023  As discussed, would establish care with a rheumatologist

## 2021-07-01 NOTE — Progress Notes (Signed)
PROCEDURE NOTE:  The patient request injection, verbal consent was obtained.  The right shoulder was prepped appropriately after time out was performed.   Sterile technique was observed and injection of 1 cc of Celestone 6 mg with several cc's of plain xylocaine. Anesthesia was provided by ethyl chloride and a 20-gauge needle was used to inject the shoulder area. A posterior approach was used.  The injection was tolerated well.  A band aid dressing was applied.  The patient was advised to apply ice later today and tomorrow to the injection sight as needed.  Return in one month.  Call if any problem.  Precautions discussed.  Electronically Signed Darreld Mclean, MD 10/4/20221:37 PM

## 2021-07-22 ENCOUNTER — Other Ambulatory Visit: Payer: Self-pay | Admitting: Cardiology

## 2021-07-25 ENCOUNTER — Other Ambulatory Visit: Payer: Self-pay | Admitting: Gastroenterology

## 2021-07-25 MED ORDER — LINACLOTIDE 290 MCG PO CAPS: 290.0000 ug | ORAL_CAPSULE | Freq: Every day | ORAL | 1 refills | Status: DC

## 2021-07-29 ENCOUNTER — Ambulatory Visit (INDEPENDENT_AMBULATORY_CARE_PROVIDER_SITE_OTHER): Payer: Medicare Other | Admitting: Orthopaedic Surgery

## 2021-07-29 ENCOUNTER — Encounter: Payer: Self-pay | Admitting: Orthopaedic Surgery

## 2021-07-29 ENCOUNTER — Other Ambulatory Visit: Payer: Self-pay

## 2021-07-29 VITALS — Ht 70.0 in | Wt 177.0 lb

## 2021-07-29 DIAGNOSIS — M7022 Olecranon bursitis, left elbow: Secondary | ICD-10-CM

## 2021-07-29 DIAGNOSIS — G8929 Other chronic pain: Secondary | ICD-10-CM | POA: Diagnosis not present

## 2021-07-29 DIAGNOSIS — M25511 Pain in right shoulder: Secondary | ICD-10-CM

## 2021-07-29 NOTE — Progress Notes (Signed)
My elbow is swollen.  He has long standing right shoulder pain.  He is here for injection.  He has developed swelling of the left olecranon area.  He denies trauma.  He has no pain, no redness.  It just started swelling on its own about a week ago.  It is tender to press against it.  He has no numbness.  Left elbow has olecranon bursa size of small lime.  No pain, no redness.  NV intact. Full ROM of elbow.  Right shoulder has good ROM with pain in the extremes.  NV intact.  Grips normal.  Encounter Diagnoses  Name Primary?   Chronic right shoulder pain Yes   Olecranon bursitis of left elbow    PROCEDURE NOTE:  The patient request injection, verbal consent was obtained.  The right shoulder was prepped appropriately after time out was performed.   Sterile technique was observed and injection of 1 cc of DepoMedrol 40mg  with several cc's of plain xylocaine. Anesthesia was provided by ethyl chloride and a 20-gauge needle was used to inject the shoulder area. A posterior approach was used.  The injection was tolerated well.  A band aid dressing was applied.  The patient was advised to apply ice later today and tomorrow to the injection sight as needed.   Procedure note: After permission from patient, the left elbow was prepped.  I aspirated about 14 cc from the elbow by 16 gauge needle by sterile technique.  1 cc DepoMedrol 40 instilled.  Sterile dressing applied.  I told him the fluid my come back to the elbow and he might need excision of the bursa.  Return in one month.  Call if any problem.  Precautions discussed.  Electronically Signed , MD 11/1/20221:50 PM

## 2021-08-19 ENCOUNTER — Other Ambulatory Visit: Payer: Self-pay | Admitting: Cardiology

## 2021-08-26 ENCOUNTER — Ambulatory Visit (INDEPENDENT_AMBULATORY_CARE_PROVIDER_SITE_OTHER): Payer: Medicare Other | Admitting: Orthopaedic Surgery

## 2021-08-26 ENCOUNTER — Encounter: Payer: Self-pay | Admitting: Orthopaedic Surgery

## 2021-08-26 ENCOUNTER — Other Ambulatory Visit: Payer: Self-pay

## 2021-08-26 DIAGNOSIS — M25511 Pain in right shoulder: Secondary | ICD-10-CM

## 2021-08-26 DIAGNOSIS — G8929 Other chronic pain: Secondary | ICD-10-CM | POA: Diagnosis not present

## 2021-08-26 DIAGNOSIS — F17210 Nicotine dependence, cigarettes, uncomplicated: Secondary | ICD-10-CM

## 2021-08-26 NOTE — Patient Instructions (Signed)
Steps to Quit Smoking Smoking tobacco is the leading cause of preventable death. It can affect almost every organ in the body. Smoking puts you and people around you at risk for many serious, long-lasting (chronic) diseases. Quitting smoking can be hard, but it is one of the best things that you can do for your health. It is never too late to quit. How do I get ready to quit? When you decide to quit smoking, make a plan to help you succeed. Before you quit: Pick a date to quit. Set a date within the next 2 weeks to give you time to prepare. Write down the reasons why you are quitting. Keep this list in places where you will see it often. Tell your family, friends, and co-workers that you are quitting. Their support is important. Talk with your doctor about the choices that may help you quit. Find out if your health insurance will pay for these treatments. Know the people, places, things, and activities that make you want to smoke (triggers). Avoid them. What first steps can I take to quit smoking? Throw away all cigarettes at home, at work, and in your car. Throw away the things that you use when you smoke, such as ashtrays and lighters. Clean your car. Make sure to empty the ashtray. Clean your home, including curtains and carpets. What can I do to help me quit smoking? Talk with your doctor about taking medicines and seeing a counselor at the same time. You are more likely to succeed when you do both. If you are pregnant or breastfeeding, talk with your doctor about counseling or other ways to quit smoking. Do not take medicine to help you quit smoking unless your doctor tells you to do so. To quit smoking: Quit right away Quit smoking totally, instead of slowly cutting back on how much you smoke over a period of time. Go to counseling. You are more likely to quit if you go to counseling sessions regularly. Take medicine You may take medicines to help you quit. Some medicines need a  prescription, and some you can buy over-the-counter. Some medicines may contain a drug called nicotine to replace the nicotine in cigarettes. Medicines may: Help you to stop having the desire to smoke (cravings). Help to stop the problems that come when you stop smoking (withdrawal symptoms). Your doctor may ask you to use: Nicotine patches, gum, or lozenges. Nicotine inhalers or sprays. Non-nicotine medicine that is taken by mouth. Find resources Find resources and other ways to help you quit smoking and remain smoke-free after you quit. These resources are most helpful when you use them often. They include: Online chats with a counselor. Phone quitlines. Printed self-help materials. Support groups or group counseling. Text messaging programs. Mobile phone apps. Use apps on your mobile phone or tablet that can help you stick to your quit plan. There are many free apps for mobile phones and tablets as well as websites. Examples include Quit Guide from the CDC and smokefree.gov  What things can I do to make it easier to quit?  Talk to your family and friends. Ask them to support and encourage you. Call a phone quitline (1-800-QUIT-NOW), reach out to support groups, or work with a counselor. Ask people who smoke to not smoke around you. Avoid places that make you want to smoke, such as: Bars. Parties. Smoke-break areas at work. Spend time with people who do not smoke. Lower the stress in your life. Stress can make you want to   smoke. Try these things to help your stress: Getting regular exercise. Doing deep-breathing exercises. Doing yoga. Meditating. Doing a body scan. To do this, close your eyes, focus on one area of your body at a time from head to toe. Notice which parts of your body are tense. Try to relax the muscles in those areas. How will I feel when I quit smoking? Day 1 to 3 weeks Within the first 24 hours, you may start to have some problems that come from quitting tobacco.  These problems are very bad 2-3 days after you quit, but they do not often last for more than 2-3 weeks. You may get these symptoms: Mood swings. Feeling restless, nervous, angry, or annoyed. Trouble concentrating. Dizziness. Strong desire for high-sugar foods and nicotine. Weight gain. Trouble pooping (constipation). Feeling like you may vomit (nausea). Coughing or a sore throat. Changes in how the medicines that you take for other issues work in your body. Depression. Trouble sleeping (insomnia). Week 3 and afterward After the first 2-3 weeks of quitting, you may start to notice more positive results, such as: Better sense of smell and taste. Less coughing and sore throat. Slower heart rate. Lower blood pressure. Clearer skin. Better breathing. Fewer sick days. Quitting smoking can be hard. Do not give up if you fail the first time. Some people need to try a few times before they succeed. Do your best to stick to your quit plan, and talk with your doctor if you have any questions or concerns. Summary Smoking tobacco is the leading cause of preventable death. Quitting smoking can be hard, but it is one of the best things that you can do for your health. When you decide to quit smoking, make a plan to help you succeed. Quit smoking right away, not slowly over a period of time. When you start quitting, seek help from your doctor, family, or friends. This information is not intended to replace advice given to you by your health care provider. Make sure you discuss any questions you have with your health care provider. Document Revised: 05/23/2021 Document Reviewed: 12/03/2018 Elsevier Patient Education  2022 [employer]  

## 2021-08-26 NOTE — Progress Notes (Signed)
PROCEDURE NOTE:  The patient request injection, verbal consent was obtained.  The right shoulder was prepped appropriately after time out was performed.   Sterile technique was observed and injection of 1 cc of DepoMedrol 40mg  with several cc's of plain xylocaine. Anesthesia was provided by ethyl chloride and a 20-gauge needle was used to inject the shoulder area. A posterior approach was used.  The injection was tolerated well.  A band aid dressing was applied.  The patient was advised to apply ice later today and tomorrow to the injection sight as needed.   Encounter Diagnoses  Name Primary?   Chronic right shoulder pain Yes   Cigarette nicotine dependence without complication    Return in three weeks.  Call if any problem.  Precautions discussed.  Electronically Signed , MD 11/29/20221:33 PM

## 2021-09-16 ENCOUNTER — Other Ambulatory Visit: Payer: Self-pay

## 2021-09-16 ENCOUNTER — Encounter: Payer: Self-pay | Admitting: Orthopaedic Surgery

## 2021-09-16 ENCOUNTER — Ambulatory Visit (INDEPENDENT_AMBULATORY_CARE_PROVIDER_SITE_OTHER): Payer: Medicare Other | Admitting: Orthopaedic Surgery

## 2021-09-16 DIAGNOSIS — G8929 Other chronic pain: Secondary | ICD-10-CM | POA: Diagnosis not present

## 2021-09-16 DIAGNOSIS — F17210 Nicotine dependence, cigarettes, uncomplicated: Secondary | ICD-10-CM | POA: Diagnosis not present

## 2021-09-16 DIAGNOSIS — M25511 Pain in right shoulder: Secondary | ICD-10-CM | POA: Diagnosis not present

## 2021-09-16 NOTE — Progress Notes (Signed)
PROCEDURE NOTE:  The patient request injection, verbal consent was obtained.  The right shoulder was prepped appropriately after time out was performed.   Sterile technique was observed and injection of 1 cc of DepoMedrol 40mg  with several cc's of plain xylocaine. Anesthesia was provided by ethyl chloride and a 20-gauge needle was used to inject the shoulder area. A posterior approach was used.  The injection was tolerated well.  A band aid dressing was applied.  The patient was advised to apply ice later today and tomorrow to the injection sight as needed.   Encounter Diagnoses  Name Primary?   Chronic right shoulder pain Yes   Cigarette nicotine dependence without complication    Return in one month.  Call if any problem.  Precautions discussed.  Electronically Signed , MD 12/20/20221:39 PM

## 2021-10-02 ENCOUNTER — Other Ambulatory Visit: Payer: Self-pay

## 2021-10-02 ENCOUNTER — Emergency Department (HOSPITAL_COMMUNITY): Payer: Medicare Other

## 2021-10-02 ENCOUNTER — Inpatient Hospital Stay (HOSPITAL_COMMUNITY)
Admission: EM | Admit: 2021-10-02 | Discharge: 2021-10-05 | DRG: 190 | Disposition: A | Discharge status: Home / Self Care | Payer: Medicare Other | Attending: Internal Medicine | Admitting: Internal Medicine

## 2021-10-02 ENCOUNTER — Encounter (HOSPITAL_COMMUNITY): Payer: Self-pay | Admitting: Emergency Medicine

## 2021-10-02 DIAGNOSIS — R1031 Right lower quadrant pain: Secondary | ICD-10-CM | POA: Diagnosis present

## 2021-10-02 DIAGNOSIS — K219 Gastro-esophageal reflux disease without esophagitis: Secondary | ICD-10-CM | POA: Diagnosis present

## 2021-10-02 DIAGNOSIS — Z20822 Contact with and (suspected) exposure to covid-19: Secondary | ICD-10-CM | POA: Diagnosis present

## 2021-10-02 DIAGNOSIS — I10 Essential (primary) hypertension: Secondary | ICD-10-CM | POA: Diagnosis present

## 2021-10-02 DIAGNOSIS — Z28310 Unvaccinated for covid-19: Secondary | ICD-10-CM

## 2021-10-02 DIAGNOSIS — Z981 Arthrodesis status: Secondary | ICD-10-CM

## 2021-10-02 DIAGNOSIS — J441 Chronic obstructive pulmonary disease with (acute) exacerbation: Secondary | ICD-10-CM | POA: Diagnosis not present

## 2021-10-02 DIAGNOSIS — Z7982 Long term (current) use of aspirin: Secondary | ICD-10-CM

## 2021-10-02 DIAGNOSIS — E119 Type 2 diabetes mellitus without complications: Secondary | ICD-10-CM | POA: Diagnosis present

## 2021-10-02 DIAGNOSIS — Z820 Family history of epilepsy and other diseases of the nervous system: Secondary | ICD-10-CM

## 2021-10-02 DIAGNOSIS — I2510 Atherosclerotic heart disease of native coronary artery without angina pectoris: Secondary | ICD-10-CM | POA: Diagnosis present

## 2021-10-02 DIAGNOSIS — F17210 Nicotine dependence, cigarettes, uncomplicated: Secondary | ICD-10-CM | POA: Diagnosis present

## 2021-10-02 DIAGNOSIS — G894 Chronic pain syndrome: Secondary | ICD-10-CM | POA: Diagnosis present

## 2021-10-02 DIAGNOSIS — D72829 Elevated white blood cell count, unspecified: Secondary | ICD-10-CM | POA: Diagnosis present

## 2021-10-02 DIAGNOSIS — J9602 Acute respiratory failure with hypercapnia: Secondary | ICD-10-CM | POA: Diagnosis present

## 2021-10-02 DIAGNOSIS — J449 Chronic obstructive pulmonary disease, unspecified: Secondary | ICD-10-CM | POA: Diagnosis present

## 2021-10-02 DIAGNOSIS — J479 Bronchiectasis, uncomplicated: Secondary | ICD-10-CM | POA: Diagnosis present

## 2021-10-02 DIAGNOSIS — R0602 Shortness of breath: Secondary | ICD-10-CM

## 2021-10-02 DIAGNOSIS — J439 Emphysema, unspecified: Principal | ICD-10-CM | POA: Diagnosis present

## 2021-10-02 DIAGNOSIS — G250 Essential tremor: Secondary | ICD-10-CM | POA: Diagnosis present

## 2021-10-02 DIAGNOSIS — Z8249 Family history of ischemic heart disease and other diseases of the circulatory system: Secondary | ICD-10-CM | POA: Diagnosis not present

## 2021-10-02 DIAGNOSIS — Z885 Allergy status to narcotic agent status: Secondary | ICD-10-CM | POA: Diagnosis not present

## 2021-10-02 DIAGNOSIS — Z825 Family history of asthma and other chronic lower respiratory diseases: Secondary | ICD-10-CM

## 2021-10-02 DIAGNOSIS — Z79899 Other long term (current) drug therapy: Secondary | ICD-10-CM | POA: Diagnosis not present

## 2021-10-02 DIAGNOSIS — J9601 Acute respiratory failure with hypoxia: Secondary | ICD-10-CM

## 2021-10-02 DIAGNOSIS — I25119 Atherosclerotic heart disease of native coronary artery with unspecified angina pectoris: Secondary | ICD-10-CM | POA: Diagnosis not present

## 2021-10-02 LAB — BASIC METABOLIC PANEL
Anion gap: 8 (ref 5–15)
BUN: 13 mg/dL (ref 8–23)
CO2: 37 mmol/L — ABNORMAL HIGH (ref 22–32)
Calcium: 8.2 mg/dL — ABNORMAL LOW (ref 8.9–10.3)
Chloride: 88 mmol/L — ABNORMAL LOW (ref 98–111)
Creatinine, Ser: 0.66 mg/dL (ref 0.61–1.24)
GFR, Estimated: >60 mL/min (ref >60)
Glucose, Bld: 115 mg/dL — ABNORMAL HIGH (ref 70–99)
Potassium: 3.5 mmol/L (ref 3.5–5.1)
Sodium: 133 mmol/L — ABNORMAL LOW (ref 135–145)

## 2021-10-02 LAB — HEPATIC FUNCTION PANEL
ALT: 30 U/L (ref 0–44)
AST: 15 U/L (ref 15–41)
Albumin: 3 g/dL — ABNORMAL LOW (ref 3.5–5.0)
Alkaline Phosphatase: 111 U/L (ref 38–126)
Bilirubin, Direct: 0.1 mg/dL (ref 0.0–0.2)
Indirect Bilirubin: 0.5 mg/dL (ref 0.3–0.9)
Total Bilirubin: 0.6 mg/dL (ref 0.3–1.2)
Total Protein: 6.7 g/dL (ref 6.5–8.1)

## 2021-10-02 LAB — URINALYSIS, ROUTINE W REFLEX MICROSCOPIC
Bilirubin Urine: NEGATIVE
Glucose, UA: NEGATIVE mg/dL
Hgb urine dipstick: NEGATIVE
Ketones, ur: 5 mg/dL — AB
Leukocytes,Ua: NEGATIVE
Nitrite: NEGATIVE
Protein, ur: NEGATIVE mg/dL
Specific Gravity, Urine: 1.006 (ref 1.005–1.030)
pH: 6 (ref 5.0–8.0)

## 2021-10-02 LAB — CBC
HCT: 44.2 % (ref 39.0–52.0)
Hemoglobin: 14.1 g/dL (ref 13.0–17.0)
MCH: 31.5 pg (ref 26.0–34.0)
MCHC: 31.9 g/dL (ref 30.0–36.0)
MCV: 98.9 fL (ref 80.0–100.0)
Platelets: 337 10*3/uL (ref 150–400)
RBC: 4.47 MIL/uL (ref 4.22–5.81)
RDW: 14.1 % (ref 11.5–15.5)
WBC: 23.1 10*3/uL — ABNORMAL HIGH (ref 4.0–10.5)
nRBC: 0 % (ref 0.0–0.2)

## 2021-10-02 LAB — RESP PANEL BY RT-PCR (FLU A&B, COVID) ARPGX2
Influenza A by PCR: NEGATIVE
Influenza B by PCR: NEGATIVE
SARS Coronavirus 2 by RT PCR: NEGATIVE

## 2021-10-02 LAB — TROPONIN I (HIGH SENSITIVITY)
Troponin I (High Sensitivity): 33 ng/L — ABNORMAL HIGH (ref <18)
Troponin I (High Sensitivity): 29 ng/L — ABNORMAL HIGH (ref <18)

## 2021-10-02 LAB — MRSA NEXT GEN BY PCR, NASAL: MRSA by PCR Next Gen: NOT DETECTED

## 2021-10-02 LAB — LIPASE, BLOOD: Lipase: 20 U/L (ref 11–51)

## 2021-10-02 MED ORDER — ALBUTEROL SULFATE (2.5 MG/3ML) 0.083% IN NEBU: 2.5000 mg | INHALATION_SOLUTION | RESPIRATORY_TRACT | Status: DC | PRN

## 2021-10-02 MED ORDER — METHYLPREDNISOLONE SODIUM SUCC 40 MG IJ SOLR
40.0000 mg | Freq: Two times a day (BID) | INTRAMUSCULAR | Status: AC
  Administered 2021-10-02 – 2021-10-03 (×2): 40 mg via INTRAVENOUS
  Filled 2021-10-02 (×2): qty 1

## 2021-10-02 MED ORDER — TEMAZEPAM 15 MG PO CAPS
60.0000 mg | ORAL_CAPSULE | Freq: Every day | ORAL | Status: DC
  Administered 2021-10-02 – 2021-10-04 (×3): 60 mg via ORAL
  Filled 2021-10-02 (×3): qty 4

## 2021-10-02 MED ORDER — PREDNISONE 20 MG PO TABS
40.0000 mg | ORAL_TABLET | Freq: Every day | ORAL | Status: DC
  Administered 2021-10-04 – 2021-10-05 (×2): 40 mg via ORAL
  Filled 2021-10-02 (×2): qty 2

## 2021-10-02 MED ORDER — SODIUM CHLORIDE 0.9 % IV SOLN
1.0000 g | INTRAVENOUS | Status: DC
  Administered 2021-10-03: 1 g via INTRAVENOUS
  Filled 2021-10-02: qty 10

## 2021-10-02 MED ORDER — IPRATROPIUM-ALBUTEROL 0.5-2.5 (3) MG/3ML IN SOLN
3.0000 mL | Freq: Four times a day (QID) | RESPIRATORY_TRACT | Status: DC
  Administered 2021-10-02 – 2021-10-05 (×12): 3 mL via RESPIRATORY_TRACT
  Filled 2021-10-02 (×12): qty 3

## 2021-10-02 MED ORDER — OXYCODONE HCL 5 MG PO TABS
5.0000 mg | ORAL_TABLET | ORAL | Status: DC | PRN
  Administered 2021-10-02: 17:00:00 5 mg via ORAL
  Filled 2021-10-02: qty 1

## 2021-10-02 MED ORDER — OXYCODONE HCL ER 10 MG PO T12A
10.0000 mg | EXTENDED_RELEASE_TABLET | Freq: Two times a day (BID) | ORAL | Status: DC
  Administered 2021-10-02 – 2021-10-05 (×6): 10 mg via ORAL
  Filled 2021-10-02 (×6): qty 1

## 2021-10-02 MED ORDER — ACETAMINOPHEN 325 MG PO TABS: 650.0000 mg | ORAL_TABLET | Freq: Four times a day (QID) | ORAL | Status: DC | PRN

## 2021-10-02 MED ORDER — GUAIFENESIN-DM 100-10 MG/5ML PO SYRP
5.0000 mL | ORAL_SOLUTION | ORAL | Status: DC | PRN
  Administered 2021-10-02 – 2021-10-05 (×5): 5 mL via ORAL
  Filled 2021-10-02 (×5): qty 5

## 2021-10-02 MED ORDER — IOHEXOL 350 MG/ML SOLN
100.0000 mL | Freq: Once | INTRAVENOUS | Status: AC | PRN
  Administered 2021-10-02: 100 mL via INTRAVENOUS

## 2021-10-02 MED ORDER — SODIUM CHLORIDE 0.9 % IV SOLN
500.0000 mg | INTRAVENOUS | Status: DC
  Administered 2021-10-02 – 2021-10-03 (×2): 500 mg via INTRAVENOUS
  Filled 2021-10-02 (×2): qty 5

## 2021-10-02 MED ORDER — GABAPENTIN 300 MG PO CAPS
300.0000 mg | ORAL_CAPSULE | Freq: Three times a day (TID) | ORAL | Status: DC
  Administered 2021-10-02 – 2021-10-05 (×9): 300 mg via ORAL
  Filled 2021-10-02 (×9): qty 1

## 2021-10-02 MED ORDER — TEMAZEPAM 7.5 MG PO CAPS: 60.0000 mg | ORAL_CAPSULE | Freq: Every day | ORAL | Status: DC

## 2021-10-02 MED ORDER — BUDESONIDE 0.5 MG/2ML IN SUSP
2.0000 mg | Freq: Two times a day (BID) | RESPIRATORY_TRACT | Status: DC
  Administered 2021-10-02 – 2021-10-03 (×2): 0.5 mg via RESPIRATORY_TRACT
  Administered 2021-10-04: 2 mg via RESPIRATORY_TRACT
  Administered 2021-10-04: 0.5 mg via RESPIRATORY_TRACT
  Administered 2021-10-05: 2 mg via RESPIRATORY_TRACT
  Filled 2021-10-02 (×6): qty 8

## 2021-10-02 MED ORDER — PANTOPRAZOLE SODIUM 40 MG PO TBEC
40.0000 mg | DELAYED_RELEASE_TABLET | Freq: Every day | ORAL | Status: DC
  Administered 2021-10-02 – 2021-10-05 (×4): 40 mg via ORAL
  Filled 2021-10-02 (×5): qty 1

## 2021-10-02 MED ORDER — CHLORHEXIDINE GLUCONATE CLOTH 2 % EX PADS
6.0000 | MEDICATED_PAD | Freq: Every day | CUTANEOUS | Status: DC
  Administered 2021-10-03 – 2021-10-05 (×3): 6 via TOPICAL

## 2021-10-02 MED ORDER — ALBUTEROL SULFATE (2.5 MG/3ML) 0.083% IN NEBU
10.0000 mg | INHALATION_SOLUTION | Freq: Once | RESPIRATORY_TRACT | Status: AC
  Administered 2021-10-02: 10 mg via RESPIRATORY_TRACT
  Filled 2021-10-02: qty 12

## 2021-10-02 MED ORDER — LINACLOTIDE 145 MCG PO CAPS
290.0000 ug | ORAL_CAPSULE | Freq: Every day | ORAL | Status: DC
  Administered 2021-10-03 – 2021-10-05 (×3): 290 ug via ORAL
  Filled 2021-10-02 (×3): qty 2

## 2021-10-02 MED ORDER — GABAPENTIN 600 MG PO TABS
300.0000 mg | ORAL_TABLET | Freq: Three times a day (TID) | ORAL | Status: DC
  Filled 2021-10-02 (×2): qty 0.5

## 2021-10-02 MED ORDER — SODIUM CHLORIDE 0.9 % IV SOLN
2.0000 g | Freq: Once | INTRAVENOUS | Status: AC
  Administered 2021-10-02: 2 g via INTRAVENOUS
  Filled 2021-10-02: qty 20

## 2021-10-02 MED ORDER — METHYLPREDNISOLONE SODIUM SUCC 125 MG IJ SOLR
125.0000 mg | Freq: Once | INTRAMUSCULAR | Status: AC
  Administered 2021-10-02: 125 mg via INTRAVENOUS
  Filled 2021-10-02: qty 2

## 2021-10-02 MED ORDER — IPRATROPIUM-ALBUTEROL 0.5-2.5 (3) MG/3ML IN SOLN
3.0000 mL | Freq: Once | RESPIRATORY_TRACT | Status: AC
  Administered 2021-10-02: 3 mL via RESPIRATORY_TRACT
  Filled 2021-10-02: qty 3

## 2021-10-02 MED ORDER — HEPARIN SODIUM (PORCINE) 5000 UNIT/ML IJ SOLN
5000.0000 [IU] | Freq: Three times a day (TID) | INTRAMUSCULAR | Status: DC
  Administered 2021-10-02 – 2021-10-05 (×6): 5000 [IU] via SUBCUTANEOUS
  Filled 2021-10-02 (×8): qty 1

## 2021-10-02 MED ORDER — ACETAMINOPHEN 650 MG RE SUPP: 650.0000 mg | Freq: Four times a day (QID) | RECTAL | Status: DC | PRN

## 2021-10-02 MED ORDER — ASPIRIN EC 81 MG PO TBEC
81.0000 mg | DELAYED_RELEASE_TABLET | Freq: Every day | ORAL | Status: DC
  Administered 2021-10-02 – 2021-10-05 (×4): 81 mg via ORAL
  Filled 2021-10-02 (×4): qty 1

## 2021-10-02 MED ORDER — HYDRALAZINE HCL 20 MG/ML IJ SOLN: 5.0000 mg | Freq: Four times a day (QID) | INTRAMUSCULAR | Status: DC | PRN

## 2021-10-02 NOTE — ED Triage Notes (Signed)
Pt to the ED with shortness of breath that has gotten progressively worse since 0400 today.  Pt has had 2 deoneb treatments from EMS prior to arrival.  Pt has a history of COPD and had oxygen saturations in the mid 80's when EMS arrived.  Pt was placed on 3L O2 and sats rose to 89-90.

## 2021-10-02 NOTE — H&P (Signed)
TRH H&P   Patient Demographics:    Peter Campbell, is a 66 y.o. male  MRN: [id number]   DOB - [birth year]  Admit Date - 10/02/2021  Outpatient Primary MD for the patient is Abran Richard, MD  Referring MD/NP/PA: PA Ileene Patrick    Patient coming from: home  Chief Complaint  Patient presents with   Shortness of Breath      HPI:    Peter Campbell  is a 66 y.o. male, with past medical history of hypertension, neuropathy, essential tremors, chronic pain syndrome, tobacco abuse, COPD, patient presents to ED secondary to complaints of shortness of breath, patient reports he has been feeling sick since Christmas, he does report fever, chills, nasal congestion, productive cough, yellow phlegm, chest tightness, pleuritic chest pain, and poor appetite, he denies any hemoptysis, leg swelling, nausea, vomiting, no history of PE or DVT.  Denies any dysuria or polyuria as well. -In ED patient was noted with increased work of breathing, ABG showing PCO2 of 70, he had white blood cell count at 23K, but no evidence of pneumonia on imaging, CTA chest negative for PE or Pneumonia, patient feels some improvement after steroids and continue with nebs, [facility] consulted to admit.    Review of systems:    In addition to the HPI above,  Reports fever and chills, generalized weakness and fatigue No Headache, No changes with Vision or hearing, No problems swallowing food or Liquids, Ports chest pain related to cough, reports cough with yellow phlegm, reports dyspnea as well. No Abdominal pain, No Nausea or Vommitting, Bowel movements are regular, No Blood in stool or Urine, No dysuria, No new skin rashes or bruises, No new joints pains-aches,  No new weakness, tingling, numbness in any extremity, No recent weight gain or loss, No polyuria, polydypsia or polyphagia, No significant Mental  Stressors.  A full 10 point Review of Systems was done, except as stated above, all other Review of Systems were negative.   With Past History of the following :    Past Medical History:  Diagnosis Date   Arthritis    Chronic back pain    Collagen vascular disease ([place])    Colonic mass    History, s/p removal; per patient.   Constipation    COPD (chronic obstructive pulmonary disease) (HCC)    Coronary atherosclerosis of native coronary artery    Dense coronary atherosclerosis by chest CT November 2014    Dyspnea    when walking distance   Essential hypertension    Essential tremor    History of chicken pox    History of [place] measles    Pneumonia    2015 ish   Sciatica       Past Surgical History:  Procedure Laterality Date   CIRCUMCISION     COLON SURGERY  '80's   for mass removal, colonostomy   COLONOSCOPY  WITH PROPOFOL N/A 05/03/2017   Two 3-5 mm polyps in rectum and descending colon s/p removal, pancolonic diverticulosis, internal hemorrhoids. Benign. Surveillance due in 5 years.    colonostomy reversal  80's   LUMBAR FUSION  11/27/2019   Lumber discectomy     Middle finger Right    fracture   NASAL SEPTUM SURGERY     POLYPECTOMY  05/03/2017   Procedure: POLYPECTOMY;  Surgeon: Daneil Dolin, MD;  Location: [facility];  Service: Endoscopy;;  descending colon and rectal   SHOULDER ACROMIOPLASTY Right 06/12/2014   Procedure: RIGHT SHOULDER ACROMIOPLASTY;  Surgeon: [name], MD;  Location: [facility];  Service: Orthopedics;  Laterality: Right;   SHOULDER OPEN ROTATOR CUFF REPAIR Right 06/12/2014   Procedure: OPEN REPAIR ROTATOR CUFF RIGHT ;  Surgeon: [name], MD;  Location: [facility];  Service: Orthopedics;  Laterality: Right;   TONSILLECTOMY     TYMPANOSTOMY TUBE PLACEMENT        Social History:     Social History   Tobacco Use   Smoking status: Every Day    Packs/day: 1.00    Years: 30.00    Pack years: 30.00    Types: Cigarettes    Start date:  08/28/1969   Smokeless tobacco: Never  Substance Use Topics   Alcohol use: No    Alcohol/week: 0.0 standard drinks        Family History :     Family History  Problem Relation Age of Onset   COPD Father    Cancer Father    Cancer Mother    Heart attack Brother    Epilepsy Sister    Hypertension Sister    Colon cancer Neg Hx      Home Medications:   Prior to Admission medications   Medication Sig Start Date End Date Taking? Authorizing Provider  albuterol (PROVENTIL HFA;VENTOLIN HFA) 108 (90 Base) MCG/ACT inhaler Inhale 1-2 puffs into the lungs every 6 (six) hours as needed for wheezing or shortness of breath.    [provider]  amLODipine (NORVASC) 5 MG tablet TAKE 1 TABLET BY MOUTH ONCE DAILY. 08/19/21   Satira Sark, MD  aspirin EC 81 MG tablet Take 1 tablet (81 mg total) by mouth daily. 10/31/13   Satira Sark, MD  baclofen (LIORESAL) 10 MG tablet Take 10 mg by mouth 3 (three) times daily. 08/28/21   [provider]  Diclofenac Sodium CR 100 MG 24 hr tablet Take 100 mg by mouth daily. 09/23/21   [provider]  gabapentin (NEURONTIN) 600 MG tablet Take 600 mg by mouth 3 (three) times daily.  01/26/18   [provider]  ipratropium-albuterol (DUONEB) 0.5-2.5 (3) MG/3ML SOLN Take 3 mLs by nebulization every 6 (six) hours as needed. Patient taking differently: Take 3 mLs by nebulization every 6 (six) hours as needed (shortness of breath). 06/19/15   Veryl Speak, MD  linaclotide [facility]) 290 MCG CAPS capsule Take 1 capsule (290 mcg total) by mouth daily before breakfast. 07/25/21   Mahala Menghini, PA-C  metoprolol succinate (TOPROL-XL) 25 MG 24 hr tablet TAKE 2 TABLETS BY MOUTH IN THE MORNING AND 1 TABLET BY MOUTH IN THE EVENING. 12/05/20   Satira Sark, MD  naloxone [facility]) 4 MG/0.1ML LIQD nasal spray kit Place 1 spray into the nose as needed (as directed).    [provider]  nitroGLYCERIN (NITROSTAT) 0.4 MG SL  tablet Place 1 tablet (0.4 mg total) under the tongue every 5 (five)  minutesas needed for chest pain. 03/07/20   Satira Sark, MD  oxyCODONE-acetaminophen (PERCOCET) 7.5-325 MG tablet Take 1 tablet by mouth 4 (four) times daily as needed. 02/16/20   [provider]  pantoprazole (PROTONIX) 40 MG tablet Take 1 tablet (40 mg total) by mouth daily. 07/01/21 06/26/22  Rourk, Cristopher Estimable, MD  temazepam (RESTORIL) 30 MG capsule Take 60 mg by mouth at bedtime.    [provider]  tiZANidine (ZANAFLEX) 4 MG tablet Take 4 mg by mouth 3 (three) times daily. 07/30/21   [provider]  XTAMPZA ER 13.5 MG C12A Take 1 capsule by mouth 2 (two) times daily. 04/03/20   [provider]     Allergies:     Allergies  Allergen Reactions   Fentanyl Other (See Comments)    Ineffective      Physical Exam:   Vitals  Blood pressure 127/71, pulse 80, temperature 99.4 F (37.4 C), temperature source Oral, resp. rate 20, height 5' 10"  (1.778 m), weight 80.3 kg, SpO2 92 %.   1. General developed male, laying in bed, in mild discomfort due to dyspnea  2. Normal affect and insight, Not Suicidal or Homicidal, Awake Alert, Oriented X 3.  3. No F.N deficits, ALL C.Nerves Intact, Strength 5/5 all 4 extremities, Sensation intact all 4 extremities, Plantars down going.  4. Ears and Eyes appear Normal, Conjunctivae clear, PERRLA. Moist Oral Mucosa.  5. Supple Neck, No JVD, No cervical lymphadenopathy appriciated, No Carotid Bruits.  6. Symmetrical Chest wall movement, nester entry bilaterally with diffuse wheezing  7. RRR, No Gallops, Rubs or Murmurs, No Parasternal Heave.  8. Positive Bowel Sounds, Abdomen Soft, No tenderness, No organomegaly appriciated,No rebound -guarding or rigidity.  9.  No Cyanosis, Normal Skin Turgor, No Skin Rash or Bruise.  10. Good muscle tone,  joints appear normal , no effusions, Normal ROM.  11. No Palpable Lymph Nodes in Neck or Axillae   Data  Review:    CBC Recent Labs  Lab 10/02/21 1239  WBC 23.1*  HGB 14.1  HCT 44.2  PLT 337  MCV 98.9  MCH 31.5  MCHC 31.9  RDW 14.1   ------------------------------------------------------------------------------------------------------------------  Chemistries  Recent Labs  Lab 10/02/21 1239 10/02/21 1324  NA 133*  --   K 3.5  --   CL 88*  --   CO2 37*  --   GLUCOSE 115*  --   BUN 13  --   CREATININE 0.66  --   CALCIUM 8.2*  --   AST  --  15  ALT  --  30  ALKPHOS  --  111  BILITOT  --  0.6   ------------------------------------------------------------------------------------------------------------------ estimated creatinine clearance is 96.3 mL/min (by C-G formula based on SCr of 0.66 mg/dL). ------------------------------------------------------------------------------------------------------------------ No results for input(s): TSH, T4TOTAL, T3FREE, THYROIDAB in the last 72 hours.  Invalid input(s): FREET3  Coagulation profile No results for input(s): INR, PROTIME in the last 168 hours. ------------------------------------------------------------------------------------------------------------------- No results for input(s): DDIMER in the last 72 hours. -------------------------------------------------------------------------------------------------------------------  Cardiac Enzymes No results for input(s): CKMB, TROPONINI, MYOGLOBIN in the last 168 hours.  Invalid input(s): CK ------------------------------------------------------------------------------------------------------------------    Component Value Date/Time   BNP 65.0 06/19/2015 1331     ---------------------------------------------------------------------------------------------------------------  Urinalysis    Component Value Date/Time   COLORURINE STRAW (A) 10/02/2021 1411   APPEARANCEUR CLEAR 10/02/2021 1411   LABSPEC 1.006 10/02/2021 1411   PHURINE 6.0 10/02/2021 1411   GLUCOSEU  NEGATIVE 10/02/2021 1411   HGBUR NEGATIVE  10/02/2021 1411   BILIRUBINUR NEGATIVE 10/02/2021 1411   KETONESUR 5 (A) 10/02/2021 1411   PROTEINUR NEGATIVE 10/02/2021 1411   UROBILINOGEN 0.2 06/07/2014 0957   NITRITE NEGATIVE 10/02/2021 1411   LEUKOCYTESUR NEGATIVE 10/02/2021 1411    ----------------------------------------------------------------------------------------------------------------   Imaging Results:    CT Angio Chest PE W and/or Wo Contrast  Result Date: 10/02/2021 CLINICAL DATA:  Progressive shortness of breath and hypoxia. History of COPD. EXAM: CT ANGIOGRAPHY CHEST WITH CONTRAST TECHNIQUE: Multidetector CT imaging of the chest was performed using the standard protocol during bolus administration of intravenous contrast. Multiplanar CT image reconstructions and MIPs were obtained to evaluate the vascular anatomy. CONTRAST:  175m OMNIPAQUE IOHEXOL 350 MG/ML SOLN COMPARISON:  Low-dose screening chest CT on 06/24/2021 FINDINGS: Cardiovascular: Limited evaluation due to respiratory motion artifact throughout the scan. The central pulmonary arteries and main segmental branches are adequately assessed and demonstrate no evidence of pulmonary embolism. Smaller subsegmental branches are not well assessed due to motion artifact. Central pulmonary arteries are dilated with the main pulmonary artery measuring up to 3.3 cm. The thoracic aorta demonstrates normal caliber without dissection or significant atherosclerosis. The heart size is within normal limits. No pericardial fluid identified. Calcified coronary artery plaque present. Mediastinum/Nodes: There are some scattered small mediastinal and hilar lymph nodes bilaterally with the mediastinal nodes appearing similar to the prior CT. No enlarged mediastinal, hilar, or axillary lymph nodes. Thyroid gland, trachea, and esophagus demonstrate no significant findings. Lungs/Pleura: Respiratory motion severely limits evaluation. Stable emphysema. No  gross consolidation. There may be areas of bronchial thickening in both lungs. Chronic scarring in the right lung appears stable. Stable nodularity at the right apex. More prominent atelectasis in the inferior lingula. Upper Abdomen: No acute abnormality. Musculoskeletal: No chest wall abnormality. No acute or significant osseous findings. Review of the MIP images confirms the above findings. IMPRESSION: 1. Limited evaluation due to respiratory motion throughout the scan. No evidence of gross pulmonary embolism. 2. Evidence of pulmonary hypertension with dilated central pulmonary arteries. 3. Stable emphysema with no gross consolidation. There may be bronchial thickening in both lungs which could be mostly chronic. Component of acute bronchitis cannot be excluded. Increased atelectasis in the inferior lingula compared to the prior screening CT. Emphysema (ICD10-J43.9). Electronically Signed   By: GAletta EdouardM.D.   On: 10/02/2021 14:20   DG Chest Port 1 View  Result Date: 10/02/2021 CLINICAL DATA:  Short of breath. Cough and congestion. COPD history EXAM: PORTABLE CHEST 1 VIEW COMPARISON:  06/19/2015 FINDINGS: Heart size upper normal. Negative for heart failure. Lungs are clear without infiltrate or effusion. Prominent lung markings appear chronic. IMPRESSION: No active disease. Electronically Signed   By: CFranchot GalloM.D.   On: 10/02/2021 12:35    My personal review of EKG: Rhythm NSR, 89 bpm with a QTC of 447.     Assessment & Plan:    Principal Problem:   COPD exacerbation ([place] Active Problems:   Essential hypertension, benign   Coronary atherosclerosis of native coronary artery   Coronary artery calcification seen on CAT scan   Chronic pain syndrome   Chronic obstructive pulmonary disease with (acute) exacerbation (HCC)   Acute hypercapnic respiratory failure due to COPD exacerbation -Patient presents with increased work of breathing, diminished air entry bilaterally and significant  wheezing, CTA chest with no evidence of PE, no evidence of pneumonia. -Patient will be admitted to stepdown, his hypercapnic respiratory failure appears to be with a chronic component as his bicarb is 37,  and pH 3.5.  He will be started on BiPAP for couple hours, then likely BiPAP overnight, will repeat ABG in a.m.Marland Kitchen -He is admitted under COPD pathway, will start on steroids, but desonide, scheduled duo nebs and as needed albuterol. -I will start on antibiotics doxycycline and Rocephin given leukocytosis, productive phlegm, no evidence of pneumonia or bridging.  Leukocytosis -Most likely stress related, pneumonia on imaging, UA is negative . -We will cycle procalcitonin .  Pleuritic chest pain-nontypical  -Related to cough, CTA negative for PE, EKG nonacute, will cycle troponins and monitor on telemetry .  Hypertension  -Blood pressure is acceptable to soft, so I will hold home medications, especially beta-blockers due to COPD and will keep on as needed hydralazine, blood pressure started to increase he will be resumed on his home meds.  .  Tobacco abuse -He was counseled   DVT Prophylaxis Heparin -   AM Labs Ordered, also please review Full Orders  Family Communication: Admission, patients condition and plan of care including tests being ordered have been discussed with the patient  who indicate understanding and agree with the plan and Code Status.  Code Status Full  Likely DC to  home  Condition GUARDED    Consults called: none    Admission status: inpatient    Time spent in minutes : 65 minutes   Phillips Climes M.D on 10/02/2021 at 4:01 PM   [facility] - Office  [phone]

## 2021-10-02 NOTE — ED Notes (Signed)
Patient transported to CT 

## 2021-10-02 NOTE — ED Provider Notes (Signed)
[facility] EMERGENCY DEPARTMENT Provider Note   CSN: [id number] Arrival date & time: 10/02/21  1127     History  Chief Complaint  Patient presents with   Shortness of Breath    Peter Campbell is a 66 y.o. male.  HPI  Patient with medical history including CAD, COPD, current tobacco user, presents with chief complaint of shortness of breath.  Patient states he has been sick since Christmas, states that he thinks he has gotten sick from one of his family members.  He endorses fevers, chills, nasal ingestion, productive cough, chest tightness, pleuritic chest pain, and decreased appetite.  He denies general body aches, nausea, vomiting, stomach pains, constipation or diarrhea, denies any urinary symptoms.  States he has been taking his inhalers not much relief, has also tried Mucinex with also not much relief. he has no history of PEs or DVTs denies any unilateral leg swelling.  Patient is not vaccinated against COVID or influenza, is not immunocompromise, he has only complaints at this time.  Denies any alleviating or aggravating factors.  Home Medications Prior to Admission medications   Medication Sig Start Date End Date Taking? Authorizing Provider  albuterol (PROVENTIL HFA;VENTOLIN HFA) 108 (90 Base) MCG/ACT inhaler Inhale 1-2 puffs into the lungs every 6 (six) hours as needed for wheezing or shortness of breath.    [provider]  amLODipine (NORVASC) 5 MG tablet TAKE 1 TABLET BY MOUTH ONCE DAILY. 08/19/21   Satira Sark, MD  aspirin EC 81 MG tablet Take 1 tablet (81 mg total) by mouth daily. 10/31/13   Satira Sark, MD  baclofen (LIORESAL) 10 MG tablet Take 10 mg by mouth 3 (three) times daily. 08/28/21   [provider]  Diclofenac Sodium CR 100 MG 24 hr tablet Take 100 mg by mouth daily. 09/23/21   [provider]  gabapentin (NEURONTIN) 600 MG tablet Take 600 mg by mouth 3 (three) times daily.  01/26/18   [provider]   ipratropium-albuterol (DUONEB) 0.5-2.5 (3) MG/3ML SOLN Take 3 mLs by nebulization every 6 (six) hours as needed. Patient taking differently: Take 3 mLs by nebulization every 6 (six) hours as needed (shortness of breath). 06/19/15   Veryl Speak, MD  linaclotide [facility]) 290 MCG CAPS capsule Take 1 capsule (290 mcg total) by mouth daily before breakfast. 07/25/21   Mahala Menghini, PA-C  metoprolol succinate (TOPROL-XL) 25 MG 24 hr tablet TAKE 2 TABLETS BY MOUTH IN THE MORNING AND 1 TABLET BY MOUTH IN THE EVENING. 12/05/20   Satira Sark, MD  naloxone [facility]) 4 MG/0.1ML LIQD nasal spray kit Place 1 spray into the nose as needed (as directed).    [provider]  nitroGLYCERIN (NITROSTAT) 0.4 MG SL tablet Place 1 tablet (0.4 mg total) under the tongue every 5 (five) minutesas needed for chest pain. 03/07/20   Satira Sark, MD  oxyCODONE-acetaminophen (PERCOCET) 7.5-325 MG tablet Take 1 tablet by mouth 4 (four) times daily as needed. 02/16/20   [provider]  pantoprazole (PROTONIX) 40 MG tablet Take 1 tablet (40 mg total) by mouth daily. 07/01/21 06/26/22  Rourk, Cristopher Estimable, MD  temazepam (RESTORIL) 30 MG capsule Take 60 mg by mouth at bedtime.    [provider]  tiZANidine (ZANAFLEX) 4 MG tablet Take 4 mg by mouth 3 (three) times daily. 07/30/21   [provider]  XTAMPZA ER 13.5 MG C12A Take 1 capsule by mouth 2 (two) times daily. 04/03/20   [provider]      Allergies    Fentanyl    Review of Systems   Review of Systems  Constitutional:  Positive for appetite change, chills and fever.  HENT:  Positive for congestion. Negative for sore throat.   Respiratory:  Positive for cough.   Cardiovascular:  Negative for chest pain and palpitations.  Gastrointestinal:  Negative for diarrhea, nausea and vomiting.  Musculoskeletal:  Negative for myalgias.  Neurological:  Negative for headaches.   Physical Exam Updated Vital Signs BP 127/71 (BP  Location: Right Arm)    Pulse 80    Temp 99.4 F (37.4 C) (Oral)    Resp 20    Ht $R'5\' 10"'qC$  (1.778 m)    Wt 80.3 kg    SpO2 92%    BMI 25.40 kg/m  Physical Exam Vitals and nursing note reviewed.  Constitutional:      General: He is not in acute distress.    Appearance: He is not ill-appearing.  HENT:     Head: Normocephalic and atraumatic.     Nose: Nose normal. No congestion.     Mouth/Throat:     Mouth: Mucous membranes are moist.     Pharynx: Oropharynx is clear. No oropharyngeal exudate or posterior oropharyngeal erythema.  Eyes:     Conjunctiva/sclera: Conjunctivae normal.  Cardiovascular:     Rate and Rhythm: Normal rate and regular rhythm.     Pulses: Normal pulses.     Heart sounds: No murmur heard.   No friction rub. No gallop.  Pulmonary:     Effort: No respiratory distress.     Breath sounds: No wheezing, rhonchi or rales.     Comments: During my exam patient did not appear to be in respiratory distress, he is noted be tachypneic, and on 4 L via nasal cannula.  O2 was taken off during my exam became hypoxic in the upper 80s, placed back on 2 L now on the mid 90s.  Patient has tight sounding chest, expiratory wheezing present, no rales or rhonchi present. Abdominal:     Palpations: Abdomen is soft.     Tenderness: There is abdominal tenderness. There is no right CVA tenderness or left CVA tenderness.     Comments: Abdomen nondistended, normal bowel sounds, dull to percussion, has slight generalized tenderness, there is no guarding, rebound, peritoneal sign.  Musculoskeletal:     Right lower leg: No edema.     Left lower leg: No edema.  Skin:    General: Skin is warm and dry.  Neurological:     Mental Status: He is alert.  Psychiatric:        Mood and Affect: Mood normal.    ED Results / Procedures / Treatments   Labs (all labs ordered are listed, but only abnormal results are displayed) Labs Reviewed  BASIC METABOLIC PANEL - Abnormal; Notable for the following  components:      Result Value   Sodium 133 (*)    Chloride 88 (*)    CO2 37 (*)    Glucose, Bld 115 (*)    Calcium 8.2 (*)    All other components within normal limits  CBC - Abnormal; Notable for the following components:   WBC 23.1 (*)    All other components within normal limits  BLOOD GAS, VENOUS - Abnormal; Notable for the following components:   pCO2, Ven 70.4 (*)    pO2, Ven 49.3 (*)    Bicarbonate 33.2 (*)    Acid-Base Excess 12.4 (*)  All other components within normal limits  HEPATIC FUNCTION PANEL - Abnormal; Notable for the following components:   Albumin 3.0 (*)    All other components within normal limits  URINALYSIS, ROUTINE W REFLEX MICROSCOPIC - Abnormal; Notable for the following components:   Color, Urine STRAW (*)    Ketones, ur 5 (*)    All other components within normal limits  RESP PANEL BY RT-PCR (FLU A&B, COVID) ARPGX2  CULTURE, BLOOD (ROUTINE X 2)  CULTURE, BLOOD (ROUTINE X 2)  LIPASE, BLOOD    EKG EKG Interpretation  Date/Time:  Thursday October 02 2021 12:11:12 EST Ventricular Rate:  89 PR Interval:  142 QRS Duration: 93 QT Interval:  367 QTC Calculation: 447 R Axis:   88 Text Interpretation: Sinus rhythm Borderline right axis deviation No significant change since prior 8/20 Confirmed by Aletta Edouard [phone]) on 10/02/2021 12:15:15 PM  Radiology CT Angio Chest PE W and/or Wo Contrast  Result Date: 10/02/2021 CLINICAL DATA:  Progressive shortness of breath and hypoxia. History of COPD. EXAM: CT ANGIOGRAPHY CHEST WITH CONTRAST TECHNIQUE: Multidetector CT imaging of the chest was performed using the standard protocol during bolus administration of intravenous contrast. Multiplanar CT image reconstructions and MIPs were obtained to evaluate the vascular anatomy. CONTRAST:  131m OMNIPAQUE IOHEXOL 350 MG/ML SOLN COMPARISON:  Low-dose screening chest CT on 06/24/2021 FINDINGS: Cardiovascular: Limited evaluation due to respiratory motion artifact  throughout the scan. The central pulmonary arteries and main segmental branches are adequately assessed and demonstrate no evidence of pulmonary embolism. Smaller subsegmental branches are not well assessed due to motion artifact. Central pulmonary arteries are dilated with the main pulmonary artery measuring up to 3.3 cm. The thoracic aorta demonstrates normal caliber without dissection or significant atherosclerosis. The heart size is within normal limits. No pericardial fluid identified. Calcified coronary artery plaque present. Mediastinum/Nodes: There are some scattered small mediastinal and hilar lymph nodes bilaterally with the mediastinal nodes appearing similar to the prior CT. No enlarged mediastinal, hilar, or axillary lymph nodes. Thyroid gland, trachea, and esophagus demonstrate no significant findings. Lungs/Pleura: Respiratory motion severely limits evaluation. Stable emphysema. No gross consolidation. There may be areas of bronchial thickening in both lungs. Chronic scarring in the right lung appears stable. Stable nodularity at the right apex. More prominent atelectasis in the inferior lingula. Upper Abdomen: No acute abnormality. Musculoskeletal: No chest wall abnormality. No acute or significant osseous findings. Review of the MIP images confirms the above findings. IMPRESSION: 1. Limited evaluation due to respiratory motion throughout the scan. No evidence of gross pulmonary embolism. 2. Evidence of pulmonary hypertension with dilated central pulmonary arteries. 3. Stable emphysema with no gross consolidation. There may be bronchial thickening in both lungs which could be mostly chronic. Component of acute bronchitis cannot be excluded. Increased atelectasis in the inferior lingula compared to the prior screening CT. Emphysema (ICD10-J43.9). Electronically Signed   By: GAletta EdouardM.D.   On: 10/02/2021 14:20   DG Chest Port 1 View  Result Date: 10/02/2021 CLINICAL DATA:  Short of breath.  Cough and congestion. COPD history EXAM: PORTABLE CHEST 1 VIEW COMPARISON:  06/19/2015 FINDINGS: Heart size upper normal. Negative for heart failure. Lungs are clear without infiltrate or effusion. Prominent lung markings appear chronic. IMPRESSION: No active disease. Electronically Signed   By: CFranchot GalloM.D.   On: 10/02/2021 12:35    Procedures .Critical Care Performed by: FMarcello Fennel PA-C Authorized by: FMarcello Fennel PA-C   Critical care provider statement:  Critical care time (minutes):  30   Critical care time was exclusive of:  Separately billable procedures and treating other patients   Critical care was necessary to treat or prevent imminent or life-threatening deterioration of the following conditions:  Respiratory failure   Critical care was time spent personally by me on the following activities:  Discussions with consultants, evaluation of patient's response to treatment, examination of patient, ordering and review of laboratory studies, ordering and review of radiographic studies, ordering and performing treatments and interventions, pulse oximetry, re-evaluation of patient's condition and review of old charts   I assumed direction of critical care for this patient from another provider in my specialty: no     Care discussed with: admitting provider      Medications Ordered in ED Medications  cefTRIAXone (ROCEPHIN) 2 g in sodium chloride 0.9 % 100 mL IVPB (2 g Intravenous New Bag/Given 10/02/21 1550)  azithromycin (ZITHROMAX) 500 mg in sodium chloride 0.9 % 250 mL IVPB (has no administration in time range)  ipratropium-albuterol (DUONEB) 0.5-2.5 (3) MG/3ML nebulizer solution 3 mL (3 mLs Nebulization Given 10/02/21 1409)  methylPREDNISolone sodium succinate (SOLU-MEDROL) 125 mg/2 mL injection 125 mg (125 mg Intravenous Given 10/02/21 1335)  iohexol (OMNIPAQUE) 350 MG/ML injection 100 mL (100 mLs Intravenous Contrast Given 10/02/21 1353)  albuterol (PROVENTIL) (2.5  MG/3ML) 0.083% nebulizer solution 10 mg (10 mg Nebulization Given 10/02/21 1536)    ED Course/ Medical Decision Making/ A&P                           Medical Decision Making  This patient presents to the ED for concern of cough, this involves an extensive number of treatment options, and is a complaint that carries with it a high risk of complications and morbidity.  The differential diagnosis includes pneumonia, PE, COVID    Additional history obtained:  Additional history obtained from electronic medical records    Co morbidities that complicate the patient evaluation  COPD  Social Determinants of Health:  N/A    Lab Tests:  I Ordered, and personally interpreted labs.  The pertinent results include: CBC shows leukocytosis of 23.1 BMP shows sodium 133 calcium 88 CO2 37 glucose 115 calcium 8.2 hepatic function panel reveals albumin 3, lipase is 20, UA is unremarkable, VBG shows PCO2 of 70, PCO2 of 49.3, bicarb 33.   Imaging Studies ordered:  I ordered imaging studies including chest x-ray, CTA of chest. I independently visualized and interpreted imaging which showed unremarkable, CT of chest reveals no signs of PE, does show signs of possible pulmonary hypertension, stable emphysema component consistent with acute bronchitis. I agree with the radiologist interpretation   Cardiac Monitoring:  The patient was maintained on a cardiac monitor.  I personally viewed and interpreted the cardiac monitored which showed an underlying rhythm of: EKG sinus without signs of ischemia   Medicines ordered and prescription drug management:  I ordered medication including DuoNeb for likely COPD exacerbation Reevaluation of the patient after these medicines showed that the patient improved I have reviewed the patients home medicines and have made adjustments as needed  Critical Interventions:  Patient placed on 2 L via nasal cannula, provide a DuoNeb as well as a continuous  neb.   Reevaluation:  After the interventions noted above, I reevaluated the patient and found that they have :improved  Reassessed after first DuoNeb, lung sounds have improved, but he still tight sounding my exam and wheezing, will start  him on a continuous neb.   Lab work shows leukocytosis, imaging is consistent with acute bronchitis, will start him on antibiotics, and recommend admission as she has acute respiratory failure likely secondary due to COPD exacerbation patient agreed in this plan.  Consultations Obtained:  I requested consultation with Dr. Waldron [facility] of the hospitalist team,  and discussed lab and imaging findings as well as pertinent plan - they recommend: Admission, they will come down and assessed the patient.    Test Considered:  Dissection study but will defer as presentation is a 2 of etiology.    Rule out I have low suspicion for ACS as history is atypical, patient has no cardiac history, EKG was sinus rhythm without signs of ischemia, troponins were deferred as patient endorsing chest pain at this time.  Low suspicion for PE and/or dissection/aneurysm CT imaging is negative for acute findings.  Low suspicion for pneumothorax patient equal lung sounds bilaterally, no acute findings seen on imaging.  I have low suspicion patient will need to be placed on BiPAP as he is alert and oriented, not somnolent, show no acute respiratory distress, normal pH. I suspect elevated CO2 is secondary due to acute respiratory failure suspect with bronchial dilation and 7 oxygen this will improve on its own.  Low suspicion for systemic infection as patient is nontoxic-appearing, vital signs reassuring, no obvious source infection noted on exam.     Dispostion:  After consideration of the diagnostic results and the patients response to treatment, I feel that the patent would benefit from hospital admission for treatment of acute respiratory failure likely secondary to COPD  exacerbation  Problem List / ED Course:  Acute respiratory failure with hypoxia and hypercapnia COPD exacerbation          Final Clinical Impression(s) / ED Diagnoses Final diagnoses:  SOB (shortness of breath)  Acute respiratory failure with hypoxia and hypercapnia ([place])  COPD exacerbation [facility])    Rx / DC Orders ED Discharge Orders     None         Marcello Fennel, PA-C 10/02/21 1557    Hayden Rasmussen, MD 10/02/21 1943

## 2021-10-03 DIAGNOSIS — I25119 Atherosclerotic heart disease of native coronary artery with unspecified angina pectoris: Secondary | ICD-10-CM

## 2021-10-03 DIAGNOSIS — G894 Chronic pain syndrome: Secondary | ICD-10-CM | POA: Diagnosis not present

## 2021-10-03 DIAGNOSIS — J9601 Acute respiratory failure with hypoxia: Secondary | ICD-10-CM | POA: Diagnosis not present

## 2021-10-03 DIAGNOSIS — J441 Chronic obstructive pulmonary disease with (acute) exacerbation: Secondary | ICD-10-CM

## 2021-10-03 DIAGNOSIS — I10 Essential (primary) hypertension: Secondary | ICD-10-CM

## 2021-10-03 DIAGNOSIS — J9602 Acute respiratory failure with hypercapnia: Secondary | ICD-10-CM

## 2021-10-03 LAB — CBC
HCT: 40.8 % (ref 39.0–52.0)
Hemoglobin: 13.3 g/dL (ref 13.0–17.0)
MCH: 32.4 pg (ref 26.0–34.0)
MCHC: 32.6 g/dL (ref 30.0–36.0)
MCV: 99.3 fL (ref 80.0–100.0)
Platelets: 315 10*3/uL (ref 150–400)
RBC: 4.11 MIL/uL — ABNORMAL LOW (ref 4.22–5.81)
RDW: 14.6 % (ref 11.5–15.5)
WBC: 16.6 10*3/uL — ABNORMAL HIGH (ref 4.0–10.5)
nRBC: 0 % (ref 0.0–0.2)

## 2021-10-03 LAB — BLOOD GAS, VENOUS
Acid-Base Excess: 12.4 mmol/L — ABNORMAL HIGH (ref 0.0–2.0)
Bicarbonate: 33.2 mmol/L — ABNORMAL HIGH (ref 20.0–28.0)
Drawn by: 61882
FIO2: 32
O2 Saturation: 79.5 %
Patient temperature: 37.4
pCO2, Ven: 70.4 mmHg (ref 44.0–60.0)
pH, Ven: 7.357 (ref 7.250–7.430)
pO2, Ven: 49.3 mmHg — ABNORMAL HIGH (ref 32.0–45.0)

## 2021-10-03 LAB — BLOOD GAS, ARTERIAL
Acid-Base Excess: 11.8 mmol/L — ABNORMAL HIGH (ref 0.0–2.0)
Bicarbonate: 33.7 mmol/L — ABNORMAL HIGH (ref 20.0–28.0)
Drawn by: 38235
FIO2: 40
O2 Saturation: 94.3 %
Patient temperature: 37
pCO2 arterial: 65.4 mmHg (ref 32.0–48.0)
pH, Arterial: 7.376 (ref 7.350–7.450)
pO2, Arterial: 78.7 mmHg — ABNORMAL LOW (ref 83.0–108.0)

## 2021-10-03 LAB — BASIC METABOLIC PANEL
Anion gap: 9 (ref 5–15)
BUN: 12 mg/dL (ref 8–23)
CO2: 35 mmol/L — ABNORMAL HIGH (ref 22–32)
Calcium: 8.4 mg/dL — ABNORMAL LOW (ref 8.9–10.3)
Chloride: 92 mmol/L — ABNORMAL LOW (ref 98–111)
Creatinine, Ser: 0.73 mg/dL (ref 0.61–1.24)
GFR, Estimated: >60 mL/min (ref >60)
Glucose, Bld: 186 mg/dL — ABNORMAL HIGH (ref 70–99)
Potassium: 3.9 mmol/L (ref 3.5–5.1)
Sodium: 136 mmol/L (ref 135–145)

## 2021-10-03 LAB — HIV ANTIBODY (ROUTINE TESTING W REFLEX): HIV Screen 4th Generation wRfx: NONREACTIVE

## 2021-10-03 MED ORDER — ARFORMOTEROL TARTRATE 15 MCG/2ML IN NEBU
15.0000 ug | INHALATION_SOLUTION | Freq: Two times a day (BID) | RESPIRATORY_TRACT | Status: DC
  Administered 2021-10-03 – 2021-10-05 (×5): 15 ug via RESPIRATORY_TRACT
  Filled 2021-10-03 (×5): qty 2

## 2021-10-03 NOTE — Care Management Important Message (Signed)
Important Message  Patient Details  Name: Peter Campbell MRN: [id number] Date of Birth: [birth year]   Medicare Important Message Given:  Yes     Corey Harold 10/03/2021, 4:23 PM

## 2021-10-03 NOTE — Progress Notes (Signed)
PROGRESS NOTE    Peter Campbell  YNW:[id number] DOB: 03/13/1956 DOA: 10/02/2021 PCP: Alvina FilbertHunter, Denise, MD    Chief Complaint  Patient presents with   Shortness of Breath    Brief Narrative:  As per H&P written by Dr. Randol KernElgergawy on 10/02/2021 Joesphine BareRicky Campbell  is a 66 y.o. male, with past medical history of hypertension, neuropathy, essential tremors, chronic pain syndrome, tobacco abuse, COPD, patient presents to ED secondary to complaints of shortness of breath, patient reports he has been feeling sick since Christmas, he does report fever, chills, nasal congestion, productive cough, yellow phlegm, chest tightness, pleuritic chest pain, and poor appetite, he denies any hemoptysis, leg swelling, nausea, vomiting, no history of PE or DVT.  Denies any dysuria or polyuria as well. -In ED patient was noted with increased work of breathing, ABG showing PCO2 of 70, he had white blood cell count at 23K, but no evidence of pneumonia on imaging, CTA chest negative for PE or infiltrates, patient feels some improvement after steroids and continue nebs, [facility] hospitalist consulted to admit.   Assessment & Plan: Acute respiratory failure with hypoxia and hypercapnia due to COPD exacerbation -Transient use of BiPAP at time of admission for CO2 of almost 80 on presentation. -Patient may have a component of chronic CO2 retention, given elevated bicarb levels at time of admission. -Still short of breath, having difficulty speaking in full sentences, supplementation. -Will continue treatment with antibiotics, steroids, nebulizer management and nightly BiPAP. -Patient will be transfer to telemetry bed. -Continue the use of flutter valve mucolytic and supportive care.  Essential hypertension, benign -Stable overall -Continue current antihypertensive regimen.  Coronary atherosclerosis of native coronary artery -No chest pain -EKG and telemetry appears to be stable -Chest discomfort reported at time of admission  associated with coughing spells. -CT angiogram negative for PE and troponins demonstrating no acute ischemic process.  Chronic pain syndrome -Continue home analgesics regimen.  Tobacco abuse -Cessation counseling has been provided -Continue to patch.  Leukocytosis -In the setting of problem #1 with most likely bronchiectasis -Already demonstrated improvement and appropriately trending down.   DVT prophylaxis: Heparin Code Status: Full code. Family Communication: No family at bedside. Disposition:   Status is: Inpatient    Consultants:  None  Procedures:  See below for x-ray reports.  Antimicrobials:  Rocephin and Zithromax.  Subjective: Expressing intermittent coughing spells; having difficulty speaking in full sentences and using 4 L nasal cannula supplementation.  Positive bilateral expiratory wheezing and rhonchi.  Tachypnea appreciated on exertion.  Patient expressed feeling short winded with activity.  Objective: Vitals:   10/03/21 1000 10/03/21 1100 10/03/21 1200 10/03/21 1600  BP: (!) 100/49  126/72   Pulse: 93  91   Resp: (!) 26  19   Temp:  98.2 F (36.8 C)  98.4 F (36.9 C)  TempSrc:  Oral  Oral  SpO2: 95%  93%   Weight:      Height:        Intake/Output Summary (Last 24 hours) at 10/03/2021 1720 Last data filed at 10/03/2021 1513 Gross per 24 hour  Intake 98.73 ml  Output 600 ml  Net -501.27 ml   Filed Weights   10/02/21 1142 10/03/21 0500  Weight: 80.3 kg 76 kg    Examination:  General exam: Appears calmer; no CP currently.  Still having difficulty speaking in full sentences; no nausea, no vomiting, no fever. Respiratory system: Positive bilateral rhonchi; expiratory wheezing and fair air movement.  Intermittent tachypnea appreciated on exertion.  Requiring 4 L nasal cannula supplementation. Cardiovascular system: S1 & S2 heard, no rubs, no gallops, no JVD on exam. Gastrointestinal system: Abdomen is nondistended, soft and nontender. No  organomegaly or masses felt. Normal bowel sounds heard. Central nervous system: Alert and oriented. No focal neurological deficits. Extremities: No cyanosis or clubbing. Skin: No petechiae. Psychiatry: Judgement and insight appear normal. Mood & affect appropriate.     Data Reviewed: I have personally reviewed following labs and imaging studies  CBC: Recent Labs  Lab 10/02/21 1239 10/03/21 0450  WBC 23.1* 16.6*  HGB 14.1 13.3  HCT 44.2 40.8  MCV 98.9 99.3  PLT 337 315    Basic Metabolic Panel: Recent Labs  Lab 10/02/21 1239 10/03/21 0450  NA 133* 136  K 3.5 3.9  CL 88* 92*  CO2 37* 35*  GLUCOSE 115* 186*  BUN 13 12  CREATININE 0.66 0.73  CALCIUM 8.2* 8.4*    GFR: Estimated Creatinine Clearance: 96.3 mL/min (by C-G formula based on SCr of 0.73 mg/dL).  Liver Function Tests: Recent Labs  Lab 10/02/21 1324  AST 15  ALT 30  ALKPHOS 111  BILITOT 0.6  PROT 6.7  ALBUMIN 3.0*    CBG: No results for input(s): GLUCAP in the last 168 hours.   Recent Results (from the past 240 hour(s))  Blood culture (routine x 2)     Status: None (Preliminary result)   Collection Time: 10/02/21  1:02 PM   Specimen: Left Antecubital; Blood  Result Value Ref Range Status   Specimen Description LEFT ANTECUBITAL  Final   Special Requests   Final    BOTTLES DRAWN AEROBIC AND ANAEROBIC Blood Culture adequate volume   Culture   Final    NO GROWTH < 24 HOURS Performed at [facility], [address] [place], [place] [address]    Report Status PENDING  Incomplete  Blood culture (routine x 2)     Status: None (Preliminary result)   Collection Time: 10/02/21  1:07 PM   Specimen: BLOOD RIGHT HAND  Result Value Ref Range Status   Specimen Description BLOOD RIGHT HAND  Final   Special Requests   Final    BOTTLES DRAWN AEROBIC AND ANAEROBIC Blood Culture adequate volume   Culture   Final    NO GROWTH < 24 HOURS Performed at [facility], [address] [place], [place] [address]     Report Status PENDING  Incomplete  Resp Panel by RT-PCR (Flu A&B, Covid) Nasopharyngeal Swab     Status: None   Collection Time: 10/02/21  1:51 PM   Specimen: Nasopharyngeal Swab; Nasopharyngeal(NP) swabs in vial transport medium  Result Value Ref Range Status   SARS Coronavirus 2 by RT PCR NEGATIVE NEGATIVE Final    Comment: (NOTE) SARS-CoV-2 target nucleic acids are NOT DETECTED.  The SARS-CoV-2 RNA is generally detectable in upper respiratory specimens during the acute phase of infection. The lowest concentration of SARS-CoV-2 viral copies this assay can detect is 138 copies/mL. A negative result does not preclude SARS-Cov-2 infection and should not be used as the sole basis for treatment or other patient management decisions. A negative result may occur with  improper specimen collection/handling, submission of specimen other than nasopharyngeal swab, presence of viral mutation(s) within the areas targeted by this assay, and inadequate number of viral copies(<138 copies/mL). A negative result must be combined with clinical observations, patient history, and epidemiological information. The expected result is Negative.  Fact Sheet for Patients:  BloggerCourse.comhttps://www.fda.gov/media/152166/download  Fact Sheet for  Healthcare Providers:  SeriousBroker.it  This test is no t yet approved or cleared by the [place] and  has been authorized for detection and/or diagnosis of SARS-CoV-2 by FDA under an Emergency Use Authorization (EUA). This EUA will remain  in effect (meaning this test can be used) for the duration of the COVID-19 declaration under Section 564(b)(1) of the Act, 21 U.S.C.section 360bbb-3(b)(1), unless the authorization is terminated  or revoked sooner.       Influenza A by PCR NEGATIVE NEGATIVE Final   Influenza B by PCR NEGATIVE NEGATIVE Final    Comment: (NOTE) The Xpert Xpress SARS-CoV-2/FLU/RSV plus assay is intended as an aid in the  diagnosis of influenza from Nasopharyngeal swab specimens and should not be used as a sole basis for treatment. Nasal washings and aspirates are unacceptable for Xpert Xpress SARS-CoV-2/FLU/RSV testing.  Fact Sheet for Patients: BloggerCourse.com  Fact Sheet for Healthcare Providers: SeriousBroker.it  This test is not yet approved or cleared by the [place] FDA and has been authorized for detection and/or diagnosis of SARS-CoV-2 by FDA under an Emergency Use Authorization (EUA). This EUA will remain in effect (meaning this test can be used) for the duration of the COVID-19 declaration under Section 564(b)(1) of the Act, 21 U.S.C. section 360bbb-3(b)(1), unless the authorization is terminated or revoked.  Performed at [facility], [address], [place], [place] [address]   MRSA Next Gen by PCR, Nasal     Status: None   Collection Time: 10/02/21  4:53 PM   Specimen: Nasal Mucosa; Nasal Swab  Result Value Ref Range Status   MRSA by PCR Next Gen NOT DETECTED NOT DETECTED Final    Comment: (NOTE) The GeneXpert MRSA Assay (FDA approved for NASAL specimens only), is one component of a comprehensive MRSA colonization surveillance program. It is not intended to diagnose MRSA infection nor to guide or monitor treatment for MRSA infections. Test performance is not FDA approved in patients less than 37 years old. Performed at [facility], [address], [place], [place] [address]     Radiology Studies: CT Angio Chest PE W and/or Wo Contrast  Result Date: 10/02/2021 CLINICAL DATA:  Progressive shortness of breath and hypoxia. History of COPD. EXAM: CT ANGIOGRAPHY CHEST WITH CONTRAST TECHNIQUE: Multidetector CT imaging of the chest was performed using the standard protocol during bolus administration of intravenous contrast. Multiplanar CT image reconstructions and MIPs were obtained to evaluate the vascular anatomy. CONTRAST:   OMNIPAQUE IOHEXOL 350 MG/ML SOLN COMPARISON:  Low-dose screening chest CT on 06/24/2021 FINDINGS: Cardiovascular: Limited evaluation due to respiratory motion artifact throughout the scan. The central pulmonary arteries and main segmental branches are adequately assessed and demonstrate no evidence of pulmonary embolism. Smaller subsegmental branches are not well assessed due to motion artifact. Central pulmonary arteries are dilated with the main pulmonary artery measuring up to 3.3 cm. The thoracic aorta demonstrates normal caliber without dissection or significant atherosclerosis. The heart size is within normal limits. No pericardial fluid identified. Calcified coronary artery plaque present. Mediastinum/Nodes: There are some scattered small mediastinal and hilar lymph nodes bilaterally with the mediastinal nodes appearing similar to the prior CT. No enlarged mediastinal, hilar, or axillary lymph nodes. Thyroid gland, trachea, and esophagus demonstrate no significant findings. Lungs/Pleura: Respiratory motion severely limits evaluation. Stable emphysema. No gross consolidation. There may be areas of bronchial thickening in both lungs. Chronic scarring in the right lung appears stable. Stable nodularity at the right apex. More prominent atelectasis in the inferior  lingula. Upper Abdomen: No acute abnormality. Musculoskeletal: No chest wall abnormality. No acute or significant osseous findings. Review of the MIP images confirms the above findings. IMPRESSION: 1. Limited evaluation due to respiratory motion throughout the scan. No evidence of gross pulmonary embolism. 2. Evidence of pulmonary hypertension with dilated central pulmonary arteries. 3. Stable emphysema with no gross consolidation. There may be bronchial thickening in both lungs which could be mostly chronic. Component of acute bronchitis cannot be excluded. Increased atelectasis in the inferior lingula compared to the prior screening CT. Emphysema  (ICD10-J43.9). Electronically Signed   By: Irish Lack M.D.   On: 10/02/2021 14:20   DG Chest Port 1 View  Result Date: 10/02/2021 CLINICAL DATA:  Short of breath. Cough and congestion. COPD history EXAM: PORTABLE CHEST 1 VIEW COMPARISON:  06/19/2015 FINDINGS: Heart size upper normal. Negative for heart failure. Lungs are clear without infiltrate or effusion. Prominent lung markings appear chronic. IMPRESSION: No active disease. Electronically Signed   By: Marlan Palau M.D.   On: 10/02/2021 12:35     Scheduled Meds:  arformoterol  15 mcg Nebulization BID   aspirin EC  81 mg Oral Daily   budesonide (PULMICORT) nebulizer solution  2 mg Nebulization Q12H   Chlorhexidine Gluconate Cloth  6 each Topical Q0600   gabapentin  300 mg Oral TID   heparin  5,000 Units Subcutaneous Q8H   ipratropium-albuterol  3 mL Nebulization Q6H   linaclotide  290 mcg Oral QAC breakfast   oxyCODONE  10 mg Oral Q12H   pantoprazole  40 mg Oral Daily   [START ON 10/04/2021] predniSONE  40 mg Oral Q breakfast   temazepam  60 mg Oral QHS   Continuous Infusions:  azithromycin 500 mg (10/03/21 1619)   cefTRIAXone (ROCEPHIN)  IV Stopped (10/03/21 1427)     LOS: 1 day     Vassie Loll, MD [facility]   To contact the attending provider between 7A-7P or the covering provider during after hours 7P-7A, please log into the web site [email or web address] and access using universal [place] password for that web site. If you do not have the password, please call the hospital operator.  10/03/2021, 5:20 PM

## 2021-10-04 ENCOUNTER — Inpatient Hospital Stay (HOSPITAL_COMMUNITY): Payer: Medicare Other

## 2021-10-04 DIAGNOSIS — J441 Chronic obstructive pulmonary disease with (acute) exacerbation: Secondary | ICD-10-CM | POA: Diagnosis not present

## 2021-10-04 DIAGNOSIS — I25119 Atherosclerotic heart disease of native coronary artery with unspecified angina pectoris: Secondary | ICD-10-CM | POA: Diagnosis not present

## 2021-10-04 DIAGNOSIS — G894 Chronic pain syndrome: Secondary | ICD-10-CM | POA: Diagnosis not present

## 2021-10-04 DIAGNOSIS — J9601 Acute respiratory failure with hypoxia: Secondary | ICD-10-CM | POA: Diagnosis not present

## 2021-10-04 MED ORDER — DOXYCYCLINE HYCLATE 100 MG PO TABS
100.0000 mg | ORAL_TABLET | Freq: Two times a day (BID) | ORAL | Status: DC
  Administered 2021-10-04 – 2021-10-05 (×3): 100 mg via ORAL
  Filled 2021-10-04 (×3): qty 1

## 2021-10-04 MED ORDER — IOHEXOL 300 MG/ML  SOLN
100.0000 mL | Freq: Once | INTRAMUSCULAR | Status: AC | PRN
  Administered 2021-10-04: 100 mL via INTRAVENOUS

## 2021-10-04 MED ORDER — SACCHAROMYCES BOULARDII 250 MG PO CAPS
250.0000 mg | ORAL_CAPSULE | Freq: Two times a day (BID) | ORAL | Status: DC
  Administered 2021-10-04 – 2021-10-05 (×3): 250 mg via ORAL
  Filled 2021-10-04 (×3): qty 1

## 2021-10-04 MED ORDER — IOHEXOL 9 MG/ML PO SOLN
500.0000 mL | ORAL | Status: AC
  Administered 2021-10-04 (×2): 500 mL via ORAL

## 2021-10-04 NOTE — Progress Notes (Signed)
SATURATION QUALIFICATIONS: (This note is used to comply with regulatory documentation for home oxygen)  Patient Saturations on Room Air at Rest = 88%  Patient Saturations on Room Air while Ambulating = 84%  Patient Saturations on 3 Liters of oxygen while Ambulating = 91%  Please briefly explain why patient needs home oxygen: Pts o2 dropped to 84% with ambulation was able to get up to 91% on 3L.

## 2021-10-04 NOTE — Progress Notes (Signed)
PROGRESS NOTE    Campbell Peter A Musil  RUE:[id number] DOB: 06/10/1956 DOA: 10/02/2021 PCP: Alvina FilbertHunter, Denise, MD    Chief Complaint  Patient presents with   Shortness of Breath    Brief Narrative:  As per H&P written by Dr. Randol KernElgergawy on 10/02/2021 Peter BareRicky Campbell  is a 66 y.o. male, with past medical history of hypertension, neuropathy, essential tremors, chronic pain syndrome, tobacco abuse, COPD, patient presents to ED secondary to complaints of shortness of breath, patient reports he has been feeling sick since Christmas, he does report fever, chills, nasal congestion, productive cough, yellow phlegm, chest tightness, pleuritic chest pain, and poor appetite, he denies any hemoptysis, leg swelling, nausea, vomiting, no history of PE or DVT.  Denies any dysuria or polyuria as well. -In ED patient was noted with increased work of breathing, ABG showing PCO2 of 70, he had white blood cell count at 23K, but no evidence of pneumonia on imaging, CTA chest negative for PE or infiltrates, patient feels some improvement after steroids and continue nebs, [facility] hospitalist consulted to admit.   Assessment & Plan: Acute respiratory failure with hypoxia and hypercapnia due to COPD exacerbation -Transient use of BiPAP at time of admission for CO2 of almost 80 on presentation. -Patient may have a component of chronic CO2 retention, given elevated bicarb levels at time of admission. -Still short of breath (but improving), still having mild difficulty speaking in full sentences, supplementation. -Will continue treatment with antibiotics (now transitioning to oral doxycycline), steroids, nebulizer management and nightly BiPAP. -Continue the use of flutter valve mucolytic and supportive care. -Will check desaturation screening.  Essential hypertension, benign -Stable overall. -Continue current antihypertensive regimen.  Coronary atherosclerosis of native coronary artery -No chest pain -EKG and telemetry appears to be  stable -Chest discomfort reported at time of admission associated with coughing spells. -CT angiogram negative for PE and troponin demonstrating no acute ischemic process.  Chronic pain syndrome -Continue home analgesics regimen.  Tobacco abuse -Cessation counseling has been provided -Continue to patch. -Patient contemplating quitting.  Leukocytosis -In the setting of problem #1 with most likely bronchiectasis component -Already demonstrated improvement and appropriately trending down. -Continue current antibiotic recs.  Right flank/right lower quadrant pain -CT scan has been ordered -Continue as needed analgesia -Follow results.   DVT prophylaxis: Heparin Code Status: Full code. Family Communication: No family at bedside. Disposition:   Status is: Inpatient    Consultants:  None  Procedures:  See below for x-ray reports.  Antimicrobials:  Rocephin and Zithromax (10/02/21>>>10/04/21) Doxycycline 1/7.  Subjective: Still complaining of intermittent coughing spells and mild difficulty speaking in full sentences.  No nausea, no vomiting, no chest pain.  Patient reporting right lower quadrant and right flank pain.  Using 4 L nasal cannula supplementation.  Objective: Vitals:   10/04/21 0318 10/04/21 0518 10/04/21 0811 10/04/21 1346  BP:  134/69  132/67  Pulse: 74 79  78  Resp: 20 20  19   Temp:  98.8 F (37.1 C)  98.6 F (37 C)  TempSrc:  Oral  Oral  SpO2: 95% 95% 94% 95%  Weight:      Height:        Intake/Output Summary (Last 24 hours) at 10/04/2021 1441 Last data filed at 10/04/2021 1300 Gross per 24 hour  Intake 1028.4 ml  Output --  Net 1028.4 ml   Filed Weights   10/02/21 1142 10/03/21 0500  Weight: 80.3 kg 76 kg    Examination: General exam: Alert, awake, oriented x 3; still  with mild difficulty speaking in full sentences; intermittent coughing spells and diffuse wheezing.  Patient complaining of right flank and right lower abdominal pain.  Reports no  nausea no vomiting. Respiratory system: Positive rhonchi, expiratory wheezing and no using accessory muscle. Cardiovascular system:RRR. No murmurs, rubs, gallops.  No JVD on exam. Gastrointestinal system: Abdomen is nondistended, soft and mildly tender to palpation in right lower quadrant and flank area.  No guarding.  No masses appreciated. Normal bowel sounds heard. Central nervous system: Alert and oriented. No focal neurological deficits. Extremities: No cyanosis or clubbing. Skin: No petechiae. Psychiatry: Judgement and insight appear normal. Mood & affect appropriate.    Data Reviewed: I have personally reviewed following labs and imaging studies  CBC: Recent Labs  Lab 10/02/21 1239 10/03/21 0450  WBC 23.1* 16.6*  HGB 14.1 13.3  HCT 44.2 40.8  MCV 98.9 99.3  PLT 337 315    Basic Metabolic Panel: Recent Labs  Lab 10/02/21 1239 10/03/21 0450  NA 133* 136  K 3.5 3.9  CL 88* 92*  CO2 37* 35*  GLUCOSE 115* 186*  BUN 13 12  CREATININE 0.66 0.73  CALCIUM 8.2* 8.4*    GFR: Estimated Creatinine Clearance: 96.3 mL/min (by C-G formula based on SCr of 0.73 mg/dL).  Liver Function Tests: Recent Labs  Lab 10/02/21 1324  AST 15  ALT 30  ALKPHOS 111  BILITOT 0.6  PROT 6.7  ALBUMIN 3.0*    CBG: No results for input(s): GLUCAP in the last 168 hours.   Recent Results (from the past 240 hour(s))  Blood culture (routine x 2)     Status: None (Preliminary result)   Collection Time: 10/02/21  1:02 PM   Specimen: Left Antecubital; Blood  Result Value Ref Range Status   Specimen Description LEFT ANTECUBITAL  Final   Special Requests   Final    BOTTLES DRAWN AEROBIC AND ANAEROBIC Blood Culture adequate volume   Culture   Final    NO GROWTH 2 DAYS Performed at [facility], [address] [place], [place] [address]    Report Status PENDING  Incomplete  Blood culture (routine x 2)     Status: None (Preliminary result)   Collection Time: 10/02/21  1:07 PM   Specimen:  BLOOD RIGHT HAND  Result Value Ref Range Status   Specimen Description BLOOD RIGHT HAND  Final   Special Requests   Final    BOTTLES DRAWN AEROBIC AND ANAEROBIC Blood Culture adequate volume   Culture   Final    NO GROWTH 2 DAYS Performed at [facility], [address], [place], [place] [address]    Report Status PENDING  Incomplete  Resp Panel by RT-PCR (Flu A&B, Covid) Nasopharyngeal Swab     Status: None   Collection Time: 10/02/21  1:51 PM   Specimen: Nasopharyngeal Swab; Nasopharyngeal(NP) swabs in vial transport medium  Result Value Ref Range Status   SARS Coronavirus 2 by RT PCR NEGATIVE NEGATIVE Final    Comment: (NOTE) SARS-CoV-2 target nucleic acids are NOT DETECTED.  The SARS-CoV-2 RNA is generally detectable in upper respiratory specimens during the acute phase of infection. The lowest concentration of SARS-CoV-2 viral copies this assay can detect is 138 copies/mL. A negative result does not preclude SARS-Cov-2 infection and should not be used as the sole basis for treatment or other patient management decisions. A negative result may occur with  improper specimen collection/handling, submission of specimen other than nasopharyngeal swab, presence of viral mutation(s) within the areas  targeted by this assay, and inadequate number of viral copies(<138 copies/mL). A negative result must be combined with clinical observations, patient history, and epidemiological information. The expected result is Negative.  Fact Sheet for Patients:  BloggerCourse.com  Fact Sheet for Healthcare Providers:  SeriousBroker.it  This test is no t yet approved or cleared by the [place] FDA and  has been authorized for detection and/or diagnosis of SARS-CoV-2 by FDA under an Emergency Use Authorization (EUA). This EUA will remain  in effect (meaning this test can be used) for the duration of the COVID-19 declaration under Section  564(b)(1) of the Act, 21 U.S.C.section 360bbb-3(b)(1), unless the authorization is terminated  or revoked sooner.       Influenza A by PCR NEGATIVE NEGATIVE Final   Influenza B by PCR NEGATIVE NEGATIVE Final    Comment: (NOTE) The Xpert Xpress SARS-CoV-2/FLU/RSV plus assay is intended as an aid in the diagnosis of influenza from Nasopharyngeal swab specimens and should not be used as a sole basis for treatment. Nasal washings and aspirates are unacceptable for Xpert Xpress SARS-CoV-2/FLU/RSV testing.  Fact Sheet for Patients: BloggerCourse.com  Fact Sheet for Healthcare Providers: SeriousBroker.it  This test is not yet approved or cleared by the [place] FDA and has been authorized for detection and/or diagnosis of SARS-CoV-2 by FDA under an Emergency Use Authorization (EUA). This EUA will remain in effect (meaning this test can be used) for the duration of the COVID-19 declaration under Section 564(b)(1) of the Act, 21 U.S.C. section 360bbb-3(b)(1), unless the authorization is terminated or revoked.  Performed at [facility], [address] [place], [place] [address]   MRSA Next Gen by PCR, Nasal     Status: None   Collection Time: 10/02/21  4:53 PM   Specimen: Nasal Mucosa; Nasal Swab  Result Value Ref Range Status   MRSA by PCR Next Gen NOT DETECTED NOT DETECTED Final    Comment: (NOTE) The GeneXpert MRSA Assay (FDA approved for NASAL specimens only), is one component of a comprehensive MRSA colonization surveillance program. It is not intended to diagnose MRSA infection nor to guide or monitor treatment for MRSA infections. Test performance is not FDA approved in patients less than 61 years old. Performed at [facility], [address] [place], [place] [address]     Radiology Studies: No results found.   Scheduled Meds:  arformoterol  15 mcg Nebulization BID   aspirin EC  81 mg Oral Daily   budesonide  (PULMICORT) nebulizer solution  2 mg Nebulization Q12H   Chlorhexidine Gluconate Cloth  6 each Topical Q0600   doxycycline  100 mg Oral Q12H   gabapentin  300 mg Oral TID   heparin  5,000 Units Subcutaneous Q8H   ipratropium-albuterol  3 mL Nebulization Q6H   linaclotide  290 mcg Oral QAC breakfast   oxyCODONE  10 mg Oral Q12H   pantoprazole  40 mg Oral Daily   predniSONE  40 mg Oral Q breakfast   saccharomyces boulardii  250 mg Oral BID   temazepam  60 mg Oral QHS   Continuous Infusions:    LOS: 2 days     Vassie Loll, MD [facility]   To contact the attending provider between 7A-7P or the covering provider during after hours 7P-7A, please log into the web site [email or web address] and access using universal [place] password for that web site. If you do not have the password, please call the hospital operator.  10/04/2021, 2:41 PM

## 2021-10-05 DIAGNOSIS — J9601 Acute respiratory failure with hypoxia: Secondary | ICD-10-CM | POA: Diagnosis not present

## 2021-10-05 DIAGNOSIS — I10 Essential (primary) hypertension: Secondary | ICD-10-CM | POA: Diagnosis not present

## 2021-10-05 DIAGNOSIS — G894 Chronic pain syndrome: Secondary | ICD-10-CM | POA: Diagnosis not present

## 2021-10-05 DIAGNOSIS — K219 Gastro-esophageal reflux disease without esophagitis: Secondary | ICD-10-CM

## 2021-10-05 DIAGNOSIS — J441 Chronic obstructive pulmonary disease with (acute) exacerbation: Secondary | ICD-10-CM | POA: Diagnosis not present

## 2021-10-05 LAB — CBC
HCT: 44.9 % (ref 39.0–52.0)
Hemoglobin: 14.2 g/dL (ref 13.0–17.0)
MCH: 31.1 pg (ref 26.0–34.0)
MCHC: 31.6 g/dL (ref 30.0–36.0)
MCV: 98.2 fL (ref 80.0–100.0)
Platelets: 376 10*3/uL (ref 150–400)
RBC: 4.57 MIL/uL (ref 4.22–5.81)
RDW: 14.6 % (ref 11.5–15.5)
WBC: 12.3 10*3/uL — ABNORMAL HIGH (ref 4.0–10.5)
nRBC: 0 % (ref 0.0–0.2)

## 2021-10-05 LAB — BASIC METABOLIC PANEL
Anion gap: 8 (ref 5–15)
BUN: 16 mg/dL (ref 8–23)
CO2: 35 mmol/L — ABNORMAL HIGH (ref 22–32)
Calcium: 9 mg/dL (ref 8.9–10.3)
Chloride: 94 mmol/L — ABNORMAL LOW (ref 98–111)
Creatinine, Ser: 0.74 mg/dL (ref 0.61–1.24)
GFR, Estimated: >60 mL/min (ref >60)
Glucose, Bld: 95 mg/dL (ref 70–99)
Potassium: 4.2 mmol/L (ref 3.5–5.1)
Sodium: 137 mmol/L (ref 135–145)

## 2021-10-05 MED ORDER — SACCHAROMYCES BOULARDII 250 MG PO CAPS: 250.0000 mg | ORAL_CAPSULE | Freq: Two times a day (BID) | ORAL | 0 refills | Status: DC

## 2021-10-05 MED ORDER — BUDESONIDE-FORMOTEROL FUMARATE 160-4.5 MCG/ACT IN AERO: 2.0000 | INHALATION_SPRAY | Freq: Two times a day (BID) | RESPIRATORY_TRACT | 3 refills | Status: DC

## 2021-10-05 MED ORDER — PREDNISONE 20 MG PO TABS: ORAL_TABLET | ORAL | 0 refills | Status: DC

## 2021-10-05 MED ORDER — GUAIFENESIN-DM 100-10 MG/5ML PO SYRP: 5.0000 mL | ORAL_SOLUTION | Freq: Four times a day (QID) | ORAL | 0 refills | Status: DC | PRN

## 2021-10-05 MED ORDER — NICOTINE 21 MG/24HR TD PT24
21.0000 mg | MEDICATED_PATCH | TRANSDERMAL | 1 refills | Status: DC
Start: 2021-10-05 — End: 2022-05-05

## 2021-10-05 MED ORDER — DOXYCYCLINE HYCLATE 100 MG PO TABS
100.0000 mg | ORAL_TABLET | Freq: Two times a day (BID) | ORAL | 0 refills | Status: AC
Start: 2021-10-05 — End: 2021-10-10

## 2021-10-05 NOTE — Discharge Summary (Signed)
Physician Discharge Summary  Peter Campbell ZOX:[id number] DOB: 1956-05-12 DOA: 10/02/2021  PCP: Peter Richard, MD  Admit date: 10/02/2021 Discharge date: 10/05/2021  Time spent: 35 minutes  Recommendations for Outpatient Follow-up:  Repeat basic metabolic panel to follow electrolytes and renal function Repeat chest x-ray in 6 weeks to assure resolution of infiltrates Outpatient follow-up with pulmonology service for PFTs and a sleep study Continue assisting patient with a smoking cessation.  Discharge Diagnoses:  Principal Problem:   COPD exacerbation ([place]) Active Problems:   Essential hypertension, benign   Coronary atherosclerosis of native coronary artery   Coronary artery calcification seen on CAT scan   Chronic pain syndrome   Chronic obstructive pulmonary disease with (acute) exacerbation (HCC)   Acute respiratory failure with hypoxia and hypercapnia (HCC)   Gastroesophageal reflux disease   Discharge Condition: Stable and improved.  Discharged home with instruction to follow-up with PCP in 10 days; outpatient follow-up with pulmonologist also recommended.  CODE STATUS: Full code.  Diet recommendation: Heart healthy diet.  Filed Weights   10/02/21 1142 10/03/21 0500  Weight: 80.3 kg 76 kg    History of present illness:  As per H&P written by Dr. Waldron Campbell on 10/02/2021 Peter Campbell  is a 66 y.o. male, with past medical history of hypertension, neuropathy, essential tremors, chronic pain syndrome, tobacco abuse, COPD, patient presents to ED secondary to complaints of shortness of breath, patient reports he has been feeling sick since Christmas, he does report fever, chills, nasal congestion, productive cough, yellow phlegm, chest tightness, pleuritic chest pain, and poor appetite, he denies any hemoptysis, leg swelling, nausea, vomiting, no history of PE or DVT.  Denies any dysuria or polyuria as well. -In ED patient was noted with increased work of breathing, ABG showing PCO2 of  70, he had white blood cell count at 23K, but no evidence of pneumonia on imaging, CTA chest negative for PE or infiltrates, patient feels some improvement after steroids and continue nebs, [facility] consulted to admit.  Hospital Course:  Acute respiratory failure with hypoxia and hypercapnia due to COPD exacerbation -Transient use of BiPAP at time of admission for CO2 of almost 80 on presentation. -Still short of breath (but improving), still having mild difficulty speaking in full sentences, supplementation. -Will continue treatment with doxycycline to complete therapy, steroids tapering, initiation of Symbicort and continue as needed rescue inhalers/nebulizer. -Continue the use of flutter valve, mucolytic and bronchodilator therapy. -Desaturation screening has demonstrated the need of 3 L of oxygen supplementation at rest and with exertion. -We will recommend outpatient follow-up with pulmonology for PFTs and a sleep study.   Essential hypertension, benign -Stable overall. -Continue current antihypertensive regimen. -Diabetes   Coronary atherosclerosis of native coronary artery -No chest pain -EKG and telemetry appears to be stable -Chest discomfort reported at time of admission associated with coughing spells. -CT angiogram negative for PE and with normal troponin/EKG not demonstrating acute ischemic process. -Continue outpatient follow-up with cardiology service.   Chronic pain syndrome -Continue home analgesics regimen. -Continue outpatient follow-up with pain clinic management.   Tobacco abuse -Cessation counseling has been provided -Continue to to use nicotine patch.  Patch. -Patient contemplating quitting.   Leukocytosis -In the setting of problem #1 with most likely bronchiectasis component -Appropriately improved and close to normal at discharge -WBCs 12.1 currently; patient reports no fever -Complete antibiotic therapy as mentioned in problem #1   Right  flank/right lower quadrant pain -CT scan has demonstrated no acute abnormalities that would explain patient's  symptoms. -Chronic changes from previous orthopedic surgeries appreciated in his lower back. -Continue home analgesics regimen.    Procedures: See below for x-ray reports.  Consultations: None  Discharge Exam: Vitals:   10/05/21 0838 10/05/21 1338  BP:  132/74  Pulse:  82  Resp:  18  Temp:  98.2 F (36.8 C)  SpO2: 90% 95%    General: No fever, no chest pain, no nausea, no palpitation.  Reports significant improvement in his breathing and is maintaining good saturation on 3 L nasal cannula supplementation.  Still intermittent coughing spells reported but overall able to speak in full sentences and expressing good control of his symptoms. Cardiovascular: S1 and S2, no rubs, no gallops, no JVD. Respiratory: Improved air movement; positive scattered rhonchi, positive expiratory wheezing. Abdomen: Soft, nontender, positive bowel sounds Extremities: No cyanosis or clubbing.  Discharge Instructions   Discharge Instructions     Diet - low sodium heart healthy   Complete by: As directed    Discharge instructions   Complete by: As directed    Take medications as prescribed Maintain adequate hydration Is stop smoking Arrange follow-up with PCP in 10 days Follow-up with pulmonology as instructed (contact office for appointment details).      Allergies as of 10/05/2021       Reactions   Fentanyl Other (See Comments)   Ineffective        Medication List     STOP taking these medications    amLODipine 5 MG tablet Commonly known as: NORVASC   baclofen 10 MG tablet Commonly known as: LIORESAL   tiZANidine 4 MG tablet Commonly known as: ZANAFLEX       TAKE these medications    albuterol 108 (90 Base) MCG/ACT inhaler Commonly known as: VENTOLIN HFA Inhale 1-2 puffs into the lungs every 6 (six) hours as needed for wheezing or shortness of breath.    aspirin EC 81 MG tablet Take 1 tablet (81 mg total) by mouth daily.   budesonide-formoterol 160-4.5 MCG/ACT inhaler Commonly known as: Symbicort Inhale 2 puffs into the lungs 2 (two) times daily.   Diclofenac Sodium CR 100 MG 24 hr tablet Take 100 mg by mouth daily.   doxycycline 100 MG tablet Commonly known as: VIBRA-TABS Take 1 tablet (100 mg total) by mouth every 12 (twelve) hours for 5 days.   gabapentin 600 MG tablet Commonly known as: NEURONTIN Take 600 mg by mouth 3 (three) times daily.   guaiFENesin-dextromethorphan 100-10 MG/5ML syrup Commonly known as: ROBITUSSIN DM Take 5 mLs by mouth every 6 (six) hours as needed for cough.   ipratropium-albuterol 0.5-2.5 (3) MG/3ML Soln Commonly known as: DUONEB Take 3 mLs by nebulization every 6 (six) hours as needed. What changed: reasons to take this   linaclotide 290 MCG Caps capsule Commonly known as: Linzess Take 1 capsule (290 mcg total) by mouth daily before breakfast.   metoprolol succinate 25 MG 24 hr tablet Commonly known as: TOPROL-XL TAKE 2 TABLETS BY MOUTH IN THE MORNING AND 1 TABLET BY MOUTH IN THE EVENING. What changed: See the new instructions.   Narcan 4 MG/0.1ML Liqd nasal spray kit Generic drug: naloxone Place 1 spray into the nose as needed (as directed).   nicotine 21 mg/24hr patch Commonly known as: NICODERM CQ - dosed in mg/24 hours Place 1 patch (21 mg total) onto the skin daily.   nitroGLYCERIN 0.4 MG SL tablet Commonly known as: NITROSTAT Place 1 tablet (0.4 mg total) under the tongue every 5 (five) minutesas  needed for chest pain.   oxyCODONE-acetaminophen 7.5-325 MG tablet Commonly known as: PERCOCET Take 1 tablet by mouth 4 (four) times daily as needed.   pantoprazole 40 MG tablet Commonly known as: PROTONIX Take 1 tablet (40 mg total) by mouth daily.   predniSONE 20 MG tablet Commonly known as: DELTASONE Take 3 tablets by mouth daily x1 day; then 2 tablet by mouth daily x2 days;  then 1 tablet by mouth daily x3 days; then half tablet by mouth daily x3 days and stop prednisone.   saccharomyces boulardii 250 MG capsule Commonly known as: FLORASTOR Take 1 capsule (250 mg total) by mouth 2 (two) times daily.   temazepam 30 MG capsule Commonly known as: RESTORIL Take 60 mg by mouth at bedtime.   Xtampza ER 13.5 MG C12a Generic drug: oxyCODONE ER Take 1 capsule by mouth 2 (two) times daily.               Durable Medical Equipment  (From admission, onward)           Start     Ordered   10/05/21 0806  For home use only DME oxygen  Once       Question Answer Comment  Length of Need 12 Months   Mode or (Route) Nasal cannula   Liters per Minute 3   Frequency Continuous (stationary and portable oxygen unit needed)   Oxygen conserving device Yes   Oxygen delivery system Gas      10/05/21 0805           Allergies  Allergen Reactions   Fentanyl Other (See Comments)    Ineffective     Follow-up Information     Peter Richard, MD. Schedule an appointment as soon as possible for a visit in 10 day(s).   Specialty: Internal Medicine Contact information: [address] [place] [address] [phone]         Satira Sark, MD .   Specialty: Cardiology Contact information: [place] [address] [phone]         Tanda Rockers, MD. Schedule an appointment as soon as possible for a visit in 2 week(s).   Specialty: Pulmonary Disease Contact information: [place] [address] [phone]                 The results of significant diagnostics from this hospitalization (including imaging, microbiology, ancillary and laboratory) are listed below for reference.    Significant Diagnostic Studies: CT Angio Chest PE W and/or Wo Contrast  Result Date: 10/02/2021 CLINICAL DATA:  Progressive shortness of breath and hypoxia. History of COPD. EXAM: CT ANGIOGRAPHY CHEST WITH  CONTRAST TECHNIQUE: Multidetector CT imaging of the chest was performed using the standard protocol during bolus administration of intravenous contrast. Multiplanar CT image reconstructions and MIPs were obtained to evaluate the vascular anatomy. CONTRAST:  144m OMNIPAQUE IOHEXOL 350 MG/ML SOLN COMPARISON:  Low-dose screening chest CT on 06/24/2021 FINDINGS: Cardiovascular: Limited evaluation due to respiratory motion artifact throughout the scan. The central pulmonary arteries and main segmental branches are adequately assessed and demonstrate no evidence of pulmonary embolism. Smaller subsegmental branches are not well assessed due to motion artifact. Central pulmonary arteries are dilated with the main pulmonary artery measuring up to 3.3 cm. The thoracic aorta demonstrates normal caliber without dissection or significant atherosclerosis. The heart size is within normal limits. No pericardial fluid identified. Calcified coronary artery plaque present. Mediastinum/Nodes: There are some scattered  small mediastinal and hilar lymph nodes bilaterally with the mediastinal nodes appearing similar to the prior CT. No enlarged mediastinal, hilar, or axillary lymph nodes. Thyroid gland, trachea, and esophagus demonstrate no significant findings. Lungs/Pleura: Respiratory motion severely limits evaluation. Stable emphysema. No gross consolidation. There may be areas of bronchial thickening in both lungs. Chronic scarring in the right lung appears stable. Stable nodularity at the right apex. More prominent atelectasis in the inferior lingula. Upper Abdomen: No acute abnormality. Musculoskeletal: No chest wall abnormality. No acute or significant osseous findings. Review of the MIP images confirms the above findings. IMPRESSION: 1. Limited evaluation due to respiratory motion throughout the scan. No evidence of gross pulmonary embolism. 2. Evidence of pulmonary hypertension with dilated central pulmonary arteries. 3. Stable  emphysema with no gross consolidation. There may be bronchial thickening in both lungs which could be mostly chronic. Component of acute bronchitis cannot be excluded. Increased atelectasis in the inferior lingula compared to the prior screening CT. Emphysema (ICD10-J43.9). Electronically Signed   By: Aletta Edouard M.D.   On: 10/02/2021 14:20   CT ABDOMEN PELVIS W CONTRAST  Result Date: 10/04/2021 CLINICAL DATA:  Right upper quadrant pain for 6 months. Shortness of breath. Reported previous mass in the stomach. EXAM: CT ABDOMEN AND PELVIS WITH CONTRAST TECHNIQUE: Multidetector CT imaging of the abdomen and pelvis was performed using the standard protocol following bolus administration of intravenous contrast. CONTRAST:  148m OMNIPAQUE IOHEXOL 300 MG/ML  SOLN COMPARISON:  CT a chest, 10/02/2021. CT abdomen pelvis, 03/29/2020. Since there is no renal mass left duct folic 1 raising 3 at select FINDINGS: Lower chest: No acute abnormality. Hepatobiliary: Liver normal in size and overall attenuation. Small amount of focal fat adjacent to the falciform ligament. Gallbladder contracted, otherwise unremarkable. No bile duct dilation. Pancreas: Unremarkable. No pancreatic ductal dilatation or surrounding inflammatory changes. Spleen: Normal in size without focal abnormality. Adrenals/Urinary Tract: No adrenal masses. Kidneys normal in size, orientation and position with symmetric enhancement and excretion. No renal mass, stone or hydronephrosis. Normal ureters. Normal bladder. Stomach/Bowel: Normal stomach. Small bowel and colon are normal in caliber. No wall thickening. No inflammation. No evidence of appendicitis. Vascular/Lymphatic: 1 aortic atherosclerosis. No aneurysm. No enlarged lymph nodes. Reproductive: Unremarkable. Other: No abdominal wall hernia or abnormality. No abdominopelvic ascites. Musculoskeletal: No fracture or acute finding. Previous L4-L5 posterior lumbar spine fusion, stable. No bone lesion.  IMPRESSION: 1. No acute findings.  No findings to account for abdominal pain. 2. No stomach mass. 3. Aortic atherosclerosis. Electronically Signed   By: DLajean ManesM.D.   On: 10/04/2021 14:59   DG Chest Port 1 View  Result Date: 10/02/2021 CLINICAL DATA:  Short of breath. Cough and congestion. COPD history EXAM: PORTABLE CHEST 1 VIEW COMPARISON:  06/19/2015 FINDINGS: Heart size upper normal. Negative for heart failure. Lungs are clear without infiltrate or effusion. Prominent lung markings appear chronic. IMPRESSION: No active disease. Electronically Signed   By: CFranchot GalloM.D.   On: 10/02/2021 12:35    Microbiology: Recent Results (from the past 240 hour(s))  Blood culture (routine x 2)     Status: None (Preliminary result)   Collection Time: 10/02/21  1:02 PM   Specimen: Left Antecubital; Blood  Result Value Ref Range Status   Specimen Description LEFT ANTECUBITAL  Final   Special Requests   Final    BOTTLES DRAWN AEROBIC AND ANAEROBIC Blood Culture adequate volume   Culture   Final    NO GROWTH 3 DAYS Performed at  [facility], [address] [place], [place] [address]    Report Status PENDING  Incomplete  Blood culture (routine x 2)     Status: None (Preliminary result)   Collection Time: 10/02/21  1:07 PM   Specimen: BLOOD RIGHT HAND  Result Value Ref Range Status   Specimen Description BLOOD RIGHT HAND  Final   Special Requests   Final    BOTTLES DRAWN AEROBIC AND ANAEROBIC Blood Culture adequate volume   Culture   Final    NO GROWTH 3 DAYS Performed at [facility], [address] [place], [place] [address]    Report Status PENDING  Incomplete  Resp Panel by RT-PCR (Flu A&B, Covid) Nasopharyngeal Swab     Status: None   Collection Time: 10/02/21  1:51 PM   Specimen: Nasopharyngeal Swab; Nasopharyngeal(NP) swabs in vial transport medium  Result Value Ref Range Status   SARS Coronavirus 2 by RT PCR NEGATIVE NEGATIVE Final    Comment: (NOTE) SARS-CoV-2 target nucleic  acids are NOT DETECTED.  The SARS-CoV-2 RNA is generally detectable in upper respiratory specimens during the acute phase of infection. The lowest concentration of SARS-CoV-2 viral copies this assay can detect is 138 copies/mL. A negative result does not preclude SARS-Cov-2 infection and should not be used as the sole basis for treatment or other patient management decisions. A negative result may occur with  improper specimen collection/handling, submission of specimen other than nasopharyngeal swab, presence of viral mutation(s) within the areas targeted by this assay, and inadequate number of viral copies(<138 copies/mL). A negative result must be combined with clinical observations, patient history, and epidemiological information. The expected result is Negative.  Fact Sheet for Patients:  EntrepreneurPulse.com.au  Fact Sheet for Healthcare Providers:  IncredibleEmployment.be  This test is no t yet approved or cleared by the [place] FDA and  has been authorized for detection and/or diagnosis of SARS-CoV-2 by FDA under an Emergency Use Authorization (EUA). This EUA will remain  in effect (meaning this test can be used) for the duration of the COVID-19 declaration under Section 564(b)(1) of the Act, 21 U.S.C.section 360bbb-3(b)(1), unless the authorization is terminated  or revoked sooner.       Influenza A by PCR NEGATIVE NEGATIVE Final   Influenza B by PCR NEGATIVE NEGATIVE Final    Comment: (NOTE) The Xpert Xpress SARS-CoV-2/FLU/RSV plus assay is intended as an aid in the diagnosis of influenza from Nasopharyngeal swab specimens and should not be used as a sole basis for treatment. Nasal washings and aspirates are unacceptable for Xpert Xpress SARS-CoV-2/FLU/RSV testing.  Fact Sheet for Patients: EntrepreneurPulse.com.au  Fact Sheet for Healthcare Providers: IncredibleEmployment.be  This  test is not yet approved or cleared by the [place] FDA and has been authorized for detection and/or diagnosis of SARS-CoV-2 by FDA under an Emergency Use Authorization (EUA). This EUA will remain in effect (meaning this test can be used) for the duration of the COVID-19 declaration under Section 564(b)(1) of the Act, 21 U.S.C. section 360bbb-3(b)(1), unless the authorization is terminated or revoked.  Performed at [facility], [address] [place], [place] [address]   MRSA Next Gen by PCR, Nasal     Status: None   Collection Time: 10/02/21  4:53 PM   Specimen: Nasal Mucosa; Nasal Swab  Result Value Ref Range Status   MRSA by PCR Next Gen NOT DETECTED NOT DETECTED Final    Comment: (NOTE) The GeneXpert MRSA Assay (FDA approved for NASAL specimens only), is one component  of a comprehensive MRSA colonization surveillance program. It is not intended to diagnose MRSA infection nor to guide or monitor treatment for MRSA infections. Test performance is not FDA approved in patients less than 20 years old. Performed at [facility], [address] [place], [place] [address]      Campbell: Basic Metabolic Panel: Recent Campbell  Lab 10/02/21 1239 10/03/21 0450 10/05/21 0549  NA 133* 136 137  K 3.5 3.9 4.2  CL 88* 92* 94*  CO2 37* 35* 35*  GLUCOSE 115* 186* 95  BUN _0 CREATININE 0.66 0.73 0.74  CALCIUM 8.2* 8.4* 9.0   Liver Function Tests: Recent Campbell  Lab 10/02/21 1324  AST 15  ALT 30  ALKPHOS 111  BILITOT 0.6  PROT 6.7  ALBUMIN 3.0*   Recent Campbell  Lab 10/02/21 1324  LIPASE 20   CBC: Recent Campbell  Lab 10/02/21 1239 10/03/21 0450 10/05/21 0549  WBC 23.1* 16.6* 12.3*  HGB 14.1 13.3 14.2  HCT 44.2 40.8 44.9  MCV 98.9 99.3 98.2  PLT 337 315 376   Signed:  Barton Dubois MD.  [facility] 10/05/2021, 3:31 PM

## 2021-10-05 NOTE — TOC Transition Note (Signed)
Transition of Care [facility]) - CM/SW Discharge Note   Patient Details  Name: Peter Campbell MRN: [id number] Date of Birth: [birth year]  Transition of Care [facility]) CM/SW Contact:  Hetty Ely, RN Phone Number: 10/05/2021, 4:51 PM    Final next level of care: Home/Self Care Barriers to Discharge: Barriers Resolved   Patient Goals and CMS Choice Patient states their goals for this hospitalization and ongoing recovery are:: Home with Oxygen      Discharge Placement                    Patient and family notified of of transfer: 10/05/21  Discharge Plan and Services                DME Arranged: Oxygen DME Agency: [facility]                  Social Determinants of Health (SDOH) Interventions     Readmission Risk Interventions No flowsheet data found.

## 2021-10-06 ENCOUNTER — Telehealth: Payer: Self-pay | Admitting: Cardiology

## 2021-10-06 NOTE — Telephone Encounter (Signed)
°  Pt c/o medication issue:  1. Name of Medication: norvasc  2. How are you currently taking this medication (dosage and times per day)?   3. Are you having a reaction (difficulty breathing--STAT)?   4. What is your medication issue? Pt is calling, he said he was in the hospital, and was sent home yesterday, he was told to stop taking his norvasc and he wanted to ask Dr. Diona Browner if that's ok to stop taking his norvasc

## 2021-10-06 NOTE — Telephone Encounter (Signed)
Hospitalized for COPD exacerbation. Dr.Madera stopped yesterday.   I will FYI Dr.McDowell

## 2021-10-07 LAB — CULTURE, BLOOD (ROUTINE X 2)
Culture: NO GROWTH
Culture: NO GROWTH
Special Requests: ADEQUATE
Special Requests: ADEQUATE

## 2021-10-14 ENCOUNTER — Encounter: Payer: Self-pay | Admitting: Orthopaedic Surgery

## 2021-10-14 ENCOUNTER — Ambulatory Visit (INDEPENDENT_AMBULATORY_CARE_PROVIDER_SITE_OTHER): Payer: Medicare Other | Admitting: Orthopaedic Surgery

## 2021-10-14 ENCOUNTER — Other Ambulatory Visit: Payer: Self-pay

## 2021-10-14 DIAGNOSIS — M25511 Pain in right shoulder: Secondary | ICD-10-CM

## 2021-10-14 DIAGNOSIS — G8929 Other chronic pain: Secondary | ICD-10-CM

## 2021-10-14 NOTE — Progress Notes (Signed)
PROCEDURE NOTE:  The patient request injection, verbal consent was obtained.  The right shoulder was prepped appropriately after time out was performed.   Sterile technique was observed and injection of 1 cc of DepoMedrol 40mg  with several cc's of plain xylocaine. Anesthesia was provided by ethyl chloride and a 20-gauge needle was used to inject the shoulder area. A posterior approach was used.  The injection was tolerated well.  A band aid dressing was applied.  The patient was advised to apply ice later today and tomorrow to the injection sight as needed.  Encounter Diagnosis  Name Primary?   Chronic right shoulder pain Yes   He has stopped smoking for five days.  I have encouraged him to continue to stop smoking.  Return in one month.  Call if any problem.  Precautions discussed.  Electronically Signed [name], MD 1/17/20231:46 PM

## 2021-10-28 ENCOUNTER — Encounter: Payer: Self-pay | Admitting: Internal Medicine

## 2021-11-11 ENCOUNTER — Encounter: Payer: Self-pay | Admitting: Orthopaedic Surgery

## 2021-11-11 ENCOUNTER — Other Ambulatory Visit: Payer: Self-pay

## 2021-11-11 ENCOUNTER — Ambulatory Visit (INDEPENDENT_AMBULATORY_CARE_PROVIDER_SITE_OTHER): Payer: Medicare Other | Admitting: Orthopaedic Surgery

## 2021-11-11 VITALS — Ht 70.0 in | Wt 177.0 lb

## 2021-11-11 DIAGNOSIS — M25511 Pain in right shoulder: Secondary | ICD-10-CM | POA: Diagnosis not present

## 2021-11-11 DIAGNOSIS — G8929 Other chronic pain: Secondary | ICD-10-CM

## 2021-11-11 NOTE — Progress Notes (Signed)
PROCEDURE NOTE:  The patient request injection, verbal consent was obtained.  The right shoulder was prepped appropriately after time out was performed.   Sterile technique was observed and injection of 1 cc of DepoMedrol 40mg  with several cc's of plain xylocaine. Anesthesia was provided by ethyl chloride and a 20-gauge needle was used to inject the shoulder area. A posterior approach was used.  The injection was tolerated well.  A band aid dressing was applied.  The patient was advised to apply ice later today and tomorrow to the injection sight as needed.  Encounter Diagnosis  Name Primary?   Chronic right shoulder pain Yes   Return in about three to four weeks.  Call if any problem.  Precautions discussed.  Electronically Signed , MD 2/14/20231:59 PM

## 2021-11-28 ENCOUNTER — Other Ambulatory Visit: Payer: Self-pay | Admitting: Cardiology

## 2021-12-02 ENCOUNTER — Other Ambulatory Visit: Payer: Self-pay

## 2021-12-02 ENCOUNTER — Encounter: Payer: Self-pay | Admitting: Orthopaedic Surgery

## 2021-12-02 ENCOUNTER — Ambulatory Visit (INDEPENDENT_AMBULATORY_CARE_PROVIDER_SITE_OTHER): Payer: Medicare Other | Admitting: Orthopaedic Surgery

## 2021-12-02 DIAGNOSIS — M25511 Pain in right shoulder: Secondary | ICD-10-CM | POA: Diagnosis not present

## 2021-12-02 DIAGNOSIS — G8929 Other chronic pain: Secondary | ICD-10-CM

## 2021-12-02 NOTE — Progress Notes (Signed)
PROCEDURE NOTE: ? ?The patient request injection, verbal consent was obtained. ? ?The right shoulder was prepped appropriately after time out was performed.  ? ?Sterile technique was observed and injection of 1 cc of DepoMedrol 40mg  with several cc's of plain xylocaine. Anesthesia was provided by ethyl chloride and a 20-gauge needle was used to inject the shoulder area. A posterior approach was used.  The injection was tolerated well. ? ?A band aid dressing was applied. ? ?The patient was advised to apply ice later today and tomorrow to the injection sight as needed. ? ?Encounter Diagnosis  ?Name Primary?  ? Chronic right shoulder pain Yes  ? ?Return in one month. ? ?Call if any problem. ? ?Precautions discussed. ? ?Electronically Signed ? , MD ?3/7/20231:46 PM ? ?

## 2021-12-23 ENCOUNTER — Other Ambulatory Visit: Payer: Self-pay | Admitting: Gastroenterology

## 2021-12-23 NOTE — Telephone Encounter (Signed)
Last ov 07/01/21

## 2021-12-30 ENCOUNTER — Other Ambulatory Visit: Payer: Self-pay | Admitting: Cardiology

## 2021-12-30 ENCOUNTER — Encounter: Payer: Self-pay | Admitting: Orthopaedic Surgery

## 2021-12-30 ENCOUNTER — Ambulatory Visit (INDEPENDENT_AMBULATORY_CARE_PROVIDER_SITE_OTHER): Payer: Medicare Other | Admitting: Orthopaedic Surgery

## 2021-12-30 DIAGNOSIS — M5410 Radiculopathy, site unspecified: Secondary | ICD-10-CM | POA: Diagnosis not present

## 2021-12-30 DIAGNOSIS — M25511 Pain in right shoulder: Secondary | ICD-10-CM | POA: Diagnosis not present

## 2021-12-30 DIAGNOSIS — M7022 Olecranon bursitis, left elbow: Secondary | ICD-10-CM | POA: Diagnosis not present

## 2021-12-30 DIAGNOSIS — G8929 Other chronic pain: Secondary | ICD-10-CM

## 2021-12-30 MED ORDER — PREDNISONE 5 MG (21) PO TBPK: ORAL_TABLET | ORAL | 0 refills | Status: DC

## 2021-12-30 NOTE — Progress Notes (Signed)
My back is hurting again. ? ?He has left sided sciatica again for about a week that is not getting better.  He is taking pain medicine.  I will give prednisone dose pack.   ? ?Spine/Pelvis examination: ? Inspection:  Overall, sacoiliac joint benign and hips nontender; without crepitus or defects. ? ? Thoracic spine inspection: Alignment normal without kyphosis present ? ? Lumbar spine inspection:  Alignment  with normal lumbar lordosis, without scoliosis apparent. ? ? Thoracic spine palpation:  without tenderness of spinal processes ? ? Lumbar spine palpation: without tenderness of lumbar area; without tightness of lumbar muscles  ? ? Range of Motion: ?  Lumbar flexion, forward flexion is normal without pain or tenderness  ?  Lumbar extension is full without pain or tenderness ?  Left lateral bend is normal without pain or tenderness ?  Right lateral bend is normal without pain or tenderness ?  Straight leg raising is normal ? Strength & tone: normal ? ? Stability overall normal stability ? ?PROCEDURE NOTE: ? ?The patient request injection, verbal consent was obtained. ? ?The right shoulder was prepped appropriately after time out was performed.  ? ?Sterile technique was observed and injection of 1 cc of DepoMedrol 40mg  with several cc's of plain xylocaine. Anesthesia was provided by ethyl chloride and a 20-gauge needle was used to inject the shoulder area. A posterior approach was used.  The injection was tolerated well. ? ?A band aid dressing was applied. ? ?The patient was advised to apply ice later today and tomorrow to the injection sight as needed. ? ?Procedure note: ?The left elbow olecranon bursa area was prepped after permission from the patient.  I was able to aspirate about 8 to 9 cc of straw colored fluid by sterile technique tolerated well from the bursa.   ? ?He may need surgery on the bursa of the olecranon on the left. ? ?Encounter Diagnoses  ?Name Primary?  ? Chronic right shoulder pain Yes  ?  Olecranon bursitis of left elbow   ? Back pain with left-sided radiculopathy   ? ?Return in one month. ? ?If back gets worse, call.  He may need MRI. ? ?Call if any problem. ? ?Precautions discussed. ? ?Electronically Signed ? , MD ?4/4/20231:44 PM ? ?

## 2022-01-01 ENCOUNTER — Ambulatory Visit: Payer: Medicare Other | Admitting: Orthopaedic Surgery

## 2022-01-06 ENCOUNTER — Ambulatory Visit (INDEPENDENT_AMBULATORY_CARE_PROVIDER_SITE_OTHER): Payer: Medicare Other | Admitting: Orthopaedic Surgery

## 2022-01-06 ENCOUNTER — Ambulatory Visit: Payer: Medicare Other

## 2022-01-06 ENCOUNTER — Encounter: Payer: Self-pay | Admitting: Orthopaedic Surgery

## 2022-01-06 VITALS — BP 175/94 | HR 88 | Ht 70.0 in | Wt 177.0 lb

## 2022-01-06 DIAGNOSIS — M5410 Radiculopathy, site unspecified: Secondary | ICD-10-CM | POA: Diagnosis not present

## 2022-01-06 NOTE — Progress Notes (Signed)
My back is worse. ? ?He has more lumbar spine and left sided sciatica.  He is post artifical disc and lumbar surgery by Dr. Saintclair Halsted in [place] in the past. ? ?He took the prednisone dose pack and it helped only a little.  He is on chronic pain management from another physician and that is not helping. ? ?He has left sided sciatica that will not go away.  He has no new trauma.  He has no weakness but just pain. ? ?Lumbar back has no spasm, limited ROM, left sided pain, NV intact, SLR positive on left at 25 degrees, muscle tone and strength normal. ? ?X-rays were done of the lumbar spine, reported separately. ? ?Encounter Diagnosis  ?Name Primary?  ? Back pain with left-sided radiculopathy Yes  ? ?I will get MRI of the lumbar spine. ? ?I will get appointment to see Dr. Saintclair Halsted again. ? ?Continue pain medicine. ? ?,Call if any problem. ? ?Precautions discussed. ? ?Electronically Signed ?[name], MD ?4/11/20239:52 AM ? ?

## 2022-01-13 ENCOUNTER — Telehealth: Payer: Self-pay

## 2022-01-13 NOTE — Telephone Encounter (Signed)
Patient called stating his MRI is scheduled for 4/26 and wanted to be sure you knew that he hasn't heard anything from Dr. Windy Carina office about an appointment. ?States he is really hurting. ?

## 2022-01-14 ENCOUNTER — Ambulatory Visit
Admission: RE | Admit: 2022-01-14 | Discharge: 2022-01-14 | Disposition: A | Discharge status: Home / Self Care | Payer: Medicare Other | Source: Ambulatory Visit | Attending: Orthopaedic Surgery | Admitting: Orthopaedic Surgery

## 2022-01-14 DIAGNOSIS — M5410 Radiculopathy, site unspecified: Secondary | ICD-10-CM | POA: Diagnosis present

## 2022-01-21 ENCOUNTER — Ambulatory Visit (HOSPITAL_COMMUNITY): Payer: Medicare Other

## 2022-01-27 ENCOUNTER — Encounter: Payer: Self-pay | Admitting: Orthopaedic Surgery

## 2022-01-27 ENCOUNTER — Ambulatory Visit (INDEPENDENT_AMBULATORY_CARE_PROVIDER_SITE_OTHER): Payer: Medicare Other | Admitting: Orthopaedic Surgery

## 2022-01-27 DIAGNOSIS — M25511 Pain in right shoulder: Secondary | ICD-10-CM | POA: Diagnosis not present

## 2022-01-27 DIAGNOSIS — M7022 Olecranon bursitis, left elbow: Secondary | ICD-10-CM

## 2022-01-27 DIAGNOSIS — F17210 Nicotine dependence, cigarettes, uncomplicated: Secondary | ICD-10-CM

## 2022-01-27 DIAGNOSIS — G8929 Other chronic pain: Secondary | ICD-10-CM

## 2022-01-27 DIAGNOSIS — M5410 Radiculopathy, site unspecified: Secondary | ICD-10-CM | POA: Diagnosis not present

## 2022-01-27 NOTE — Progress Notes (Signed)
PROCEDURE NOTE: ? ?The patient request injection, verbal consent was obtained. ? ?The right shoulder was prepped appropriately after time out was performed.  ? ?Sterile technique was observed and injection of 1 cc of DepoMedrol 40mg  with several cc's of plain xylocaine. Anesthesia was provided by ethyl chloride and a 20-gauge needle was used to inject the shoulder area. A posterior approach was used.  The injection was tolerated well. ? ?A band aid dressing was applied. ? ?The patient was advised to apply ice later today and tomorrow to the injection sight as needed. ? ?His left olecranon bursa is swollen.  I prepped and aspirated 12 cc blood tinged fluid by sterile technique well tolerated. ? ?Encounter Diagnoses  ?Name Primary?  ? Chronic right shoulder pain Yes  ? Olecranon bursitis of left elbow   ? Back pain with left-sided radiculopathy   ? Cigarette nicotine dependence without complication   ? ?Return in five weeks. ? ?He has appointment to see neurosurgeon about his back later this week.  I reviewed the MRI. ? ?Call if any problem. ? ?Precautions discussed. ? ?Electronically Signed ? , MD ?5/2/20231:46 PM ? ?

## 2022-01-30 ENCOUNTER — Encounter (HOSPITAL_COMMUNITY): Payer: Self-pay | Admitting: Emergency Medicine

## 2022-01-30 ENCOUNTER — Observation Stay (HOSPITAL_COMMUNITY)
Admission: EM | Admit: 2022-01-30 | Discharge: 2022-01-31 | Disposition: A | Discharge status: Home / Self Care | Payer: Medicare Other | Attending: Family Medicine | Admitting: Family Medicine

## 2022-01-30 ENCOUNTER — Emergency Department (HOSPITAL_COMMUNITY): Payer: Medicare Other

## 2022-01-30 DIAGNOSIS — M549 Dorsalgia, unspecified: Secondary | ICD-10-CM | POA: Insufficient documentation

## 2022-01-30 DIAGNOSIS — J449 Chronic obstructive pulmonary disease, unspecified: Secondary | ICD-10-CM

## 2022-01-30 DIAGNOSIS — Z96611 Presence of right artificial shoulder joint: Secondary | ICD-10-CM | POA: Diagnosis not present

## 2022-01-30 DIAGNOSIS — I160 Hypertensive urgency: Principal | ICD-10-CM | POA: Diagnosis present

## 2022-01-30 DIAGNOSIS — D72829 Elevated white blood cell count, unspecified: Secondary | ICD-10-CM

## 2022-01-30 DIAGNOSIS — Z87891 Personal history of nicotine dependence: Secondary | ICD-10-CM | POA: Insufficient documentation

## 2022-01-30 DIAGNOSIS — K219 Gastro-esophageal reflux disease without esophagitis: Secondary | ICD-10-CM

## 2022-01-30 DIAGNOSIS — I2510 Atherosclerotic heart disease of native coronary artery without angina pectoris: Secondary | ICD-10-CM | POA: Diagnosis present

## 2022-01-30 DIAGNOSIS — G8929 Other chronic pain: Secondary | ICD-10-CM

## 2022-01-30 DIAGNOSIS — Z7982 Long term (current) use of aspirin: Secondary | ICD-10-CM | POA: Insufficient documentation

## 2022-01-30 DIAGNOSIS — M5440 Lumbago with sciatica, unspecified side: Secondary | ICD-10-CM

## 2022-01-30 DIAGNOSIS — R072 Precordial pain: Secondary | ICD-10-CM | POA: Diagnosis present

## 2022-01-30 DIAGNOSIS — Z79899 Other long term (current) drug therapy: Secondary | ICD-10-CM | POA: Diagnosis not present

## 2022-01-30 DIAGNOSIS — I10 Essential (primary) hypertension: Secondary | ICD-10-CM

## 2022-01-30 DIAGNOSIS — R911 Solitary pulmonary nodule: Secondary | ICD-10-CM

## 2022-01-30 DIAGNOSIS — Z720 Tobacco use: Secondary | ICD-10-CM | POA: Diagnosis present

## 2022-01-30 DIAGNOSIS — G4700 Insomnia, unspecified: Secondary | ICD-10-CM | POA: Diagnosis not present

## 2022-01-30 LAB — CBC
HCT: 49.4 % (ref 39.0–52.0)
Hemoglobin: 16.3 g/dL (ref 13.0–17.0)
MCH: 31.2 pg (ref 26.0–34.0)
MCHC: 33 g/dL (ref 30.0–36.0)
MCV: 94.5 fL (ref 80.0–100.0)
Platelets: 239 10*3/uL (ref 150–400)
RBC: 5.23 MIL/uL (ref 4.22–5.81)
RDW: 13.5 % (ref 11.5–15.5)
WBC: 12.5 10*3/uL — ABNORMAL HIGH (ref 4.0–10.5)
nRBC: 0 % (ref 0.0–0.2)

## 2022-01-30 LAB — BASIC METABOLIC PANEL
Anion gap: 6 (ref 5–15)
BUN: 20 mg/dL (ref 8–23)
CO2: 29 mmol/L (ref 22–32)
Calcium: 9.2 mg/dL (ref 8.9–10.3)
Chloride: 104 mmol/L (ref 98–111)
Creatinine, Ser: 0.64 mg/dL (ref 0.61–1.24)
GFR, Estimated: >60 mL/min (ref >60)
Glucose, Bld: 107 mg/dL — ABNORMAL HIGH (ref 70–99)
Potassium: 3.8 mmol/L (ref 3.5–5.1)
Sodium: 139 mmol/L (ref 135–145)

## 2022-01-30 LAB — TROPONIN I (HIGH SENSITIVITY)
Troponin I (High Sensitivity): 4 ng/L (ref <18)
Troponin I (High Sensitivity): 4 ng/L (ref <18)

## 2022-01-30 MED ORDER — NITROGLYCERIN 0.4 MG SL SUBL
0.4000 mg | SUBLINGUAL_TABLET | SUBLINGUAL | Status: DC | PRN
  Administered 2022-01-31 (×2): 0.4 mg via SUBLINGUAL

## 2022-01-30 MED ORDER — OXYCODONE-ACETAMINOPHEN 7.5-325 MG PO TABS
1.0000 | ORAL_TABLET | Freq: Four times a day (QID) | ORAL | Status: DC | PRN
  Administered 2022-01-31 (×3): 1 via ORAL
  Filled 2022-01-30 (×3): qty 1

## 2022-01-30 MED ORDER — ASPIRIN 81 MG PO CHEW
324.0000 mg | CHEWABLE_TABLET | Freq: Once | ORAL | Status: AC
  Administered 2022-01-30: 324 mg via ORAL
  Filled 2022-01-30: qty 4

## 2022-01-30 MED ORDER — MORPHINE SULFATE (PF) 4 MG/ML IV SOLN
4.0000 mg | Freq: Once | INTRAVENOUS | Status: AC
  Administered 2022-01-30: 4 mg via INTRAVENOUS
  Filled 2022-01-30: qty 1

## 2022-01-30 MED ORDER — METOPROLOL SUCCINATE ER 25 MG PO TB24: 25.0000 mg | ORAL_TABLET | Freq: Every day | ORAL | Status: DC

## 2022-01-30 MED ORDER — ENOXAPARIN SODIUM 40 MG/0.4ML IJ SOSY
40.0000 mg | PREFILLED_SYRINGE | INTRAMUSCULAR | Status: DC
  Administered 2022-01-31: 40 mg via SUBCUTANEOUS
  Filled 2022-01-30: qty 0.4

## 2022-01-30 MED ORDER — ALBUTEROL SULFATE (2.5 MG/3ML) 0.083% IN NEBU
INHALATION_SOLUTION | RESPIRATORY_TRACT | Status: AC
Start: 2022-01-30 — End: 2022-01-30
  Administered 2022-01-30: 2.5 mg
  Filled 2022-01-30: qty 3

## 2022-01-30 MED ORDER — IPRATROPIUM-ALBUTEROL 0.5-2.5 (3) MG/3ML IN SOLN: 3.0000 mL | Freq: Four times a day (QID) | RESPIRATORY_TRACT | Status: DC | PRN

## 2022-01-30 MED ORDER — ASPIRIN EC 81 MG PO TBEC
81.0000 mg | DELAYED_RELEASE_TABLET | Freq: Every day | ORAL | Status: DC
  Administered 2022-01-31: 81 mg via ORAL
  Filled 2022-01-30: qty 1

## 2022-01-30 MED ORDER — NITROGLYCERIN 0.4 MG SL SUBL
0.4000 mg | SUBLINGUAL_TABLET | Freq: Once | SUBLINGUAL | Status: AC
Start: 2022-01-30 — End: 2022-01-30
  Administered 2022-01-30: 0.4 mg via SUBLINGUAL
  Filled 2022-01-30: qty 1

## 2022-01-30 MED ORDER — PANTOPRAZOLE SODIUM 40 MG PO TBEC
40.0000 mg | DELAYED_RELEASE_TABLET | Freq: Every day | ORAL | Status: DC
  Administered 2022-01-31: 40 mg via ORAL
  Filled 2022-01-30: qty 1

## 2022-01-30 MED ORDER — NICOTINE 21 MG/24HR TD PT24
21.0000 mg | MEDICATED_PATCH | TRANSDERMAL | Status: DC
  Administered 2022-01-31: 21 mg via TRANSDERMAL
  Filled 2022-01-30: qty 1

## 2022-01-30 MED ORDER — ALBUTEROL SULFATE (2.5 MG/3ML) 0.083% IN NEBU: 3.0000 mL | INHALATION_SOLUTION | Freq: Four times a day (QID) | RESPIRATORY_TRACT | Status: DC | PRN

## 2022-01-30 MED ORDER — IPRATROPIUM-ALBUTEROL 0.5-2.5 (3) MG/3ML IN SOLN
3.0000 mL | Freq: Once | RESPIRATORY_TRACT | Status: AC
  Administered 2022-01-30: 3 mL via RESPIRATORY_TRACT
  Filled 2022-01-30: qty 3

## 2022-01-30 MED ORDER — METOPROLOL SUCCINATE ER 50 MG PO TB24
50.0000 mg | ORAL_TABLET | Freq: Every day | ORAL | Status: DC
  Administered 2022-01-31: 50 mg via ORAL
  Filled 2022-01-30: qty 1

## 2022-01-30 NOTE — ED Triage Notes (Signed)
Pt arrives [facility] from home c/o chest pain that started yesterday but got worse again tonight after eating dinner. Pt given 324 ASA PTA.  ? ?Pt also complains of increased back pain, which is is scheduled to follow up with ortho about surgery. ?

## 2022-01-30 NOTE — ED Provider Notes (Signed)
?[facility] ?Provider Note ? ? ?CSN: [id number] ?Arrival date & time: 01/30/22  2026 ? ?  ? ?History ? ?Chief Complaint  ?Patient presents with  ? Chest Pain  ? ? ?Peter Campbell is a 66 y.o. male. ? ? ?Chest Pain ? ?This patient is a 66 year old male, he has a history of chronic tobacco use and still smokes about a pack and a half of cigarettes per day.  He also is known to have hypertension on metoprolol, he takes Protonix for acid reflux and does keep nitroglycerin at home due to a history of having a heart attack.  This was diagnosed clinically and he states he never had a heart catheterization.  The medical record reflects that the patient did have a nuclear medicine stress test that was performed in October 2018 and showed a normal ejection fraction with no ST segment deviation, this was considered to have no ischemia. ? ?The patient follows with Dr. Domenic Polite ? ?The patient reports that he is supposed to have back surgery coming up, he had an appointment yesterday which she had to miss because he was having some intermittent chest pains with feeling sweaty and tingly, today while he was laying down after dinner he developed acute onset of a pressure or heaviness in his chest on the left side with associated feeling of diaphoresis, the pain does not radiate, it is something that he has never had until yesterday when he had the same pain.  He states he does not do much exertion because of his back pain and sciatica and so he is unaware if he has had any exertional symptoms.  He did take some medications tonight including an aspirin which did not really help, took a nitroglycerin with no significant relief and continues to have a pressure in the left side of his chest.  He is not coughing, he is not having diarrhea in fact he is chronically constipated.  He denies any swelling of his legs ? ?He does report that he checked his blood pressure at home tonight and it was around 190/105, he did not  feel comfortable going to the doctor yesterday because of the hypertension and when it came on again tonight with the symptoms he decided to come to the ER.  He did take his metoprolol before arriving. ? ?Home Medications ?Prior to Admission medications   ?Medication Sig Start Date End Date Taking? Authorizing Provider  ?albuterol (PROVENTIL HFA;VENTOLIN HFA) 108 (90 Base) MCG/ACT inhaler Inhale 1-2 puffs into the lungs every 6 (six) hours as needed for wheezing or shortness of breath.   Yes [provider]  ?aspirin EC 81 MG tablet Take 1 tablet (81 mg total) by mouth daily. 10/31/13  Yes Satira Sark, MD  ?budesonide-formoterol [facility]) 160-4.5 MCG/ACT inhaler Inhale 2 puffs into the lungs 2 (two) times daily. 10/05/21  Yes Barton Dubois, MD  ?Diclofenac Sodium CR 100 MG 24 hr tablet Take 100 mg by mouth daily. 09/23/21  Yes [provider]  ?gabapentin (NEURONTIN) 600 MG tablet Take 600 mg by mouth in the morning, at noon, in the evening, and at bedtime. 01/26/18  Yes [provider]  ?guaiFENesin-dextromethorphan (ROBITUSSIN DM) 100-10 MG/5ML syrup Take 5 mLs by mouth every 6 (six) hours as needed for cough. 10/05/21  Yes Barton Dubois, MD  ?ipratropium-albuterol (DUONEB) 0.5-2.5 (3) MG/3ML SOLN Take 3 mLs by nebulization every 6 (six) hours as needed. ?Patient taking differently: Take 3 mLs by nebulization every 6 (six) hours  as needed (shortness of breath). 06/19/15  Yes Veryl Speak, MD  ?[name] 290 MCG CAPS capsule TAKE 1 TABLET BY MOUTH ONCE DAILY BEFORE BREAKFAST. 12/23/21  Yes Rourk, Cristopher Estimable, MD  ?metoprolol succinate (TOPROL-XL) 25 MG 24 hr tablet TAKE 2 TABLETS BY MOUTH IN THE MORNING AND 1 TABLET BY MOUTH IN THE EVENING. 12/30/21  Yes Satira Sark, MD  ?nitroGLYCERIN (NITROSTAT) 0.4 MG SL tablet Place 1 tablet (0.4 mg total) under the tongue every 5 (five) minutesas needed for chest pain. 03/07/20  Yes Satira Sark, MD  ?oxyCODONE-acetaminophen (PERCOCET) 7.5-325  MG tablet Take 1 tablet by mouth 4 (four) times daily as needed. 02/16/20  Yes [provider]  ?pantoprazole (PROTONIX) 40 MG tablet Take 1 tablet (40 mg total) by mouth daily. 07/01/21 06/26/22 Yes Rourk, Cristopher Estimable, MD  ?saccharomyces boulardii (FLORASTOR) 250 MG capsule Take 1 capsule (250 mg total) by mouth 2 (two) times daily. 10/05/21  Yes Barton Dubois, MD  ?temazepam (RESTORIL) 30 MG capsule Take 60 mg by mouth at bedtime.   Yes [provider]  ?XTAMPZA ER 13.5 MG C12A Take 1 capsule by mouth 2 (two) times daily. 04/03/20  Yes [provider]  ?naloxone (NARCAN) 4 MG/0.1ML LIQD nasal spray kit Place 1 spray into the nose as needed (as directed).    [provider]  ?nicotine (NICODERM CQ - DOSED IN MG/24 HOURS) 21 mg/24hr patch Place 1 patch (21 mg total) onto the skin daily. 10/05/21 10/05/22  Barton Dubois, MD  ?predniSONE (STERAPRED UNI-PAK 21 TAB) 5 MG (21) TBPK tablet Take 6 pills first day; 5 pills second day; 4 pills third day; 3 pills fourth day; 2 pills next day and 1 pill last day. 12/30/21   [name], MD  ?   ? ?Allergies    ?Fentanyl   ? ?Review of Systems   ?Review of Systems  ?Cardiovascular:  Positive for chest pain.  ?All other systems reviewed and are negative. ? ?Physical Exam ?Updated Vital Signs ?BP 135/70   Pulse 66   Temp 97.9 ?F (36.6 ?C) (Oral)   Resp (!) 21   Ht 1.778 m ($Remove'5\' 10"'VhLoCqT$ )   Wt 80.3 kg   SpO2 95%   BMI 25.40 kg/m?  ?Physical Exam ?Vitals and nursing note reviewed.  ?Constitutional:   ?   General: He is not in acute distress. ?   Appearance: He is well-developed.  ?HENT:  ?   Head: Normocephalic and atraumatic.  ?   Mouth/Throat:  ?   Pharynx: No oropharyngeal exudate.  ?Eyes:  ?   General: No scleral icterus.    ?   Right eye: No discharge.     ?   Left eye: No discharge.  ?   Conjunctiva/sclera: Conjunctivae normal.  ?   Pupils: Pupils are equal, round, and reactive to light.  ?Neck:  ?   Thyroid: No thyromegaly.  ?   Vascular: No JVD.   ?Cardiovascular:  ?   Rate and Rhythm: Normal rate and regular rhythm.  ?   Heart sounds: Normal heart sounds. No murmur heard. ?  No friction rub. No gallop.  ?Pulmonary:  ?   Effort: Pulmonary effort is normal. No respiratory distress.  ?   Breath sounds: Wheezing present. No rales.  ?Abdominal:  ?   General: Bowel sounds are normal. There is no distension.  ?   Palpations: Abdomen is soft. There is no mass.  ?   Tenderness: There is no abdominal tenderness.  ?  Musculoskeletal:     ?   General: No tenderness. Normal range of motion.  ?   Cervical back: Normal range of motion and neck supple.  ?Lymphadenopathy:  ?   Cervical: No cervical adenopathy.  ?Skin: ?   General: Skin is warm and dry.  ?   Findings: No erythema or rash.  ?Neurological:  ?   Mental Status: He is alert.  ?   Coordination: Coordination normal.  ?Psychiatric:     ?   Behavior: Behavior normal.  ? ? ?ED Results / Procedures / Treatments   ?Labs ?(all labs ordered are listed, but only abnormal results are displayed) ?Labs Reviewed  ?BASIC METABOLIC PANEL - Abnormal; Notable for the following components:  ?    Result Value  ? Glucose, Bld 107 (*)   ? All other components within normal limits  ?CBC - Abnormal; Notable for the following components:  ? WBC 12.5 (*)   ? All other components within normal limits  ?TROPONIN I (HIGH SENSITIVITY)  ?TROPONIN I (HIGH SENSITIVITY)  ? ? ?EKG ?EKG Interpretation ? ?Date/Time:  Friday Jan 30 2022 20:32:49 EDT ?Ventricular Rate:  68 ?PR Interval:  167 ?QRS Duration: 89 ?QT Interval:  385 ?QTC Calculation: 410 ?R Axis:   88 ?Text Interpretation: Sinus rhythm Borderline right axis deviation Since last tracing rate faster Confirmed by Noemi Chapel [phone]) on 01/30/2022 8:54:20 PM ? ?Radiology ?DG Chest 2 View ? ?Result Date: 01/30/2022 ?CLINICAL DATA:  Chest pain EXAM: CHEST - 2 VIEW COMPARISON:  Chest x-ray 10/02/2021. chest CT 10/02/2021. FINDINGS: The heart size and mediastinal contours are within normal limits.  There is no pleural effusion or pneumothorax. There are minimal lingular opacities, unchanged from prior. Small nodular density projects over the left lower lung, not definitely seen on prior brain minimal right up

## 2022-01-30 NOTE — ED Notes (Addendum)
Attempted to call report x1; dispo set to 45 minutes. RN stated "they are not ready" and need to review the chart. Will call back. ?

## 2022-01-30 NOTE — H&P (Signed)
?History and Physical  ? ? ?Patient: Peter Campbell PFX:[id number] DOB: 02-Dec-1955 ?DOA: 01/30/2022 ?DOS: the patient was seen and examined on 01/31/2022 ?PCP: Abran Richard, MD  ?Patient coming from: Home ? ?Chief Complaint:  ?Chief Complaint  ?Patient presents with  ? Chest Pain  ? ?HPI: Peter Campbell is a 66 y.o. male with medical history significant of hypertension, GERD, CAD, chronic tobacco use (1.5 PPD) who presents to the emergency department via EMS due to 1 day onset of reported chest pain.  At bedside, patient denies any form of chest pain, he states that he has had chest pain before which was pressure-like, but he denies any chest pain at this time.  However, patient endorsed a sudden onset of increased BP which started yesterday, this was associated with sweating, on checking his blood pressure it was about 190/105, patient was unsure if the sudden increase in BP was related to his history of back pain with a pinched nerve and pain radiation to left knee.  Patient states that symptoms eventually resolved without any measures.  After dinner tonight, he had a recurrence increase BP, with sweating and minimal nonradiating left-sided chest discomfort (which was in comparable to the type of chest pain he had when he had a heart attack).  BP was checked and was 190/110 (per patient), he took home BP meds and activated  EMS.  On arrival of EMS team, aspirin 325 mg was given by the rescue team without any improvement.  He was then taken to the ED for further evaluation and management. ? ?ED Course:  ?In the emergency department, BP was 193/54 and it was initially tachypneic, other vital signs were within normal range.  Work-up in the ED showed normal CBC except for leukocytosis, normal BMP, troponin x1 was 4.   ?Chest x-ray showed no acute cardiopulmonary disease ?DuoNeb was given, nitroglycerin sublingual x1 was given without any improvement.  IV morphine 4 mg x 1 was given.  Hospitalist was asked to admit patient  for further evaluation and management. ? ?Review of Systems: ?Review of systems as noted in the HPI. All other systems reviewed and are negative. ? ? ?Past Medical History:  ?Diagnosis Date  ? Arthritis   ? Chronic back pain   ? Collagen vascular disease ([place])   ? Colonic mass   ? History, s/p removal; per patient.  ? Constipation   ? COPD (chronic obstructive pulmonary disease) ([employer])   ? Coronary atherosclerosis of native coronary artery   ? Dense coronary atherosclerosis by chest CT November 2014   ? Dyspnea   ? when walking distance  ? Essential hypertension   ? Essential tremor   ? History of chicken pox   ? History of [place] measles   ? Pneumonia   ? 2015 ish  ? Sciatica   ? ?Past Surgical History:  ?Procedure Laterality Date  ? CIRCUMCISION    ? COLON SURGERY  '80's  ? for mass removal, colonostomy  ? COLONOSCOPY WITH PROPOFOL N/A 05/03/2017  ? Two 3-5 mm polyps in rectum and descending colon s/p removal, pancolonic diverticulosis, internal hemorrhoids. Benign. Surveillance due in 5 years.   ? colonostomy reversal  80's  ? LUMBAR FUSION  11/27/2019  ? Lumber discectomy    ? Middle finger Right   ? fracture  ? NASAL SEPTUM SURGERY    ? POLYPECTOMY  05/03/2017  ? Procedure: POLYPECTOMY;  Surgeon: Daneil Dolin, MD;  Location: [facility];  Service: Endoscopy;;  descending  colon and rectal  ? SHOULDER ACROMIOPLASTY Right 06/12/2014  ? Procedure: RIGHT SHOULDER ACROMIOPLASTY;  Surgeon: [name], MD;  Location: [facility] ORS;  Service: Orthopedics;  Laterality: Right;  ? SHOULDER OPEN ROTATOR CUFF REPAIR Right 06/12/2014  ? Procedure: OPEN REPAIR ROTATOR CUFF RIGHT ;  Surgeon: [name], MD;  Location: [facility] ORS;  Service: Orthopedics;  Laterality: Right;  ? TONSILLECTOMY    ? TYMPANOSTOMY TUBE PLACEMENT    ? ? ?Social History:  reports that he has quit smoking. His smoking use included cigarettes. He started smoking about 52 years ago. He has a 30.00 pack-year smoking history. He has never used smokeless tobacco. He  reports current drug use. Drugs: Marijuana and Other-see comments. He reports that he does not drink alcohol. ? ? ?Allergies  ?Allergen Reactions  ? Fentanyl Other (See Comments)  ?  Ineffective ?  ? ? ?Family History  ?Problem Relation Age of Onset  ? COPD Father   ? Cancer Father   ? Cancer Mother   ? Heart attack Brother   ? Epilepsy Sister   ? Hypertension Sister   ? Colon cancer Neg Hx   ?  ? ?Prior to Admission medications   ?Medication Sig Start Date End Date Taking? Authorizing Provider  ?albuterol (PROVENTIL HFA;VENTOLIN HFA) 108 (90 Base) MCG/ACT inhaler Inhale 1-2 puffs into the lungs every 6 (six) hours as needed for wheezing or shortness of breath.   Yes [provider]  ?aspirin EC 81 MG tablet Take 1 tablet (81 mg total) by mouth daily. 10/31/13  Yes Satira Sark, MD  ?Diclofenac Sodium CR 100 MG 24 hr tablet Take 100 mg by mouth daily. 09/23/21  Yes [provider]  ?gabapentin (NEURONTIN) 600 MG tablet Take 600 mg by mouth in the morning, at noon, in the evening, and at bedtime. 01/26/18  Yes [provider]  ?ipratropium-albuterol (DUONEB) 0.5-2.5 (3) MG/3ML SOLN Take 3 mLs by nebulization every 6 (six) hours as needed. ?Patient taking differently: Take 3 mLs by nebulization every 6 (six) hours as needed (shortness of breath). 06/19/15  Yes Veryl Speak, MD  ?[name] 290 MCG CAPS capsule TAKE 1 TABLET BY MOUTH ONCE DAILY BEFORE BREAKFAST. ?Patient taking differently: Take 290 mcg by mouth daily before breakfast. 12/23/21  Yes Rourk, Cristopher Estimable, MD  ?metoprolol succinate (TOPROL-XL) 25 MG 24 hr tablet TAKE 2 TABLETS BY MOUTH IN THE MORNING AND 1 TABLET BY MOUTH IN THE EVENING. ?Patient taking differently: 25-50 mg See admin instructions. Take two tablets by mouth in the morning and then take 1 tablet by mouth in the evening per patient 12/30/21  Yes Satira Sark, MD  ?naloxone [facility]) 4 MG/0.1ML LIQD nasal spray kit Place 1 spray into the nose as needed (as directed).    Yes [provider]  ?nitroGLYCERIN (NITROSTAT) 0.4 MG SL tablet Place 1 tablet (0.4 mg total) under the tongue every 5 (five) minutesas needed for chest pain. 03/07/20  Yes Satira Sark, MD  ?oxyCODONE-acetaminophen (PERCOCET) 7.5-325 MG tablet Take 1 tablet by mouth 4 (four) times daily as needed for moderate pain. 02/16/20  Yes [provider]  ?pantoprazole (PROTONIX) 40 MG tablet Take 1 tablet (40 mg total) by mouth daily. 07/01/21 06/26/22 Yes Rourk, Cristopher Estimable, MD  ?temazepam (RESTORIL) 30 MG capsule Take 60 mg by mouth at bedtime.   Yes [provider]  ?XTAMPZA ER 13.5 MG C12A Take 1 capsule by mouth 2 (two) times daily. 04/03/20  Yes [provider]  ?  budesonide-formoterol (SYMBICORT) 160-4.5 MCG/ACT inhaler Inhale 2 puffs into the lungs 2 (two) times daily. ?Patient not taking: Reported on 01/30/2022 10/05/21   Barton Dubois, MD  ?guaiFENesin-dextromethorphan [facility] DM) 100-10 MG/5ML syrup Take 5 mLs by mouth every 6 (six) hours as needed for cough. ?Patient not taking: Reported on 01/30/2022 10/05/21   Barton Dubois, MD  ?nicotine (NICODERM CQ - DOSED IN MG/24 HOURS) 21 mg/24hr patch Place 1 patch (21 mg total) onto the skin daily. ?Patient not taking: Reported on 01/30/2022 10/05/21 10/05/22  Barton Dubois, MD  ?predniSONE (STERAPRED UNI-PAK 21 TAB) 5 MG (21) TBPK tablet Take 6 pills first day; 5 pills second day; 4 pills third day; 3 pills fourth day; 2 pills next day and 1 pill last day. ?Patient not taking: Reported on 01/30/2022 12/30/21   [name], MD  ?saccharomyces boulardii (FLORASTOR) 250 MG capsule Take 1 capsule (250 mg total) by mouth 2 (two) times daily. ?Patient not taking: Reported on 01/30/2022 10/05/21   Barton Dubois, MD  ? ? ?Physical Exam: ?BP (!) 118/96 (BP Location: Right Arm)   Pulse 73   Temp 98.8 ?F (37.1 ?C) (Oral)   Resp 14   Ht 5' 10"  (1.778 m)   Wt 76.3 kg   SpO2 93%   BMI 24.14 kg/m?  ? ?General: 66 y.o. year-old male well developed well  nourished in no acute distress.  Alert and oriented x3. ?HEENT: NCAT, EOMI ?Neck: Supple, trachea medial ?Cardiovascular: Regular rate and rhythm with no rubs or gallops.  No thyromegaly or JVD noted.  No lowe

## 2022-01-31 ENCOUNTER — Other Ambulatory Visit: Payer: Self-pay

## 2022-01-31 ENCOUNTER — Observation Stay (HOSPITAL_BASED_OUTPATIENT_CLINIC_OR_DEPARTMENT_OTHER): Payer: Medicare Other

## 2022-01-31 DIAGNOSIS — R079 Chest pain, unspecified: Secondary | ICD-10-CM | POA: Diagnosis not present

## 2022-01-31 DIAGNOSIS — R072 Precordial pain: Secondary | ICD-10-CM | POA: Diagnosis not present

## 2022-01-31 DIAGNOSIS — I160 Hypertensive urgency: Secondary | ICD-10-CM | POA: Diagnosis not present

## 2022-01-31 LAB — APTT: aPTT: 32 seconds (ref 24–36)

## 2022-01-31 LAB — COMPREHENSIVE METABOLIC PANEL
ALT: 12 U/L (ref 0–44)
AST: 11 U/L — ABNORMAL LOW (ref 15–41)
Albumin: 3.9 g/dL (ref 3.5–5.0)
Alkaline Phosphatase: 82 U/L (ref 38–126)
Anion gap: 7 (ref 5–15)
BUN: 21 mg/dL (ref 8–23)
CO2: 28 mmol/L (ref 22–32)
Calcium: 9.1 mg/dL (ref 8.9–10.3)
Chloride: 103 mmol/L (ref 98–111)
Creatinine, Ser: 0.59 mg/dL — ABNORMAL LOW (ref 0.61–1.24)
GFR, Estimated: >60 mL/min (ref >60)
Glucose, Bld: 96 mg/dL (ref 70–99)
Potassium: 3.4 mmol/L — ABNORMAL LOW (ref 3.5–5.1)
Sodium: 138 mmol/L (ref 135–145)
Total Bilirubin: 1 mg/dL (ref 0.3–1.2)
Total Protein: 6.5 g/dL (ref 6.5–8.1)

## 2022-01-31 LAB — CBC
HCT: 48.9 % (ref 39.0–52.0)
Hemoglobin: 15.8 g/dL (ref 13.0–17.0)
MCH: 30.6 pg (ref 26.0–34.0)
MCHC: 32.3 g/dL (ref 30.0–36.0)
MCV: 94.6 fL (ref 80.0–100.0)
Platelets: 227 10*3/uL (ref 150–400)
RBC: 5.17 MIL/uL (ref 4.22–5.81)
RDW: 13.6 % (ref 11.5–15.5)
WBC: 9.5 10*3/uL (ref 4.0–10.5)
nRBC: 0 % (ref 0.0–0.2)

## 2022-01-31 LAB — MAGNESIUM: Magnesium: 2 mg/dL (ref 1.7–2.4)

## 2022-01-31 LAB — ECHOCARDIOGRAM COMPLETE
Area-P 1/2: 2.51 cm2
Height: 70 in
S' Lateral: 3.4 cm
Weight: 2691.38 oz

## 2022-01-31 LAB — PHOSPHORUS: Phosphorus: 3.5 mg/dL (ref 2.5–4.6)

## 2022-01-31 MED ORDER — ENSURE ENLIVE PO LIQD
237.0000 mL | Freq: Two times a day (BID) | ORAL | Status: DC
  Administered 2022-01-31: 237 mL via ORAL

## 2022-01-31 MED ORDER — OXYCODONE HCL ER 15 MG PO T12A
15.0000 mg | EXTENDED_RELEASE_TABLET | Freq: Once | ORAL | Status: AC
  Administered 2022-01-31: 15 mg via ORAL
  Filled 2022-01-31: qty 1

## 2022-01-31 MED ORDER — POTASSIUM CHLORIDE CRYS ER 20 MEQ PO TBCR
40.0000 meq | EXTENDED_RELEASE_TABLET | ORAL | Status: AC
  Administered 2022-01-31 (×2): 40 meq via ORAL
  Filled 2022-01-31 (×2): qty 2

## 2022-01-31 MED ORDER — ISOSORBIDE MONONITRATE ER 30 MG PO TB24: 30.0000 mg | ORAL_TABLET | Freq: Every day | ORAL | 11 refills | Status: DC

## 2022-01-31 MED ORDER — TEMAZEPAM 15 MG PO CAPS
15.0000 mg | ORAL_CAPSULE | Freq: Once | ORAL | Status: AC
Start: 2022-01-31 — End: 2022-01-31
  Administered 2022-01-31: 15 mg via ORAL
  Filled 2022-01-31: qty 1

## 2022-01-31 MED ORDER — LABETALOL HCL 5 MG/ML IV SOLN
20.0000 mg | Freq: Once | INTRAVENOUS | Status: AC
  Administered 2022-01-31: 20 mg via INTRAVENOUS
  Filled 2022-01-31: qty 4

## 2022-01-31 MED ORDER — ASPIRIN EC 81 MG PO TBEC: 81.0000 mg | DELAYED_RELEASE_TABLET | Freq: Every day | ORAL | 2 refills | Status: AC

## 2022-01-31 MED ORDER — AMLODIPINE BESYLATE 10 MG PO TABS: 10.0000 mg | ORAL_TABLET | Freq: Every day | ORAL | 3 refills | Status: DC

## 2022-01-31 MED ORDER — TEMAZEPAM 15 MG PO CAPS: 15.0000 mg | ORAL_CAPSULE | Freq: Every day | ORAL | Status: DC

## 2022-01-31 MED ORDER — AMLODIPINE BESYLATE 5 MG PO TABS
10.0000 mg | ORAL_TABLET | Freq: Every day | ORAL | Status: DC
  Administered 2022-01-31: 10 mg via ORAL
  Filled 2022-01-31: qty 2

## 2022-01-31 MED ORDER — TEMAZEPAM 30 MG PO CAPS: 30.0000 mg | ORAL_CAPSULE | Freq: Every day | ORAL | 0 refills | Status: DC

## 2022-01-31 MED ORDER — BUDESONIDE-FORMOTEROL FUMARATE 160-4.5 MCG/ACT IN AERO: 2.0000 | INHALATION_SPRAY | Freq: Two times a day (BID) | RESPIRATORY_TRACT | 3 refills | Status: DC

## 2022-01-31 MED ORDER — TEMAZEPAM 15 MG PO CAPS
15.0000 mg | ORAL_CAPSULE | Freq: Once | ORAL | Status: AC
  Administered 2022-01-31: 15 mg via ORAL
  Filled 2022-01-31: qty 1

## 2022-01-31 MED ORDER — GABAPENTIN 600 MG PO TABS: 600.0000 mg | ORAL_TABLET | Freq: Three times a day (TID) | ORAL | 3 refills | Status: DC

## 2022-01-31 MED ORDER — ALBUTEROL SULFATE HFA 108 (90 BASE) MCG/ACT IN AERS: 2.0000 | INHALATION_SPRAY | RESPIRATORY_TRACT | 1 refills | Status: DC | PRN

## 2022-01-31 MED ORDER — IPRATROPIUM-ALBUTEROL 0.5-2.5 (3) MG/3ML IN SOLN: 3.0000 mL | Freq: Four times a day (QID) | RESPIRATORY_TRACT | 2 refills | Status: AC | PRN

## 2022-01-31 NOTE — Discharge Instructions (Signed)
1) complete abstinence from tobacco advised--okay to use over-the-counter nicotine patch to help you quit smoking ?2) please note that your blood pressure medications have been adjusted ?3) please follow-up with your orthopedic/spine specialist as previously advised ?

## 2022-01-31 NOTE — Evaluation (Signed)
Physical Therapy Evaluation ?Patient Details ?Name: Peter Campbell ?MRN: [id number] ?DOB: 12/03/1955 ?Today's Date: 01/31/2022 ? ?History of Present Illness ? Peter Campbell is a 66 y.o. male with medical history significant of hypertension, GERD, CAD, chronic tobacco use (1.5 PPD) who presents to the emergency department via EMS due to 1 day onset of reported chest pain.  At bedside, patient denies any form of chest pain, he states that he has had chest pain before which was pressure-like, but he denies any chest pain at this time.  However, patient endorsed a sudden onset of increased BP which started yesterday, this was associated with sweating, on checking his blood pressure it was about 190/105, patient was unsure if the sudden increase in BP was related to his history of back pain with a pinched nerve and pain radiation to left knee.  Patient states that symptoms eventually resolved without any measures.  After dinner tonight, he had a recurrence increase BP, with sweating and minimal nonradiating left-sided chest discomfort (which was in comparable to the type of chest pain he had when he had a heart attack).  BP was checked and was 190/110 (per patient), he took home BP meds and activated  EMS.  On arrival of EMS team, aspirin 325 mg was given by the rescue team without any improvement.  He was then taken to the ED for further evaluation and management. ?  ?Clinical Impression ? Patient functioning near baseline for functional mobility and gait other than having to limit weightbearing on LLE due to increasing left sided low back pain.  Patient ambulated without loss of balance without use of AD, but states less pain in low back during trial of ambulation using RW.  Patient plans to follow up with surgeon for possible surgery to low back and advised to use RW when having to walk longer distances.  Patient tolerated sitting up in chair after therapy.  Patient will benefit from continued skilled physical therapy in  hospital and recommended venue below to increase strength, balance, endurance for safe ADLs and gait.  ? ?   ?   ? ?Recommendations for follow up therapy are one component of a multi-disciplinary discharge planning process, led by the attending physician.  Recommendations may be updated based on patient status, additional functional criteria and insurance authorization. ? ?Follow Up Recommendations No PT follow up ? ?  ?Assistance Recommended at Discharge PRN  ?Patient can return home with the following ? Help with stairs or ramp for entrance ? ?  ?Equipment Recommendations None recommended by PT;Cane  ?Recommendations for Other Services ?    ?  ?Functional Status Assessment Patient has had a recent decline in their functional status and/or demonstrates limited ability to make significant improvements in function in a reasonable and predictable amount of time  ? ?  ?Precautions / Restrictions Precautions ?Precautions: Fall ?Restrictions ?Weight Bearing Restrictions: No  ? ?  ? ?Mobility ? Bed Mobility ?Overal bed mobility: Modified Independent ?  ?  ?  ?  ?  ?  ?  ?  ? ?Transfers ?Overall transfer level: Modified independent ?Equipment used: None, Rolling walker (2 wheels) ?  ?  ?  ?  ?  ?  ?  ?General transfer comment: slightly labored movement ?  ? ?Ambulation/Gait ?Ambulation/Gait assistance: Modified independent (Device/Increase time), Supervision ?Gait Distance (Feet): 75 Feet ?Assistive device: None, Rolling walker (2 wheels) ?Gait Pattern/deviations: Decreased step length - left, Decreased stance time - left, Antalgic, Decreased stride length ?Gait velocity:  decreased ?  ?  ?General Gait Details: limited weight bearing on LLE due to increasing back pain, increased time with slightly labored movement, no loss of balance, better tolerance for walking with less low back pain when using RW ? ?Stairs ?  ?  ?  ?  ?  ? ?Wheelchair Mobility ?  ? ?Modified Rankin (Stroke Patients Only) ?  ? ?  ? ?Balance Overall  balance assessment: Needs assistance ?Sitting-balance support: Feet supported, No upper extremity supported ?Sitting balance-Leahy Scale: Good ?Sitting balance - Comments: seated at EOB ?  ?Standing balance support: During functional activity, No upper extremity supported ?Standing balance-Leahy Scale: Fair ?Standing balance comment: fair/good using RW ?  ?  ?  ?  ?  ?  ?  ?  ?  ?  ?  ?   ? ? ? ?Pertinent Vitals/Pain Pain Assessment ?Pain Assessment: Faces ?Faces Pain Scale: Hurts little more ?Pain Location: left side low back when walking ?Pain Descriptors / Indicators: Sore, Guarding ?Pain Intervention(s): Limited activity within patient's tolerance, Monitored during session, Repositioned  ? ? ?Home Living Family/patient expects to be discharged to:: Private residence ?Living Arrangements: Non-relatives/Friends ?Available Help at Discharge: Friend(s);Available 24 hours/day ?Type of Home: House ?Home Access: Stairs to enter ?Entrance Stairs-Rails: Right;Left;Can reach both ?Entrance Stairs-Number of Steps: 5 ?  ?Home Layout: One level ?Home Equipment: Agricultural consultant (2 wheels) ?   ?  ?Prior Function Prior Level of Function : Independent/Modified Independent ?  ?  ?  ?  ?  ?  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  ? ? ?Hand Dominance  ? Dominant Hand: Right ? ?  ?Extremity/Trunk Assessment  ? Upper Extremity Assessment ?Upper Extremity Assessment: Defer to OT evaluation ?  ? ?Lower Extremity Assessment ?Lower Extremity Assessment: Overall WFL for tasks assessed;LLE deficits/detail ?LLE Deficits / Details: grossly 4/5 ?LLE: Unable to fully assess due to pain ?LLE Sensation: WNL ?LLE Coordination: WNL ?  ? ?Cervical / Trunk Assessment ?Cervical / Trunk Assessment: Normal  ?Communication  ? Communication: No difficulties  ?Cognition Arousal/Alertness: Awake/alert ?Behavior During Therapy: [facility] for tasks assessed/performed ?Overall Cognitive Status: Within  Functional Limits for tasks assessed ?  ?  ?  ?  ?  ?  ?  ?  ?  ?  ?  ?  ?  ?  ?  ?  ?  ?  ?  ? ?  ?General Comments   ? ?  ?Exercises    ? ?Assessment/Plan  ?  ?PT Assessment Patient needs continued PT services  ?PT Problem List Decreased strength;Decreased activity tolerance;Decreased balance;Decreased mobility ? ?   ?  ?PT Treatment Interventions DME instruction;Gait training;Stair training;Functional mobility training;Therapeutic activities;Therapeutic exercise;Balance training;Patient/family education   ? ?PT Goals (Current goals can be found in the Care Plan section)  ?Acute Rehab PT Goals ?Patient Stated Goal: return  home with friend to assist ?PT Goal Formulation: With patient ?Time For Goal Achievement: 02/03/22 ?Potential to Achieve Goals: Good ? ?  ?Frequency Min 2X/week ?  ? ? ?Co-evaluation   ?  ?  ?  ?  ? ? ?  ?AM-PAC PT "6 Clicks" Mobility  ?Outcome Measure Help needed turning from your back to your side while in a flat bed without using bedrails?: None ?Help needed moving from lying on your back to sitting on the side of a flat bed without using bedrails?: None ?Help needed moving to and from a bed to a chair (including a  wheelchair)?: None ?Help needed standing up from a chair using your arms (e.g., wheelchair or bedside chair)?: None ?Help needed to walk in hospital room?: A Little ?Help needed climbing 3-5 steps with a railing? : A Little ?6 Click Score: 22 ? ?  ?End of Session   ?Activity Tolerance: Patient tolerated treatment well;Patient limited by pain ?Patient left: in chair;with call bell/phone within reach ?Nurse Communication: Mobility status ?PT Visit Diagnosis: Unsteadiness on feet (R26.81);Other abnormalities of gait and mobility (R26.89);Muscle weakness (generalized) (M62.81) ?  ? ?Time: [phone] ?PT Time Calculation (min) (ACUTE ONLY): 20 min ? ? ?Charges:   PT Evaluation ?$PT Eval Moderate Complexity: 1 Mod ?PT Treatments ?$Therapeutic Activity: 8-22 mins ?  ?   ? ? ?10:35 AM,  01/31/22 ?Ocie Bob, MPT ?Physical Therapist with [place] ?[facility] ?[phone] office ?[phone] mobile phone ? ? ?

## 2022-01-31 NOTE — Progress Notes (Signed)
Pt discharged via WC to POV. 

## 2022-01-31 NOTE — Discharge Summary (Signed)
Peter Campbell, is a 66 y.o. male  DOB 03/04/1956  MRN [id number].  Admission date:  01/30/2022  Admitting Physician  Bernadette Hoit, DO  Discharge Date:  01/31/2022   Primary MD  Abran Richard, MD  Recommendations for primary care physician for things to follow:   1) complete abstinence from tobacco advised--okay to use over-the-counter nicotine patch to help you quit smoking 2) please note that your blood pressure medications have been adjusted 3) please follow-up with your orthopedic/spine specialist as previously advised  Admission Diagnosis  Precordial chest pain [R07.2] Hypertensive urgency [I16.0]   Discharge Diagnosis  Precordial chest pain [R07.2] Hypertensive urgency [I16.0]    Principal Problem:   Hypertensive urgency Active Problems:   Chronic back pain   Insomnia   Pulmonary nodule   Coronary atherosclerosis of native coronary artery   Tobacco abuse   Essential hypertension   COPD (chronic obstructive pulmonary disease) (HCC)   GERD (gastroesophageal reflux disease)   Leukocytosis      Past Medical History:  Diagnosis Date   Arthritis    Chronic back pain    Collagen vascular disease (HCC)    Colonic mass    History, s/p removal; per patient.   Constipation    COPD (chronic obstructive pulmonary disease) (HCC)    Coronary atherosclerosis of native coronary artery    Dense coronary atherosclerosis by chest CT November 2014    Dyspnea    when walking distance   Essential hypertension    Essential tremor    History of chicken pox    History of [place] measles    Pneumonia    2015 ish   Sciatica     Past Surgical History:  Procedure Laterality Date   CIRCUMCISION     COLON SURGERY  '80's   for mass removal, colonostomy   COLONOSCOPY WITH PROPOFOL N/A 05/03/2017   Two 3-5 mm polyps in rectum and descending colon s/p removal, pancolonic diverticulosis, internal  hemorrhoids. Benign. Surveillance due in 5 years.    colonostomy reversal  80's   LUMBAR FUSION  11/27/2019   Lumber discectomy     Middle finger Right    fracture   NASAL SEPTUM SURGERY     POLYPECTOMY  05/03/2017   Procedure: POLYPECTOMY;  Surgeon: Daneil Dolin, MD;  Location: [facility];  Service: Endoscopy;;  descending colon and rectal   SHOULDER ACROMIOPLASTY Right 06/12/2014   Procedure: RIGHT SHOULDER ACROMIOPLASTY;  Surgeon: [name], MD;  Location: [facility];  Service: Orthopedics;  Laterality: Right;   SHOULDER OPEN ROTATOR CUFF REPAIR Right 06/12/2014   Procedure: OPEN REPAIR ROTATOR CUFF RIGHT ;  Surgeon: [name], MD;  Location: [facility];  Service: Orthopedics;  Laterality: Right;   TONSILLECTOMY     TYMPANOSTOMY TUBE PLACEMENT       HPI  from the history and physical done on the day of admission:   HPI: Peter Campbell is a 66 y.o. male with medical history significant of hypertension, GERD, CAD, chronic tobacco use (1.5  PPD) who presents to the emergency department via EMS due to 1 day onset of reported chest pain.  At bedside, patient denies any form of chest pain, he states that he has had chest pain before which was pressure-like, but he denies any chest pain at this time.  However, patient endorsed a sudden onset of increased BP which started yesterday, this was associated with sweating, on checking his blood pressure it was about 190/105, patient was unsure if the sudden increase in BP was related to his history of back pain with a pinched nerve and pain radiation to left knee.  Patient states that symptoms eventually resolved without any measures.  After dinner tonight, he had a recurrence increase BP, with sweating and minimal nonradiating left-sided chest discomfort (which was in comparable to the type of chest pain he had when he had a heart attack).  BP was checked and was 190/110 (per patient), he took home BP meds and activated  EMS.  On arrival of EMS team, aspirin  325 mg was given by the rescue team without any improvement.  He was then taken to the ED for further evaluation and management.   ED Course:  In the emergency department, BP was 193/54 and it was initially tachypneic, other vital signs were within normal range.  Work-up in the ED showed normal CBC except for leukocytosis, normal BMP, troponin x1 was 4.   Chest x-ray showed no acute cardiopulmonary disease DuoNeb was given, nitroglycerin sublingual x1 was given without any improvement.  IV morphine 4 mg x 1 was given.  Hospitalist was asked to admit patient for further evaluation and management.   Review of Systems: Review of systems as noted in the HPI. All other systems reviewed and are negative.   Hospital Course:   Assessment and Plan: Hypertensive urgency-resolved Essential hypertension-better controlled Okay to discharge on isosorbide, metoprolol and amlodipine   Chronic back pain Patient complained of pinched nerve in the lower back with pain radiation to left knee which could have contributed to above considering increased back pain and discomfort during elevated BP Continue Percocet per home regimen Continue PT/OT eval and treat -Outpatient follow-up with orthopedic/spine specialist as previously scheduled  Atypical chest pain On further questioning patient states he never actually had chest pains, as per report in triage area that he may have had chest pains  =-patient denies any typical/atypical chest pain reported on triage Cardiovascular risk factors include hypertension, CAD, tobacco abuse -EKG showed normal sinus rhythm at a rate of 68 bpm -Ruled out for ACS by cardiac enzymes and EKG -Continue aspirin, metoprolol and Lasix Echocardiogram with preserved EF of 55 to 60%, no regional wall motion normalities   Chronic leukocytosis -Outpatient follow-up   Questionable pulmonary nodular density Chest x-ray showed questionable nodular density in the left lung  base Follow-up nonemergent chest CT was recommended--patient is aware  GERD Continue Protonix  CAD Continue nitroglycerin, aspirin   COPD Continue albuterol and DuoNeb   Insomnia Continue home Restoril   Tobacco abuse -Smoking cessation advised Continue nicotine patch   Discharge Condition: Stable, chest pain-free  Follow UP   Follow-up Information     Abran Richard, MD. Schedule an appointment as soon as possible for a visit in 1 week(s).   Specialty: Internal Medicine Why: Blood pressure recheck Contact information: [address] [place] [address] [phone]         Satira Sark, MD .   Specialty: Cardiology Contact information: [address]  [place] [address] [phone]                 Diet and Activity recommendation:  As advised  Discharge Instructions    Discharge Instructions     Call MD for:  difficulty breathing, headache or visual disturbances   Complete by: As directed    Call MD for:  persistant dizziness or light-headedness   Complete by: As directed    Call MD for:  persistant nausea and vomiting   Complete by: As directed    Call MD for:  temperature >100.4   Complete by: As directed    Diet - low sodium heart healthy   Complete by: As directed    Discharge instructions   Complete by: As directed    1) complete abstinence from tobacco advised--okay to use over-the-counter nicotine patch to help you quit smoking 2) please note that your blood pressure medications have been adjusted 3) please follow-up with your orthopedic/spine specialist as previously advised   Increase activity slowly   Complete by: As directed          Discharge Medications     Allergies as of 01/31/2022       Reactions   Fentanyl Other (See Comments)   Ineffective        Medication List     STOP taking these medications    Diclofenac Sodium CR 100 MG 24 hr tablet   guaiFENesin-dextromethorphan 100-10 MG/5ML  syrup Commonly known as: ROBITUSSIN DM   predniSONE 5 MG (21) Tbpk tablet Commonly known as: STERAPRED UNI-PAK 21 TAB   saccharomyces boulardii 250 MG capsule Commonly known as: FLORASTOR       TAKE these medications    albuterol 108 (90 Base) MCG/ACT inhaler Commonly known as: VENTOLIN HFA Inhale 2 puffs into the lungs every 4 (four) hours as needed for wheezing or shortness of breath. What changed:  how much to take when to take this   amLODipine 10 MG tablet Commonly known as: NORVASC Take 1 tablet (10 mg total) by mouth daily.   aspirin EC 81 MG tablet Take 1 tablet (81 mg total) by mouth daily with breakfast. What changed: when to take this   budesonide-formoterol 160-4.5 MCG/ACT inhaler Commonly known as: Symbicort Inhale 2 puffs into the lungs 2 (two) times daily.   gabapentin 600 MG tablet Commonly known as: NEURONTIN Take 1 tablet (600 mg total) by mouth 3 (three) times daily. What changed: when to take this   ipratropium-albuterol 0.5-2.5 (3) MG/3ML Soln Commonly known as: DUONEB Take 3 mLs by nebulization every 6 (six) hours as needed (shortness of breath).   isosorbide mononitrate 30 MG 24 hr tablet Commonly known as: IMDUR Take 1 tablet (30 mg total) by mouth daily.   Linzess 290 MCG Caps capsule Generic drug: linaclotide TAKE 1 TABLET BY MOUTH ONCE DAILY BEFORE BREAKFAST. What changed: how much to take   metoprolol succinate 25 MG 24 hr tablet Commonly known as: TOPROL-XL TAKE 2 TABLETS BY MOUTH IN THE MORNING AND 1 TABLET BY MOUTH IN THE EVENING. What changed: See the new instructions.   Narcan 4 MG/0.1ML Liqd nasal spray kit Generic drug: naloxone Place 1 spray into the nose as needed (as directed).   nicotine 21 mg/24hr patch Commonly known as: NICODERM CQ - dosed in mg/24 hours Place 1 patch (21 mg total) onto the skin daily.   nitroGLYCERIN 0.4 MG SL tablet Commonly known as: NITROSTAT Place 1 tablet (0.4 mg total) under the  tongue  every 5 (five) minutesas needed for chest pain.   oxyCODONE-acetaminophen 7.5-325 MG tablet Commonly known as: PERCOCET Take 1 tablet by mouth 4 (four) times daily as needed for moderate pain.   pantoprazole 40 MG tablet Commonly known as: PROTONIX Take 1 tablet (40 mg total) by mouth daily.   temazepam 30 MG capsule Commonly known as: RESTORIL Take 1 capsule (30 mg total) by mouth at bedtime. What changed: how much to take   Xtampza ER 13.5 MG C12a Generic drug: oxyCODONE ER Take 1 capsule by mouth 2 (two) times daily.        Major procedures and Radiology Reports - PLEASE review detailed and final reports for all details, in brief -   DG Chest 2 View  Result Date: 01/30/2022 CLINICAL DATA:  Chest pain EXAM: CHEST - 2 VIEW COMPARISON:  Chest x-ray 10/02/2021. chest CT 10/02/2021. FINDINGS: The heart size and mediastinal contours are within normal limits. There is no pleural effusion or pneumothorax. There are minimal lingular opacities, unchanged from prior. Small nodular density projects over the left lower lung, not definitely seen on prior brain minimal right upper lobe scarring again noted. The visualized skeletal structures are unremarkable. IMPRESSION: 1.  No acute cardiopulmonary disease. 2. Questionable nodular density in the left lung base. Recommend follow-up chest CT. This can be performed non emergently. Electronically Signed   By: Ronney Asters M.D.   On: 01/30/2022 21:19   DG Lumbar Spine Complete  Result Date: 01/06/2022 Clinical:  Lower back pain with left sided sciatica X-rays were done of the lumbar spine, five views. There is normal lumbar lordosis.  He is post surgery of the lower lumbar spine with artificial disc at L4L5 and screws and plate at Z6S0.  Mild degenerative changes are present of the lumbar spine.  No fracture is noted.  Bone quality is good. Impression: post surgery L4L5 of lumbar spine, diffuse degenerative changes Electronically Signed [name],  MD 4/11/20239:46 AM   MR Lumbar Spine Wo Contrast  Result Date: 01/14/2022 CLINICAL DATA:  Lumbar radiculopathy. Low back pain radiates into left leg for 1 month. History of lumbar fusion surgery 11/2019. EXAM: MRI LUMBAR SPINE WITHOUT CONTRAST TECHNIQUE: Multiplanar, multisequence MR imaging of the lumbar spine was performed. No intravenous contrast was administered. COMPARISON:  MR lumbar 12/11/2019; X-ray lumbar 01/06/2022; CT lumbar 03/29/2020. FINDINGS: Segmentation:  Standard. Alignment: Mild levocurvature. Trace anterolisthesis L4 on L5, unchanged. Vertebrae: No acute fracture or suspicious osseous lesion status post posterior fusion and decompression L4-L5 and left L5-S1 hemilaminectomy. Conus medullaris and cauda equina: Conus extends to the L1-L2 level. Conus and cauda equina appear normal. Paraspinal and other soft tissues: Postoperative changes in the soft tissues posterior to L4 and L5. Disc levels: T12-L1: No significant disc bulge. No spinal canal stenosis or neural foraminal narrowing. L1-L2: No significant disc bulge. No spinal canal stenosis or neural foraminal narrowing. L2-L3: Mild disc bulge. Mild facet arthropathy. No spinal canal stenosis or neural foraminal narrowing. L3-L4: Mild disc bulge. Mild facet arthropathy. No spinal canal stenosis. Mild left neural foraminal narrowing, slightly progressed from prior exam. L4-L5: Posterior fusion and decompression. No spinal canal stenosis or neural foraminal narrowing. L5-S1: Central and left paracentral disc extrusion, which has increased from the prior exam, which narrows the left lateral recess and likely contacts the descending left S1 nerve roots (series 8, image 33). Moderate right and mild left facet arthropathy. No neural foraminal narrowing. IMPRESSION: 1. Interval increase in the size of a disc protrusion  at L5-S1, which narrows the left lateral recess and likely contacts the descending left S1 nerve roots. 2. L3-L4 mild left neural  foraminal narrowing, which has slightly progressed from the prior exam. 3. Unchanged appearance of postsurgical changes at L4-L5 and L5-S1. Electronically Signed   By: Merilyn Baba M.D.   On: 01/14/2022 11:10   ECHOCARDIOGRAM COMPLETE  Result Date: 01/31/2022    ECHOCARDIOGRAM REPORT   Patient Name:   Peter Campbell Date of Exam: 01/31/2022 Medical Rec #:  [id number]    Height:       70.0 in Accession #:    [id number]   Weight:       168.2 lb Date of Birth:  08-12-1956    BSA:          1.939 m Patient Age:    21 years     BP:           146/85 mmHg Patient Gender: M            HR:           64 bpm. Exam Location:  [facility] Procedure: 2D Echo, Color Doppler and Cardiac Doppler Indications:    R07.9* Chest pain, unspecified  History:        Patient has no prior history of Echocardiogram examinations.                 COPD; Risk Factors:Hypertension.  Sonographer:    Raquel Sarna Senior RDCS Referring Phys: [id number] OLADAPO ADEFESO  Sonographer Comments: Technically difficult due to COPD IMPRESSIONS  1. Left ventricular ejection fraction, by estimation, is 55 to 60%. The left ventricle has normal function. The left ventricle has no regional wall motion abnormalities. Left ventricular diastolic parameters are indeterminate.  2. Right ventricular systolic function is normal. The right ventricular size is normal. Tricuspid regurgitation signal is inadequate for assessing PA pressure.  3. Left atrial size was mild to moderately dilated.  4. The mitral valve is grossly normal. Trivial mitral valve regurgitation.  5. The aortic valve is tricuspid. Aortic valve regurgitation is not visualized.  6. The inferior vena cava is normal in size with greater than 50% respiratory variability, suggesting right atrial pressure of 3 mmHg. Comparison(s): No prior Echocardiogram. FINDINGS  Left Ventricle: Left ventricular ejection fraction, by estimation, is 55 to 60%. The left ventricle has normal function. The left ventricle has no regional wall  motion abnormalities. The left ventricular internal cavity size was normal in size. There is  borderline left ventricular hypertrophy. Left ventricular diastolic parameters are indeterminate. Right Ventricle: The right ventricular size is normal. No increase in right ventricular wall thickness. Right ventricular systolic function is normal. Tricuspid regurgitation signal is inadequate for assessing PA pressure. Left Atrium: Left atrial size was mild to moderately dilated. Right Atrium: Right atrial size was normal in size. Pericardium: There is no evidence of pericardial effusion. Mitral Valve: The mitral valve is grossly normal. Trivial mitral valve regurgitation. Tricuspid Valve: The tricuspid valve is grossly normal. Tricuspid valve regurgitation is trivial. Aortic Valve: The aortic valve is tricuspid. There is mild aortic valve annular calcification. Aortic valve regurgitation is not visualized. Pulmonic Valve: The pulmonic valve was grossly normal. Pulmonic valve regurgitation is trivial. Aorta: The aortic root is normal in size and structure. Venous: The inferior vena cava is normal in size with greater than 50% respiratory variability, suggesting right atrial pressure of 3 mmHg. IAS/Shunts: No atrial level shunt detected by color flow Doppler.  LEFT VENTRICLE PLAX 2D  LVIDd:         5.00 cm   Diastology LVIDs:         3.40 cm   LV e' medial:    8.49 cm/s LV PW:         1.10 cm   LV E/e' medial:  6.4 LV IVS:        0.90 cm   LV e' lateral:   7.40 cm/s LVOT diam:     2.20 cm   LV E/e' lateral: 7.4 LV SV:         84 LV SV Index:   44 LVOT Area:     3.80 cm  RIGHT VENTRICLE RV S prime:     12.30 cm/s TAPSE (M-mode): 2.6 cm LEFT ATRIUM             Index        RIGHT ATRIUM           Index LA diam:        4.30 cm 2.22 cm/m   RA Area:     13.20 cm LA Vol (A2C):   61.2 ml 31.56 ml/m  RA Volume:   29.90 ml  15.42 ml/m LA Vol (A4C):   78.9 ml 40.69 ml/m LA Biplane Vol: 72.5 ml 37.39 ml/m  AORTIC VALVE LVOT Vmax:    91.40 cm/s LVOT Vmean:  68.900 cm/s LVOT VTI:    0.222 m  AORTA Ao Root diam: 3.70 cm Ao Asc diam:  3.10 cm MITRAL VALVE MV Area (PHT): 2.51 cm    SHUNTS MV Decel Time: 302 msec    Systemic VTI:  0.22 m MV E velocity: 54.40 cm/s  Systemic Diam: 2.20 cm MV A velocity: 87.80 cm/s MV E/A ratio:  0.62 Rozann Lesches MD Electronically signed by Rozann Lesches MD Signature Date/Time: 01/31/2022/2:18:30 PM    Final     Micro Results   Today   Subjective    Peter Campbell today has no new complaints  -Denies chest pain shortness of breath ,palpitation or dizziness          Patient has been seen and examined prior to discharge   Objective   Blood pressure (!) 162/112, pulse 61, temperature 97.9 F (36.6 C), temperature source Axillary, resp. rate 20, height 5' 10"  (1.778 m), weight 76.3 kg, SpO2 96 %.   Intake/Output Summary (Last 24 hours) at 01/31/2022 1537 Last data filed at 01/31/2022 1300 Gross per 24 hour  Intake 480 ml  Output 1050 ml  Net -570 ml    Exam Gen:- Awake Alert, no acute distress  HEENT:- [place].AT, No sclera icterus Neck-Supple Neck,No JVD,.  Lungs-  CTAB , good air movement bilaterally CV- S1, S2 normal, regular Abd-  +ve B.Sounds, Abd Soft, No tenderness,    Extremity/Skin:- No  edema,   good pulses Psych-affect is appropriate, oriented x3 Neuro-no new focal deficits, no tremors    Data Review   CBC w Diff:  Lab Results  Component Value Date   WBC 9.5 01/31/2022   HGB 15.8 01/31/2022   HCT 48.9 01/31/2022   PLT 227 01/31/2022   LYMPHOPCT 15 03/29/2020   MONOPCT 8 03/29/2020   EOSPCT 1 03/29/2020   BASOPCT 1 03/29/2020    CMP:  Lab Results  Component Value Date   NA 138 01/31/2022   K 3.4 (L) 01/31/2022   CL 103 01/31/2022   CO2 28 01/31/2022   BUN 21 01/31/2022   CREATININE 0.59 (L) 01/31/2022   PROT 6.5  01/31/2022   ALBUMIN 3.9 01/31/2022   BILITOT 1.0 01/31/2022   ALKPHOS 82 01/31/2022   AST 11 (L) 01/31/2022   ALT 12 01/31/2022  .  Total  Discharge time is about 33 minutes  Peter Campbell M.D on 01/31/2022 at 3:37 PM  Go to [email or web address] -  for contact info  [facility] - Office  [phone]

## 2022-01-31 NOTE — Plan of Care (Signed)

## 2022-01-31 NOTE — Plan of Care (Signed)
?  Problem: Acute Rehab PT Goals(only PT should resolve) ?Goal: Pt Will Go Supine/Side To Sit ?Outcome: Progressing ?Flowsheets (Taken 01/31/2022 1037) ?Pt will go Supine/Side to Sit: Independently ?Goal: Patient Will Transfer Sit To/From Stand ?Outcome: Progressing ?Flowsheets (Taken 01/31/2022 1037) ?Patient will transfer sit to/from stand: ? with modified independence ? Independently ?Goal: Pt Will Transfer Bed To Chair/Chair To Bed ?Outcome: Progressing ?Flowsheets (Taken 01/31/2022 1037) ?Pt will Transfer Bed to Chair/Chair to Bed: ? with modified independence ? Independently ?Goal: Pt Will Ambulate ?Outcome: Progressing ?Flowsheets (Taken 01/31/2022 1037) ?Pt will Ambulate: ? > 125 feet ? with modified independence ? with least restrictive assistive device ?  ?10:38 AM, 01/31/22 ?Ocie Bob, MPT ?Physical Therapist with [place] ?[facility] ?[phone] office ?[phone] mobile phone ? ?

## 2022-02-02 ENCOUNTER — Telehealth: Payer: Self-pay

## 2022-02-02 NOTE — Telephone Encounter (Signed)
Patient called to say that he missed his appointment with surgeon.  ?His children are wanting him to come to [facility] and stay with them, so they can ?take care of him. He is asking for a referral to a neurosurgeon in [place], since he  ?will be staying down there. ? ?Please advise ?

## 2022-02-17 ENCOUNTER — Other Ambulatory Visit: Payer: Self-pay | Admitting: Cardiology

## 2022-03-03 ENCOUNTER — Encounter: Payer: Self-pay | Admitting: Orthopaedic Surgery

## 2022-03-03 ENCOUNTER — Ambulatory Visit (INDEPENDENT_AMBULATORY_CARE_PROVIDER_SITE_OTHER): Payer: Medicare Other | Admitting: Orthopaedic Surgery

## 2022-03-03 DIAGNOSIS — M25511 Pain in right shoulder: Secondary | ICD-10-CM

## 2022-03-03 DIAGNOSIS — G8929 Other chronic pain: Secondary | ICD-10-CM | POA: Diagnosis not present

## 2022-03-03 NOTE — Progress Notes (Signed)
PROCEDURE NOTE:  The patient request injection, verbal consent was obtained.  The right shoulder was prepped appropriately after time out was performed.   Sterile technique was observed and injection of 1 cc of DepoMedrol 40mg  with several cc's of plain xylocaine. Anesthesia was provided by ethyl chloride and a 20-gauge needle was used to inject the shoulder area. A posterior approach was used.  The injection was tolerated well.  A band aid dressing was applied.  The patient was advised to apply ice later today and tomorrow to the injection sight as needed.  Encounter Diagnosis  Name Primary?   Chronic right shoulder pain Yes   Return in five weeks.  Call if any problem.  Precautions discussed.  Electronically Signed , MD 6/6/20231:49 PM

## 2022-03-13 ENCOUNTER — Encounter: Payer: Self-pay | Admitting: Student

## 2022-03-13 ENCOUNTER — Ambulatory Visit (INDEPENDENT_AMBULATORY_CARE_PROVIDER_SITE_OTHER): Payer: Medicare Other | Admitting: Student

## 2022-03-13 VITALS — BP 126/70 | HR 67 | Ht 70.0 in | Wt 170.0 lb

## 2022-03-13 DIAGNOSIS — R002 Palpitations: Secondary | ICD-10-CM | POA: Diagnosis not present

## 2022-03-13 DIAGNOSIS — I2510 Atherosclerotic heart disease of native coronary artery without angina pectoris: Secondary | ICD-10-CM | POA: Diagnosis not present

## 2022-03-13 DIAGNOSIS — I10 Essential (primary) hypertension: Secondary | ICD-10-CM

## 2022-03-13 MED ORDER — AMLODIPINE BESYLATE 5 MG PO TABS: 5.0000 mg | ORAL_TABLET | Freq: Every day | ORAL | 3 refills | Status: DC

## 2022-03-13 MED ORDER — METOPROLOL SUCCINATE ER 25 MG PO TB24: ORAL_TABLET | ORAL | 3 refills | Status: DC

## 2022-03-13 NOTE — Progress Notes (Unsigned)
Cardiology Office Note    Date:  03/14/2022   ID:  Peter Campbell, DOB Sep 16, 1956, MRN [id number]  PCP:  Abran Richard, MD  Cardiologist: Rozann Lesches, MD    Chief Complaint  Patient presents with   Hospitalization Follow-up    History of Present Illness:    Peter Campbell is a 66 y.o. male with past medical history of coronary calcifications by CT (low-risk NST in 2018), HTN and COPD who presents to the office today for hospital follow-up.  He was last examined by Dr. Domenic Polite in 08/2020 and denied any recent anginal symptoms at that time. He was continued on his current medical therapy including ASA 81 mg daily, Amlodipine 5 mg daily, Toprol-XL 50 mg in AM/25 mg in PM and PRN SL NTG.   He was admitted to [facility] in 01/2022 for evaluation of worsening back pain. Reported having a pinched nerve in his back and was treated with pain medication with outpatient PT follow-up recommended. He did report episodes of chest pain and ruled out for ACS. Echocardiogram was performed and showed a preserved EF of 55 to 60% with no regional wall motion abnormalities. By review of Care Everywhere, he was admitted to [facility] from 5/11  - 02/17/2022 for back pain and MRI showed small disc herniation at L5/S1 causing nerve root impingement. He underwent microdiscectomy on 02/11/2022. It appears Cardiology was consulted during admission for preoperative cardiac clearance and was found to have a calcium score greater than 400. He therefore underwent a repeat [place] which showed no evidence of ischemia.  In talking with the patient today, he reports his back and leg pain have significantly improved since undergoing surgery. He denies any recent chest pain or dyspnea on exertion. No recent orthopnea, PND or pitting edema. Reports he had been on Amlodipine 5 mg many years ago but this was discontinued in the interim secondary to hypotension. He was started Amlodipine 10 mg daily during recent admission  and is unsure if he needs to be on this dose or the medication at all. He did quit smoking earlier this year!  Past Medical History:  Diagnosis Date   Arthritis    Chronic back pain    Collagen vascular disease ([place])    Colonic mass    History, s/p removal; per patient.   Constipation    COPD (chronic obstructive pulmonary disease) (HCC)    Coronary atherosclerosis of native coronary artery    Dense coronary atherosclerosis by chest CT November 2014    Dyspnea    when walking distance   Essential hypertension    Essential tremor    History of chicken pox    History of [place] measles    Pneumonia    2015 ish   Sciatica     Past Surgical History:  Procedure Laterality Date   CIRCUMCISION     COLON SURGERY  '80's   for mass removal, colonostomy   COLONOSCOPY WITH PROPOFOL N/A 05/03/2017   Two 3-5 mm polyps in rectum and descending colon s/p removal, pancolonic diverticulosis, internal hemorrhoids. Benign. Surveillance due in 5 years.    colonostomy reversal  80's   LUMBAR FUSION  11/27/2019   Lumber discectomy     Middle finger Right    fracture   NASAL SEPTUM SURGERY     POLYPECTOMY  05/03/2017   Procedure: POLYPECTOMY;  Surgeon: Daneil Dolin, MD;  Location: [facility];  Service: Endoscopy;;  descending colon and rectal   SHOULDER  ACROMIOPLASTY Right 06/12/2014   Procedure: RIGHT SHOULDER ACROMIOPLASTY;  Surgeon: [name], MD;  Location: [facility] ORS;  Service: Orthopedics;  Laterality: Right;   SHOULDER OPEN ROTATOR CUFF REPAIR Right 06/12/2014   Procedure: OPEN REPAIR ROTATOR CUFF RIGHT ;  Surgeon: [name], MD;  Location: [facility] ORS;  Service: Orthopedics;  Laterality: Right;   TONSILLECTOMY     TYMPANOSTOMY TUBE PLACEMENT      Current Medications: Outpatient Medications Prior to Visit  Medication Sig Dispense Refill   albuterol (VENTOLIN HFA) 108 (90 Base) MCG/ACT inhaler Inhale 2 puffs into the lungs every 4 (four) hours as needed for wheezing or shortness of  breath. 18 g 1   aspirin EC 81 MG tablet Take 1 tablet (81 mg total) by mouth daily with breakfast. 30 tablet 2   budesonide-formoterol (SYMBICORT) 160-4.5 MCG/ACT inhaler Inhale 2 puffs into the lungs 2 (two) times daily. 10.2 g 3   diclofenac (CATAFLAM) 50 MG tablet Take 50 mg by mouth 2 (two) times daily.     gabapentin (NEURONTIN) 600 MG tablet Take 1 tablet (600 mg total) by mouth 3 (three) times daily. 90 tablet 3   ipratropium-albuterol (DUONEB) 0.5-2.5 (3) MG/3ML SOLN Take 3 mLs by nebulization every 6 (six) hours as needed (shortness of breath). 75 mL 2   LINZESS 290 MCG CAPS capsule TAKE 1 TABLET BY MOUTH ONCE DAILY BEFORE BREAKFAST. (Patient taking differently: Take 290 mcg by mouth daily before breakfast.) 90 capsule 0   naloxone (NARCAN) 4 MG/0.1ML LIQD nasal spray kit Place 1 spray into the nose as needed (as directed).     nicotine (NICODERM CQ - DOSED IN MG/24 HOURS) 21 mg/24hr patch Place 1 patch (21 mg total) onto the skin daily. 30 patch 1   nitroGLYCERIN (NITROSTAT) 0.4 MG SL tablet Place 1 tablet (0.4 mg total) under the tongue every 5 (five) minutesas needed for chest pain. 25 tablet 3   oxyCODONE-acetaminophen (PERCOCET) 7.5-325 MG tablet Take 1 tablet by mouth 4 (four) times daily as needed for moderate pain.     pantoprazole (PROTONIX) 40 MG tablet Take 1 tablet (40 mg total) by mouth daily. 30 tablet 11   temazepam (RESTORIL) 30 MG capsule Take 1 capsule (30 mg total) by mouth at bedtime. 30 capsule 0   XTAMPZA ER 13.5 MG C12A Take 1 capsule by mouth 2 (two) times daily.     amLODipine (NORVASC) 10 MG tablet Take 1 tablet (10 mg total) by mouth daily. 30 tablet 3   isosorbide mononitrate (IMDUR) 30 MG 24 hr tablet Take 1 tablet (30 mg total) by mouth daily. 30 tablet 11   metoprolol succinate (TOPROL-XL) 25 MG 24 hr tablet TAKE 2 TABLETS BY MOUTH IN THE MORNING AND 1 TABLET BY MOUTH IN THE EVENING. 90 tablet 0   No facility-administered medications prior to visit.      Allergies:   Fentanyl   Social History   Socioeconomic History   Marital status: Widowed    Spouse name: Not on file   Number of children: Not on file   Years of education: Not on file   Highest education level: Not on file  Occupational History   Occupation: Disabled  Tobacco Use   Smoking status: Former    Packs/day: 1.00    Years: 30.00    Total pack years: 30.00    Types: Cigarettes    Start date: 08/28/1969   Smokeless tobacco: Never  Vaping Use   Vaping Use: Never used  Substance and  Sexual Activity   Alcohol use: No    Alcohol/week: 0.0 standard drinks of alcohol   Drug use: Yes    Types: Marijuana, Other-see comments    Comment: Pt uses Xanax- prn   Sexual activity: Not on file  Other Topics Concern   Not on file  Social History Narrative   Pt lives alone in [place]; on disability   Social Determinants of Health   Financial Resource Strain: Not on file  Food Insecurity: Not on file  Transportation Needs: Not on file  Physical Activity: Not on file  Stress: Not on file  Social Connections: Not on file     Family History:  The patient's family history includes COPD in his father; Cancer in his father and mother; Epilepsy in his sister; Heart attack in his brother; Hypertension in his sister.   Review of Systems:    Please see the history of present illness.     All other systems reviewed and are otherwise negative except as noted above.   Physical Exam:    VS:  BP 126/70   Pulse 67   Ht 5' 10" (1.778 m)   Wt 170 lb (77.1 kg)   SpO2 94%   BMI 24.39 kg/m    General: Well developed, well nourished,male appearing in no acute distress. Head: Normocephalic, atraumatic. Neck: No carotid bruits. JVD not elevated.  Lungs: Respirations regular and unlabored, without wheezes or rales.  Heart: Regular rate and rhythm. No S3 or S4.  No murmur, no rubs, or gallops appreciated. Abdomen: Appears non-distended. No obvious abdominal masses. Msk:  Strength and  tone appear normal for age. No obvious joint deformities or effusions. Extremities: No clubbing or cyanosis. No pitting edema.  Distal pedal pulses are 2+ bilaterally. Neuro: Alert and oriented X 3. Moves all extremities spontaneously. No focal deficits noted. Psych:  Responds to questions appropriately with a normal affect. Skin: No rashes or lesions noted  Wt Readings from Last 3 Encounters:  03/13/22 170 lb (77.1 kg)  01/30/22 168 lb 3.4 oz (76.3 kg)  01/06/22 177 lb (80.3 kg)     Studies/Labs Reviewed:   EKG:  EKG is not ordered today.   Recent Labs: 01/31/2022: ALT 12; BUN 21; Creatinine, Ser 0.59; Hemoglobin 15.8; Magnesium 2.0; Platelets 227; Potassium 3.4; Sodium 138   Lipid Panel    Component Value Date/Time   CHOL 122 07/08/2017 0826   TRIG 104 07/08/2017 0826   HDL 33 (L) 07/08/2017 0826   CHOLHDL 3.7 07/08/2017 0826   VLDL 21 07/08/2017 0826   LDLCALC 68 07/08/2017 0826    Additional studies/ records that were reviewed today include:   NST: 06/2017 The study is normal. This is a low risk study. The left ventricular ejection fraction is normal (55-65%). There was no ST segment deviation noted during stress.   Diaphragmatic attenuation no ischemia. SDS 1. Normal wall motion EF 60%   Echo: 01/2022 IMPRESSIONS     1. Left ventricular ejection fraction, by estimation, is 55 to 60%. The  left ventricle has normal function. The left ventricle has no regional  wall motion abnormalities. Left ventricular diastolic parameters are  indeterminate.   2. Right ventricular systolic function is normal. The right ventricular  size is normal. Tricuspid regurgitation signal is inadequate for assessing  PA pressure.   3. Left atrial size was mild to moderately dilated.   4. The mitral valve is grossly normal. Trivial mitral valve  regurgitation.   5. The aortic valve is tricuspid.  Aortic valve regurgitation is not  visualized.   6. The inferior vena cava is normal in  size with greater than 50%  respiratory variability, suggesting right atrial pressure of 3 mmHg.   Comparison(s): No prior Echocardiogram.   NST: 01/2022 1.  Normal stress study after pharmacologic stress. No ischemia noted. Diaphragmatic attenuation noted.  2.  Stress data: The patient experienced no chest pain during the stress.Stress testing did not produce any symptoms suggestive of coronary artery disease.  3.  Myocardial perfusion imaging: The TID ratio is 1.11.  4.  Stress: The calculated EF is 58%.   Assessment:    1. Coronary artery disease involving native coronary artery of native heart without angina pectoris   2. Essential hypertension   3. Palpitations      Plan:   In order of problems listed above:  1. CAD - He has known coronary calcifications by prior CT and recent coronary CT showed a calcium score greater than 400 with NST showing no evidence of ischemia or infarction. He remains active at baseline and denies any recent anginal symptoms. Will continue with risk factor modification. Continue ASA 81 mg daily and  Toprol-XL 50 mg in AM/69m in PM. He is listed as being on Imdur but has not taken this in several months and denies any recent chest pain. He is not on statin therapy as his cholesterol has overall been well controlled and LDL was 80 in 01/2022. We did review possibly adding this in the future pending repeat labs given his CAD. He will focus on dietary changes in the interim.     2. HTN - His BP is well-controlled at 126/70 during today's visit. He is unsure he if needs to be on Amlodipine and given his well-controlled readings today, I recommended we gradually reduce this to 576mdaily and monitor his BP response. He will keep a BP log and return it in several weeks. Continue Toprol-XL at current dosing.   3. Palpitations - No recent symptoms. Continue Toprol-XL 5079mn AM/8m84m PM.    Medication Adjustments/Labs and Tests Ordered: Current medicines are  reviewed at length with the patient today.  Concerns regarding medicines are outlined above.  Medication changes, Labs and Tests ordered today are listed in the Patient Instructions below. Patient Instructions  Medication Instructions:  DECREASE Amlodipine to 5 mg daily   Labwork: None today  Testing/Procedures: None today  Follow-Up: 1 year with Dr.McDowell   Any Other Special Instructions Will Be Listed Below (If Applicable).   Keep daily BP log for 1 month, then return to office.  If you need a refill on your cardiac medications before your next appointment, please call your pharmacy.    Signed, BritErma Heritage-C  03/14/2022 5:40 PM    [id number] [place] [id number]: [phone]: [phone]

## 2022-03-13 NOTE — Patient Instructions (Signed)
Medication Instructions:  DECREASE Amlodipine to 5 mg daily   Labwork: None today  Testing/Procedures: None today  Follow-Up: 1 year with Dr.McDowell   Any Other Special Instructions Will Be Listed Below (If Applicable).   Keep daily BP log for 1 month, then return to office.  If you need a refill on your cardiac medications before your next appointment, please call your pharmacy.

## 2022-03-14 ENCOUNTER — Encounter: Payer: Self-pay | Admitting: Student

## 2022-03-19 ENCOUNTER — Other Ambulatory Visit: Payer: Self-pay | Admitting: Gastroenterology

## 2022-04-07 ENCOUNTER — Encounter: Payer: Self-pay | Admitting: Orthopaedic Surgery

## 2022-04-07 ENCOUNTER — Ambulatory Visit (INDEPENDENT_AMBULATORY_CARE_PROVIDER_SITE_OTHER): Payer: Medicare Other | Admitting: Orthopaedic Surgery

## 2022-04-07 DIAGNOSIS — M25511 Pain in right shoulder: Secondary | ICD-10-CM

## 2022-04-07 DIAGNOSIS — G8929 Other chronic pain: Secondary | ICD-10-CM | POA: Diagnosis not present

## 2022-04-07 NOTE — Progress Notes (Signed)
PROCEDURE NOTE:  The patient request injection, verbal consent was obtained.  The right shoulder was prepped appropriately after time out was performed.   Sterile technique was observed and injection of 1 cc of DepoMedrol 40mg  with several cc's of plain xylocaine. Anesthesia was provided by ethyl chloride and a 20-gauge needle was used to inject the shoulder area. A posterior approach was used.  The injection was tolerated well.  A band aid dressing was applied.  The patient was advised to apply ice later today and tomorrow to the injection sight as needed.  Encounter Diagnosis  Name Primary?   Chronic right shoulder pain Yes   Return in one month.  Call if any problem.  Precautions discussed.  Electronically Signed , MD 7/11/20231:41 PM

## 2022-04-09 ENCOUNTER — Encounter: Payer: Self-pay | Admitting: *Deleted

## 2022-05-05 ENCOUNTER — Ambulatory Visit (INDEPENDENT_AMBULATORY_CARE_PROVIDER_SITE_OTHER): Payer: Medicare Other | Admitting: Orthopaedic Surgery

## 2022-05-05 ENCOUNTER — Encounter: Payer: Self-pay | Admitting: Orthopaedic Surgery

## 2022-05-05 DIAGNOSIS — M25511 Pain in right shoulder: Secondary | ICD-10-CM | POA: Diagnosis not present

## 2022-05-05 DIAGNOSIS — G8929 Other chronic pain: Secondary | ICD-10-CM

## 2022-05-05 NOTE — Progress Notes (Signed)
PROCEDURE NOTE:  The patient request injection, verbal consent was obtained.  The right shoulder was prepped appropriately after time out was performed.   Sterile technique was observed and injection of 1 cc of DepoMedrol 40mg  with several cc's of plain xylocaine. Anesthesia was provided by ethyl chloride and a 20-gauge needle was used to inject the shoulder area. A posterior approach was used.  The injection was tolerated well.  A band aid dressing was applied.  The patient was advised to apply ice later today and tomorrow to the injection sight as needed.  Encounter Diagnosis  Name Primary?   Chronic right shoulder pain Yes   Return in one month.  Call if any problem.  Precautions discussed.  Electronically Signed , MD 8/8/20232:45 PM

## 2022-05-11 ENCOUNTER — Encounter: Payer: Self-pay | Admitting: *Deleted

## 2022-05-11 NOTE — Patient Instructions (Signed)
  Procedure: colonoscopy  Estimated body mass index is 25.25 kg/m as calculated from the following:   Height as of this encounter: 5' 10" (1.778 m).   Weight as of this encounter: 176 lb (79.8 kg).   Have you had a colonoscopy before?  Yes 05/03/17, [place]  Do you have family history of colon cancer  no  Do you have a family history of polyps? yes  Previous colonoscopy with polyps removed? yes  Do you have a history colorectal cancer?   no  Are you diabetic?  no  Do you have a prosthetic or mechanical heart valve? no  Do you have a pacemaker/defibrillator?   no  Have you had endocarditis/atrial fibrillation?  no  Do you use supplemental oxygen/CPAP?  no  Have you had joint replacement within the last 12 months?  no  Do you tend to be constipated or have to use laxatives?  yes   Do you have history of alcohol use? If yes, how much and how often.  no  Do you have history or are you using drugs? If yes, what do are you  using?  I smoke cannabis  Have you ever had a stroke/heart attack?  no  Have you ever had a heart or other vascular stent placed,?no  Do you take weight loss medication? no   Do you take any blood-thinning medications such as: (Plavix, aspirin, Coumadin, Aggrenox, Brilinta, Xarelto, Eliquis, Pradaxa, Savaysa or Effient) Aspirin 77m  If yes we need the name, milligram, dosage and who is prescribing doctor:               Current Outpatient Medications  Medication Sig Dispense Refill   albuterol (VENTOLIN HFA) 108 (90 Base) MCG/ACT inhaler Inhale 2 puffs into the lungs every 4 (four) hours as needed for wheezing or shortness of breath. 18 g 1   amLODipine (NORVASC) 5 MG tablet Take 1 tablet (5 mg total) by mouth daily. 90 tablet 3   aspirin EC 81 MG tablet Take 1 tablet (81 mg total) by mouth daily with breakfast. 30 tablet 2   gabapentin (NEURONTIN) 600 MG tablet Take 1 tablet (600 mg total) by mouth 3 (three) times daily. 90 tablet 3    ipratropium-albuterol (DUONEB) 0.5-2.5 (3) MG/3ML SOLN Take 3 mLs by nebulization every 6 (six) hours as needed (shortness of breath). 75 mL 2   linaclotide (LINZESS) 290 MCG CAPS capsule Take 1 capsule (290 mcg total) by mouth daily before breakfast. 90 capsule 3   metoprolol succinate (TOPROL-XL) 25 MG 24 hr tablet TAKE 2 TABLETS BY MOUTH IN THE MORNING AND 1 TABLET BY MOUTH IN THE EVENING. 270 tablet 3   naloxone (NARCAN) 4 MG/0.1ML LIQD nasal spray kit Place 1 spray into the nose as needed (as directed).     nitroGLYCERIN (NITROSTAT) 0.4 MG SL tablet Place 1 tablet (0.4 mg total) under the tongue every 5 (five) minutesas needed for chest pain. 25 tablet 3   oxyCODONE-acetaminophen (PERCOCET) 7.5-325 MG tablet Take 1 tablet by mouth 4 (four) times daily as needed for moderate pain.     temazepam (RESTORIL) 30 MG capsule Take 60 mg by mouth at bedtime as needed for sleep.     XTAMPZA ER 13.5 MG C12A Take 1 capsule by mouth 2 (two) times daily.     No current facility-administered medications for this visit.    Allergies  Allergen Reactions   Fentanyl Other (See Comments)    Ineffective

## 2022-05-14 NOTE — Progress Notes (Signed)
Reviewed Epic. Back surgery in 01/2022. Reported copd in the chart. Classified as ASA 2 at time of recent surgery.   Ok to schedule. ASA 3 (air on side of caution). He should continue Linzess including day before procedure. Add bisacodyl 10mg  po daily for 2 days before bowel prep.

## 2022-05-19 ENCOUNTER — Encounter: Payer: Self-pay | Admitting: *Deleted

## 2022-05-19 MED ORDER — PEG 3350-KCL-NA BICARB-NACL 420 G PO SOLR: 4000.0000 mL | Freq: Once | ORAL | 0 refills | Status: AC

## 2022-06-02 ENCOUNTER — Encounter: Payer: Self-pay | Admitting: Orthopaedic Surgery

## 2022-06-02 ENCOUNTER — Ambulatory Visit (INDEPENDENT_AMBULATORY_CARE_PROVIDER_SITE_OTHER): Payer: Medicare Other | Admitting: Orthopaedic Surgery

## 2022-06-02 DIAGNOSIS — F17210 Nicotine dependence, cigarettes, uncomplicated: Secondary | ICD-10-CM

## 2022-06-02 DIAGNOSIS — M25511 Pain in right shoulder: Secondary | ICD-10-CM

## 2022-06-02 DIAGNOSIS — G8929 Other chronic pain: Secondary | ICD-10-CM

## 2022-06-02 MED ORDER — METHYLPREDNISOLONE ACETATE 40 MG/ML IJ SUSP
40.0000 mg | Freq: Once | INTRAMUSCULAR | Status: AC
  Administered 2022-06-02: 40 mg via INTRA_ARTICULAR

## 2022-06-02 NOTE — Progress Notes (Signed)
PROCEDURE NOTE:  The patient request injection, verbal consent was obtained.  The right shoulder was prepped appropriately after time out was performed.   Sterile technique was observed and injection of 1 cc of DepoMedrol 40mg  with several cc's of plain xylocaine. Anesthesia was provided by ethyl chloride and a 20-gauge needle was used to inject the shoulder area. A posterior approach was used.  The injection was tolerated well.  A band aid dressing was applied.  The patient was advised to apply ice later today and tomorrow to the injection sight as needed.  Encounter Diagnoses  Name Primary?   Chronic right shoulder pain Yes   Cigarette nicotine dependence without complication    Return in one month.  Call if any problem.  Precautions discussed.  Electronically Signed , MD 9/5/20232:28 PM

## 2022-06-02 NOTE — Addendum Note (Signed)
Addended by: Recardo Evangelist A on: 06/02/2022 03:48 PM   Modules accepted: Orders

## 2022-06-19 ENCOUNTER — Other Ambulatory Visit: Payer: Self-pay

## 2022-06-19 ENCOUNTER — Encounter (HOSPITAL_COMMUNITY): Payer: Self-pay

## 2022-06-19 ENCOUNTER — Encounter (HOSPITAL_COMMUNITY)
Admission: RE | Admit: 2022-06-19 | Discharge: 2022-06-19 | Disposition: A | Discharge status: Home / Self Care | Payer: Medicare Other | Source: Ambulatory Visit | Attending: Internal Medicine | Admitting: Internal Medicine

## 2022-06-19 HISTORY — DX: Polyneuropathy, unspecified: G62.9

## 2022-06-19 NOTE — Pre-Procedure Instructions (Signed)
Attempted pre-op phone call. Left VM for him to call us back,

## 2022-06-24 ENCOUNTER — Ambulatory Visit (HOSPITAL_COMMUNITY)
Admission: RE | Admit: 2022-06-24 | Discharge: 2022-06-24 | Disposition: A | Discharge status: Home / Self Care | Payer: Medicare Other | Attending: Internal Medicine | Admitting: Internal Medicine

## 2022-06-24 ENCOUNTER — Ambulatory Visit (HOSPITAL_BASED_OUTPATIENT_CLINIC_OR_DEPARTMENT_OTHER): Payer: Medicare Other | Admitting: Certified Registered Nurse Anesthetist

## 2022-06-24 ENCOUNTER — Ambulatory Visit (HOSPITAL_COMMUNITY): Payer: Medicare Other | Admitting: Certified Registered Nurse Anesthetist

## 2022-06-24 ENCOUNTER — Encounter (HOSPITAL_COMMUNITY): Payer: Self-pay | Admitting: Internal Medicine

## 2022-06-24 ENCOUNTER — Encounter (HOSPITAL_COMMUNITY)
Admission: RE | Disposition: A | Discharge status: Home / Self Care | Payer: Self-pay | Source: Home / Self Care | Attending: Internal Medicine

## 2022-06-24 ENCOUNTER — Other Ambulatory Visit: Payer: Self-pay

## 2022-06-24 DIAGNOSIS — K635 Polyp of colon: Secondary | ICD-10-CM

## 2022-06-24 DIAGNOSIS — K649 Unspecified hemorrhoids: Secondary | ICD-10-CM | POA: Diagnosis not present

## 2022-06-24 DIAGNOSIS — Z8719 Personal history of other diseases of the digestive system: Secondary | ICD-10-CM | POA: Insufficient documentation

## 2022-06-24 DIAGNOSIS — Z09 Encounter for follow-up examination after completed treatment for conditions other than malignant neoplasm: Secondary | ICD-10-CM | POA: Diagnosis not present

## 2022-06-24 DIAGNOSIS — I10 Essential (primary) hypertension: Secondary | ICD-10-CM | POA: Diagnosis not present

## 2022-06-24 DIAGNOSIS — K5730 Diverticulosis of large intestine without perforation or abscess without bleeding: Secondary | ICD-10-CM | POA: Diagnosis not present

## 2022-06-24 DIAGNOSIS — J449 Chronic obstructive pulmonary disease, unspecified: Secondary | ICD-10-CM | POA: Diagnosis not present

## 2022-06-24 DIAGNOSIS — K640 First degree hemorrhoids: Secondary | ICD-10-CM | POA: Diagnosis not present

## 2022-06-24 DIAGNOSIS — Z9049 Acquired absence of other specified parts of digestive tract: Secondary | ICD-10-CM | POA: Insufficient documentation

## 2022-06-24 DIAGNOSIS — Z86010 Personal history of colonic polyps: Secondary | ICD-10-CM | POA: Diagnosis not present

## 2022-06-24 DIAGNOSIS — Z1211 Encounter for screening for malignant neoplasm of colon: Secondary | ICD-10-CM | POA: Insufficient documentation

## 2022-06-24 DIAGNOSIS — I2510 Atherosclerotic heart disease of native coronary artery without angina pectoris: Secondary | ICD-10-CM | POA: Diagnosis not present

## 2022-06-24 DIAGNOSIS — F17210 Nicotine dependence, cigarettes, uncomplicated: Secondary | ICD-10-CM | POA: Insufficient documentation

## 2022-06-24 DIAGNOSIS — K219 Gastro-esophageal reflux disease without esophagitis: Secondary | ICD-10-CM | POA: Diagnosis not present

## 2022-06-24 HISTORY — PX: POLYPECTOMY: SHX5525

## 2022-06-24 HISTORY — PX: COLONOSCOPY WITH PROPOFOL: SHX5780

## 2022-06-24 SURGERY — COLONOSCOPY WITH PROPOFOL
Anesthesia: General

## 2022-06-24 MED ORDER — PHENYLEPHRINE HCL (PRESSORS) 10 MG/ML IV SOLN
INTRAVENOUS | Status: DC | PRN
  Administered 2022-06-24: 160 ug via INTRAVENOUS
  Administered 2022-06-24: 80 ug via INTRAVENOUS

## 2022-06-24 MED ORDER — LACTATED RINGERS IV SOLN: INTRAVENOUS | Status: DC | PRN

## 2022-06-24 MED ORDER — PROPOFOL 500 MG/50ML IV EMUL
INTRAVENOUS | Status: AC
  Filled 2022-06-24: qty 50

## 2022-06-24 MED ORDER — PROPOFOL 10 MG/ML IV BOLUS
INTRAVENOUS | Status: DC | PRN
  Administered 2022-06-24: 100 mg via INTRAVENOUS

## 2022-06-24 MED ORDER — PROPOFOL 500 MG/50ML IV EMUL
INTRAVENOUS | Status: DC | PRN
  Administered 2022-06-24: 300 ug/kg/min via INTRAVENOUS

## 2022-06-24 MED ORDER — LACTATED RINGERS IV SOLN: INTRAVENOUS | Status: DC

## 2022-06-24 NOTE — H&P (Signed)
$'@LOGO'y$ @   Primary Care Physician:  Abran Richard, MD Primary Gastroenterologist:  Dr. Gala Romney  Pre-Procedure History & Physical: HPI:  Peter Campbell is a 66 y.o. male here for  a surveillance colonoscopy.  Distant history of colonic polyps status post left hemicolectomy for colon mass number of decades ago.  Currently, no bowel symptoms.  Benign polyps removed 5 years ago.  Past Medical History:  Diagnosis Date   Arthritis    Chronic back pain    Collagen vascular disease ([place])    Colonic mass    History, s/p removal; per patient.   Constipation    COPD (chronic obstructive pulmonary disease) (HCC)    Coronary atherosclerosis of native coronary artery    Dense coronary atherosclerosis by chest CT November 2014    Dyspnea    when walking distance   Essential hypertension    Essential tremor    History of chicken pox    History of German measles    Neuropathy    Pneumonia    2015 ish   Sciatica     Past Surgical History:  Procedure Laterality Date   CIRCUMCISION     COLON SURGERY  '80's   for mass removal, colonostomy   COLONOSCOPY WITH PROPOFOL N/A 05/03/2017   Two 3-5 mm polyps in rectum and descending colon s/p removal, pancolonic diverticulosis, internal hemorrhoids. Benign. Surveillance due in 5 years.    colonostomy reversal  80's   LUMBAR FUSION  11/27/2019   Lumber discectomy     Middle finger Right    fracture   NASAL SEPTUM SURGERY     POLYPECTOMY  05/03/2017   Procedure: POLYPECTOMY;  Surgeon: Daneil Dolin, MD;  Location: [facility];  Service: Endoscopy;;  descending colon and rectal   SHOULDER ACROMIOPLASTY Right 06/12/2014   Procedure: RIGHT SHOULDER ACROMIOPLASTY;  Surgeon: [name], MD;  Location: [facility];  Service: Orthopedics;  Laterality: Right;   SHOULDER OPEN ROTATOR CUFF REPAIR Right 06/12/2014   Procedure: OPEN REPAIR ROTATOR CUFF RIGHT ;  Surgeon: [name], MD;  Location: [facility];  Service: Orthopedics;  Laterality: Right;   TONSILLECTOMY      TYMPANOSTOMY TUBE PLACEMENT      Prior to Admission medications   Medication Sig Start Date End Date Taking? Authorizing Provider  albuterol (VENTOLIN HFA) 108 (90 Base) MCG/ACT inhaler Inhale 2 puffs into the lungs every 4 (four) hours as needed for wheezing or shortness of breath. 01/31/22  Yes Emokpae, Courage, MD  amLODipine (NORVASC) 5 MG tablet Take 1 tablet (5 mg total) by mouth daily. 03/13/22  Yes Strader, Fransisco Hertz, PA-C  aspirin EC 81 MG tablet Take 1 tablet (81 mg total) by mouth daily with breakfast. 01/31/22 01/31/23 Yes Emokpae, Courage, MD  betamethasone dipropionate 0.05 % cream Apply 1 Application topically 2 (two) times daily.   Yes [provider]  gabapentin (NEURONTIN) 600 MG tablet Take 1 tablet (600 mg total) by mouth 3 (three) times daily. Patient taking differently: Take 600 mg by mouth in the morning, at noon, in the evening, and at bedtime. 01/31/22  Yes Emokpae, Courage, MD  Glycopyrrolate-Formoterol (BEVESPI AEROSPHERE) 9-4.8 MCG/ACT AERO Inhale 2 puffs into the lungs 2 (two) times daily.   Yes [provider]  ipratropium-albuterol (DUONEB) 0.5-2.5 (3) MG/3ML SOLN Take 3 mLs by nebulization every 6 (six) hours as needed (shortness of breath). 01/31/22  Yes Roxan Hockey, MD  linaclotide (LINZESS) 290 MCG CAPS capsule Take 1 capsule (290 mcg total) by mouth  daily before breakfast. 03/19/22  Yes Jhanvi Drakeford, Cristopher Estimable, MD  metoprolol succinate (TOPROL-XL) 25 MG 24 hr tablet TAKE 2 TABLETS BY MOUTH IN THE MORNING AND 1 TABLET BY MOUTH IN THE EVENING. 03/13/22  Yes Strader, [place] M, PA-C  naloxone [facility]) 4 MG/0.1ML LIQD nasal spray kit Place 1 spray into the nose as needed (as directed).   Yes [provider]  nitroGLYCERIN (NITROSTAT) 0.4 MG SL tablet Place 1 tablet (0.4 mg total) under the tongue every 5 (five) minutesas needed for chest pain. 03/07/20  Yes Satira Sark, MD  oxyCODONE-acetaminophen (PERCOCET) 7.5-325 MG tablet Take 1 tablet by  mouth 4 (four) times daily as needed for moderate pain. 02/16/20  Yes [provider]  temazepam (RESTORIL) 30 MG capsule Take 60 mg by mouth at bedtime.   Yes [provider]  XTAMPZA ER 13.5 MG C12A Take 13.5 mg by mouth 2 (two) times daily. 04/03/20  Yes [provider]    Allergies as of 05/19/2022 - Review Complete 05/05/2022  Allergen Reaction Noted   Fentanyl Other (See Comments) 05/16/2019    Family History  Problem Relation Age of Onset   COPD Father    Cancer Father    Cancer Mother    Heart attack Brother    Epilepsy Sister    Hypertension Sister    Colon cancer Neg Hx     Social History   Socioeconomic History   Marital status: Widowed    Spouse name: Not on file   Number of children: Not on file   Years of education: Not on file   Highest education level: Not on file  Occupational History   Occupation: Disabled  Tobacco Use   Smoking status: Every Day    Packs/day: 1.00    Years: 30.00    Total pack years: 30.00    Types: Cigarettes    Start date: 08/28/1969   Smokeless tobacco: Never  Vaping Use   Vaping Use: Never used  Substance and Sexual Activity   Alcohol use: No    Alcohol/week: 0.0 standard drinks of alcohol   Drug use: Yes    Types: Marijuana, Other-see comments    Comment: Pt uses Xanax- prn   Sexual activity: Not Currently  Other Topics Concern   Not on file  Social History Narrative   Pt lives alone in [place]; on disability   Social Determinants of Health   Financial Resource Strain: Not on file  Food Insecurity: Not on file  Transportation Needs: Not on file  Physical Activity: Not on file  Stress: Not on file  Social Connections: Not on file  Intimate Partner Violence: Not on file    Review of Systems: See HPI, otherwise negative ROS  Physical Exam: BP 120/66   Pulse 75   Temp 98.7 F (37.1 C) (Oral)   Resp 13   Wt 79.8 kg   SpO2 97%   BMI 25.25 kg/m  General:   Alert,  Well-developed,  well-nourished, pleasant and cooperative in NAD Neck:  Supple; no masses or thyromegaly. No significant cervical adenopathy. Lungs:  Clear throughout to auscultation.   No wheezes, crackles, or rhonchi. No acute distress. Heart:  Regular rate and rhythm; no murmurs, clicks, rubs,  or gallops. Abdomen: Non-distended, normal bowel sounds.  Soft and nontender without appreciable mass or hepatosplenomegaly.  Pulses:  Normal pulses noted. Extremities:  Without clubbing or edema.  Impression/Plan:    66 year old gentleman with a history of colonic polyps here for surveillance colonoscopy.  I have offered him a surveillance colonoscopy today per plan.  The risks, benefits, limitations, alternatives and imponderables have been reviewed with the patient. Questions have been answered. All parties are agreeable.       Notice: This dictation was prepared with Dragon dictation along with smaller phrase technology. Any transcriptional errors that result from this process are unintentional and may not be corrected upon review.

## 2022-06-24 NOTE — Op Note (Signed)
[facility] Patient Name: Peter Campbell Procedure Date: 06/24/2022 1:10 PM MRN: [id number] Date of Birth: 17-Apr-1956 Attending MD: Norvel Richards , MD CSN: [id number] Age: 66 Admit Type: Outpatient Procedure:                Colonoscopy Indications:              High risk colon cancer surveillance: Personal                            history of colonic polyps Providers:                Norvel Richards, MD, Rosina Lowenstein, RN,                            Everardo Pacific Referring MD:              Medicines:                Propofol per Anesthesia Complications:            No immediate complications. Estimated Blood Loss:     Estimated blood loss was minimal. Procedure:                Pre-Anesthesia Assessment:                           - Prior to the procedure, a History and Physical                            was performed, and patient medications and                            allergies were reviewed. The patient's tolerance of                            previous anesthesia was also reviewed. The risks                            and benefits of the procedure and the sedation                            options and risks were discussed with the patient.                            All questions were answered, and informed consent                            was obtained. Prior Anticoagulants: The patient has                            taken no previous anticoagulant or antiplatelet                            agents. ASA Grade Assessment: III - A patient with  severe systemic disease. After reviewing the risks                            and benefits, the patient was deemed in                            satisfactory condition to undergo the procedure.                           After obtaining informed consent, the colonoscope                            was passed under direct vision. Throughout the                            procedure, the patient's blood  pressure, pulse, and                            oxygen saturations were monitored continuously. The                            [id number]) scope was introduced through the                            anus and advanced to the the cecum, identified by                            appendiceal orifice and ileocecal valve. The                            colonoscopy was performed without difficulty. The                            patient tolerated the procedure well. The quality                            of the bowel preparation was adequate. Scope In: 1:30:34 PM Scope Out: 1:44:02 PM Scope Withdrawal Time: 0 hours 8 minutes 53 seconds  Total Procedure Duration: 0 hours 13 minutes 28 seconds  Findings:      The perianal and digital rectal examinations were normal.      Non-bleeding internal hemorrhoids were found during retroflexion. The       hemorrhoids were moderate, medium-sized and Grade I (internal       hemorrhoids that do not prolapse).      Scattered small-mouthed diverticula were found in the entire colon.      Three sessile polyps were found in the splenic flexure. The polyps were       3 to 5 mm in size. These polyps were removed with a cold snare.       Resection and retrieval were complete. Estimated blood loss was minimal.      The exam was otherwise without abnormality on direct and retroflexion       views. Site of prior surgery/anastomosis not apparent today. Impression:               -  Non-bleeding internal hemorrhoids.                           - Diverticulosis in the entire examined colon.                           - Three 3 to 5 mm polyps at the splenic flexure,                            removed with a cold snare. Resected and retrieved.                           - The examination was otherwise normal on direct                            and retroflexion views. Moderate Sedation:      Moderate (conscious) sedation was personally administered by an       anesthesia  professional. The following parameters were monitored: oxygen       saturation, heart rate, blood pressure, and response to care. Recommendation:           - Patient has a contact number available for                            emergencies. The signs and symptoms of potential                            delayed complications were discussed with the                            patient. Return to normal activities tomorrow.                            Written discharge instructions were provided to the                            patient.                           - Resume previous diet.                           - Continue present medications.                           - Await pathology results.                           - Repeat colonoscopy date to be determined after                            pending pathology results are reviewed for                            surveillance.                           -  Return to GI office (date not yet determined). Procedure Code(s):        --- Professional ---                           [id number], Colonoscopy, flexible; with removal of                            tumor(s), polyp(s), or other lesion(s) by snare                            technique Diagnosis Code(s):        --- Professional ---                           Z86.010, Personal history of colonic polyps                           K64.0, First degree hemorrhoids                           K63.5, Polyp of colon                           K57.30, Diverticulosis of large intestine without                            perforation or abscess without bleeding CPT copyright 2019 American Medical Association. All rights reserved. The codes documented in this report are preliminary and upon coder review may  be revised to meet current compliance requirements. Cristopher Estimable. Herb Beltre, MD Norvel Richards, MD 06/24/2022 1:50:21 PM This report has been signed electronically. Number of Addenda: 0

## 2022-06-24 NOTE — Anesthesia Preprocedure Evaluation (Signed)
Anesthesia Evaluation  Patient identified by MRN, date of birth, ID band Patient awake    Reviewed: Allergy & Precautions, H&P , NPO status , Patient's Chart, lab work & pertinent test results, reviewed documented beta blocker date and time   Airway Mallampati: II  TM Distance: >3 FB Neck ROM: full    Dental no notable dental hx.    Pulmonary shortness of breath, COPD, Current Smoker,    Pulmonary exam normal breath sounds clear to auscultation       Cardiovascular Exercise Tolerance: Good hypertension, + CAD   Rhythm:regular Rate:Normal     Neuro/Psych  Neuromuscular disease negative psych ROS   GI/Hepatic Neg liver ROS, GERD  Medicated,  Endo/Other  negative endocrine ROS  Renal/GU negative Renal ROS  negative genitourinary   Musculoskeletal   Abdominal   Peds  Hematology negative hematology ROS (+)   Anesthesia Other Findings   Reproductive/Obstetrics negative OB ROS                             Anesthesia Physical Anesthesia Plan  ASA: 2  Anesthesia Plan: General   Post-op Pain Management:    Induction:   PONV Risk Score and Plan: Propofol infusion  Airway Management Planned:   Additional Equipment:   Intra-op Plan:   Post-operative Plan:   Informed Consent: I have reviewed the patients History and Physical, chart, labs and discussed the procedure including the risks, benefits and alternatives for the proposed anesthesia with the patient or authorized representative who has indicated his/her understanding and acceptance.     Dental Advisory Given  Plan Discussed with: CRNA  Anesthesia Plan Comments:         Anesthesia Quick Evaluation

## 2022-06-24 NOTE — Transfer of Care (Signed)
Immediate Anesthesia Transfer of Care Note  Patient: Sheron Nightingale  Procedure(s) Performed: COLONOSCOPY WITH PROPOFOL POLYPECTOMY  Patient Location: PACU  Anesthesia Type:General  Level of Consciousness: awake, alert  and oriented  Airway & Oxygen Therapy: Patient Spontanous Breathing  Post-op Assessment: Report given to RN, Post -op Vital signs reviewed and stable, Patient moving all extremities X 4 and Patient able to stick tongue midline  Post vital signs: Reviewed  Last Vitals:  Vitals Value Taken Time  BP 123/50   Temp 36.8 C 06/24/22 1349  Pulse 73 06/24/22 1349  Resp 14 06/24/22 1349  SpO2 99 % 06/24/22 1349    Last Pain:  Vitals:   06/24/22 1349  TempSrc: Oral  PainSc: 5       Patients Stated Pain Goal: 7 (39/76/73 [id number])  Complications: No notable events documented.

## 2022-06-24 NOTE — Discharge Instructions (Signed)
  Colonoscopy Discharge Instructions  Read the instructions outlined below and refer to this sheet in the next few weeks. These discharge instructions provide you with general information on caring for yourself after you leave the hospital. Your doctor may also give you specific instructions. While your treatment has been planned according to the most current medical practices available, unavoidable complications occasionally occur. If you have any problems or questions after discharge, call Dr. Gala Romney at [phone]. ACTIVITY You may resume your regular activity, but move at a slower pace for the next 24 hours.  Take frequent rest periods for the next 24 hours.  Walking will help get rid of the air and reduce the bloated feeling in your belly (abdomen).  No driving for 24 hours (because of the medicine (anesthesia) used during the test).   Do not sign any important legal documents or operate any machinery for 24 hours (because of the anesthesia used during the test).  NUTRITION Drink plenty of fluids.  You may resume your normal diet as instructed by your doctor.  Begin with a light meal and progress to your normal diet. Heavy or fried foods are harder to digest and may make you feel sick to your stomach (nauseated).  Avoid alcoholic beverages for 24 hours or as instructed.  MEDICATIONS You may resume your normal medications unless your doctor tells you otherwise.  WHAT YOU CAN EXPECT TODAY Some feelings of bloating in the abdomen.  Passage of more gas than usual.  Spotting of blood in your stool or on the toilet paper.  IF YOU HAD POLYPS REMOVED DURING THE COLONOSCOPY: No aspirin products for 7 days or as instructed.  No alcohol for 7 days or as instructed.  Eat a soft diet for the next 24 hours.  FINDING OUT THE RESULTS OF YOUR TEST Not all test results are available during your visit. If your test results are not back during the visit, make an appointment with your caregiver to find out the  results. Do not assume everything is normal if you have not heard from your caregiver or the medical facility. It is important for you to follow up on all of your test results.  SEEK IMMEDIATE MEDICAL ATTENTION IF: You have more than a spotting of blood in your stool.  Your belly is swollen (abdominal distention).  You are nauseated or vomiting.  You have a temperature over 101.  You have abdominal pain or discomfort that is severe or gets worse throughout the day.       3 polyps removed from your colon.  Colon diverticulosis and polyp information provided  Further recommendations to follow pending review of pathology report

## 2022-06-26 NOTE — Anesthesia Postprocedure Evaluation (Signed)
Anesthesia Post Note  Patient: Peter Campbell  Procedure(s) Performed: COLONOSCOPY WITH PROPOFOL POLYPECTOMY  Patient location during evaluation: Phase II Anesthesia Type: General Level of consciousness: awake Pain management: pain level controlled Vital Signs Assessment: post-procedure vital signs reviewed and stable Respiratory status: spontaneous breathing and respiratory function stable Cardiovascular status: blood pressure returned to baseline and stable Postop Assessment: no headache and no apparent nausea or vomiting Anesthetic complications: no Comments: Late entry   No notable events documented.   Last Vitals:  Vitals:   06/24/22 1349 06/24/22 1357  BP:  (!) 89/58  Pulse: 73   Resp: 14   Temp: 36.8 C   SpO2: 99%     Last Pain:  Vitals:   06/24/22 1349  TempSrc: Oral  PainSc: [place]

## 2022-06-29 LAB — SURGICAL PATHOLOGY

## 2022-06-30 ENCOUNTER — Encounter: Payer: Self-pay | Admitting: Orthopaedic Surgery

## 2022-06-30 ENCOUNTER — Ambulatory Visit (INDEPENDENT_AMBULATORY_CARE_PROVIDER_SITE_OTHER): Payer: Medicare Other | Admitting: Orthopaedic Surgery

## 2022-06-30 DIAGNOSIS — G8929 Other chronic pain: Secondary | ICD-10-CM

## 2022-06-30 DIAGNOSIS — M25511 Pain in right shoulder: Secondary | ICD-10-CM | POA: Diagnosis not present

## 2022-06-30 MED ORDER — METHYLPREDNISOLONE ACETATE 40 MG/ML IJ SUSP
40.0000 mg | Freq: Once | INTRAMUSCULAR | Status: AC
  Administered 2022-06-30: 40 mg via INTRA_ARTICULAR

## 2022-06-30 NOTE — Progress Notes (Signed)
PROCEDURE NOTE:  The patient request injection, verbal consent was obtained.  The right shoulder was prepped appropriately after time out was performed.   Sterile technique was observed and injection of 1 cc of DepoMedrol 40mg  with several cc's of plain xylocaine. Anesthesia was provided by ethyl chloride and a 20-gauge needle was used to inject the shoulder area. A posterior approach was used.  The injection was tolerated well.  A band aid dressing was applied.  The patient was advised to apply ice later today and tomorrow to the injection sight as needed.  Encounter Diagnosis  Name Primary?   Chronic right shoulder pain Yes   Return in one month.  Call if any problem.  Precautions discussed.  Electronically Signed [name], MD 10/3/20231:56 PM

## 2022-07-01 ENCOUNTER — Encounter: Payer: Self-pay | Admitting: Internal Medicine

## 2022-07-01 ENCOUNTER — Encounter (HOSPITAL_COMMUNITY): Payer: Self-pay | Admitting: Internal Medicine

## 2022-07-09 ENCOUNTER — Telehealth: Payer: Self-pay | Admitting: Cardiology

## 2022-07-09 MED ORDER — NITROGLYCERIN 0.4 MG SL SUBL: SUBLINGUAL_TABLET | SUBLINGUAL | 3 refills | Status: DC

## 2022-07-09 NOTE — Telephone Encounter (Signed)
*  STAT* If patient is at the pharmacy, call can be transferred to refill team.   1. Which medications need to be refilled? (please list name of each medication and dose if known)  nitroGLYCERIN (NITROSTAT) 0.4 MG SL tablet  2. Which pharmacy/location (including street and city if local pharmacy) is medication to be sent to? [place], [place]  3. Do they need a 30 day or 90 day supply? [place]

## 2022-07-09 NOTE — Telephone Encounter (Signed)
Refiled  as requested

## 2022-07-28 ENCOUNTER — Encounter: Payer: Self-pay | Admitting: Orthopaedic Surgery

## 2022-07-28 ENCOUNTER — Ambulatory Visit (INDEPENDENT_AMBULATORY_CARE_PROVIDER_SITE_OTHER): Payer: Medicare Other | Admitting: Orthopaedic Surgery

## 2022-07-28 DIAGNOSIS — M25511 Pain in right shoulder: Secondary | ICD-10-CM | POA: Diagnosis not present

## 2022-07-28 DIAGNOSIS — G8929 Other chronic pain: Secondary | ICD-10-CM

## 2022-07-28 MED ORDER — METHYLPREDNISOLONE ACETATE 40 MG/ML IJ SUSP
40.0000 mg | Freq: Once | INTRAMUSCULAR | Status: AC
  Administered 2022-07-28: 40 mg via INTRA_ARTICULAR

## 2022-07-28 NOTE — Progress Notes (Signed)
PROCEDURE NOTE:  The patient request injection, verbal consent was obtained.  The right shoulder was prepped appropriately after time out was performed.   Sterile technique was observed and injection of 1 cc of DepoMedrol 40mg  with several cc's of plain xylocaine. Anesthesia was provided by ethyl chloride and a 20-gauge needle was used to inject the shoulder area. A posterior approach was used.  The injection was tolerated well.  A band aid dressing was applied.  The patient was advised to apply ice later today and tomorrow to the injection sight as needed.  Encounter Diagnosis  Name Primary?   Chronic right shoulder pain Yes   Return in one month.  Call if any problem.  Precautions discussed.  Electronically Signed [name], MD 10/31/20232:39 PM

## 2022-08-25 ENCOUNTER — Ambulatory Visit (INDEPENDENT_AMBULATORY_CARE_PROVIDER_SITE_OTHER): Payer: Medicare Other | Admitting: Orthopaedic Surgery

## 2022-08-25 ENCOUNTER — Encounter: Payer: Self-pay | Admitting: Orthopaedic Surgery

## 2022-08-25 DIAGNOSIS — G8929 Other chronic pain: Secondary | ICD-10-CM | POA: Diagnosis not present

## 2022-08-25 DIAGNOSIS — M25511 Pain in right shoulder: Secondary | ICD-10-CM | POA: Diagnosis not present

## 2022-08-25 MED ORDER — METHYLPREDNISOLONE ACETATE 40 MG/ML IJ SUSP
40.0000 mg | Freq: Once | INTRAMUSCULAR | Status: AC
  Administered 2022-08-25: 40 mg via INTRA_ARTICULAR

## 2022-08-25 NOTE — Progress Notes (Signed)
PROCEDURE NOTE:  The patient request injection, verbal consent was obtained.  The right shoulder was prepped appropriately after time out was performed.   Sterile technique was observed and injection of 1 cc of DepoMedrol 40mg  with several cc's of plain xylocaine. Anesthesia was provided by ethyl chloride and a 20-gauge needle was used to inject the shoulder area. A posterior approach was used.  The injection was tolerated well.  A band aid dressing was applied.  The patient was advised to apply ice later today and tomorrow to the injection sight as needed.  Encounter Diagnosis  Name Primary?   Chronic right shoulder pain Yes   Return in five weeks.  Call if any problem.  Precautions discussed.  Electronically Signed , MD 11/28/20232:02 PM

## 2022-09-14 ENCOUNTER — Ambulatory Visit (INDEPENDENT_AMBULATORY_CARE_PROVIDER_SITE_OTHER): Payer: Medicare Other | Admitting: Internal Medicine

## 2022-09-14 ENCOUNTER — Encounter: Payer: Self-pay | Admitting: Internal Medicine

## 2022-09-14 VITALS — BP 122/62 | HR 78 | Temp 98.7°F | Ht 70.0 in | Wt 177.4 lb

## 2022-09-14 DIAGNOSIS — F17210 Nicotine dependence, cigarettes, uncomplicated: Secondary | ICD-10-CM

## 2022-09-14 DIAGNOSIS — J449 Chronic obstructive pulmonary disease, unspecified: Secondary | ICD-10-CM

## 2022-09-14 DIAGNOSIS — I2510 Atherosclerotic heart disease of native coronary artery without angina pectoris: Secondary | ICD-10-CM | POA: Diagnosis not present

## 2022-09-14 DIAGNOSIS — J441 Chronic obstructive pulmonary disease with (acute) exacerbation: Secondary | ICD-10-CM | POA: Diagnosis not present

## 2022-09-14 MED ORDER — BREZTRI AEROSPHERE 160-9-4.8 MCG/ACT IN AERO: 2.0000 | INHALATION_SPRAY | Freq: Two times a day (BID) | RESPIRATORY_TRACT | 11 refills | Status: DC

## 2022-09-14 NOTE — Progress Notes (Signed)
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       History of Present Illness  09/14/2022  Pulmonary/ 1st office eval/ S[facility] Potvin / [facility]  Chief Complaint  Patient presents with   Consult    SOB/ COPD   Dyspnea:  onset x 2018 gradually worse since then to point at ov better since rx bevespi limited back and breathing [employer] half the store lower pace = MMRC3 = can't walk 100 yards even at a slow pace at a flat grade s stopping due to sob   Cough: rattling cough in am / Sleep: flat bed / one pillow at least once a week wakes up and needs albuterol  SABA use: much less on bevespi 02: none  Lung cancer screen: referred at [facility] 09/14/2022   No obvious day to day or daytime pattern/variability or assoc excess/ purulent sputum or mucus plugs or hemoptysis or cp or chest tightness, subjective wheeze or overt sinus or hb symptoms.    Also denies any obvious fluctuation of symptoms with weather or environmental changes or other aggravating or alleviating factors except as outlined above   No unusual exposure hx or h/o childhood pna/ asthma or knowledge of premature birth.  Current Allergies, Complete Past Medical History, Past Surgical History, Family History, and Social History were reviewed in [employer] record.  ROS  The following are not active complaints unless bolded Hoarseness, sore throat, dysphagia, dental problems, itching, sneezing,  nasal congestion or discharge of excess mucus or purulent secretions, ear ache,   fever, chills, sweats, unintended wt loss or wt gain, classically pleuritic or exertional cp,  orthopnea pnd or arm/hand swelling  or leg swelling, presyncope, palpitations, abdominal pain, anorexia, nausea, vomiting, diarrhea  or change in bowel habits or change in bladder  habits, change in stools or change in urine, dysuria, hematuria,  rash, arthralgias, visual complaints, headache, numbness, weakness or ataxia or problems with walking or coordination,  change in mood or  memory.              Past Medical History:  Diagnosis Date   Arthritis    Chronic back pain    Collagen vascular disease ([place])    Colonic mass    History, s/p removal; per patient.   Constipation    COPD (chronic obstructive pulmonary disease) (HCC)    Coronary atherosclerosis of native coronary artery    Dense coronary atherosclerosis by chest CT November 2014    Dyspnea    when walking distance   Essential hypertension    Essential tremor    History of chicken pox    History of [place] measles    Neuropathy    Pneumonia    2015 ish   Sciatica     Outpatient Medications Prior to Visit  Medication Sig Dispense Refill   albuterol (VENTOLIN HFA) 108 (90 Base) MCG/ACT inhaler Inhale 2 puffs into the lungs every 4 (four) hours as needed for wheezing or shortness of breath. 18 g 1   amLODipine (NORVASC) 5 MG tablet Take 1 tablet (5 mg total) by mouth daily. 90 tablet 3   aspirin EC 81 MG tablet Take 1 tablet (81 mg total) by mouth daily with breakfast. 30 tablet 2   betamethasone dipropionate 0.05 % cream Apply  1 Application topically 2 (two) times daily.     gabapentin (NEURONTIN) 600 MG tablet Take 1 tablet (600 mg total) by mouth 3 (three) times daily. (Patient taking differently: Take 600 mg by mouth in the morning, at noon, in the evening, and at bedtime.) 90 tablet 3   Glycopyrrolate-Formoterol (BEVESPI AEROSPHERE) 9-4.8 MCG/ACT AERO Inhale 2 puffs into the lungs 2 (two) times daily.     ipratropium-albuterol (DUONEB) 0.5-2.5 (3) MG/3ML SOLN Take 3 mLs by nebulization every 6 (six) hours as needed (shortness of breath). 75 mL 2   linaclotide (LINZESS) 290 MCG CAPS capsule Take 1 capsule (290 mcg total) by mouth daily before breakfast. 90 capsule 3   metoprolol succinate  (TOPROL-XL) 25 MG 24 hr tablet TAKE 2 TABLETS BY MOUTH IN THE MORNING AND 1 TABLET BY MOUTH IN THE EVENING. 270 tablet 3   naloxone (NARCAN) 4 MG/0.1ML LIQD nasal spray kit Place 1 spray into the nose as needed (as directed).     nitroGLYCERIN (NITROSTAT) 0.4 MG SL tablet Place 1 tablet (0.4 mg total) under the tongue every 5 (five) minutesas needed for chest pain. 25 tablet 3   oxyCODONE-acetaminophen (PERCOCET) 7.5-325 MG tablet Take 1 tablet by mouth 4 (four) times daily as needed for moderate pain.     temazepam (RESTORIL) 30 MG capsule Take 60 mg by mouth at bedtime.     XTAMPZA ER 13.5 MG C12A Take 13.5 mg by mouth 2 (two) times daily.     No facility-administered medications prior to visit.     Objective:     BP 122/62   Pulse 78   Temp 98.7 F (37.1 C)   Ht _0  (1.778 m)   Wt 177 lb 6.4 oz (80.5 kg)   SpO2 94% Comment: ra  BMI 25.45 kg/m   SpO2: 94 % (ra)   HEENT : Oropharynx  clear/ top denture/ lower partial   Nasal turbinates nl    NECK :  without  apparent JVD/ palpable Nodes/TM    LUNGS: no acc muscle use,  Mild/mod  barrel  contour chest wall with bilateral  Distant late exp wheeze and  without cough on insp or exp maneuvers  and mild  Hyperresonant  to  percussion bilaterally     CV:  RRR  no s3 or murmur or increase in P2, and no edema   ABD:  soft and nontender with pos end  insp Hoover's  in the supine position.  No bruits or organomegaly appreciated   MS:  Nl gait/ ext warm without deformities Or obvious joint restrictions  calf tenderness, cyanosis or clubbing     SKIN: warm and dry without lesions    NEURO:  alert, approp, nl sensorium with  no motor or cerebellar deficits apparent.      I personally reviewed images and agree with radiology impression as follows:   Chest CTa   10/02/21 Respiratory motion severely limits evaluation. Stable emphysema. No gross consolidation. There may be areas of bronchial thickening in both lungs. Chronic  scarring in the right lung appears stable. Stable nodularity at the right apex. More prominent atelectasis in the inferior lingula.  Labs ordered 09/14/2022  :  allergy screen   alpha one AT phenotype      Assessment   COPD GOLD ? / group E Active smoker - Labs ordered 09/14/2022  :  allergy screen  alpha one AT phenotype   - 09/14/2022   Walked on RA  x  3  lap(s) =  approx 450  ft  @ mod fast pace, stopped due to end of study with lowest 02 sats 93% mild sob last lap  - 09/14/2022  After extensive coaching inhaler device,  effectiveness =    80%    Group D (now reclassified as E) in terms of symptom/risk and laba/lama/ICS  therefore appropriate rx at this point >>>  breztri and approp saba trial     Cigarette smoker 4-5 min discussion re active cigarette smoking in addition to office E&M  Ask about tobacco use:   ongoing Advise quitting   I took an extended  opportunity with this patient to outline the consequences of continued cigarette use  in airway disorders based on all the data we have from the multiple national lung health studies (perfomed over decades at millions of dollars in cost)  indicating that smoking cessation, not choice of inhalers or physicians, is the most important aspect of his care.   Assess willingness:  Not committed at this point Assist in quit attempt:  Per PCP when ready Arrange follow up:   Follow up per Primary Care planned  For smoking cessation classes call [phone]       Low-dose CT lung cancer screening is recommended for patients who are 90-44 years of age with a 20+ pack-year history of smoking and who are currently smoking or quit <=15 years ago. No coughing up blood  No unintentional weight loss of > 15 pounds in the last 6 months - pt is eligible for scanning yearly until age 25 > referred         Each maintenance medication was reviewed in detail including emphasizing most importantly the difference between maintenance and prns and  under what circumstances the prns are to be triggered using an action plan format where appropriate.  Total time for H and P, chart review, counseling, reviewing hfa/neb device(s) , directly observing portions of ambulatory 02 saturation study/ and generating customized AVS unique to this office visit / same day charting = 30 min new pt eval                   Christinia Gully, MD 09/14/2022

## 2022-09-14 NOTE — Assessment & Plan Note (Addendum)
4-5 min discussion re active cigarette smoking in addition to office E&M  Ask about tobacco use:   ongoing Advise quitting   I took an extended  opportunity with this patient to outline the consequences of continued cigarette use  in airway disorders based on all the data we have from the multiple national lung health studies (perfomed over decades at millions of dollars in cost)  indicating that smoking cessation, not choice of inhalers or physicians, is the most important aspect of his care.   Assess willingness:  Not committed at this point Assist in quit attempt:  Per PCP when ready Arrange follow up:   Follow up per Primary Care planned  For smoking cessation classes call [phone]       Low-dose CT lung cancer screening is recommended for patients who are 56-71 years of age with a 20+ pack-year history of smoking and who are currently smoking or quit <=15 years ago. No coughing up blood  No unintentional weight loss of > 15 pounds in the last 6 months - pt is eligible for scanning yearly until age 37 > referred         Each maintenance medication was reviewed in detail including emphasizing most importantly the difference between maintenance and prns and under what circumstances the prns are to be triggered using an action plan format where appropriate.  Total time for H and P, chart review, counseling, reviewing hfa/neb device(s) , directly observing portions of ambulatory 02 saturation study/ and generating customized AVS unique to this office visit / same day charting = 30 min new pt eval

## 2022-09-14 NOTE — Patient Instructions (Signed)
Plan A = Automatic = Always=    Breztri or Bevesp Take 2 puffs first thing in am and then another 2 puffs about 12 hours later.    Work on inhaler technique:  relax and gently blow all the way out then take a nice smooth full deep breath back in, triggering the inhaler at same time you start breathing in.  Hold breath in for at least  5 seconds if you can. Blow out breztri  thru nose. Rinse and gargle with water when done.  If mouth or throat bother you at all,  try brushing teeth/gums/tongue with arm and hammer toothpaste/ make a slurry and gargle and spit out.   Plan B = Backup (to supplement plan A, not to replace it) Only use your albuterol inhaler as a rescue medication to be used if you can't catch your breath by resting or doing a relaxed purse lip breathing pattern.  - The less you use it, the better it will work when you need it. - Ok to use the inhaler up to 2 puffs  every 4 hours if you must but call for appointment if use goes up over your usual need - Don't leave home without it !!  (think of it like the spare tire for your car)   Plan C = Crisis (instead of Plan B but only if Plan B stops working) - only use your albuterol nebulizer if you first try Plan B and it fails to help > ok to use the nebulizer up to every 4 hours but if start needing it regularly call for immediate appointment   Please remember to go to the lab department   for your tests - we will call you with the results when they are available.      The key is to stop smoking completely before smoking completely stops you!      Please schedule a follow up visit in 3 months but call sooner if needed with PFTs on return

## 2022-09-14 NOTE — Assessment & Plan Note (Signed)
Active smoker - Labs ordered 09/14/2022  :  allergy screen  alpha one AT phenotype   - 09/14/2022   Walked on RA  x  3  lap(s) =  approx 450  ft  @ mod fast pace, stopped due to end of study with lowest 02 sats 93% mild sob last lap  - 09/14/2022  After extensive coaching inhaler device,  effectiveness =    80%    Group D (now reclassified as E) in terms of symptom/risk and laba/lama/ICS  therefore appropriate rx at this point >>>  breztri and approp saba trial

## 2022-09-19 LAB — ALPHA-1-ANTITRYPSIN PHENOTYP: A-1 Antitrypsin: 197 mg/dL — ABNORMAL HIGH (ref 101–187)

## 2022-09-19 LAB — CBC WITH DIFFERENTIAL/PLATELET
Basophils Absolute: 0 10*3/uL (ref 0.0–0.2)
Basos: 1 %
EOS (ABSOLUTE): 0.2 10*3/uL (ref 0.0–0.4)
Eos: 3 %
Hematocrit: 46.5 % (ref 37.5–51.0)
Hemoglobin: 16.3 g/dL (ref 13.0–17.7)
Immature Grans (Abs): 0.1 10*3/uL (ref 0.0–0.1)
Immature Granulocytes: 1 %
Lymphocytes Absolute: 2.2 10*3/uL (ref 0.7–3.1)
Lymphs: 27 %
MCH: 32.5 pg (ref 26.6–33.0)
MCHC: 35.1 g/dL (ref 31.5–35.7)
MCV: 93 fL (ref 79–97)
Monocytes Absolute: 0.7 10*3/uL (ref 0.1–0.9)
Monocytes: 8 %
Neutrophils Absolute: 5 10*3/uL (ref 1.4–7.0)
Neutrophils: 60 %
Platelets: 226 10*3/uL (ref 150–450)
RBC: 5.01 x10E6/uL (ref 4.14–5.80)
RDW: 12 % (ref 11.6–15.4)
WBC: 8.3 10*3/uL (ref 3.4–10.8)

## 2022-09-29 ENCOUNTER — Ambulatory Visit: Payer: Medicare Other | Admitting: Orthopaedic Surgery

## 2022-12-20 NOTE — Progress Notes (Unsigned)
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     History of Present Illness  09/14/2022  Pulmonary/ 1st office eval/ Lastacia Solum / [place] [facility]  Chief Complaint  Patient presents with   Consult    SOB/ COPD   Dyspnea:  onset x 2018 gradually worse since then to point at ov better since rx bevespi limited back and breathing [employer] lower pace = MMRC3 = can't walk 100 yards even at a slow pace at a flat grade s stopping due to sob   Cough: rattling cough in am / Sleep: flat bed / one pillow at least once a week wakes up and needs albuterol  SABA use: much less on bevespi 02: none  Lung cancer screen: referred at [facility] 09/14/2022  Rec Plan A = Automatic = Always=    Breztri or Bevesp Take 2 puffs first thing in am and then another 2 puffs about 12 hours later.   Work on inhaler technique:  Plan B = Backup (to supplement plan A, not to replace it) Only use your albuterol inhaler as a rescue medication Plan C = Crisis (instead of Plan B but only if Plan B stops working) - only use your albuterol nebulizer if you first try Plan B and it fails to help > ok to use the nebulizer up to every 4 hours but if start needing it regularly call for immediate appointment The key is to stop smoking completely before smoking completely stops you! Please schedule a follow up visit in 3 months but call sooner if needed with PFTs on return  allergy screen Eos 0.2  alpha one AT phenotype  MM    12/21/2022  f/u ov/[place] office/Neyla Gauntt re: copd/ab maint on breztri   Chief Complaint  Patient presents with   Follow-up    Pt f/u states that he is doing better on Breztri and feels like his COPD is controlled  Dyspnea:  more walking  eg [employer] s stopping  = MMRC2 = can't walk a nl pace on a flat grade s sob but does fine  slow and flat   Cough: no am flares / slt smoker's rattle Sleeping: no longer using noct inhalers  SABA use: none  02: none  Lung cancer screening: referred  12/21/2022   No obvious day to day or daytime variability or assoc excess/ purulent sputum or mucus plugs or hemoptysis or cp or chest tightness, subjective wheeze or overt sinus or hb symptoms.   Sleeping  without nocturnal  or early am exacerbation  of respiratory  c/o's or need for noct saba. Also denies any obvious fluctuation of symptoms with weather or environmental changes or other aggravating or alleviating factors except as outlined above   No unusual exposure hx or h/o childhood pna/ asthma or knowledge of premature birth.  Current Allergies, Complete Past Medical History, Past Surgical History, Family History, and Social History were reviewed in [employer] record.  ROS  The following are not active complaints unless bolded Hoarseness, sore throat, dysphagia, dental problems, itching, sneezing,  nasal congestion or discharge of excess mucus or purulent secretions, ear ache,   fever, chills, sweats, unintended wt loss or wt gain, classically pleuritic or exertional cp,  orthopnea pnd or arm/hand swelling  or  leg swelling, presyncope, palpitations, abdominal pain, anorexia, nausea, vomiting, diarrhea  or change in bowel habits or change in bladder habits, change in stools or change in urine, dysuria, hematuria,  rash, arthralgias, visual complaints, headache, numbness, weakness or ataxia or problems with walking or coordination,  change in mood or  memory.        Current Meds  Medication Sig   albuterol (VENTOLIN HFA) 108 (90 Base) MCG/ACT inhaler Inhale 2 puffs into the lungs every 4 (four) hours as needed for wheezing or shortness of breath.   amLODipine (NORVASC) 5 MG tablet Take 1 tablet (5 mg total) by mouth daily.   aspirin EC 81 MG tablet Take 1 tablet (81 mg total) by mouth daily with breakfast.    betamethasone dipropionate 0.05 % cream Apply 1 Application topically 2 (two) times daily.   Budeson-Glycopyrrol-Formoterol (BREZTRI AEROSPHERE) 160-9-4.8 MCG/ACT AERO Inhale 2 puffs into the lungs 2 (two) times daily.   ipratropium-albuterol (DUONEB) 0.5-2.5 (3) MG/3ML SOLN Take 3 mLs by nebulization every 6 (six) hours as needed (shortness of breath).   linaclotide (LINZESS) 290 MCG CAPS capsule Take 1 capsule (290 mcg total) by mouth daily before breakfast.   metoprolol succinate (TOPROL-XL) 25 MG 24 hr tablet TAKE 2 TABLETS BY MOUTH IN THE MORNING AND 1 TABLET BY MOUTH IN THE EVENING.   naloxone (NARCAN) 4 MG/0.1ML LIQD nasal spray kit Place 1 spray into the nose as needed (as directed).   nitroGLYCERIN (NITROSTAT) 0.4 MG SL tablet Place 1 tablet (0.4 mg total) under the tongue every 5 (five) minutesas needed for chest pain.   oxyCODONE-acetaminophen (PERCOCET) 7.5-325 MG tablet Take 1 tablet by mouth 4 (four) times daily as needed for moderate pain.   temazepam (RESTORIL) 30 MG capsule Take 60 mg by mouth at bedtime.   XTAMPZA ER 13.5 MG C12A Take 13.5 mg by mouth 2 (two) times daily.                Past Medical History:  Diagnosis Date   Arthritis    Chronic back pain    Collagen vascular disease ([place])    Colonic mass    History, s/p removal; per patient.   Constipation    COPD (chronic obstructive pulmonary disease) (HCC)    Coronary atherosclerosis of native coronary artery    Dense coronary atherosclerosis by chest CT November 2014    Dyspnea    when walking distance   Essential hypertension    Essential tremor    History of chicken pox    History of [place] measles    Neuropathy    Pneumonia    2015 ish   Sciatica        Objective:     Wt Readings from Last 3 Encounters:  12/21/22 167 lb 6.4 oz (75.9 kg)  09/14/22 177 lb 6.4 oz (80.5 kg)  06/24/22 176 lb (79.8 kg)    Vital signs reviewed  12/21/2022  - Note at rest 02 sats  97% on RA   General appearance:     elderly amb wm nad   HEENT : Oropharynx  clear      NECK :  without  apparent JVD/ palpable Nodes/TM    LUNGS: no acc muscle use,  Mild barrel  contour chest wall with bilateral  Distant bs s audible wheeze and  without cough on insp or exp maneuvers  and mild  Hyperresonant  to  percussion bilaterally     CV:  RRR  no s3 or  murmur or increase in P2, and no edema   ABD:  soft and nontender with pos end  insp Hoover's  in the supine position.  No bruits or organomegaly appreciated   MS:  Nl gait/ ext warm without deformities Or obvious joint restrictions  calf tenderness, cyanosis or clubbing     SKIN: warm and dry without lesions    NEURO:  alert, approp, nl sensorium with  no motor or cerebellar deficits apparent.      I personally reviewed images and agree with radiology impression as follows:  Chest CTa   10/02/21 Respiratory motion severely limits evaluation. Stable emphysema. No gross consolidation. There may be areas of bronchial thickening in both lungs. Chronic scarring in the right lung appears stable. Stable nodularity at the right apex. More prominent atelectasis in the inferior lingula.      Assessment

## 2022-12-21 ENCOUNTER — Ambulatory Visit (INDEPENDENT_AMBULATORY_CARE_PROVIDER_SITE_OTHER): Payer: Medicare Other | Admitting: Internal Medicine

## 2022-12-21 ENCOUNTER — Encounter: Payer: Self-pay | Admitting: Internal Medicine

## 2022-12-21 VITALS — BP 122/68 | HR 84 | Ht 70.0 in | Wt 167.4 lb

## 2022-12-21 DIAGNOSIS — F17210 Nicotine dependence, cigarettes, uncomplicated: Secondary | ICD-10-CM | POA: Diagnosis not present

## 2022-12-21 DIAGNOSIS — J449 Chronic obstructive pulmonary disease, unspecified: Secondary | ICD-10-CM | POA: Diagnosis not present

## 2022-12-21 NOTE — Assessment & Plan Note (Addendum)
Active smoker/MM - Labs ordered 09/14/2022  :  allergy screen Eos 0.2  alpha one AT phenotype  197 MM  - 09/14/2022   Walked on RA  x  3  lap(s) =  approx 450  ft  @ mod fast pace, stopped due to end of study with lowest 02 sats 93% mild sob last lap  - 09/14/2022  After extensive coaching inhaler device,  effectiveness =    80%    Group D (now reclassified as E) in terms of symptom/risk and laba/lama/ICS  therefore appropriate rx at this point >>>  continue breztri and approp saba           Each maintenance medication was reviewed in detail including emphasizing most importantly the difference between maintenance and prns and under what circumstances the prns are to be triggered using an action plan format where appropriate.  Total time for H and P, chart review, counseling, reviewing hfa device(s) and generating customized AVS unique to this office visit / same day charting = 20 min

## 2022-12-21 NOTE — Patient Instructions (Signed)
My office will be contacting you by phone for referral to lung cancer screening program   - if you don't hear back from my office within one week please call us back or notify us thru MyChart and we'll address it right away.   Please schedule a follow up visit in 6  months but call sooner if needed

## 2022-12-21 NOTE — Assessment & Plan Note (Signed)
Referred for lung cancer screening 09/14/2022  and 12/21/2022   4-5 min discussion re active cigarette smoking in addition to office E&M  Ask about tobacco use:   ongoing Advise quitting   I took an extended  opportunity with this patient to outline the consequences of continued cigarette use  in airway disorders based on all the data we have from the multiple national lung health studies (perfomed over decades at millions of dollars in cost)  indicating that smoking cessation, not choice of inhalers or physicians, is the most important aspect of his care.   Assess willingness:  Not fully committed at this point Assist in quit attempt:  Per PCP when ready Arrange follow up:   Follow up per Primary Care planned     In meantime: Low-dose CT lung cancer screening is recommended for patients who are 32-39 years of age with a 20+ pack-year history of smoking and who are currently smoking or quit <=15 years ago. No coughing up blood  No unintentional weight loss of > 15 pounds in the last 6 months - pt is eligible for scanning yearly until > eligible until age 4 >>> referred today

## 2023-02-07 ENCOUNTER — Other Ambulatory Visit: Payer: Self-pay | Admitting: Internal Medicine

## 2023-03-01 ENCOUNTER — Other Ambulatory Visit: Payer: Self-pay | Admitting: Student

## 2023-03-14 ENCOUNTER — Other Ambulatory Visit: Payer: Self-pay | Admitting: Cardiology

## 2023-04-26 ENCOUNTER — Other Ambulatory Visit: Payer: Self-pay | Admitting: Student

## 2023-04-26 MED ORDER — METOPROLOL SUCCINATE ER 25 MG PO TB24: ORAL_TABLET | ORAL | 0 refills | Status: DC

## 2023-05-04 ENCOUNTER — Ambulatory Visit: Payer: Medicare Other | Attending: Cardiology | Admitting: Cardiology

## 2023-05-04 ENCOUNTER — Encounter: Payer: Self-pay | Admitting: Cardiology

## 2023-05-04 VITALS — BP 138/78 | HR 76 | Ht 70.0 in | Wt 157.0 lb

## 2023-05-04 DIAGNOSIS — R931 Abnormal findings on diagnostic imaging of heart and coronary circulation: Secondary | ICD-10-CM | POA: Diagnosis present

## 2023-05-04 DIAGNOSIS — I10 Essential (primary) hypertension: Secondary | ICD-10-CM | POA: Insufficient documentation

## 2023-05-04 DIAGNOSIS — I2510 Atherosclerotic heart disease of native coronary artery without angina pectoris: Secondary | ICD-10-CM | POA: Diagnosis present

## 2023-05-04 MED ORDER — ROSUVASTATIN CALCIUM 5 MG PO TABS: ORAL_TABLET | ORAL | 3 refills | Status: DC

## 2023-05-04 MED ORDER — METOPROLOL SUCCINATE ER 25 MG PO TB24: ORAL_TABLET | ORAL | 2 refills | Status: DC

## 2023-05-04 MED ORDER — AMLODIPINE BESYLATE 5 MG PO TABS: 5.0000 mg | ORAL_TABLET | Freq: Every day | ORAL | 2 refills | Status: DC

## 2023-05-04 NOTE — Progress Notes (Signed)
    Cardiology Office Note  Date: 05/04/2023   ID: Peter Campbell, DOB 10/18/1955, MRN [id number]  History of Present Illness: Peter Campbell is a 67 y.o. male last seen in June 2023 by Ms. Strader PA-C, I reviewed the note.  He is here for a routine visit.  He does not report any angina or change in dyspnea on exertion which is generally NYHA class II.  No palpitations or syncope.  I reviewed his medications.  He continues on Norvasc, Toprol-XL, and has as needed nitroglycerin available.  We discussed addition of low-dose statin therapy for additional risk factor reduction in the setting of atherosclerosis.  His LDL was 80 in May of last year.  ECG today shows sinus rhythm with rightward axis.  Physical Exam: VS:  BP 138/78   Pulse 76   Ht 5\' 10"  (1.778 m)   Wt 157 lb (71.2 kg)   SpO2 94%   BMI 22.53 kg/m , BMI Body mass index is 22.53 kg/m.  Wt Readings from Last 3 Encounters:  05/04/23 157 lb (71.2 kg)  12/21/22 167 lb 6.4 oz (75.9 kg)  09/14/22 177 lb 6.4 oz (80.5 kg)    General: Patient appears comfortable at rest. HEENT: Conjunctiva and lids normal. Neck: Supple, no elevated JVP or carotid bruits. Lungs: Clear to auscultation, nonlabored breathing at rest. Cardiac: Regular rate and rhythm, no S3, 1/6 systolic murmur. Extremities: No pitting edema.  ECG:  An ECG dated 01/30/2022 was personally reviewed today and demonstrated:  Sinus rhythm.  Labwork: 09/14/2022: Hemoglobin 16.3; Platelets 226  May 2023: Cholesterol 140, triglycerides 79, HDL 44, LDL 80  Other Studies Reviewed Today:  Echocardiogram 01/31/2022:  1. Left ventricular ejection fraction, by estimation, is 55 to 60%. The  left ventricle has normal function. The left ventricle has no regional  wall motion abnormalities. Left ventricular diastolic parameters are  indeterminate.   2. Right ventricular systolic function is normal. The right ventricular  size is normal. Tricuspid regurgitation signal is inadequate  for assessing  PA pressure.   3. Left atrial size was mild to moderately dilated.   4. The mitral valve is grossly normal. Trivial mitral valve  regurgitation.   5. The aortic valve is tricuspid. Aortic valve regurgitation is not  visualized.   6. The inferior vena cava is normal in size with greater than 50%  respiratory variability, suggesting right atrial pressure of 3 mmHg.   Assessment and Plan:  1.  Coronary artery calcification by CT imaging.  Coronary calcium score greater than 400 with Lexiscan Myoview showing no evidence of ischemia in May 2023 [facility]).  He has stopped aspirin given frequent bruising.  Plan to initiate low-dose statin therapy and if tolerated check FLP and LFTs in 3 months.  2.  Essential hypertension.  Refills provided for Norvasc and Toprol-XL.  Disposition:  Follow up  1 year, sooner if needed.  Signed, Jonelle Sidle, M.D., F.A.C.C. [facility] at [facility]

## 2023-05-04 NOTE — Patient Instructions (Signed)
Medication Instructions:   Take Crestor 5 mg with dinner on Mondays and Thursdays  Labwork: In 3 months (November) Fasting Lipid and LFT's  Testing/Procedures: None today  Follow-Up: 1 year  Any Other Special Instructions Will Be Listed Below (If Applicable).  If you need a refill on your cardiac medications before your next appointment, please call your pharmacy.

## 2023-05-06 ENCOUNTER — Other Ambulatory Visit: Payer: Self-pay | Admitting: *Deleted

## 2023-05-06 DIAGNOSIS — Z87891 Personal history of nicotine dependence: Secondary | ICD-10-CM

## 2023-05-06 DIAGNOSIS — Z122 Encounter for screening for malignant neoplasm of respiratory organs: Secondary | ICD-10-CM

## 2023-05-06 DIAGNOSIS — F17210 Nicotine dependence, cigarettes, uncomplicated: Secondary | ICD-10-CM

## 2023-05-17 ENCOUNTER — Other Ambulatory Visit: Payer: Self-pay | Admitting: Internal Medicine

## 2023-05-17 NOTE — Telephone Encounter (Signed)
Pt needs an appt for refills.  

## 2023-05-18 ENCOUNTER — Telehealth: Payer: Self-pay

## 2023-05-18 ENCOUNTER — Other Ambulatory Visit: Payer: Self-pay

## 2023-05-18 MED ORDER — LINACLOTIDE 290 MCG PO CAPS: 290.0000 ug | ORAL_CAPSULE | Freq: Every day | ORAL | 0 refills | Status: DC

## 2023-05-18 NOTE — Telephone Encounter (Signed)
Pt is needing a refill on Linzess 290. Pt is scheduled to see you on 05/25/2023 for further refills. Is it ok to send in a 30 day supply?

## 2023-05-18 NOTE — Telephone Encounter (Signed)
Rx was sent to pharmacy on file.  

## 2023-05-25 ENCOUNTER — Encounter: Payer: Self-pay | Admitting: Internal Medicine

## 2023-05-25 ENCOUNTER — Ambulatory Visit: Payer: Medicare Other | Admitting: Internal Medicine

## 2023-05-25 VITALS — BP 143/84 | HR 73 | Temp 98.6°F | Ht 70.0 in | Wt 158.2 lb

## 2023-05-25 DIAGNOSIS — K5900 Constipation, unspecified: Secondary | ICD-10-CM | POA: Diagnosis not present

## 2023-05-25 DIAGNOSIS — Z86010 Personal history of colonic polyps: Secondary | ICD-10-CM | POA: Diagnosis not present

## 2023-05-25 NOTE — Patient Instructions (Signed)
It was good to see you again today!  Continue Linzess 290 daily (dispense 90 with 3 additional refills)  Information on constipation provided  Blood pressure up a little today have it rechecked in the near future  Plan for repeat colonoscopy 6 years from now  Office visit in 1 year

## 2023-05-25 NOTE — Progress Notes (Unsigned)
Primary Care Physician:  Alvina Filbert, MD Primary Gastroenterologist:  Dr. Jena Gauss  Pre-Procedure History & Physical: HPI:  Peter Campbell is a 67 y.o. male here for follow-up constipation.  Linzess 290 daily has done wonders for his constipation; he has bowel function daily.  Very pleased.  He would like a refill.  No bleeding or other alarm symptoms.  Distant history of colonic adenomas; due for repeat colonoscopy in 6 years from now.  On chronic opioid therapy.  Blood pressure up a bit today on recheck.  Past Medical History:  Diagnosis Date   Arthritis    Chronic back pain    Collagen vascular disease (HCC)    Colonic mass    History, s/p removal; per patient.   Constipation    COPD (chronic obstructive pulmonary disease) (HCC)    Coronary atherosclerosis of native coronary artery    Dense coronary atherosclerosis by chest CT November 2014    Dyspnea    when walking distance   Essential hypertension    Essential tremor    History of chicken pox    History of German measles    Neuropathy    Pneumonia    2015 ish   Sciatica     Past Surgical History:  Procedure Laterality Date   CIRCUMCISION     COLON SURGERY  '80's   for mass removal, colonostomy   COLONOSCOPY WITH PROPOFOL N/A 05/03/2017   Two 3-5 mm polyps in rectum and descending colon s/p removal, pancolonic diverticulosis, internal hemorrhoids. Benign. Surveillance due in 5 years.    COLONOSCOPY WITH PROPOFOL N/A 06/24/2022   Procedure: COLONOSCOPY WITH PROPOFOL;  Surgeon: Corbin Ade, MD;  [facility];  Service: Endoscopy;  Laterality: N/A;  2:00 PM, pt knows to arrive at 11:30   colonostomy reversal  80's   LUMBAR FUSION  11/27/2019   Lumber discectomy     Middle finger Right    fracture   NASAL SEPTUM SURGERY     POLYPECTOMY  05/03/2017   Procedure: POLYPECTOMY;  Surgeon: Corbin Ade, MD;  [facility]: [facility];  Service: Endoscopy;;  descending colon and rectal   POLYPECTOMY   06/24/2022   Procedure: POLYPECTOMY;  Surgeon: Corbin Ade, MD;  [facility]: [facility];  Service: Endoscopy;;   SHOULDER ACROMIOPLASTY Right 06/12/2014   Procedure: RIGHT SHOULDER ACROMIOPLASTY;  Surgeon: Darreld Mclean, MD;  Location: [facility];  Service: Orthopedics;  Laterality: Right;   SHOULDER OPEN ROTATOR CUFF REPAIR Right 06/12/2014   Procedure: OPEN REPAIR ROTATOR CUFF RIGHT ;  Surgeon: Darreld Mclean, MD;  Location: [facility];  Service: Orthopedics;  Laterality: Right;   TONSILLECTOMY     TYMPANOSTOMY TUBE PLACEMENT      Prior to Admission medications   Medication Sig Start Date End Date Taking? Authorizing Provider  albuterol (VENTOLIN HFA) 108 (90 Base) MCG/ACT inhaler Inhale 2 puffs into the lungs every 4 (four) hours as needed for wheezing or shortness of breath. 01/31/22  Yes Emokpae, Courage, MD  amLODipine (NORVASC) 5 MG tablet Take 1 tablet (5 mg total) by mouth daily. 05/04/23  Yes Jonelle Sidle, MD  Budeson-Glycopyrrol-Formoterol (BREZTRI AEROSPHERE) 160-9-4.8 MCG/ACT AERO Inhale 2 puffs into the lungs 2 (two) times daily. 09/14/22  Yes Nyoka Cowden, MD  GNP MELATONIN 10 MG SUBL Take 1 tablet by mouth at bedtime as needed. 04/30/23  Yes [provider]  ipratropium-albuterol (DUONEB) 0.5-2.5 (3) MG/3ML SOLN Take 3 mLs by nebulization every 6 (six) hours  as needed (shortness of breath). 01/31/22  Yes Shon Hale, MD  linaclotide Karlene Einstein) 290 MCG CAPS capsule Take 1 capsule (290 mcg total) by mouth daily before breakfast. 05/18/23  Yes Micaiah Litle, Gerrit Friends, MD  metoprolol succinate (TOPROL-XL) 25 MG 24 hr tablet TAKE 2 TABLETS BY MOUTH IN THE MORNING AND 1 TABLET BY MOUTH IN THE EVENING. 05/04/23  Yes Jonelle Sidle, MD  naloxone [facility]) 4 MG/0.1ML LIQD nasal spray kit Place 1 spray into the nose as needed (as directed).   Yes [provider]  nitroGLYCERIN (NITROSTAT) 0.4 MG SL tablet Place 1 tablet (0.4 mg total) under the tongue every 5 (five) minutesas needed  for chest pain. 07/09/22  Yes Jonelle Sidle, MD  oxyCODONE ER 9 MG C12A Take 9 mg by mouth 2 (two) times daily. 04/03/20  Yes [provider]  oxyCODONE-acetaminophen (PERCOCET/ROXICET) 5-325 MG tablet Take 1 tablet by mouth every 8 (eight) hours as needed for severe pain.   Yes [provider]  temazepam (RESTORIL) 30 MG capsule Take 60 mg by mouth at bedtime.   Yes [provider]    Allergies as of 05/25/2023 - Review Complete 05/25/2023  Allergen Reaction Noted   Fentanyl Other (See Comments) 05/16/2019    Family History  Problem Relation Age of Onset   COPD Father    Cancer Father    Cancer Mother    Heart attack Brother    Epilepsy Sister    Hypertension Sister    Colon cancer Neg Hx     Social History   Socioeconomic History   Marital status: Widowed    Spouse name: Not on file   Number of children: Not on file   Years of education: Not on file   Highest education level: Not on file  Occupational History   Occupation: Disabled  Tobacco Use   Smoking status: Every Day    Current packs/day: 1.00    Average packs/day: 1 pack/day for 53.7 years (53.7 ttl pk-yrs)    Types: Cigarettes    Start date: 08/28/1969   Smokeless tobacco: Never  Vaping Use   Vaping status: Never Used  Substance and Sexual Activity   Alcohol use: No    Alcohol/week: 0.0 standard drinks of alcohol   Drug use: Yes    Types: Marijuana, Other-see comments    Comment: Pt uses Xanax- prn   Sexual activity: Not Currently  Other Topics Concern   Not on file  Social History Narrative   Pt lives alone in [place]; on disability   Social Determinants of Health   Financial Resource Strain: Not on file  Food Insecurity: Not on file  Transportation Needs: Not on file  Physical Activity: Not on file  Stress: Not on file  Social Connections: Not on file  Intimate Partner Violence: Not on file    Review of Systems: See HPI, otherwise negative ROS  Physical  Exam: BP (!) 143/84 (BP Location: Right Arm, Patient Position: Sitting, Cuff Size: Normal)   Pulse 73   Temp 98.6 F (37 C) (Oral)   Ht 5\' 10"  (1.778 m)   Wt 158 lb 3.2 oz (71.8 kg)   SpO2 96%   BMI 22.70 kg/m  General:   Alert,  Well-developed, well-nourished, pleasant and cooperative in NAD Lungs:  Clear throughout to auscultation.   No wheezes, crackles, or rhonchi. No acute distress. Heart:  Regular rate and rhythm; no murmurs, clicks, rubs,  or gallops. Abdomen: Non-distended, normal bowel sounds.  Soft and  nontender without appreciable mass or hepatosplenomegaly.   Impression/Plan: 67 year old gentleman with chronic opioid-induced constipation doing very well on Linzess 290 daily. History of colonic adenoma; due for surveillance in 6 years. Blood pressure mildly elevated today  Recommendations:  Continue Linzess 290 daily (dispense 90 with 3 additional refills)  Information on constipation provided  Blood pressure up a little today - recheck in the near future  Plan for repeat colonoscopy 6 years from now  Office visit in 1 year   Notice: This dictation was prepared with Dragon dictation along with smaller phrase technology. Any transcriptional errors that result from this process are unintentional and may not be corrected upon review.

## 2023-06-16 ENCOUNTER — Encounter: Payer: Self-pay | Admitting: Acute Care

## 2023-06-16 ENCOUNTER — Ambulatory Visit: Payer: Medicare Other | Admitting: Acute Care

## 2023-06-16 DIAGNOSIS — F17210 Nicotine dependence, cigarettes, uncomplicated: Secondary | ICD-10-CM

## 2023-06-16 NOTE — Patient Instructions (Signed)
Thank you for participating in the [facility]. It was our pleasure to meet you today. We will call you with the results of your scan within the next few days. Your scan will be assigned a Lung RADS category score by the physicians reading the scans.  This Lung RADS score determines follow up scanning.  See below for description of categories, and follow up screening recommendations. We will be in touch to schedule your follow up screening annually or based on recommendations of our providers. We will fax a copy of your scan results to your Primary Care Physician, or the physician who referred you to the program, to ensure they have the results. Please call the office if you have any questions or concerns regarding your scanning experience or results.  Our office number is [phone]. Please speak with Abigail Miyamoto, RN., Karlton Lemon RN, or Pietro Cassis RN. They are  our Lung Cancer Screening RN.'s If They are unavailable when you call, Please leave a message on the voice mail. We will return your call at our earliest convenience.This voice mail is monitored several times a day.  Remember, if your scan is normal, we will scan you annually as long as you continue to meet the criteria for the program. (Age 30-80, Current smoker or smoker who has quit within the last 15 years). If you are a smoker, remember, quitting is the single most powerful action that you can take to decrease your risk of lung cancer and other pulmonary, breathing related problems. We know quitting is hard, and we are here to help.  Please let us know if there is anything we can do to help you meet your goal of quitting. If you are a former smoker, Counselling psychologist. We are proud of you! Remain smoke free! Remember you can refer friends or family members through the number above.  We will screen them to make sure they meet criteria for the program. Thank you for helping [place] take better care of you  by participating in Lung Screening.   Lung RADS Categories:  Lung RADS 1: no nodules or definitely non-concerning nodules.  Recommendation is for a repeat annual scan in 12 months.  Lung RADS 2:  nodules that are non-concerning in appearance and behavior with a very low likelihood of becoming an active cancer. Recommendation is for a repeat annual scan in 12 months.  Lung RADS 3: nodules that are probably non-concerning , includes nodules with a low likelihood of becoming an active cancer.  Recommendation is for a 61-month repeat screening scan. Often noted after an upper respiratory illness. We will be in touch to make sure you have no questions, and to schedule your 20-month scan.  Lung RADS 4 A: nodules with concerning findings, recommendation is most often for a follow up scan in 3 months or additional testing based on our provider's assessment of the scan. We will be in touch to make sure you have no questions and to schedule the recommended 3 month follow up scan.  Lung RADS 4 B:  indicates findings that are concerning. We will be in touch with you to schedule additional diagnostic testing based on our provider's  assessment of the scan.  You can receive free nicotine replacement therapy ( patches, gum or mints) by calling 1-800-QUIT NOW. Please call so we can get you on the path to becoming  a non-smoker. I know it is hard, but you can do this!  Other options for assistance in  smoking cessation ( As covered by your insurance benefits)  Hypnosis for smoking cessation  [employer] [phone]  Acupuncture for smoking cessation  [employer] [phone]

## 2023-06-16 NOTE — Progress Notes (Signed)
Virtual Visit via Telephone Note  I connected with Peter Campbell on 06/16/23 at 11:30 AM EDT by telephone and verified that I am speaking with the correct person using two identifiers.  Location: Patient:  At home Provider:  [address], [place], [place], [address]    I discussed the limitations, risks, security and privacy concerns of performing an evaluation and management service by telephone and the availability of in person appointments. I also discussed with the patient that there may be a patient responsible charge related to this service. The patient expressed understanding and agreed to proceed.    Shared Decision Making Visit Lung Cancer Screening Program [phone])   Eligibility: Age 67 y.o. Pack Years Smoking History Calculation 120 pack year smoking history (# packs/per year x # years smoked) Recent History of coughing up blood  no Unexplained weight loss? no ( >Than 15 pounds within the last 6 months ) Prior History Lung / other cancer no (Diagnosis within the last 5 years already requiring surveillance chest CT Scans). Smoking Status Current Smoker Former Smokers: Years since quit:  NA  Quit Date:  NA  Visit Components: Discussion included one or more decision making aids. yes Discussion included risk/benefits of screening. yes Discussion included potential follow up diagnostic testing for abnormal scans. yes Discussion included meaning and risk of over diagnosis. yes Discussion included meaning and risk of False Positives. yes Discussion included meaning of total radiation exposure. yes  Counseling Included: Importance of adherence to annual lung cancer LDCT screening. yes Impact of comorbidities on ability to participate in the program. yes Ability and willingness to under diagnostic treatment. yes  Smoking Cessation Counseling: Current Smokers:  Discussed importance of smoking cessation. yes Information about tobacco cessation classes and  interventions provided to patient. yes Patient provided with "ticket" for LDCT Scan. yes Symptomatic Patient. no  Counseling NA Diagnosis Code: Tobacco Use Z72.0 Asymptomatic Patient yes  Counseling (Intermediate counseling: > three minutes counseling) B1478 Former Smokers:  Discussed the importance of maintaining cigarette abstinence. yes Diagnosis Code: Personal History of Nicotine Dependence. G95.621 Information about tobacco cessation classes and interventions provided to patient. Yes Patient provided with "ticket" for LDCT Scan. yes Written Order for Lung Cancer Screening with LDCT placed in Epic. Yes (CT Chest Lung Cancer Screening Low Dose W/O CM) [id number] Z12.2-Screening of respiratory organs Z87.891-Personal history of nicotine dependence  I have spent 25 minutes of face to face/ virtual visit   time with  Peter Campbell discussing the risks and benefits of lung cancer screening. We viewed / discussed a power point together that explained in detail the above noted topics. We paused at intervals to allow for questions to be asked and answered to ensure understanding.We discussed that the single most powerful action that he can take to decrease his risk of developing lung cancer is to quit smoking. We discussed whether or not he is ready to commit to setting a quit date. We discussed options for tools to aid in quitting smoking including nicotine replacement therapy, non-nicotine medications, support groups, Quit Smart classes, and behavior modification. We discussed that often times setting smaller, more achievable goals, such as eliminating 1 cigarette a day for a week and then 2 cigarettes a day for a week can be helpful in slowly decreasing the number of cigarettes smoked. This allows for a sense of accomplishment as well as providing a clinical benefit. I provided  him  with smoking cessation  information  with contact information for community resources,  classes, free nicotine replacement  therapy, and access to mobile apps, text messaging, and on-line smoking cessation help. I have also provided  him  the office contact information in the event he needs to contact me, or the screening staff. We discussed the time and location of the scan, and that either Abigail Miyamoto RN, Karlton Lemon, RN  or I will call / send a letter with the results within 24-72 hours of receiving them. The patient verbalized understanding of all of  the above and had no further questions upon leaving the office. They have my contact information in the event they have any further questions.  I spent 3-4 minutes counseling on smoking cessation and the health risks of continued tobacco abuse.  I explained to the patient that there has been a high incidence of coronary artery disease noted on these exams. I explained that this is a non-gated exam therefore degree or severity cannot be determined. This patient is not on statin therapy. I have asked the patient to follow-up with their PCP regarding any incidental finding of coronary artery disease and management with diet or medication as their PCP  feels is clinically indicated. The patient verbalized understanding of the above and had no further questions upon completion of the visit.      Bevelyn Ngo, NP 06/16/2023

## 2023-06-17 ENCOUNTER — Ambulatory Visit (HOSPITAL_COMMUNITY)
Admission: RE | Admit: 2023-06-17 | Discharge: 2023-06-17 | Disposition: A | Discharge status: Home / Self Care | Payer: Medicare Other | Source: Ambulatory Visit | Attending: Acute Care | Admitting: Acute Care

## 2023-06-17 DIAGNOSIS — Z87891 Personal history of nicotine dependence: Secondary | ICD-10-CM | POA: Insufficient documentation

## 2023-06-17 DIAGNOSIS — Z122 Encounter for screening for malignant neoplasm of respiratory organs: Secondary | ICD-10-CM | POA: Insufficient documentation

## 2023-06-17 DIAGNOSIS — F17210 Nicotine dependence, cigarettes, uncomplicated: Secondary | ICD-10-CM | POA: Diagnosis present

## 2023-06-18 ENCOUNTER — Other Ambulatory Visit: Payer: Self-pay | Admitting: Internal Medicine

## 2023-07-01 ENCOUNTER — Telehealth: Payer: Self-pay | Admitting: Acute Care

## 2023-07-01 NOTE — Telephone Encounter (Signed)
Call report received  IMPRESSION: 1. New aggressive appearing left upper lobe pulmonary nodule categorized as Lung-RADS 4BS, suspicious. Additional imaging evaluation or consultation with Pulmonology or Thoracic Surgery recommended. 2. The "S" modifier above refers to potentially clinically significant non lung cancer related findings. Specifically, there is aortic atherosclerosis, in addition to left main and three-vessel coronary artery disease. Please note that although the presence of coronary artery calcium documents the presence of coronary artery disease, the severity of this disease and any potential stenosis cannot be assessed on this non-gated CT examination. Assessment for potential risk factor modification, dietary therapy or pharmacologic therapy may be warranted, if clinically indicated. 3. Mild diffuse bronchial wall thickening with mild to moderate centrilobular and paraseptal emphysema; imaging findings suggestive of underlying COPD.

## 2023-07-01 NOTE — Telephone Encounter (Signed)
Diane calling with call report. For CT scan. Diane phone number is [phone].

## 2023-07-02 ENCOUNTER — Telehealth: Payer: Self-pay | Admitting: Acute Care

## 2023-07-02 ENCOUNTER — Other Ambulatory Visit: Payer: Self-pay | Admitting: Acute Care

## 2023-07-02 DIAGNOSIS — R911 Solitary pulmonary nodule: Secondary | ICD-10-CM

## 2023-07-02 NOTE — Telephone Encounter (Signed)
See other phone note 07/02/23.

## 2023-07-02 NOTE — Telephone Encounter (Signed)
I have called the patient with the results of his low dose Ct . His scan was read as a LR 4B. He has a LUL macrolobulated and slightly spiculated nodule which is new compared to the prior study with a volume derived mean diameter of 17.7 mm, concerning for probable primary bronchogenic neoplasm.I explained that next step is for PET scan to better evaluate this nodule, with follow up in the office to go over the results. Patient is in agreement with this plan.   Sherre Lain and [place], please fax results to PCP and let them know plan is for PET scan and follow up in the office with myself, Icard or [place]  to review results and plan next steps.   Dr. Sherene Sires, wanted to make sure you were aware of the results.   Thanks so much

## 2023-07-02 NOTE — Telephone Encounter (Signed)
Results and plan sent to PCP. Will hold message to continue to follow up on PET.

## 2023-07-02 NOTE — Telephone Encounter (Signed)
I have attempted to call the patient with the results of their  Low Dose CT Chest Lung cancer screening scan. There was no answer. I have left a HIPPA compliant VM requesting the patient call the office for the scan results. I included the office contact information in the message. We will await his return call. If no return call we will continue to call until patient is contacted.  Pt. Will need PET scan when I do get in touch with him. Please let me know if he calls back. Thanks so much

## 2023-07-06 ENCOUNTER — Other Ambulatory Visit: Payer: Self-pay

## 2023-07-06 ENCOUNTER — Telehealth: Payer: Self-pay | Admitting: Internal Medicine

## 2023-07-06 MED ORDER — BREZTRI AEROSPHERE 160-9-4.8 MCG/ACT IN AERO: 2.0000 | INHALATION_SPRAY | Freq: Two times a day (BID) | RESPIRATORY_TRACT | Status: DC

## 2023-07-06 NOTE — Telephone Encounter (Signed)
Okay to give samples per Dr Sherene Sires

## 2023-07-06 NOTE — Telephone Encounter (Signed)
Patient is a walk in today and is requesting samples of Breztri.  He states he had COVID an few weeks ago and must have used more than he should have and it is too early for his refill.  I told the patient we would call him regarding this , but he wanted to wait  the lobby

## 2023-07-11 NOTE — Progress Notes (Unsigned)
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     History of Present Illness  09/14/2022  Pulmonary/ 1st office eval/ Ilma Achee / [place] [facility]  Chief Complaint  Patient presents with   Consult    SOB/ COPD   Dyspnea:  onset x 2018 gradually worse since then to point at ov better since rx bevespi limited back and breathing [employer] half [employer] store lower pace = MMRC3 = can't walk 100 yards even at a slow pace at a flat grade s stopping due to sob   Cough: rattling cough in am / Sleep: flat bed / one pillow at least once a week wakes up and needs albuterol  SABA use: much less on bevespi 02: none  Lung cancer screen: referred at [facility] 09/14/2022  Rec Plan A = Automatic = Always=    Breztri or Bevesp Take 2 puffs first thing in am and then another 2 puffs about 12 hours later.   Work on inhaler technique:  Plan B = Backup (to supplement plan A, not to replace it) Only use your albuterol inhaler as a rescue medication Plan C = Crisis (instead of Plan B but only if Plan B stops working) - only use your albuterol nebulizer if you first try Plan B and it fails to help > ok to use the nebulizer up to every 4 hours but if start needing it regularly call for immediate appointment The key is to stop smoking completely before smoking completely stops you! Please schedule a follow up visit in 3 months but call sooner if needed with PFTs on return  allergy screen Eos 0.2  alpha one AT phenotype  MM    12/21/2022  f/u ov/[place] office/Jaelynn Currier re: copd/ab maint on breztri   Chief Complaint  Patient presents with   Follow-up    Pt f/u states that he is doing better on Breztri and feels like his COPD is controlled  Dyspnea:  more walking  eg [employer] s stopping  = MMRC2 = can't walk a nl pace on a flat grade s sob but does fine  slow and flat   Cough: no am flares / slt smoker's rattle Sleeping: no longer using noct inhalers  SABA use: none  02: none  Lung cancer screening: referred  12/21/2022  Rec No change rx     07/12/2023  f/u ov/[place] office/Adith Tejada re: *** maint on ***  No chief complaint on file.   Dyspnea:  *** Cough: *** Sleeping: ***   resp cc  SABA use: *** 02: ***  Lung cancer screening: ***   No obvious day to day or daytime variability or assoc excess/ purulent sputum or mucus plugs or hemoptysis or cp or chest tightness, subjective wheeze or overt sinus or hb symptoms.    Also denies any obvious fluctuation of symptoms with weather or environmental changes or other aggravating or alleviating factors except as outlined above   No unusual exposure hx or h/o childhood pna/ asthma or knowledge of premature birth.  Current Allergies, Complete Past Medical History, Past Surgical History, Family History, and Social History were reviewed in [employer] record.  ROS  The following are not active complaints unless bolded Hoarseness, sore throat, dysphagia, dental problems, itching, sneezing,  nasal congestion or discharge of  excess mucus or purulent secretions, ear ache,   fever, chills, sweats, unintended wt loss or wt gain, classically pleuritic or exertional cp,  orthopnea pnd or arm/hand swelling  or leg swelling, presyncope, palpitations, abdominal pain, anorexia, nausea, vomiting, diarrhea  or change in bowel habits or change in bladder habits, change in stools or change in urine, dysuria, hematuria,  rash, arthralgias, visual complaints, headache, numbness, weakness or ataxia or problems with walking or coordination,  change in mood or  memory.        No outpatient medications have been marked as taking for the 07/12/23 encounter (Appointment) with Nyoka Cowden, MD.                   Past Medical History:  Diagnosis Date   Arthritis    Chronic back pain     Collagen vascular disease (HCC)    Colonic mass    History, s/p removal; per patient.   Constipation    COPD (chronic obstructive pulmonary disease) (HCC)    Coronary atherosclerosis of native coronary artery    Dense coronary atherosclerosis by chest CT November 2014    Dyspnea    when walking distance   Essential hypertension    Essential tremor    History of chicken pox    History of [place] measles    Neuropathy    Pneumonia    2015 ish   Sciatica        Objective:    Wts  07/12/2023     ***   12/21/22 167 lb 6.4 oz (75.9 kg)  09/14/22 177 lb 6.4 oz (80.5 kg)  06/24/22 176 lb (79.8 kg)    Vital signs reviewed  07/12/2023  - Note at rest 02 sats  ***% on ***   General appearance:    ***     Mild bar***         Assessment

## 2023-07-12 ENCOUNTER — Ambulatory Visit (INDEPENDENT_AMBULATORY_CARE_PROVIDER_SITE_OTHER): Payer: Medicare Other | Admitting: Internal Medicine

## 2023-07-12 ENCOUNTER — Encounter: Payer: Self-pay | Admitting: Internal Medicine

## 2023-07-12 VITALS — BP 135/72 | HR 73 | Ht 70.0 in | Wt 156.0 lb

## 2023-07-12 DIAGNOSIS — F17210 Nicotine dependence, cigarettes, uncomplicated: Secondary | ICD-10-CM

## 2023-07-12 DIAGNOSIS — J449 Chronic obstructive pulmonary disease, unspecified: Secondary | ICD-10-CM | POA: Diagnosis not present

## 2023-07-12 DIAGNOSIS — R911 Solitary pulmonary nodule: Secondary | ICD-10-CM | POA: Diagnosis not present

## 2023-07-12 MED ORDER — ALBUTEROL SULFATE HFA 108 (90 BASE) MCG/ACT IN AERS: 2.0000 | INHALATION_SPRAY | RESPIRATORY_TRACT | 3 refills | Status: DC | PRN

## 2023-07-12 MED ORDER — BREZTRI AEROSPHERE 160-9-4.8 MCG/ACT IN AERO: INHALATION_SPRAY | RESPIRATORY_TRACT | 3 refills | Status: DC

## 2023-07-12 MED ORDER — BREZTRI AEROSPHERE 160-9-4.8 MCG/ACT IN AERO: 2.0000 | INHALATION_SPRAY | Freq: Two times a day (BID) | RESPIRATORY_TRACT | 5 refills | Status: DC

## 2023-07-12 MED ORDER — VENTOLIN HFA 108 (90 BASE) MCG/ACT IN AERS: 2.0000 | INHALATION_SPRAY | Freq: Four times a day (QID) | RESPIRATORY_TRACT | 3 refills | Status: DC | PRN

## 2023-07-12 NOTE — Patient Instructions (Signed)
Plan A = Automatic = Always=    Breztri Take 2 puffs first thing in am and then another 2 puffs about 12 hours later.    Work on inhaler technique:  relax and gently blow all the way out then take a nice smooth full deep breath back in, triggering the inhaler at same time you start breathing in.  Hold breath in for at least  5 seconds if you can. Blow out breztri  thru nose. Rinse and gargle with water when done.  If mouth or throat bother you at all,  try brushing teeth/gums/tongue with arm and hammer toothpaste/ make a slurry and gargle and spit out.       Plan B = Backup (to supplement plan A, not to replace it) Only use your albuterol inhaler as a rescue medication to be used if you can't catch your breath by resting or doing a relaxed purse lip breathing pattern.  - The less you use it, the better it will work when you need it. - Ok to use the inhaler up to 2 puffs  every 4 hours if you must but call for appointment if use goes up over your usual need - Don't leave home without it !!  (think of it like the spare tire for your car)   Plan C = Crisis (instead of Plan B but only if Plan B stops working) - only use your albuterol nebulizer if you first try Plan B and it fails to help > ok to use the nebulizer up to every 4 hours but if start needing it regularly call for immediate appointment   Also  Ok to try albuterol 15 min before an activity (on alternating days)  that you know would usually make you short of breath and see if it makes any difference and if makes none then don't take albuterol after activity unless you can't catch your breath as this means it's the resting that helps, not the albuterol.     Please schedule a follow up visit in 6 months but call sooner if needed

## 2023-07-12 NOTE — Assessment & Plan Note (Signed)
Counseled re importance of smoking cessation but did not meet time criteria for separate billing

## 2023-07-12 NOTE — Assessment & Plan Note (Signed)
Active smoker/MM - Labs ordered 09/14/2022  :  allergy screen Eos 0.2  alpha one AT phenotype  197 MM  - 09/14/2022   Walked on RA  x  3  lap(s) =  approx 450  ft  @ mod fast pace, stopped due to end of study with lowest 02 sats 93% mild sob last lap  - 09/14/2022  After extensive coaching inhaler device,  effectiveness =    80%    Group D (now reclassified as E) in terms of symptom/risk and laba/lama/ICS  therefore appropriate rx at this point >>>  continue breztri but use more approp saba: Marland Kitchen Re SABA :  I spent extra time with pt today reviewing appropriate use of albuterol for prn use on exertion with the following points: 1) saba is for relief of sob that does not improve by walking a slower pace or resting but rather if the pt does not improve after trying this first. 2) If the pt is convinced, as many are, that saba helps recover from activity faster then it's easy to tell if this is the case by re-challenging : ie stop, take the inhaler, then p 5 minutes try the exact same activity (intensity of workload) that just caused the symptoms and see if they are substantially diminished or not after saba 3) if there is an activity that reproducibly causes the symptoms, try the saba 15 min before the activity on alternate days   If in fact the saba really does help, then fine to continue to use it prn but advised may need to look closer at the maintenance regimen being used to achieve better control of airways disease with exertion.

## 2023-07-12 NOTE — Assessment & Plan Note (Addendum)
See LDSCT 06/17/23 Post LUL macrolobulated and slightly spiculated nodule which is new compared to the prior study with a volume derived mean diameter of 17.7 mm, - PET  07/15/23 >>>  Unfortunately with wt loss and new unsmooth contour lung nodule in this active smoker > bronchogenic ca risk quite high > advised to complete the PET then likely will need navigational bx and pfts but defer the f/u to the lung cancer screening clinic  Discussed in detail all the  indications, usual  risks and alternatives  relative to the benefits with patient who agrees to proceed with w/u as outlined.          F/u in this clinic is 6 m, sooner if needed   Each maintenance medication was reviewed in detail including emphasizing most importantly the difference between maintenance and prns and under what circumstances the prns are to be triggered using an action plan format where appropriate.  Total time for H and P, chart review, counseling, reviewing hfa  device(s) and generating customized AVS unique to this office visit / same day charting = 31 min

## 2023-07-15 ENCOUNTER — Encounter (HOSPITAL_COMMUNITY)
Admission: RE | Admit: 2023-07-15 | Discharge: 2023-07-15 | Disposition: A | Discharge status: Home / Self Care | Payer: Medicare Other | Source: Ambulatory Visit | Attending: Acute Care | Admitting: Acute Care

## 2023-07-15 DIAGNOSIS — R911 Solitary pulmonary nodule: Secondary | ICD-10-CM | POA: Insufficient documentation

## 2023-07-15 MED ORDER — FLUDEOXYGLUCOSE F - 18 (FDG) INJECTION
8.5000 | Freq: Once | INTRAVENOUS | Status: AC | PRN
  Administered 2023-07-15: 8.5 via INTRAVENOUS

## 2023-07-20 ENCOUNTER — Other Ambulatory Visit: Payer: Self-pay | Admitting: Internal Medicine

## 2023-07-21 ENCOUNTER — Other Ambulatory Visit: Payer: Self-pay

## 2023-07-27 ENCOUNTER — Encounter: Payer: Self-pay | Admitting: Acute Care

## 2023-07-27 ENCOUNTER — Encounter: Payer: Self-pay | Admitting: Emergency Medicine

## 2023-07-27 ENCOUNTER — Ambulatory Visit (INDEPENDENT_AMBULATORY_CARE_PROVIDER_SITE_OTHER): Payer: Medicare Other | Admitting: Acute Care

## 2023-07-27 VITALS — BP 140/70 | HR 67 | Ht 70.0 in | Wt 155.0 lb

## 2023-07-27 DIAGNOSIS — F17200 Nicotine dependence, unspecified, uncomplicated: Secondary | ICD-10-CM | POA: Diagnosis not present

## 2023-07-27 DIAGNOSIS — R911 Solitary pulmonary nodule: Secondary | ICD-10-CM | POA: Diagnosis not present

## 2023-07-27 NOTE — Patient Instructions (Addendum)
It is good to see you today. As we discussed, the lung nodule we found on your screening scan was hypermetabolic on the PET scan , and we recommend a  biopsy of that nodule to determine if this is cancer.  Plan is for biopsy 08/03/2023 at 10:45 with Dr. Delton Coombes.  You will need someone to bring you to [facility], stay while you have the procedure, and drive you home, and stay with you for the day until anesthesia has cleared.  We discussed that there is a small risk of bleeding, infection, pneumothorax and adverse effects from anesthesia. You will get a call from anesthesia for further evaluation.   Please work on quitting smoking. You can receive free nicotine replacement therapy (patches, gum, or mints) by calling 1-800-QUIT NOW. Please call so we can get you on the path to becoming a non-smoker. I know it is hard, but you can do this!  Hypnosis for smoking cessation  [employer] [phone]  Acupuncture for smoking cessation  [employer] [phone]    Good luck with your procedure. You will follow up with me in the office 1 week after the biopsy to go over results.  We will add you to my schedule for 8:30 am on 08/10/2023.  Please contact office for sooner follow up if symptoms do not improve or worsen or seek emergency care

## 2023-07-27 NOTE — H&P (View-Only) (Signed)
History of Present Illness Peter Campbell is a 67 y.o. male  current every day smoker with a 120 pack year smoking history followed through the screening program.   07/27/2023 Pt. Presents for follow up after PET scan to better evaluate a 14 mm spiculated nodule in the left upper lobe. The PET scan was positive for hypermetabolic activity in the 14 mm spiculated nodule lesion in the left upper lobe.  We discussed that this is concerning for lung cancer and neck step should be to biopsy this lesion to determine tissue and diagnosis.  Patient is in agreement with this plan. He has been scheduled for August 03, 2023 at 10:45 AM with Dr. Delton Coombes.  The patient understands he will need to have someone with him to stay during the procedure and to drive him home in addition to staying with him for 24 hours after to ensure no adverse events to anesthesia.  Patient is not on blood thinners, he is not diabetic, he does not have allergies to latex.  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 He has been scheduled for a 1 week follow-up on November 12 to review results of biopsy. We have reviewed that there is a small risk of bleeding, infection, pneumothorax and adverse effects from anesthesia and having this procedure.  We discussed that it is a small risk however we always reviewed this with her patients prior to having them consent.  Patient verbalized understanding and is in agreement to moving forward with the bronchoscopy with biopsies.  I have also messaged Dr. Delton Coombes to see if he needs any additional imaging done prior to the procedure.  We provided the patient with a letter with additional instructions before he left the office today.  Test Results: PET Scan 07/15/2023 Hypermetabolic 14 mm spiculated nodular lesion in the left upper lobe consistent with a primary lung neoplasm. 2. No findings for metastatic  adenopathy or metastatic disease involving the abdomen/pelvis or bony structures. 3. Hypermetabolism in the neck as detailed above is likely due to dental disease and muscular activity. 4. Age advanced vascular disease.  Lung Cancer Screening CT chest 06/17/2023 In the posterior aspect of the left upper lobe abutting the major fissure (axial image 196) there is a macrolobulated and slightly spiculated nodule which is new compared to the prior study with a volume derived mean diameter of 17.7 mm, concerning for probable primary bronchogenic neoplasm. No acute consolidative airspace disease. No pleural effusions. Diffuse bronchial wall thickening with mild to moderate centrilobular and paraseptal emphysema. New aggressive appearing left upper lobe pulmonary nodule categorized as Lung-RADS 4BS, suspicious.       Latest Ref Rng & Units 01/31/2022    5:13 AM 01/30/2022    8:44 PM 10/05/2021    5:49 AM  BMP  Glucose 70 - 99 mg/dL 96  161  95   BUN 8 - 23 mg/dL 21  20  16    Creatinine 0.61 - 1.24 mg/dL 0.96  0.45  4.09   Sodium 135 - 145 mmol/L 138  139  137   Potassium 3.5 - 5.1 mmol/L 3.4  3.8  4.2   Chloride 98 - 111 mmol/L 103  104  94   CO2 22 - 32 mmol/L 28  29  35   Calcium 8.9 - 10.3 mg/dL 9.1  9.2  9.0     BNP  Component Value Date/Time   BNP 65.0 06/19/2015 1331    ProBNP No results found for: "PROBNP"  PFT No results found for: "FEV1PRE", "FEV1POST", "FVCPRE", "FVCPOST", "TLC", "DLCOUNC", "PREFEV1FVCRT", "PSTFEV1FVCRT"  NM PET Image Initial (PI) Skull Base To Thigh  Result Date: 07/26/2023 CLINICAL DATA:  Initial treatment strategy for new left upper lobe pulmonary lesion on lung cancer screening chest CT. EXAM: NUCLEAR MEDICINE PET SKULL BASE TO THIGH TECHNIQUE: 8.5 mCi F-18 FDG was injected intravenously. Full-ring PET imaging was performed from the skull base to thigh after the radiotracer. CT data was obtained and used for attenuation correction and anatomic  localization. Fasting blood glucose: 93 mg/dl COMPARISON:  Chest CT 13/04/6577 FINDINGS: Mediastinal blood pool activity: SUV max 1.97 Liver activity: SUV max NA NECK: Marked hypermetabolism in the roof of the mouth likely related to dental disease. There is also fairly significant hypermetabolism along the left tongue base and down into the left submandibular region. This has an elongated appearance images and is most likely activity in the stylohyoid muscle. I do not see any abnormality CT scan. I do not see any obvious abnormality on the CT scan. No enlarged or hypermetabolic neck nodes. This is most likely artifact related to dental disease and muscular activity. Incidental CT findings: None. CHEST: The 14 mm spiculated nodular lesion in the left upper lobe abutting the major fissure and somewhat tenting the fissure demonstrates hypermetabolism. SUV max is 9.01 and this is consistent with a primary lung neoplasm. No enlarged or hypermetabolic mediastinal or hilar lymph nodes to suggest metastatic adenopathy. No other pulmonary lesions are identified. Incidental CT findings: Stable emphysematous changes and pulmonary scarring. Stable age advanced aortic and coronary artery calcifications. ABDOMEN/PELVIS: No abnormal hypermetabolic activity within the liver, pancreas, adrenal glands, or spleen. No hypermetabolic lymph nodes in the abdomen or pelvis. Incidental CT findings: Advanced vascular disease for age. No aneurysm. SKELETON: No findings for osseous metastatic disease. Incidental CT findings: None. IMPRESSION: 1. Hypermetabolic 14 mm spiculated nodular lesion in the left upper lobe consistent with a primary lung neoplasm. 2. No findings for metastatic adenopathy or metastatic disease involving the abdomen/pelvis or bony structures. 3. Hypermetabolism in the neck as detailed above is likely due to dental disease and muscular activity. 4. Age advanced vascular disease. Aortic Atherosclerosis (ICD10-I70.0) and  Emphysema (ICD10-J43.9). Electronically Signed   By: Rudie Meyer M.D.   On: 07/26/2023 11:14     Past medical hx Past Medical History:  Diagnosis Date   Arthritis    Chronic back pain    Collagen vascular disease (HCC)    Colonic mass    History, s/p removal; per patient.   Constipation    COPD (chronic obstructive pulmonary disease) (HCC)    Coronary atherosclerosis of native coronary artery    Dense coronary atherosclerosis by chest CT November 2014    Dyspnea    when walking distance   Essential hypertension    Essential tremor    History of chicken pox    History of [place] measles    Neuropathy    Pneumonia    2015 ish   Sciatica      Social History   Tobacco Use   Smoking status: Every Day    Average packs/day: 2.3 packs/day for 53.0 years (119.3 ttl pk-yrs)    Types: Cigarettes    Start date: 08/28/1969   Smokeless tobacco: Never  Vaping Use   Vaping status: Never Used  Substance Use Topics   Alcohol use: No  Alcohol/week: 0.0 standard drinks of alcohol   Drug use: Yes    Types: Marijuana, Other-see comments    Comment: Pt uses Xanax- prn    Mr.Anthes reports that he has been smoking cigarettes. He started smoking about 53 years ago. He has a 119.3 pack-year smoking history. He has never used smokeless tobacco. He reports current drug use. Drugs: Marijuana and Other-see comments. He reports that he does not drink alcohol.  Tobacco Cessation: Current everyday smoker.  We have discussed the necessity of trying to cut down with the eventual goal of complete smoking cessation.  He verbalized understanding and stated that he would work on this. Smoking cessation counseling was done for total of 3 to 4 minutes today patient has been provided with phone numbers for free nicotine replacement therapy, hypnosis, and acupuncture all meant to assist in smoking cessation.  Past surgical hx, Family hx, Social hx all reviewed.  Current Outpatient Medications on File Prior  to Visit  Medication Sig   albuterol (VENTOLIN HFA) 108 (90 Base) MCG/ACT inhaler Inhale 2 puffs into the lungs every 4 (four) hours as needed for wheezing or shortness of breath.   amLODipine (NORVASC) 5 MG tablet Take 1 tablet (5 mg total) by mouth daily.   Budeson-Glycopyrrol-Formoterol (BREZTRI AEROSPHERE) 160-9-4.8 MCG/ACT AERO Take 2 puffs first thing in am and then another 2 puffs about 12 hours later.   GNP MELATONIN 10 MG SUBL Take 1 tablet by mouth at bedtime as needed.   ipratropium-albuterol (DUONEB) 0.5-2.5 (3) MG/3ML SOLN Take 3 mLs by nebulization every 6 (six) hours as needed (shortness of breath).   linaclotide (LINZESS) 290 MCG CAPS capsule TAKE ONE CAPSULE BY MOUTH EVERY MORNING BEFORE BREAKFAST   metoprolol succinate (TOPROL-XL) 25 MG 24 hr tablet TAKE 2 TABLETS BY MOUTH IN THE MORNING AND 1 TABLET BY MOUTH IN THE EVENING.   naloxone (NARCAN) 4 MG/0.1ML LIQD nasal spray kit Place 1 spray into the nose as needed (as directed).   nitroGLYCERIN (NITROSTAT) 0.4 MG SL tablet Place 1 tablet (0.4 mg total) under the tongue every 5 (five) minutesas needed for chest pain.   oxyCODONE ER (XTAMPZA ER) 9 MG C12A Take 1 capsule by mouth daily.   oxyCODONE-acetaminophen (PERCOCET/ROXICET) 5-325 MG tablet Take 1 tablet by mouth every 8 (eight) hours as needed for severe pain.   temazepam (RESTORIL) 30 MG capsule Take 60 mg by mouth at bedtime.   No current facility-administered medications on file prior to visit.     Allergies  Allergen Reactions   Fentanyl Other (See Comments)    Ineffective     Review Of Systems:  Constitutional:   No  weight loss, night sweats,  Fevers, chills, fatigue, or  lassitude.  HEENT:   No headaches,  Difficulty swallowing,  Tooth/dental problems, or  Sore throat,                No sneezing, itching, ear ache, nasal congestion, post nasal drip,   CV:  No chest pain,  Orthopnea, PND, swelling in lower extremities, anasarca, dizziness, palpitations,  syncope.   GI  No heartburn, indigestion, abdominal pain, nausea, vomiting, diarrhea, change in bowel habits, loss of appetite, bloody stools.   Resp: No shortness of breath with exertion or at rest.  No excess mucus, no productive cough,  No non-productive cough,  No coughing up of blood.  No change in color of mucus.  No wheezing.  No chest wall deformity  Skin: no rash or lesions.  GU:  no dysuria, change in color of urine, no urgency or frequency.  No flank pain, no hematuria   MS:  No joint pain or swelling.  No decreased range of motion.  No back pain.  Psych:  No change in mood or affect. No depression or anxiety.  No memory loss.   Vital Signs BP (!) 140/70 (BP Location: Right Arm, Cuff Size: Normal)   Pulse 67   Ht 5\' 10"  (1.778 m)   Wt 155 lb (70.3 kg)   SpO2 97%   BMI 22.24 kg/m    Physical Exam:  General- No distress,  A&Ox3, pleasant and appropriate ENT: No sinus tenderness, TM clear, pale nasal mucosa, no oral exudate,no post nasal drip, no LAN Cardiac: S1, S2, regular rate and rhythm, no murmur Chest: No wheeze/ rales/ dullness; no accessory muscle use, no nasal flaring, no sternal retractions Abd.: Soft Non-tender, nondistended, bowel sounds positive,Body mass index is 22.24 kg/m.  Ext: No clubbing cyanosis, edema Neuro:  normal strength, moving all extremities x 4, alert and oriented x 3, appropriate Skin: No rashes, warm and dry, no lesions Psych: normal mood and behavior   Assessment/Plan Hypermetabolic 14 mm spiculated nodular lesion in the left upper lobe consistent with a primary lung neoplasm. Current everyday smoker Plan As we discussed, the lung nodule we found on your screening scan was hypermetabolic on the PET scan , and we recommend a  biopsy of that nodule to determine if this is cancer.  Plan is for biopsy 08/03/2023 at 10:45 with Dr. Delton Coombes.  You will need someone to bring you to [facility], stay while you have the procedure, and drive you home,  and stay with you for the day until anesthesia has cleared.  We discussed that there is a small risk of bleeding, infection, pneumothorax and adverse effects from anesthesia. You will get a call from anesthesia for further evaluation.   Please work on quitting smoking. You can receive free nicotine replacement therapy (patches, gum, or mints) by calling 1-800-QUIT NOW. Please call so we can get you on the path to becoming a non-smoker. I know it is hard, but you can do this!  Hypnosis for smoking cessation  [employer] [phone]  Acupuncture for smoking cessation  [employer] [phone]    Good luck with your procedure. You will follow up with me in the office 1 week after the biopsy to go over results.  We will add you to my schedule for 8:30 am on 08/10/2023.  Please contact office for sooner follow up if symptoms do not improve or worsen or seek emergency care    I spent 33 minutes dedicated to the care of this patient on the date of this encounter to include pre-visit review of records, face-to-face time with the patient discussing conditions above, post visit ordering of testing, clinical documentation with the electronic health record, making appropriate referrals as documented, and communicating necessary information to the patient's healthcare team.    Bevelyn Ngo, NP 07/27/2023  9:05 AM

## 2023-07-27 NOTE — Progress Notes (Signed)
History of Present Illness Peter Campbell is a 67 y.o. male  current every day smoker with a 120 pack year smoking history followed through the screening program.   07/27/2023 Pt. Presents for follow up after PET scan to better evaluate a 14 mm spiculated nodule in the left upper lobe. The PET scan was positive for hypermetabolic activity in the 14 mm spiculated nodule lesion in the left upper lobe.  We discussed that this is concerning for lung cancer and neck step should be to biopsy this lesion to determine tissue and diagnosis.  Patient is in agreement with this plan. He has been scheduled for August 03, 2023 at 10:45 AM with Dr. Delton Coombes.  The patient understands he will need to have someone with him to stay during the procedure and to drive him home in addition to staying with him for 24 hours after to ensure no adverse events to anesthesia.  Patient is not on blood thinners, he is not diabetic, he does not have allergies to latex.  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 He has been scheduled for a 1 week follow-up on November 12 to review results of biopsy. We have reviewed that there is a small risk of bleeding, infection, pneumothorax and adverse effects from anesthesia and having this procedure.  We discussed that it is a small risk however we always reviewed this with her patients prior to having them consent.  Patient verbalized understanding and is in agreement to moving forward with the bronchoscopy with biopsies.  I have also messaged Dr. Delton Coombes to see if he needs any additional imaging done prior to the procedure.  We provided the patient with a letter with additional instructions before he left the office today.  Test Results: PET Scan 07/15/2023 Hypermetabolic 14 mm spiculated nodular lesion in the left upper lobe consistent with a primary lung neoplasm. 2. No findings for metastatic  adenopathy or metastatic disease involving the abdomen/pelvis or bony structures. 3. Hypermetabolism in the neck as detailed above is likely due to dental disease and muscular activity. 4. Age advanced vascular disease.  Lung Cancer Screening CT chest 06/17/2023 In the posterior aspect of the left upper lobe abutting the major fissure (axial image 196) there is a macrolobulated and slightly spiculated nodule which is new compared to the prior study with a volume derived mean diameter of 17.7 mm, concerning for probable primary bronchogenic neoplasm. No acute consolidative airspace disease. No pleural effusions. Diffuse bronchial wall thickening with mild to moderate centrilobular and paraseptal emphysema. New aggressive appearing left upper lobe pulmonary nodule categorized as Lung-RADS 4BS, suspicious.       Latest Ref Rng & Units 01/31/2022    5:13 AM 01/30/2022    8:44 PM 10/05/2021    5:49 AM  BMP  Glucose 70 - 99 mg/dL 96  161  95   BUN 8 - 23 mg/dL 21  20  16    Creatinine 0.61 - 1.24 mg/dL 0.96  0.45  4.09   Sodium 135 - 145 mmol/L 138  139  137   Potassium 3.5 - 5.1 mmol/L 3.4  3.8  4.2   Chloride 98 - 111 mmol/L 103  104  94   CO2 22 - 32 mmol/L 28  29  35   Calcium 8.9 - 10.3 mg/dL 9.1  9.2  9.0     BNP  Component Value Date/Time   BNP 65.0 06/19/2015 1331    ProBNP No results found for: "PROBNP"  PFT No results found for: "FEV1PRE", "FEV1POST", "FVCPRE", "FVCPOST", "TLC", "DLCOUNC", "PREFEV1FVCRT", "PSTFEV1FVCRT"  NM PET Image Initial (PI) Skull Base To Thigh  Result Date: 07/26/2023 CLINICAL DATA:  Initial treatment strategy for new left upper lobe pulmonary lesion on lung cancer screening chest CT. EXAM: NUCLEAR MEDICINE PET SKULL BASE TO THIGH TECHNIQUE: 8.5 mCi F-18 FDG was injected intravenously. Full-ring PET imaging was performed from the skull base to thigh after the radiotracer. CT data was obtained and used for attenuation correction and anatomic  localization. Fasting blood glucose: 93 mg/dl COMPARISON:  Chest CT 13/04/6577 FINDINGS: Mediastinal blood pool activity: SUV max 1.97 Liver activity: SUV max NA NECK: Marked hypermetabolism in the roof of the mouth likely related to dental disease. There is also fairly significant hypermetabolism along the left tongue base and down into the left submandibular region. This has an elongated appearance images and is most likely activity in the stylohyoid muscle. I do not see any abnormality CT scan. I do not see any obvious abnormality on the CT scan. No enlarged or hypermetabolic neck nodes. This is most likely artifact related to dental disease and muscular activity. Incidental CT findings: None. CHEST: The 14 mm spiculated nodular lesion in the left upper lobe abutting the major fissure and somewhat tenting the fissure demonstrates hypermetabolism. SUV max is 9.01 and this is consistent with a primary lung neoplasm. No enlarged or hypermetabolic mediastinal or hilar lymph nodes to suggest metastatic adenopathy. No other pulmonary lesions are identified. Incidental CT findings: Stable emphysematous changes and pulmonary scarring. Stable age advanced aortic and coronary artery calcifications. ABDOMEN/PELVIS: No abnormal hypermetabolic activity within the liver, pancreas, adrenal glands, or spleen. No hypermetabolic lymph nodes in the abdomen or pelvis. Incidental CT findings: Advanced vascular disease for age. No aneurysm. SKELETON: No findings for osseous metastatic disease. Incidental CT findings: None. IMPRESSION: 1. Hypermetabolic 14 mm spiculated nodular lesion in the left upper lobe consistent with a primary lung neoplasm. 2. No findings for metastatic adenopathy or metastatic disease involving the abdomen/pelvis or bony structures. 3. Hypermetabolism in the neck as detailed above is likely due to dental disease and muscular activity. 4. Age advanced vascular disease. Aortic Atherosclerosis (ICD10-I70.0) and  Emphysema (ICD10-J43.9). Electronically Signed   By: Rudie Meyer M.D.   On: 07/26/2023 11:14     Past medical hx Past Medical History:  Diagnosis Date   Arthritis    Chronic back pain    Collagen vascular disease (HCC)    Colonic mass    History, s/p removal; per patient.   Constipation    COPD (chronic obstructive pulmonary disease) (HCC)    Coronary atherosclerosis of native coronary artery    Dense coronary atherosclerosis by chest CT November 2014    Dyspnea    when walking distance   Essential hypertension    Essential tremor    History of chicken pox    History of [place] measles    Neuropathy    Pneumonia    2015 ish   Sciatica      Social History   Tobacco Use   Smoking status: Every Day    Average packs/day: 2.3 packs/day for 53.0 years (119.3 ttl pk-yrs)    Types: Cigarettes    Start date: 08/28/1969   Smokeless tobacco: Never  Vaping Use   Vaping status: Never Used  Substance Use Topics   Alcohol use: No  Alcohol/week: 0.0 standard drinks of alcohol   Drug use: Yes    Types: Marijuana, Other-see comments    Comment: Pt uses Xanax- prn    Mr.Anthes reports that he has been smoking cigarettes. He started smoking about 53 years ago. He has a 119.3 pack-year smoking history. He has never used smokeless tobacco. He reports current drug use. Drugs: Marijuana and Other-see comments. He reports that he does not drink alcohol.  Tobacco Cessation: Current everyday smoker.  We have discussed the necessity of trying to cut down with the eventual goal of complete smoking cessation.  He verbalized understanding and stated that he would work on this. Smoking cessation counseling was done for total of 3 to 4 minutes today patient has been provided with phone numbers for free nicotine replacement therapy, hypnosis, and acupuncture all meant to assist in smoking cessation.  Past surgical hx, Family hx, Social hx all reviewed.  Current Outpatient Medications on File Prior  to Visit  Medication Sig   albuterol (VENTOLIN HFA) 108 (90 Base) MCG/ACT inhaler Inhale 2 puffs into the lungs every 4 (four) hours as needed for wheezing or shortness of breath.   amLODipine (NORVASC) 5 MG tablet Take 1 tablet (5 mg total) by mouth daily.   Budeson-Glycopyrrol-Formoterol (BREZTRI AEROSPHERE) 160-9-4.8 MCG/ACT AERO Take 2 puffs first thing in am and then another 2 puffs about 12 hours later.   GNP MELATONIN 10 MG SUBL Take 1 tablet by mouth at bedtime as needed.   ipratropium-albuterol (DUONEB) 0.5-2.5 (3) MG/3ML SOLN Take 3 mLs by nebulization every 6 (six) hours as needed (shortness of breath).   linaclotide (LINZESS) 290 MCG CAPS capsule TAKE ONE CAPSULE BY MOUTH EVERY MORNING BEFORE BREAKFAST   metoprolol succinate (TOPROL-XL) 25 MG 24 hr tablet TAKE 2 TABLETS BY MOUTH IN THE MORNING AND 1 TABLET BY MOUTH IN THE EVENING.   naloxone (NARCAN) 4 MG/0.1ML LIQD nasal spray kit Place 1 spray into the nose as needed (as directed).   nitroGLYCERIN (NITROSTAT) 0.4 MG SL tablet Place 1 tablet (0.4 mg total) under the tongue every 5 (five) minutesas needed for chest pain.   oxyCODONE ER (XTAMPZA ER) 9 MG C12A Take 1 capsule by mouth daily.   oxyCODONE-acetaminophen (PERCOCET/ROXICET) 5-325 MG tablet Take 1 tablet by mouth every 8 (eight) hours as needed for severe pain.   temazepam (RESTORIL) 30 MG capsule Take 60 mg by mouth at bedtime.   No current facility-administered medications on file prior to visit.     Allergies  Allergen Reactions   Fentanyl Other (See Comments)    Ineffective     Review Of Systems:  Constitutional:   No  weight loss, night sweats,  Fevers, chills, fatigue, or  lassitude.  HEENT:   No headaches,  Difficulty swallowing,  Tooth/dental problems, or  Sore throat,                No sneezing, itching, ear ache, nasal congestion, post nasal drip,   CV:  No chest pain,  Orthopnea, PND, swelling in lower extremities, anasarca, dizziness, palpitations,  syncope.   GI  No heartburn, indigestion, abdominal pain, nausea, vomiting, diarrhea, change in bowel habits, loss of appetite, bloody stools.   Resp: No shortness of breath with exertion or at rest.  No excess mucus, no productive cough,  No non-productive cough,  No coughing up of blood.  No change in color of mucus.  No wheezing.  No chest wall deformity  Skin: no rash or lesions.  GU:  no dysuria, change in color of urine, no urgency or frequency.  No flank pain, no hematuria   MS:  No joint pain or swelling.  No decreased range of motion.  No back pain.  Psych:  No change in mood or affect. No depression or anxiety.  No memory loss.   Vital Signs BP (!) 140/70 (BP Location: Right Arm, Cuff Size: Normal)   Pulse 67   Ht 5\' 10"  (1.778 m)   Wt 155 lb (70.3 kg)   SpO2 97%   BMI 22.24 kg/m    Physical Exam:  General- No distress,  A&Ox3, pleasant and appropriate ENT: No sinus tenderness, TM clear, pale nasal mucosa, no oral exudate,no post nasal drip, no LAN Cardiac: S1, S2, regular rate and rhythm, no murmur Chest: No wheeze/ rales/ dullness; no accessory muscle use, no nasal flaring, no sternal retractions Abd.: Soft Non-tender, nondistended, bowel sounds positive,Body mass index is 22.24 kg/m.  Ext: No clubbing cyanosis, edema Neuro:  normal strength, moving all extremities x 4, alert and oriented x 3, appropriate Skin: No rashes, warm and dry, no lesions Psych: normal mood and behavior   Assessment/Plan Hypermetabolic 14 mm spiculated nodular lesion in the left upper lobe consistent with a primary lung neoplasm. Current everyday smoker Plan As we discussed, the lung nodule we found on your screening scan was hypermetabolic on the PET scan , and we recommend a  biopsy of that nodule to determine if this is cancer.  Plan is for biopsy 08/03/2023 at 10:45 with Dr. Delton Coombes.  You will need someone to bring you to [facility], stay while you have the procedure, and drive you home,  and stay with you for the day until anesthesia has cleared.  We discussed that there is a small risk of bleeding, infection, pneumothorax and adverse effects from anesthesia. You will get a call from anesthesia for further evaluation.   Please work on quitting smoking. You can receive free nicotine replacement therapy (patches, gum, or mints) by calling 1-800-QUIT NOW. Please call so we can get you on the path to becoming a non-smoker. I know it is hard, but you can do this!  Hypnosis for smoking cessation  [employer] [phone]  Acupuncture for smoking cessation  [employer] [phone]    Good luck with your procedure. You will follow up with me in the office 1 week after the biopsy to go over results.  We will add you to my schedule for 8:30 am on 08/10/2023.  Please contact office for sooner follow up if symptoms do not improve or worsen or seek emergency care    I spent 33 minutes dedicated to the care of this patient on the date of this encounter to include pre-visit review of records, face-to-face time with the patient discussing conditions above, post visit ordering of testing, clinical documentation with the electronic health record, making appropriate referrals as documented, and communicating necessary information to the patient's healthcare team.    Bevelyn Ngo, NP 07/27/2023  9:05 AM

## 2023-07-29 ENCOUNTER — Ambulatory Visit: Payer: Medicare Other | Admitting: Cardiology

## 2023-07-30 DIAGNOSIS — C3490 Malignant neoplasm of unspecified part of unspecified bronchus or lung: Secondary | ICD-10-CM

## 2023-07-30 HISTORY — DX: Malignant neoplasm of unspecified part of unspecified bronchus or lung: C34.90

## 2023-08-02 ENCOUNTER — Other Ambulatory Visit: Payer: Self-pay

## 2023-08-02 ENCOUNTER — Encounter (HOSPITAL_COMMUNITY): Payer: Self-pay | Admitting: Emergency Medicine

## 2023-08-02 NOTE — Progress Notes (Signed)
[name] denies chest pain, reports that he gets short of breath with activity.  Patient denies having any s/s of Covid in her household, also denies any known exposure to Covid. [name] denies  any s/s of upper or lower respiratory infection in the past 8 weeks.   [name] PCP is Dr. Alvina Filbert, cardiologist is Dr. Ival Bible, pulmonologist is Dr. Sandrea Hughs.

## 2023-08-03 ENCOUNTER — Ambulatory Visit (HOSPITAL_COMMUNITY): Payer: Medicare Other | Admitting: Registered Nurse

## 2023-08-03 ENCOUNTER — Ambulatory Visit (HOSPITAL_BASED_OUTPATIENT_CLINIC_OR_DEPARTMENT_OTHER): Payer: Medicare Other | Admitting: Registered Nurse

## 2023-08-03 ENCOUNTER — Ambulatory Visit (HOSPITAL_COMMUNITY): Payer: Medicare Other

## 2023-08-03 ENCOUNTER — Encounter (HOSPITAL_COMMUNITY): Payer: Self-pay | Admitting: Emergency Medicine

## 2023-08-03 ENCOUNTER — Encounter (HOSPITAL_COMMUNITY)
Admission: RE | Disposition: A | Discharge status: Home / Self Care | Payer: Self-pay | Source: Home / Self Care | Attending: Emergency Medicine

## 2023-08-03 ENCOUNTER — Ambulatory Visit (HOSPITAL_COMMUNITY)
Admission: RE | Admit: 2023-08-03 | Discharge: 2023-08-03 | Disposition: A | Discharge status: Home / Self Care | Payer: Medicare Other | Attending: Emergency Medicine | Admitting: Emergency Medicine

## 2023-08-03 DIAGNOSIS — I2510 Atherosclerotic heart disease of native coronary artery without angina pectoris: Secondary | ICD-10-CM | POA: Insufficient documentation

## 2023-08-03 DIAGNOSIS — R911 Solitary pulmonary nodule: Secondary | ICD-10-CM | POA: Diagnosis present

## 2023-08-03 DIAGNOSIS — J449 Chronic obstructive pulmonary disease, unspecified: Secondary | ICD-10-CM | POA: Insufficient documentation

## 2023-08-03 DIAGNOSIS — I10 Essential (primary) hypertension: Secondary | ICD-10-CM | POA: Insufficient documentation

## 2023-08-03 DIAGNOSIS — M549 Dorsalgia, unspecified: Secondary | ICD-10-CM | POA: Insufficient documentation

## 2023-08-03 DIAGNOSIS — G8929 Other chronic pain: Secondary | ICD-10-CM | POA: Insufficient documentation

## 2023-08-03 DIAGNOSIS — F1290 Cannabis use, unspecified, uncomplicated: Secondary | ICD-10-CM | POA: Insufficient documentation

## 2023-08-03 DIAGNOSIS — F17200 Nicotine dependence, unspecified, uncomplicated: Secondary | ICD-10-CM | POA: Insufficient documentation

## 2023-08-03 DIAGNOSIS — K219 Gastro-esophageal reflux disease without esophagitis: Secondary | ICD-10-CM | POA: Diagnosis not present

## 2023-08-03 DIAGNOSIS — C3412 Malignant neoplasm of upper lobe, left bronchus or lung: Secondary | ICD-10-CM | POA: Insufficient documentation

## 2023-08-03 HISTORY — PX: BRONCHIAL NEEDLE ASPIRATION BIOPSY: SHX5106

## 2023-08-03 HISTORY — DX: Anxiety disorder, unspecified: F41.9

## 2023-08-03 HISTORY — PX: FIDUCIAL MARKER PLACEMENT: SHX6858

## 2023-08-03 HISTORY — PX: BRONCHIAL BRUSHINGS: SHX5108

## 2023-08-03 HISTORY — PX: BRONCHIAL BIOPSY: SHX5109

## 2023-08-03 HISTORY — DX: Insomnia, unspecified: G47.00

## 2023-08-03 HISTORY — DX: Unspecified cataract: H26.9

## 2023-08-03 LAB — CBC
HCT: 47.3 % (ref 39.0–52.0)
Hemoglobin: 16.1 g/dL (ref 13.0–17.0)
MCH: 31.7 pg (ref 26.0–34.0)
MCHC: 34 g/dL (ref 30.0–36.0)
MCV: 93.1 fL (ref 80.0–100.0)
Platelets: 226 10*3/uL (ref 150–400)
RBC: 5.08 MIL/uL (ref 4.22–5.81)
RDW: 12.9 % (ref 11.5–15.5)
WBC: 10.9 10*3/uL — ABNORMAL HIGH (ref 4.0–10.5)
nRBC: 0 % (ref 0.0–0.2)

## 2023-08-03 LAB — POCT I-STAT, CHEM 8
BUN: 10 mg/dL (ref 8–23)
Calcium, Ion: 1.02 mmol/L — ABNORMAL LOW (ref 1.15–1.40)
Chloride: 105 mmol/L (ref 98–111)
Creatinine, Ser: 0.7 mg/dL (ref 0.61–1.24)
Glucose, Bld: 104 mg/dL — ABNORMAL HIGH (ref 70–99)
HCT: 45 % (ref 39.0–52.0)
Hemoglobin: 15.3 g/dL (ref 13.0–17.0)
Potassium: 3.8 mmol/L (ref 3.5–5.1)
Sodium: 139 mmol/L (ref 135–145)
TCO2: 24 mmol/L (ref 22–32)

## 2023-08-03 SURGERY — BRONCHOSCOPY, WITH BIOPSY USING ELECTROMAGNETIC NAVIGATION
Anesthesia: General | Laterality: Left

## 2023-08-03 MED ORDER — ROCURONIUM BROMIDE 10 MG/ML (PF) SYRINGE
PREFILLED_SYRINGE | INTRAVENOUS | Status: DC | PRN
  Administered 2023-08-03: 50 mg via INTRAVENOUS

## 2023-08-03 MED ORDER — SODIUM CHLORIDE 0.9 % IV SOLN: INTRAVENOUS | Status: DC | PRN

## 2023-08-03 MED ORDER — SUGAMMADEX SODIUM 200 MG/2ML IV SOLN
INTRAVENOUS | Status: DC | PRN
  Administered 2023-08-03: 200 mg via INTRAVENOUS

## 2023-08-03 MED ORDER — PROPOFOL 500 MG/50ML IV EMUL
INTRAVENOUS | Status: DC | PRN
  Administered 2023-08-03: 150 ug/kg/min via INTRAVENOUS

## 2023-08-03 MED ORDER — LIDOCAINE 2% (20 MG/ML) 5 ML SYRINGE
INTRAMUSCULAR | Status: DC | PRN
  Administered 2023-08-03: 60 mg via INTRAVENOUS

## 2023-08-03 MED ORDER — FENTANYL CITRATE (PF) 100 MCG/2ML IJ SOLN
INTRAMUSCULAR | Status: AC
  Filled 2023-08-03: qty 2

## 2023-08-03 MED ORDER — DEXAMETHASONE SODIUM PHOSPHATE 10 MG/ML IJ SOLN
INTRAMUSCULAR | Status: DC | PRN
  Administered 2023-08-03: 10 mg via INTRAVENOUS

## 2023-08-03 MED ORDER — PROPOFOL 10 MG/ML IV BOLUS
INTRAVENOUS | Status: DC | PRN
  Administered 2023-08-03: 100 mg via INTRAVENOUS

## 2023-08-03 MED ORDER — PROPOFOL 1000 MG/100ML IV EMUL
INTRAVENOUS | Status: AC
  Filled 2023-08-03: qty 200

## 2023-08-03 MED ORDER — CHLORHEXIDINE GLUCONATE 0.12 % MT SOLN
OROMUCOSAL | Status: AC
  Administered 2023-08-03: 15 mL
  Filled 2023-08-03: qty 15

## 2023-08-03 MED ORDER — ONDANSETRON HCL 4 MG/2ML IJ SOLN
INTRAMUSCULAR | Status: DC | PRN
  Administered 2023-08-03: 4 mg via INTRAVENOUS

## 2023-08-03 MED ORDER — SODIUM CHLORIDE 0.9% FLUSH: 10.0000 mL | Freq: Two times a day (BID) | INTRAVENOUS | Status: DC

## 2023-08-03 SURGICAL SUPPLY — 1 items: superlock fiducial marker IMPLANT

## 2023-08-03 NOTE — Transfer of Care (Signed)
Immediate Anesthesia Transfer of Care Note  Patient: Peter Campbell  Procedure(s) Performed: ROBOTIC ASSISTED NAVIGATIONAL BRONCHOSCOPY (Left) BRONCHIAL BIOPSIES BRONCHIAL NEEDLE ASPIRATION BIOPSIES BRONCHIAL BRUSHINGS FIDUCIAL MARKER PLACEMENT  Patient Location: PACU  Anesthesia Type:General  Level of Consciousness: awake and oriented  Airway & Oxygen Therapy: Patient Spontanous Breathing  Post-op Assessment: Report given to RN and Post -op Vital signs reviewed and stable  Post vital signs: Reviewed and stable  Last Vitals:  Vitals Value Taken Time  BP 74/66 08/03/23 1203  Temp    Pulse 83 08/03/23 1205  Resp 17 08/03/23 1205  SpO2 92 % 08/03/23 1205  Vitals shown include unfiled device data.  Last Pain:  Vitals:   08/03/23 0750  PainSc: 6          Complications: No notable events documented.

## 2023-08-03 NOTE — Anesthesia Postprocedure Evaluation (Signed)
Anesthesia Post Note  Patient: Peter Campbell  Procedure(s) Performed: ROBOTIC ASSISTED NAVIGATIONAL BRONCHOSCOPY (Left) BRONCHIAL BIOPSIES BRONCHIAL NEEDLE ASPIRATION BIOPSIES BRONCHIAL BRUSHINGS FIDUCIAL MARKER PLACEMENT     Patient location during evaluation: PACU Anesthesia Type: General Level of consciousness: awake and alert Pain management: pain level controlled Vital Signs Assessment: post-procedure vital signs reviewed and stable Respiratory status: spontaneous breathing, nonlabored ventilation, respiratory function stable and patient connected to nasal cannula oxygen Cardiovascular status: blood pressure returned to baseline and stable Postop Assessment: no apparent nausea or vomiting Anesthetic complications: no  No notable events documented.  Last Vitals:  Vitals:   08/03/23 1215 08/03/23 1230  BP: 101/68 107/76  Pulse: 78 79  Resp: 13 20  Temp:  37.1 C  SpO2: 92% 93%    Last Pain:  Vitals:   08/03/23 1230  PainSc: 0-No pain                 Kennieth Rad

## 2023-08-03 NOTE — Anesthesia Preprocedure Evaluation (Signed)
Anesthesia Evaluation  Patient identified by MRN, date of birth, ID band Patient awake    Reviewed: Allergy & Precautions, NPO status , Patient's Chart, lab work & pertinent test results  Airway Mallampati: II  TM Distance: >3 FB Neck ROM: Full    Dental  (+) Dental Advisory Given   Pulmonary COPD, Current Smoker   breath sounds clear to auscultation       Cardiovascular hypertension, Pt. on medications and Pt. on home beta blockers + CAD   Rhythm:Regular Rate:Normal     Neuro/Psych  Neuromuscular disease    GI/Hepatic Neg liver ROS,GERD  ,,  Endo/Other  negative endocrine ROS    Renal/GU negative Renal ROS     Musculoskeletal  (+) Arthritis ,    Abdominal   Peds  Hematology negative hematology ROS (+)   Anesthesia Other Findings   Reproductive/Obstetrics                             Anesthesia Physical Anesthesia Plan  ASA: 3  Anesthesia Plan: General   Post-op Pain Management:    Induction: Intravenous  PONV Risk Score and Plan: 1 and Dexamethasone, Ondansetron, Propofol infusion, TIVA and Treatment may vary due to age or medical condition  Airway Management Planned: Oral ETT  Additional Equipment: None  Intra-op Plan:   Post-operative Plan: Extubation in OR  Informed Consent: I have reviewed the patients History and Physical, chart, labs and discussed the procedure including the risks, benefits and alternatives for the proposed anesthesia with the patient or authorized representative who has indicated his/her understanding and acceptance.     Dental advisory given  Plan Discussed with: CRNA  Anesthesia Plan Comments:        Anesthesia Quick Evaluation

## 2023-08-03 NOTE — Op Note (Signed)
Video Bronchoscopy with Robotic Assisted Bronchoscopic Navigation   Date of Operation: 08/03/2023   Pre-op Diagnosis: Left upper lobe pulmonary nodule  Post-op Diagnosis: Same  Surgeon: Levy Pupa  Assistants: None  Anesthesia: General endotracheal anesthesia  Operation: Flexible video fiberoptic bronchoscopy with robotic assistance and biopsies.  Estimated Blood Loss: Minimal  Complications: None  Indications and History: Peter Campbell is a 67 y.o. male with history of tobacco use.  He was found to have a new spiculated left upper lobe pulmonary nodule on lung cancer screening CT chest.  Recommendation made to achieve a tissue diagnosis via robotic assisted navigational bronchoscopy with biopsies. The risks, benefits, complications, treatment options and expected outcomes were discussed with the patient.  The possibilities of pneumothorax, pneumonia, reaction to medication, pulmonary aspiration, perforation of a viscus, bleeding, failure to diagnose a condition and creating a complication requiring transfusion or operation were discussed with the patient who freely signed the consent.    Description of Procedure: The patient was seen in the Preoperative Area, was examined and was deemed appropriate to proceed.  The patient was taken to [facility] endoscopy room 3, identified as Peter Campbell and the procedure verified as Flexible Video Fiberoptic Bronchoscopy.  A Time Out was held and the above information confirmed.   Prior to the date of the procedure a high-resolution CT scan of the chest was performed. Utilizing ION software program a virtual tracheobronchial tree was generated to allow the creation of distinct navigation pathways to the patient's parenchymal abnormalities. After being taken to the operating room general anesthesia was initiated and the patient  was orally intubated. The video fiberoptic bronchoscope was introduced via the endotracheal tube and a general inspection was  performed which showed normal right and left lung anatomy. Aspiration of the bilateral mainstems was completed to remove any remaining secretions. Robotic catheter inserted into patient's endotracheal tube.   Target #1 left upper lobe pulmonary nodule: The distinct navigation pathways prepared prior to this procedure were then utilized to navigate to patient's lesion identified on CT scan. The robotic catheter was secured into place and the vision probe was withdrawn.  Lesion location was approximated using fluoroscopy.  Local registration and targeting was performed using Cios three-dimensional imaging. Under fluoroscopic guidance transbronchial needle brushings, transbronchial needle biopsies, and transbronchial forceps biopsies were performed to be sent for cytology and pathology.  Under fluoroscopic guidance a single fiducial marker was placed adjacent to the nodule.  At the end of the procedure a general airway inspection was performed and there was no evidence of active bleeding. The bronchoscope was removed.  The patient tolerated the procedure well. There was no significant blood loss and there were no obvious complications. A post-procedural chest x-ray is pending.  Samples Target #1: 1. Transbronchial needle brushings from left upper lobe nodule 2. Transbronchial Wang needle biopsies from left upper lobe nodule 3. Transbronchial forceps biopsies from left upper lobe nodule    Plans:  The patient will be discharged from the PACU to home when recovered from anesthesia and after chest x-ray is reviewed. We will review the cytology, pathology and microbiology results with the patient when they become available. Outpatient followup will be with Saralyn Pilar, NP.    Levy Pupa, MD, PhD 08/03/2023, 11:57 AM [facility] [phone] or if no answer before 7:00PM call [phone] For any issues after 7:00PM please call eLink [phone]

## 2023-08-03 NOTE — Interval H&P Note (Signed)
History and Physical Interval Note:  08/03/2023 8:53 AM  Peter Campbell  has presented today for surgery, with the diagnosis of LEFT LUNG NODULE.  The various methods of treatment have been discussed with the patient and family. After consideration of risks, benefits and other options for treatment, the patient has consented to  Procedure(s): ROBOTIC ASSISTED NAVIGATIONAL BRONCHOSCOPY (Left) as a surgical intervention.  The patient's history has been reviewed, patient examined, no change in status, stable for surgery.  I have reviewed the patient's chart and labs.  Questions were answered to the patient's satisfaction.     Leslye Peer

## 2023-08-03 NOTE — Discharge Instructions (Addendum)
Flexible Bronchoscopy, Care After This sheet gives you information about how to care for yourself after your test. Your doctor may also give you more specific instructions. If you have problems or questions, contact your doctor. Follow these instructions at home: Eating and drinking When your numbness is gone and your cough and gag reflexes have come back, you may: Eat only soft foods. Slowly drink liquids. The day after the test, go back to your normal diet. Driving Do not drive for 24 hours if you were given a medicine to help you relax (sedative). Do not drive or use heavy machinery while taking prescription pain medicine. General instructions  Take over-the-counter and prescription medicines only as told by your doctor. Return to your normal activities as told. Ask what activities are safe for you. Do not use any products that have nicotine or tobacco in them. This includes cigarettes and e-cigarettes. If you need help quitting, ask your doctor. Keep all follow-up visits as told by your doctor. This is important. It is very important if you had a tissue sample (biopsy) taken. Get help right away if: You have shortness of breath that gets worse. You get light-headed. You feel like you are going to pass out (faint). You have chest pain. You cough up: More than a little blood. More blood than before. Summary Do not eat or drink anything (not even water) for 2 hours after your test, or until your numbing medicine wears off. Do not use cigarettes. Do not use e-cigarettes. Get help right away if you have chest pain.  Please call our office for any questions or concerns.  [phone].  This information is not intended to replace advice given to you by your health care provider. Make sure you discuss any questions you have with your health care provider. Document Released: 07/12/2009 Document Revised: 08/27/2017 Document Reviewed: 10/02/2016 Elsevier Patient Education  2020 [employer]  

## 2023-08-03 NOTE — Anesthesia Procedure Notes (Signed)
Procedure Name: Intubation Date/Time: 08/03/2023 11:18 AM  Performed by: Marquis Buggy, CRNAPre-anesthesia Checklist: Patient identified, Emergency Drugs available, Suction available and Patient being monitored Patient Re-evaluated:Patient Re-evaluated prior to induction Oxygen Delivery Method: Non-rebreather mask Preoxygenation: Pre-oxygenation with 100% oxygen Induction Type: IV induction Ventilation: Mask ventilation without difficulty and Oral airway inserted - appropriate to patient size Laryngoscope Size: Mac and 4 Grade View: Grade I Tube type: Oral Tube size: 7.5 mm Number of attempts: 1 Placement Confirmation: ETT inserted through vocal cords under direct vision, CO2 detector, positive ETCO2 and breath sounds checked- equal and bilateral Secured at: 23 cm Tube secured with: Tape Dental Injury: Teeth and Oropharynx as per pre-operative assessment

## 2023-08-04 ENCOUNTER — Encounter (HOSPITAL_COMMUNITY): Payer: Self-pay | Admitting: Emergency Medicine

## 2023-08-09 LAB — CYTOLOGY - NON PAP

## 2023-08-10 ENCOUNTER — Ambulatory Visit (INDEPENDENT_AMBULATORY_CARE_PROVIDER_SITE_OTHER): Payer: Medicare Other | Admitting: Acute Care

## 2023-08-10 ENCOUNTER — Encounter: Payer: Self-pay | Admitting: Acute Care

## 2023-08-10 ENCOUNTER — Ambulatory Visit: Payer: Medicare Other | Admitting: Acute Care

## 2023-08-10 VITALS — BP 160/74 | HR 72 | Ht 69.0 in | Wt 155.4 lb

## 2023-08-10 DIAGNOSIS — F17210 Nicotine dependence, cigarettes, uncomplicated: Secondary | ICD-10-CM

## 2023-08-10 DIAGNOSIS — C3411 Malignant neoplasm of upper lobe, right bronchus or lung: Secondary | ICD-10-CM

## 2023-08-10 DIAGNOSIS — Z87891 Personal history of nicotine dependence: Secondary | ICD-10-CM

## 2023-08-10 DIAGNOSIS — R030 Elevated blood-pressure reading, without diagnosis of hypertension: Secondary | ICD-10-CM | POA: Diagnosis not present

## 2023-08-10 DIAGNOSIS — C3490 Malignant neoplasm of unspecified part of unspecified bronchus or lung: Secondary | ICD-10-CM

## 2023-08-10 NOTE — Progress Notes (Signed)
History of Present Illness Peter Campbell is a 67 y.o. male male  current every day smoker with a 120 pack year smoking history followed through the screening program and by Dr. Delton Coombes for an abnormal pulmonary nodule.  Synopsis 67 year old male current every day smoker with a 120 pack year smoking history followed through the screening program . Patient's 05/2023 screening scan was abnormal. There was a 14 mm spiculated nodule in the left upper lobe.  This was followed by a PET scan which confirmed hypermetabolic activity in the 14 mm spiculated nodule. He underwent Flexible video fiberoptic bronchoscopy with robotic assistance and biopsies on 08/03/2023.    08/10/2023 Pt. Presents for follow up after Flexible video fiberoptic bronchoscopy with robotic assistance and biopsies on 08/03/2023. He states he did well after the procedure. No significant bleeding fever, discolored secretions, dyspnea or adverse reaction to anesthesia. We have reviewed the results of the cytology/biopsy results.  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  We discussed that this is a non-small cell lung cancer.  We discussed options for treatment to include surgery, radiation, and medical oncology if needed.  Patient would like to see if he is a surgical candidate.  He is a current smoker so I am concerned he may not be.  Plan is for PFTs as soon as possible and if he is a surgical candidate we will have him follow-up with thoracic surgery. If PFTs show patient is not a surgical candidate he will be referred to radiation oncology. We discussed that the patient needs to quit smoking.  He is a current every day smoker.  We discussed the use of nicotine replacement therapy as well as the option of hypnosis and acupuncture.  Patient verbalized that he is going to try and work hard on quitting.  He understands that it is possible that thoracic surgery can refuse to take him to the OR if he does not quit  smoking. Referral has been placed to thoracic surgery, pulmonary function test has been ordered, CT of the brain with and without contrast has also been ordered.  Patient has a rod in his back and I was unsure if MRI was an option. We discussed continued use of his Breztri 2 puffs twice daily for his COPD. Blood pressure was elevated today in the office.  I suspect it may be related to the fact that he was learning the results of his biopsy today.  I have asked him to follow-up with his primary care doctor regarding potential hypertension.   Test Results: 08/03/2023 Cytology 08/03/2023 A. LUNG, LUL, BRUSHING:  - Lung adenocarcinoma, see comment   B. LUNG, LUL, FINE NEEDLE ASPIRATION  BIOPSY:  - Lung adenocarcinoma, see comment    PET Scan 07/15/2023 Hypermetabolic 14 mm spiculated nodular lesion in the left upper lobe consistent with a primary lung neoplasm. 2. No findings for metastatic adenopathy or metastatic disease involving the abdomen/pelvis or bony structures. 3. Hypermetabolism in the neck as detailed above is likely due to dental disease and muscular activity. 4. Age advanced vascular disease.   Lung Cancer Screening CT chest 06/17/2023 In the posterior aspect of the left upper lobe abutting the major fissure (axial image 196) there is a macrolobulated and slightly spiculated nodule which is new compared to the prior study with a volume derived mean diameter of 17.7 mm, concerning for probable primary bronchogenic neoplasm. No acute consolidative airspace disease. No pleural effusions. Diffuse bronchial wall thickening with  mild to moderate centrilobular and paraseptal emphysema. New aggressive appearing left upper lobe pulmonary nodule categorized as Lung-RADS 4BS, suspicious.        Latest Ref Rng & Units 08/03/2023    9:41 AM 08/03/2023    8:21 AM 09/14/2022    2:33 PM  CBC  WBC 4.0 - 10.5 K/uL  10.9  8.3   Hemoglobin 13.0 - 17.0 g/dL 41.3  24.4  01.0    Hematocrit 39.0 - 52.0 % 45.0  47.3  46.5   Platelets 150 - 400 K/uL  226  226        Latest Ref Rng & Units 08/03/2023    9:41 AM 01/31/2022    5:13 AM 01/30/2022    8:44 PM  BMP  Glucose 70 - 99 mg/dL 272  96  536   BUN 8 - 23 mg/dL 10  21  20    Creatinine 0.61 - 1.24 mg/dL 6.44  0.34  7.42   Sodium 135 - 145 mmol/L 139  138  139   Potassium 3.5 - 5.1 mmol/L 3.8  3.4  3.8   Chloride 98 - 111 mmol/L 105  103  104   CO2 22 - 32 mmol/L  28  29   Calcium 8.9 - 10.3 mg/dL  9.1  9.2     BNP    Component Value Date/Time   BNP 65.0 06/19/2015 1331    ProBNP No results found for: "PROBNP"  PFT No results found for: "FEV1PRE", "FEV1POST", "FVCPRE", "FVCPOST", "TLC", "DLCOUNC", "PREFEV1FVCRT", "PSTFEV1FVCRT"  DG Chest Port 1 View  Result Date: 08/03/2023 CLINICAL DATA:  Status post bronchoscopy and biopsy, productive cough EXAM: PORTABLE CHEST 1 VIEW COMPARISON:  07/15/2023 FINDINGS: Single frontal view of the chest demonstrates an unremarkable cardiac silhouette. Fiduciary marker is placed at the site of the known left upper lobe nodule. Mild airspace disease surrounding the marker may reflect sequela of bronchoscopic biopsy or lavage. No other consolidation, effusion, or pneumothorax. No acute bony abnormalities. IMPRESSION: 1. Minimal airspace disease surrounding the known left upper lobe nodule, which could reflect changes related to bronchoscopic biopsy or lavage. Fiduciary marker at the site of nodule. 2. No effusion or pneumothorax. Electronically Signed   By: Sharlet Salina M.D.   On: 08/03/2023 15:05   DG C-ARM BRONCHOSCOPY  Result Date: 08/03/2023 C-ARM BRONCHOSCOPY: Fluoroscopy was utilized by the requesting physician.  No radiographic interpretation.   NM PET Image Initial (PI) Skull Base To Thigh  Result Date: 07/26/2023 CLINICAL DATA:  Initial treatment strategy for new left upper lobe pulmonary lesion on lung cancer screening chest CT. EXAM: NUCLEAR MEDICINE PET SKULL  BASE TO THIGH TECHNIQUE: 8.5 mCi F-18 FDG was injected intravenously. Full-ring PET imaging was performed from the skull base to thigh after the radiotracer. CT data was obtained and used for attenuation correction and anatomic localization. Fasting blood glucose: 93 mg/dl COMPARISON:  Chest CT 59/56/3875 FINDINGS: Mediastinal blood pool activity: SUV max 1.97 Liver activity: SUV max NA NECK: Marked hypermetabolism in the roof of the mouth likely related to dental disease. There is also fairly significant hypermetabolism along the left tongue base and down into the left submandibular region. This has an elongated appearance images and is most likely activity in the stylohyoid muscle. I do not see any abnormality CT scan. I do not see any obvious abnormality on the CT scan. No enlarged or hypermetabolic neck nodes. This is most likely artifact related to dental disease and muscular activity. Incidental CT findings: None. CHEST:  The 14 mm spiculated nodular lesion in the left upper lobe abutting the major fissure and somewhat tenting the fissure demonstrates hypermetabolism. SUV max is 9.01 and this is consistent with a primary lung neoplasm. No enlarged or hypermetabolic mediastinal or hilar lymph nodes to suggest metastatic adenopathy. No other pulmonary lesions are identified. Incidental CT findings: Stable emphysematous changes and pulmonary scarring. Stable age advanced aortic and coronary artery calcifications. ABDOMEN/PELVIS: No abnormal hypermetabolic activity within the liver, pancreas, adrenal glands, or spleen. No hypermetabolic lymph nodes in the abdomen or pelvis. Incidental CT findings: Advanced vascular disease for age. No aneurysm. SKELETON: No findings for osseous metastatic disease. Incidental CT findings: None. IMPRESSION: 1. Hypermetabolic 14 mm spiculated nodular lesion in the left upper lobe consistent with a primary lung neoplasm. 2. No findings for metastatic adenopathy or metastatic disease  involving the abdomen/pelvis or bony structures. 3. Hypermetabolism in the neck as detailed above is likely due to dental disease and muscular activity. 4. Age advanced vascular disease. Aortic Atherosclerosis (ICD10-I70.0) and Emphysema (ICD10-J43.9). Electronically Signed   By: Rudie Meyer M.D.   On: 07/26/2023 11:14     Past medical hx Past Medical History:  Diagnosis Date   Anxiety    Arthritis    Cataract    Chronic back pain    Collagen vascular disease (HCC)    Colonic mass    History, s/p removal; per patient.   Constipation    COPD (chronic obstructive pulmonary disease) (HCC)    Coronary atherosclerosis of native coronary artery    Dense coronary atherosclerosis by chest CT November 2014    Dyspnea    when walking distance   Essential hypertension    Essential tremor    both hands R>L, "with pain sometimes all over."- per patient.   History of chicken pox    History of [place] measles    Insomnia    Neuropathy    Pneumonia    2015 ish   Sciatica      Social History   Tobacco Use   Smoking status: Every Day    Average packs/day: 2.3 packs/day for 53.0 years (119.3 ttl pk-yrs)    Types: Cigarettes    Start date: 08/28/1969   Smokeless tobacco: Never  Vaping Use   Vaping status: Never Used  Substance Use Topics   Alcohol use: No    Alcohol/week: 0.0 standard drinks of alcohol   Drug use: Yes    Frequency: 7.0 times per week    Types: Marijuana, Other-see comments    Comment: 08/02/2023    Mr.Betters reports that he has been smoking cigarettes. He started smoking about 53 years ago. He has a 119.3 pack-year smoking history. He has never used smokeless tobacco. He reports current drug use. Frequency: 7.00 times per week. Drugs: Marijuana and Other-see comments. He reports that he does not drink alcohol.  Tobacco Cessation: Patient counseled 3 to 4 minutes on smoking cessation. Resources to help supplied in the after visit summary.  Past surgical hx, Family hx,  Social hx all reviewed.  Current Outpatient Medications on File Prior to Visit  Medication Sig   albuterol (VENTOLIN HFA) 108 (90 Base) MCG/ACT inhaler Inhale 2 puffs into the lungs every 4 (four) hours as needed for wheezing or shortness of breath.   amLODipine (NORVASC) 5 MG tablet Take 1 tablet (5 mg total) by mouth daily.   Budeson-Glycopyrrol-Formoterol (BREZTRI AEROSPHERE) 160-9-4.8 MCG/ACT AERO Take 2 puffs first thing in am and then another 2 puffs about 12  hours later.   cholecalciferol (VITAMIN D3) 25 MCG (1000 UNIT) tablet Take 5,000 Units by mouth daily.   GNP MELATONIN 10 MG SUBL Take 1 tablet by mouth at bedtime as needed.   ipratropium-albuterol (DUONEB) 0.5-2.5 (3) MG/3ML SOLN Take 3 mLs by nebulization every 6 (six) hours as needed (shortness of breath).   linaclotide (LINZESS) 290 MCG CAPS capsule TAKE ONE CAPSULE BY MOUTH EVERY MORNING BEFORE BREAKFAST   metoprolol succinate (TOPROL-XL) 25 MG 24 hr tablet TAKE 2 TABLETS BY MOUTH IN THE MORNING AND 1 TABLET BY MOUTH IN THE EVENING.   naloxone (NARCAN) 4 MG/0.1ML LIQD nasal spray kit Place 1 spray into the nose as needed (as directed).   nitroGLYCERIN (NITROSTAT) 0.4 MG SL tablet Place 1 tablet (0.4 mg total) under the tongue every 5 (five) minutesas needed for chest pain.   oxyCODONE ER (XTAMPZA ER) 9 MG C12A Take 2 capsules by mouth daily.   oxyCODONE-acetaminophen (PERCOCET/ROXICET) 5-325 MG tablet Take 1 tablet by mouth every 8 (eight) hours as needed for severe pain.   temazepam (RESTORIL) 30 MG capsule Take 60 mg by mouth at bedtime.   No current facility-administered medications on file prior to visit.     Allergies  Allergen Reactions   Fentanyl Other (See Comments)    Ineffective     Review Of Systems:  Constitutional:   No  weight loss, night sweats,  Fevers, chills, fatigue, or  lassitude.  HEENT:   No headaches,  Difficulty swallowing,  Tooth/dental problems, or  Sore throat,                No sneezing,  itching, ear ache, nasal congestion, post nasal drip,   CV:  No chest pain,  Orthopnea, PND, swelling in lower extremities, anasarca, dizziness, palpitations, syncope.   GI  No heartburn, indigestion, abdominal pain, nausea, vomiting, diarrhea, change in bowel habits, loss of appetite, bloody stools.   Resp: No shortness of breath with exertion or at rest.  No excess mucus, no productive cough,  No non-productive cough,  No coughing up of blood.  No change in color of mucus.  No wheezing.  No chest wall deformity  Skin: no rash or lesions.  GU: no dysuria, change in color of urine, no urgency or frequency.  No flank pain, no hematuria   MS:  No joint pain or swelling.  No decreased range of motion.  No back pain.  Psych:  No change in mood or affect. No depression or anxiety.  No memory loss.   Vital Signs BP (!) 160/74   Pulse 72   Ht 5\' 9"  (1.753 m)   Wt 155 lb 6.4 oz (70.5 kg)   SpO2 95%   BMI 22.95 kg/m    Physical Exam:  General- No distress,  A&Ox3 ENT: No sinus tenderness, TM clear, pale nasal mucosa, no oral exudate,no post nasal drip, no LAN Cardiac: S1, S2, regular rate and rhythm, no murmur Chest: No wheeze/ rales/ dullness; no accessory muscle use, no nasal flaring, no sternal retractions Abd.: Soft Non-tender Ext: No clubbing cyanosis, edema Neuro:  normal strength Skin: No rashes, warm and dry Psych: normal mood and behavior   Assessment/Plan  Adenocarcinoma of the lung Current every day smoker  Plan Referral to Thoracic Surgery Dr. Dorris Fetch I have ordered PFT's  You will get a call to get this scheduled.  Follow up Televisit with me after PFT's have been done to review the results. If PFT's indicate you are not a  surgical candidate, we will refer to Radiation oncology Please work on quitting smoking.  You can receive free nicotine replacement therapy (patches, gum, or mints) by calling 1-800-QUIT NOW. Please call so we can get you on the path to  becoming a non-smoker. I know it is hard, but you can do this!  Hypnosis for smoking cessation  [employer] [phone]  Acupuncture for smoking cessation  [employer] [phone]    Follow-up with PCP regarding elevated blood pressure today in the office.  I spent 25 minutes dedicated to the care of this patient on the date of this encounter to include pre-visit review of records, face-to-face time with the patient discussing conditions above, post visit ordering of testing, clinical documentation with the electronic health record, making appropriate referrals as documented, and communicating necessary information to the patient's healthcare team.   Bevelyn Ngo, NP 08/10/2023  9:09 AM

## 2023-08-10 NOTE — Patient Instructions (Addendum)
It is good to see you today. I am glad you have done well since the procedure.  Referral to Thoracic Surgery Dr. Dorris Fetch I have ordered PFT's  You will get a call to get this scheduled.  Follow up Televisit with me after PFT's have been done to review the results. If PFT's indicate you are not a surgical candidate, we will refer to Radiation oncology I have ordered a CT Head with and without contrast for staging. You will get a call to get this scheduled.  I have ordered this at [facility].  Continue Breztri 2 puffs twice daily Rinse mouth after use Your blood pressure is a bit elevated today. Follow up with your PCP.  Please work on quitting smoking.  You can receive free nicotine replacement therapy (patches, gum, or mints) by calling 1-800-QUIT NOW. Please call so we can get you on the path to becoming a non-smoker. I know it is hard, but you can do this!  Hypnosis for smoking cessation  [employer] [phone]  Acupuncture for smoking cessation  [employer] [phone]

## 2023-08-12 ENCOUNTER — Other Ambulatory Visit: Payer: Self-pay

## 2023-08-12 ENCOUNTER — Encounter (HOSPITAL_BASED_OUTPATIENT_CLINIC_OR_DEPARTMENT_OTHER): Payer: Medicare Other

## 2023-08-13 NOTE — Progress Notes (Signed)
The proposed treatment discussed in conference is for discussion purpose only and is not a binding recommendation.  The patients have not been physically examined, or presented with their treatment options.  Therefore, final treatment plans cannot be decided.  

## 2023-09-01 ENCOUNTER — Telehealth: Payer: Self-pay | Admitting: *Deleted

## 2023-09-01 ENCOUNTER — Other Ambulatory Visit: Payer: Self-pay | Admitting: Thoracic Surgery (Cardiothoracic Vascular Surgery)

## 2023-09-01 ENCOUNTER — Encounter: Payer: Self-pay | Admitting: Thoracic Surgery (Cardiothoracic Vascular Surgery)

## 2023-09-01 ENCOUNTER — Institutional Professional Consult (permissible substitution) (INDEPENDENT_AMBULATORY_CARE_PROVIDER_SITE_OTHER): Payer: Medicare Other | Admitting: Thoracic Surgery (Cardiothoracic Vascular Surgery)

## 2023-09-01 VITALS — BP 144/83 | HR 82 | Resp 18 | Ht 69.0 in | Wt 160.0 lb

## 2023-09-01 DIAGNOSIS — C3490 Malignant neoplasm of unspecified part of unspecified bronchus or lung: Secondary | ICD-10-CM | POA: Diagnosis not present

## 2023-09-01 DIAGNOSIS — R911 Solitary pulmonary nodule: Secondary | ICD-10-CM | POA: Diagnosis not present

## 2023-09-01 NOTE — Progress Notes (Signed)
PCP is Alvina Filbert, MD Referring Provider is Bevelyn Ngo, NP  Chief Complaint  Patient presents with   Lung Lesion    Review lung work up    HPI: [name] is sent for consultation regarding newly diagnosed adenocarcinoma of the left upper lobe.  Peter Campbell is a 66 year old man with a history of heavy tobacco abuse, COPD, remote ethanol abuse, CAD, collagen vascular disease, hypertension, essential tremor, chronic back pain, sciatica, peripheral neuropathy, arthritis, and anxiety.  Recently found to have a 1.4 cm spiculated mass in the left upper lobe on a low-dose CT for lung cancer screening.  PET/CT showed the mass was hypermetabolic with an SUV of 9.01.  There was some unusual activity in the neck not consistent with adenopathy.  There was no hilar or mediastinal adenopathy.  No evidence of distant metastases.  He underwent robotic bronchoscopy by Dr. Delton Coombes which revealed adenocarcinoma.  He has a history of heavy smoking.  Currently smoking about a pack a day.  Previously was 2 packs/day for over 50 years.  Also smokes marijuana.  Has significant back and hip pain for which he is on both long-acting oxycodone and as needed Percocet.  Physical activity is limited by pain but does get short of breath with walking.  Also will wake up from sleep short of breath about once a week.  Denies chest pain, pressure, tightness.  He has had a loss of appetite and decreased energy.  He has lost about 21 pounds over the past 6 months and 10 pounds over the past 3 months.  He was a Technical sales engineer.  Currently disabled.  Zubrod Score: At the time of surgery this patient's most appropriate activity status/level should be described as: []     0    Normal activity, no symptoms []     1    Restricted in physical strenuous activity but ambulatory, able to do out light work [x]     2    Ambulatory and capable of self care, unable to do work activities, up and about >50 % of waking hours                               []     3    Only limited self care, in bed greater than 50% of waking hours []     4    Completely disabled, no self care, confined to bed or chair []     5    Moribund  Past Medical History:  Diagnosis Date   Anxiety    Arthritis    Cataract    Chronic back pain    Collagen vascular disease (HCC)    Colonic mass    History, s/p removal; per patient.   Constipation    COPD (chronic obstructive pulmonary disease) (HCC)    Coronary atherosclerosis of native coronary artery    Dense coronary atherosclerosis by chest CT November 2014    Dyspnea    when walking distance   Essential hypertension    Essential tremor    both hands R>L, "with pain sometimes all over."- per patient.   History of chicken pox    History of German measles    Insomnia    Neuropathy    Pneumonia    2015 ish   Sciatica     Past Surgical History:  Procedure Laterality Date   BRONCHIAL BIOPSY  08/03/2023   Procedure: BRONCHIAL BIOPSIES;  Surgeon: Levy Pupa  S, MD;  Location: [facility] ENDOSCOPY;  Service: Pulmonary;;   BRONCHIAL BRUSHINGS  08/03/2023   Procedure: BRONCHIAL BRUSHINGS;  Surgeon: Leslye Peer, MD;  Location: [facility] ENDOSCOPY;  Service: Pulmonary;;   BRONCHIAL NEEDLE ASPIRATION BIOPSY  08/03/2023   Procedure: BRONCHIAL NEEDLE ASPIRATION BIOPSIES;  Surgeon: Leslye Peer, MD;  Location: [facility] ENDOSCOPY;  Service: Pulmonary;;   CIRCUMCISION     COLON SURGERY  '80's   for mass removal, colonostomy   COLONOSCOPY WITH PROPOFOL N/A 05/03/2017   Two 3-5 mm polyps in rectum and descending colon s/p removal, pancolonic diverticulosis, internal hemorrhoids. Benign. Surveillance due in 5 years.    COLONOSCOPY WITH PROPOFOL N/A 06/24/2022   Procedure: COLONOSCOPY WITH PROPOFOL;  Surgeon: Corbin Ade, MD;  Location: [facility];  Service: Endoscopy;  Laterality: N/A;  2:00 PM, pt knows to arrive at 11:30   colonostomy reversal  80's   FIDUCIAL MARKER PLACEMENT  08/03/2023   Procedure: FIDUCIAL MARKER PLACEMENT;   Surgeon: Leslye Peer, MD;  Location: [facility] ENDOSCOPY;  Service: Pulmonary;;   LUMBAR FUSION  11/27/2019   Lumber discectomy     Middle finger Right    fracture   NASAL SEPTUM SURGERY     POLYPECTOMY  05/03/2017   Procedure: POLYPECTOMY;  Surgeon: Corbin Ade, MD;  Location: [facility];  Service: Endoscopy;;  descending colon and rectal   POLYPECTOMY  06/24/2022   Procedure: POLYPECTOMY;  Surgeon: Corbin Ade, MD;  Location: [facility];  Service: Endoscopy;;   SHOULDER ACROMIOPLASTY Right 06/12/2014   Procedure: RIGHT SHOULDER ACROMIOPLASTY;  Surgeon: Darreld Mclean, MD;  Location: [facility] OR[facility];  Service: Orthopedics;  Laterality: Right;   SHOULDER OPEN ROTATOR CUFF REPAIR Right 06/12/2014   Procedure: OPEN REPAIR ROTATOR CUFF RIGHT ;  Surgeon: Darreld Mclean, MD;  Location: [facility] OR[facility];  Service: Orthopedics;  Laterality: Right;   TONSILLECTOMY     TYMPANOSTOMY TUBE PLACEMENT      Family History  Problem Relation Age of Onset   COPD Father    Cancer Father    Cancer Mother    Heart attack Brother    Epilepsy Sister    Hypertension Sister    Colon cancer Neg Hx     Social History Social History   Tobacco Use   Smoking status: Every Day    Average packs/day: 2.3 packs/day for 53.0 years (119.3 ttl pk-yrs)    Types: Cigarettes    Start date: 08/28/1969   Smokeless tobacco: Never  Vaping Use   Vaping status: Never Used  Substance Use Topics   Alcohol use: No    Alcohol/week: 0.0 standard drinks of alcohol   Drug use: Yes    Frequency: 7.0 times per week    Types: Marijuana, Other-see comments    Comment: 08/02/2023    Current Outpatient Medications  Medication Sig Dispense Refill   albuterol (VENTOLIN HFA) 108 (90 Base) MCG/ACT inhaler Inhale 2 puffs into the lungs every 4 (four) hours as needed for wheezing or shortness of breath. 54 g 3   amLODipine (NORVASC) 5 MG tablet Take 1 tablet (5 mg total) by mouth daily. 90 tablet 2   Budeson-Glycopyrrol-Formoterol (BREZTRI  AEROSPHERE) 160-9-4.8 MCG/ACT AERO Take 2 puffs first thing in am and then another 2 puffs about 12 hours later. 32.7 g 3   cholecalciferol (VITAMIN D3) 25 MCG (1000 UNIT) tablet Take 5,000 Units by mouth daily.     GNP MELATONIN 10 MG SUBL Take 1 tablet by mouth at bedtime  as needed.     ipratropium-albuterol (DUONEB) 0.5-2.5 (3) MG/3ML SOLN Take 3 mLs by nebulization every 6 (six) hours as needed (shortness of breath). 75 mL 2   linaclotide (LINZESS) 290 MCG CAPS capsule TAKE ONE CAPSULE BY MOUTH EVERY MORNING BEFORE BREAKFAST 30 capsule 11   metoprolol succinate (TOPROL-XL) 25 MG 24 hr tablet TAKE 2 TABLETS BY MOUTH IN THE MORNING AND 1 TABLET BY MOUTH IN THE EVENING. 270 tablet 2   naloxone (NARCAN) 4 MG/0.1ML LIQD nasal spray kit Place 1 spray into the nose as needed (as directed).     nitroGLYCERIN (NITROSTAT) 0.4 MG SL tablet Place 1 tablet (0.4 mg total) under the tongue every 5 (five) minutesas needed for chest pain. 25 tablet 3   oxyCODONE ER (XTAMPZA ER) 9 MG C12A Take 2 capsules by mouth daily.     oxyCODONE-acetaminophen (PERCOCET/ROXICET) 5-325 MG tablet Take 1 tablet by mouth every 8 (eight) hours as needed for severe pain.     temazepam (RESTORIL) 30 MG capsule Take 60 mg by mouth at bedtime.     No current facility-administered medications for this visit.    Allergies  Allergen Reactions   Fentanyl Other (See Comments)    Ineffective     Review of Systems  Constitutional:  Positive for activity change, fatigue and unexpected weight change (Lost 10 pounds in 3 months, 20 pounds in 6 months).  HENT:  Positive for dental problem (Dentures) and hearing loss.   Respiratory:  Positive for cough, shortness of breath (With exertion and at rest) and wheezing.   Cardiovascular:  Negative for chest pain.  Genitourinary:  Negative for difficulty urinating and dysuria.  Musculoskeletal:  Positive for arthralgias, back pain, gait problem, neck pain and neck stiffness.  Neurological:   Positive for numbness and headaches. Negative for seizures.       Chronic pain, peripheral neuropathy  Hematological:  Bruises/bleeds easily.  Psychiatric/Behavioral:  The patient is nervous/anxious.   All other systems reviewed and are negative.   BP (!) 144/83 (BP Location: Left Arm, Patient Position: Sitting)   Pulse 82   Resp 18   Ht 5\' 9"  (1.753 m)   Wt 160 lb (72.6 kg)   SpO2 92% Comment: RA  BMI 23.63 kg/m  Physical Exam Vitals reviewed.  HENT:     Head: Normocephalic and atraumatic.  Eyes:     General: No scleral icterus.    Extraocular Movements: Extraocular movements intact.  Cardiovascular:     Rate and Rhythm: Normal rate and regular rhythm.     Heart sounds: Normal heart sounds. No murmur heard. Pulmonary:     Effort: Pulmonary effort is normal. No respiratory distress.     Breath sounds: Wheezing (Bilaterally) present.  Musculoskeletal:     Cervical back: Neck supple.  Lymphadenopathy:     Cervical: No cervical adenopathy.  Skin:    General: Skin is warm and dry.  Neurological:     General: No focal deficit present.     Mental Status: He is oriented to person, place, and time.     Cranial Nerves: No cranial nerve deficit.     Gait: Gait abnormal.     Comments: Tremor right greater than left     Diagnostic Tests: CT CHEST WITHOUT CONTRAST LOW-DOSE FOR LUNG CANCER SCREENING   TECHNIQUE: Multidetector CT imaging of the chest was performed following the standard protocol without IV contrast.   RADIATION DOSE REDUCTION: This exam was performed according to the departmental dose-optimization program which  includes automated exposure control, adjustment of the mA and/or kV according to patient size and/or use of iterative reconstruction technique.   COMPARISON:  Chest CTA 10/02/2021. Low-dose lung cancer screening chest CT 06/24/2021.   FINDINGS: Cardiovascular: Heart size is normal. There is no significant pericardial fluid, thickening or  pericardial calcification. There is aortic atherosclerosis, as well as atherosclerosis of the great vessels of the mediastinum and the coronary arteries, including calcified atherosclerotic plaque in the left main, left anterior descending, left circumflex and right coronary arteries.   Mediastinum/Nodes: No pathologically enlarged lymph nodes. Please note that accurate exclusion of hilar adenopathy is limited on noncontrast CT scans. Esophagus is unremarkable in appearance. No axillary lymphadenopathy. Mediastinal or hilar   Lungs/Pleura: In the posterior aspect of the left upper lobe abutting the major fissure (axial image 196) there is a macrolobulated and slightly spiculated nodule which is new compared to the prior study with a volume derived mean diameter of 17.7 mm, concerning for probable primary bronchogenic neoplasm. No acute consolidative airspace disease. No pleural effusions. Diffuse bronchial wall thickening with mild to moderate centrilobular and paraseptal emphysema.   Upper Abdomen: Unremarkable.   Musculoskeletal: There are no aggressive appearing lytic or blastic lesions noted in the visualized portions of the skeleton.   IMPRESSION: 1. New aggressive appearing left upper lobe pulmonary nodule categorized as Lung-RADS 4BS, suspicious. Additional imaging evaluation or consultation with Pulmonology or Thoracic Surgery recommended. 2. The "S" modifier above refers to potentially clinically significant non lung cancer related findings. Specifically, there is aortic atherosclerosis, in addition to left main and three-vessel coronary artery disease. Please note that although the presence of coronary artery calcium documents the presence of coronary artery disease, the severity of this disease and any potential stenosis cannot be assessed on this non-gated CT examination. Assessment for potential risk factor modification, dietary therapy or pharmacologic therapy may  be warranted, if clinically indicated. 3. Mild diffuse bronchial wall thickening with mild to moderate centrilobular and paraseptal emphysema; imaging findings suggestive of underlying COPD.   These results will be called to the ordering clinician or representative by the Radiologist Assistant, and communication documented in the PACS or [employer].   Aortic Atherosclerosis (ICD10-I70.0) and Emphysema (ICD10-J43.9).     Electronically Signed   By: Trudie Reed M.D.   On: 07/01/2023 11:54 NUCLEAR MEDICINE PET SKULL BASE TO THIGH   TECHNIQUE: 8.5 mCi F-18 FDG was injected intravenously. Full-ring PET imaging was performed from the skull base to thigh after the radiotracer. CT data was obtained and used for attenuation correction and anatomic localization.   Fasting blood glucose: 93 mg/dl   COMPARISON:  Chest CT 06/17/2023   FINDINGS: Mediastinal blood pool activity: SUV max 1.97   Liver activity: SUV max NA   NECK: Marked hypermetabolism in the roof of the mouth likely related to dental disease. There is also fairly significant hypermetabolism along the left tongue base and down into the left submandibular region. This has an elongated appearance images and is most likely activity in the stylohyoid muscle. I do not see any abnormality CT scan. I do not see any obvious abnormality on the CT scan. No enlarged or hypermetabolic neck nodes. This is most likely artifact related to dental disease and muscular activity.   Incidental CT findings: None.   CHEST: The 14 mm spiculated nodular lesion in the left upper lobe abutting the major fissure and somewhat tenting the fissure demonstrates hypermetabolism. SUV max is 9.01 and this is consistent with a  primary lung neoplasm. No enlarged or hypermetabolic mediastinal or hilar lymph nodes to suggest metastatic adenopathy.   No other pulmonary lesions are identified.   Incidental CT findings: Stable emphysematous  changes and pulmonary scarring. Stable age advanced aortic and coronary artery calcifications.   ABDOMEN/PELVIS: No abnormal hypermetabolic activity within the liver, pancreas, adrenal glands, or spleen. No hypermetabolic lymph nodes in the abdomen or pelvis.   Incidental CT findings: Advanced vascular disease for age. No aneurysm.   SKELETON: No findings for osseous metastatic disease.   Incidental CT findings: None.   IMPRESSION: 1. Hypermetabolic 14 mm spiculated nodular lesion in the left upper lobe consistent with a primary lung neoplasm. 2. No findings for metastatic adenopathy or metastatic disease involving the abdomen/pelvis or bony structures. 3. Hypermetabolism in the neck as detailed above is likely due to dental disease and muscular activity. 4. Age advanced vascular disease.   Aortic Atherosclerosis (ICD10-I70.0) and Emphysema (ICD10-J43.9).     Electronically Signed   By: Rudie Meyer M.D.   On: 07/26/2023 11:14 I personally reviewed the CT and PET/CT images.  There is a 1.5 cm spiculated nodule in the lingular segment left upper lobe adjacent to the fissure.  No mediastinal or hilar adenopathy.  Emphysema, coronary calcification and aortic atherosclerosis.  6-minute walk test 980 feet with no desaturation  Impression: Peter Campbell is a 67 year old man with a history of heavy tobacco abuse, COPD, remote ethanol abuse, CAD, collagen vascular disease, hypertension, essential tremor, chronic back pain, sciatica, peripheral neuropathy, arthritis, anxiety, and newly diagnosed adenocarcinoma of the left upper lobe.  Adenocarcinoma left upper lobe-clinical stage IA (T1, N0) versus IB (t2a, N0) due to retraction of the visceral pleura in the fissure.  Scheduled to have an CT of the brain due to his relatively new onset headaches on 09/07/2023 to complete his metastatic workup.  He does have resectable disease.  Likely could be managed with a lingular segmentectomy.   However, I do have some significant concerns as to his ability to tolerate an operation.  He did a little better than I expected with a 6-minute walk test, so it is not out of the question to proceed with resection but I did inform him of the alternative of stereotactic radiation should he want to pursue that.  He will think over that option.  6-minute walk test is borderline, but not prohibitive.  He has not had pulmonary function testing.  We absolutely need that before we can make any final decision.    He has extensive coronary calcification on CT.  Not having any chest pain but activity is fairly limited.  He has an appointment with Dr. Diona Browner in about 2 weeks.  Will ask him for cardiology clearance.  We discussed the proposed operative procedure, which would be a robotic assisted left upper lobe lingular segmentectomy.  I informed him of the general nature of the procedure including the need for general anesthesia, the incisions to be used, the use of surgical robot, the use of drains to postoperatively, the expected hospital stay, and the overall recovery.  I informed him of the indications, risks, benefits, and alternatives.  He understands the risks include, but not limited to death, MI, DVT, PE, bleeding, possible need for transfusion, infection, prolonged air leak, cardiac arrhythmias, chronic pain issues, as well as possibility of other procedural complications.  He needs to quit smoking prior to surgery.  That includes both tobacco and other substances.  Chronic pain-uses both long-acting oxycodone as well as  Percocet as needed.  Typically uses Percocet about twice a day.  Max 4 times a day.  Plan: Pulmonary function testing Follow-up with Dr. Diona Browner as scheduled for cardiology clearance Return in 4 weeks to discuss surgery If he would like to talk to radiation oncology will be happy to make that referral.   Loreli Slot, MD [facility] [phone]

## 2023-09-01 NOTE — Telephone Encounter (Signed)
6 Minute Walk Test:  Patient ambulated 980 feet (10 laps) without any c/o of SOB. SpO2 remained above 94% during walk.  Post vitals: 131/84 HR 89 SpO2 94%

## 2023-09-02 NOTE — Progress Notes (Signed)
Location of tumor and Histology per Pathology Report:  Left upper lobe  Biopsy: 08/03/23    Past/Anticipated interventions by surgeon, if any:     Past/Anticipated interventions by medical oncology, if any:  None at this time.     Pain issues, if any:  Yes, he report sharp pain 8/10 all over his body.  SAFETY ISSUES: Prior radiation? No Pacemaker/ICD? No Is the patient on methotrexate? No       BP 129/76   Pulse 81   Temp 97.7 F (36.5 C)   Resp 18   Wt 162 lb 9.6 oz (73.8 kg)   SpO2 98%   BMI 24.01 kg/m

## 2023-09-02 NOTE — Progress Notes (Signed)
Radiation Oncology         [phone] ________________________________  Initial Outpatient Consultation  Name: Peter Campbell MRN: [id number]  Date: 09/03/2023  DOB: 1956/01/06  YN:[id number], Angelique Blonder, MD  Loreli Slot, *   REFERRING PHYSICIAN: Loreli Slot, *  DIAGNOSIS: The primary encounter diagnosis was Malignant neoplasm of upper lobe bronchus, left (HCC). A diagnosis of Pulmonary nodule was also pertinent to this visit.  Adenocarcinoma of the left upper lung, Clinical Stage IA2 (T1b, N0, M0)  pending Brain Ct  HISTORY OF PRESENT ILLNESS::Peter Campbell is a 67 y.o. male who is seen as a courtesy of Dr. Dorris Fetch for an opinion concerning radiation therapy as part of management for his recently diagnosed left lung cancer. The patient is a current smoker and has an extensive 120 pack year smoking history. He subsequently participates in the lung cancer screening program and is followed by Dr. Sherene Sires at [facility] in this setting and for COPD.   He presented for a lung cancer screening CT on 06/17/23 which demonstrated a new aggressive appearing LUL pulmonary nodule measuring 17.7 mm in mean diameter, categorized as Lung-RADS 4BS, suspicious.  To further evaluate this finding, a PET scan was performed on 07/15/23 which demonstrated hypermetabolism associated with the LUL lesion, measuring 14 mm, concerning for primary lung malignancy. PET otherwise showed no evidence of metastatic adenopathy, or metastatic disease involving the abdomen/pelvis or bony structures. Other notable findings included an area of hypermetabolism in the neck which likely correlates with dental disease and muscular activity.   He was subsequently referred to Dr. Delton Coombes and underwent a bronchoscopy on 08/03/23. FNA and brushings of the LUL collected at that time showed findings consistent with adenocarcinoma.   In most recent history, he was seen in consultation by Dr. Dorris Fetch, cardiothoracic  surgery, on 09/01/23 to discuss treatment options. In terms or surgery, Dr. Dorris Fetch states that his disease is amendable to resection. He still however needs PFT's and cardiology clearance (based on extensive coronary calcifications noted on his CT scan) prior to being seriously considered for surgery. He will also need to quit smoking if he wants to be considered for surgery. If he decides to not pursue surgery (or if he is not eligible or surgery based on his PFTs and cardio evaluation), Dr. Dorris Fetch has offered him the alternative of stereotactic radiation which we will discuss in detail today.   Of note: The patient also recently began having some headaches and is scheduled for a CT scan of the head on 09/07/2023 to complete his metastatic workup.    PREVIOUS RADIATION THERAPY: No  PAST MEDICAL HISTORY:  Past Medical History:  Diagnosis Date   Anxiety    Arthritis    Cataract    Chronic back pain    Collagen vascular disease (HCC)    Colonic mass    History, s/p removal; per patient.   Constipation    COPD (chronic obstructive pulmonary disease) (HCC)    Coronary atherosclerosis of native coronary artery    Dense coronary atherosclerosis by chest CT November 2014    Dyspnea    when walking distance   Essential hypertension    Essential tremor    both hands R>L, "with pain sometimes all over."- per patient.   History of chicken pox    History of German measles    Insomnia    Neuropathy    Pneumonia    2015 ish   Sciatica     PAST SURGICAL HISTORY: Past  Surgical History:  Procedure Laterality Date   BRONCHIAL BIOPSY  08/03/2023   Procedure: BRONCHIAL BIOPSIES;  Surgeon: Leslye Peer, MD;  Location: [facility] ENDOSCOPY;  Service: Pulmonary;;   BRONCHIAL BRUSHINGS  08/03/2023   Procedure: BRONCHIAL BRUSHINGS;  Surgeon: Leslye Peer, MD;  Location: [facility] ENDOSCOPY;  Service: Pulmonary;;   BRONCHIAL NEEDLE ASPIRATION BIOPSY  08/03/2023   Procedure: BRONCHIAL NEEDLE ASPIRATION  BIOPSIES;  Surgeon: Leslye Peer, MD;  Location: [facility] ENDOSCOPY;  Service: Pulmonary;;   CIRCUMCISION     COLON SURGERY  '80's   for mass removal, colonostomy   COLONOSCOPY WITH PROPOFOL N/A 05/03/2017   Two 3-5 mm polyps in rectum and descending colon s/p removal, pancolonic diverticulosis, internal hemorrhoids. Benign. Surveillance due in 5 years.    COLONOSCOPY WITH PROPOFOL N/A 06/24/2022   Procedure: COLONOSCOPY WITH PROPOFOL;  Surgeon: Corbin Ade, MD;  Location: [facility] ENDO SUITE;  Service: Endoscopy;  Laterality: N/A;  2:00 PM, pt knows to arrive at 11:30   colonostomy reversal  80's   FIDUCIAL MARKER PLACEMENT  08/03/2023   Procedure: FIDUCIAL MARKER PLACEMENT;  Surgeon: Leslye Peer, MD;  Location: [facility] ENDOSCOPY;  Service: Pulmonary;;   LUMBAR FUSION  11/27/2019   Lumber discectomy     Middle finger Right    fracture   NASAL SEPTUM SURGERY     POLYPECTOMY  05/03/2017   Procedure: POLYPECTOMY;  Surgeon: Corbin Ade, MD;  Location: [facility] ENDO SUITE;  Service: Endoscopy;;  descending colon and rectal   POLYPECTOMY  06/24/2022   Procedure: POLYPECTOMY;  Surgeon: Corbin Ade, MD;  Location: [facility] ENDO SUITE;  Service: Endoscopy;;   SHOULDER ACROMIOPLASTY Right 06/12/2014   Procedure: RIGHT SHOULDER ACROMIOPLASTY;  Surgeon: Darreld Mclean, MD;  Location: [facility] ORS;  Service: Orthopedics;  Laterality: Right;   SHOULDER OPEN ROTATOR CUFF REPAIR Right 06/12/2014   Procedure: OPEN REPAIR ROTATOR CUFF RIGHT ;  Surgeon: Darreld Mclean, MD;  Location: [facility] ORS;  Service: Orthopedics;  Laterality: Right;   TONSILLECTOMY     TYMPANOSTOMY TUBE PLACEMENT      FAMILY HISTORY:  Family History  Problem Relation Age of Onset   COPD Father    Cancer Father    Cancer Mother    Heart attack Brother    Epilepsy Sister    Hypertension Sister    Colon cancer Neg Hx     SOCIAL HISTORY:  Social History   Tobacco Use   Smoking status: Every Day    Average packs/day: 2.3 packs/day for 53.0 years (119.3  ttl pk-yrs)    Types: Cigarettes    Start date: 08/28/1969   Smokeless tobacco: Never  Vaping Use   Vaping status: Never Used  Substance Use Topics   Alcohol use: No    Alcohol/week: 0.0 standard drinks of alcohol   Drug use: Yes    Frequency: 7.0 times per week    Types: Marijuana, Other-see comments    Comment: 08/02/2023    ALLERGIES:  Allergies  Allergen Reactions   Fentanyl Other (See Comments)    Ineffective     MEDICATIONS:  Current Outpatient Medications  Medication Sig Dispense Refill   albuterol (VENTOLIN HFA) 108 (90 Base) MCG/ACT inhaler Inhale 2 puffs into the lungs every 4 (four) hours as needed for wheezing or shortness of breath. 54 g 3   amLODipine (NORVASC) 5 MG tablet Take 1 tablet (5 mg total) by mouth daily. 90 tablet 2   Budeson-Glycopyrrol-Formoterol (BREZTRI AEROSPHERE) 160-9-4.8 MCG/ACT AERO Take 2 puffs first  thing in am and then another 2 puffs about 12 hours later. 32.7 g 3   cholecalciferol (VITAMIN D3) 25 MCG (1000 UNIT) tablet Take 5,000 Units by mouth daily.     GNP MELATONIN 10 MG SUBL Take 1 tablet by mouth at bedtime as needed.     ipratropium-albuterol (DUONEB) 0.5-2.5 (3) MG/3ML SOLN Take 3 mLs by nebulization every 6 (six) hours as needed (shortness of breath). 75 mL 2   linaclotide (LINZESS) 290 MCG CAPS capsule TAKE ONE CAPSULE BY MOUTH EVERY MORNING BEFORE BREAKFAST 30 capsule 11   metoprolol succinate (TOPROL-XL) 25 MG 24 hr tablet TAKE 2 TABLETS BY MOUTH IN THE MORNING AND 1 TABLET BY MOUTH IN THE EVENING. 270 tablet 2   naloxone (NARCAN) 4 MG/0.1ML LIQD nasal spray kit Place 1 spray into the nose as needed (as directed).     nitroGLYCERIN (NITROSTAT) 0.4 MG SL tablet Place 1 tablet (0.4 mg total) under the tongue every 5 (five) minutesas needed for chest pain. 25 tablet 3   oxyCODONE ER (XTAMPZA ER) 9 MG C12A Take 2 capsules by mouth daily.     oxyCODONE-acetaminophen (PERCOCET/ROXICET) 5-325 MG tablet Take 1 tablet by mouth every 8  (eight) hours as needed for severe pain.     temazepam (RESTORIL) 30 MG capsule Take 60 mg by mouth at bedtime.     No current facility-administered medications for this encounter.    REVIEW OF SYSTEMS:  A 10+ POINT REVIEW OF SYSTEMS WAS OBTAINED including neurology, dermatology, psychiatry, cardiac, respiratory, lymph, extremities, GI, GU, musculoskeletal, constitutional, reproductive, HEENT.  Reports some neck/occipital headaches.  He denies any nausea or dizziness.  Denies any significant cough,  hemoptysis or pain within the chest area.  Denies use of supplemental oxygen.   PHYSICAL EXAM:  weight is 162 lb 9.6 oz (73.8 kg). His temperature is 97.7 F (36.5 C). His blood pressure is 129/76 and his pulse is 81. His respiration is 18 and oxygen saturation is 98%.   General: Alert and oriented, in no acute distress HEENT: Head is normocephalic. Extraocular movements are intact.  Neck: Neck is supple, no palpable cervical or supraclavicular lymphadenopathy. Heart: Regular in rate and rhythm with no murmurs, rubs, or gallops. Chest: Clear to auscultation bilaterally, with no rhonchi, wheezes, or rales. Abdomen: Soft, nontender, nondistended, with no rigidity or guarding. Extremities: No cyanosis or edema. Lymphatics: see Neck Exam Skin: No concerning lesions. Musculoskeletal: symmetric strength and muscle tone throughout. Neurologic: Cranial nerves II through XII are grossly intact. No obvious focalities. Speech is fluent. Coordination is intact. Psychiatric: Judgment and insight are intact. Affect is appropriate.   ECOG = 1  0 - Asymptomatic (Fully active, able to carry on all predisease activities without restriction)  1 - Symptomatic but completely ambulatory (Restricted in physically strenuous activity but ambulatory and able to carry out work of a light or sedentary nature. For example, light housework, office work)  2 - Symptomatic, <50% in bed during the day (Ambulatory and capable  of all self care but unable to carry out any work activities. Up and about more than 50% of waking hours)  3 - Symptomatic, >50% in bed, but not bedbound (Capable of only limited self-care, confined to bed or chair 50% or more of waking hours)  4 - Bedbound (Completely disabled. Cannot carry on any self-care. Totally confined to bed or chair)  5 - Death   Santiago Glad MM, Creech RH, Tormey DC, et al. [id number]). "Toxicity and response criteria of the [place]  Cooperative Oncology Group". Am. Evlyn Clines. Oncol. 5 (6): 649-55  LABORATORY DATA:  Lab Results  Component Value Date   WBC 10.9 (H) 08/03/2023   HGB 15.3 08/03/2023   HCT 45.0 08/03/2023   MCV 93.1 08/03/2023   PLT 226 08/03/2023   NEUTROABS 5.0 09/14/2022   Lab Results  Component Value Date   NA 139 08/03/2023   K 3.8 08/03/2023   CL 105 08/03/2023   CO2 28 01/31/2022   GLUCOSE 104 (H) 08/03/2023   BUN 10 08/03/2023   CREATININE 0.70 08/03/2023   CALCIUM 9.1 01/31/2022      RADIOGRAPHY: No results found.    IMPRESSION: Adenocarcinoma of the left upper lung - Clinical Stage IA2  Today we reviewed the patient's chest CT scan and PET scan images documenting the solitary left upper lung nodule which is biopsy-proven adenocarcinoma of the lung.  I discussed with the patient that he would be excellent candidate for SBRT based on the location and size of the lesion.  Anticipate that this treatment would extend over 3 sessions which would give him the best chance for cure and is the most ablative dose of radiation therapy.  We discussed the general course of SBRT,  anticipated side effects and potential long-term toxicities.  He appears to understand this type of treatment.  I also discussed with the patient that his best chances for cure would be surgery but that radiation therapy would be a reasonable alternative if he is not a candidate for surgery based on his medical situation or if he decides not to pursue surgery.   PLAN: He will  complete his staging workup later this month with a CT scan of the head.  He in addition will meet with his cardiologist to see if he would be a candidate for surgery.  He will also proceed with pulmonary function testing which would be essential in determining whether the patient would be a candidate for surgery.  He is scheduled to meet with Dr. Dorris Fetch in January and I have asked the patient to call me if he decides to pursue SBRT after meeting with him.   60 minutes of total time was spent for this patient encounter, including preparation, face-to-face counseling with the patient and coordination of care, physical exam, and documentation of the encounter.   ------------------------------------------------  Billie Lade, PhD, MD  This document serves as a record of services personally performed by Antony Blackbird, MD. It was created on his behalf by Neena Rhymes, a trained medical scribe. The creation of this record is based on the scribe's personal observations and the provider's statements to them. This document has been checked and approved by the attending provider.

## 2023-09-03 ENCOUNTER — Ambulatory Visit
Admission: RE | Admit: 2023-09-03 | Discharge: 2023-09-03 | Disposition: A | Discharge status: Home / Self Care | Payer: Medicare Other | Source: Ambulatory Visit | Attending: Radiation Oncology | Admitting: Radiation Oncology

## 2023-09-03 ENCOUNTER — Encounter: Payer: Self-pay | Admitting: Radiation Oncology

## 2023-09-03 VITALS — BP 129/76 | HR 81 | Temp 97.7°F | Resp 18 | Wt 162.6 lb

## 2023-09-03 DIAGNOSIS — G629 Polyneuropathy, unspecified: Secondary | ICD-10-CM | POA: Diagnosis not present

## 2023-09-03 DIAGNOSIS — C3412 Malignant neoplasm of upper lobe, left bronchus or lung: Secondary | ICD-10-CM | POA: Insufficient documentation

## 2023-09-03 DIAGNOSIS — I10 Essential (primary) hypertension: Secondary | ICD-10-CM | POA: Diagnosis not present

## 2023-09-03 DIAGNOSIS — F17210 Nicotine dependence, cigarettes, uncomplicated: Secondary | ICD-10-CM | POA: Insufficient documentation

## 2023-09-03 DIAGNOSIS — Z923 Personal history of irradiation: Secondary | ICD-10-CM | POA: Insufficient documentation

## 2023-09-03 DIAGNOSIS — Z79899 Other long term (current) drug therapy: Secondary | ICD-10-CM | POA: Insufficient documentation

## 2023-09-03 DIAGNOSIS — R911 Solitary pulmonary nodule: Secondary | ICD-10-CM

## 2023-09-03 DIAGNOSIS — J449 Chronic obstructive pulmonary disease, unspecified: Secondary | ICD-10-CM | POA: Insufficient documentation

## 2023-09-03 DIAGNOSIS — I2510 Atherosclerotic heart disease of native coronary artery without angina pectoris: Secondary | ICD-10-CM | POA: Diagnosis not present

## 2023-09-07 ENCOUNTER — Ambulatory Visit (HOSPITAL_COMMUNITY)
Admission: RE | Admit: 2023-09-07 | Discharge: 2023-09-07 | Disposition: A | Discharge status: Home / Self Care | Payer: Medicare Other | Source: Ambulatory Visit | Attending: Acute Care | Admitting: Acute Care

## 2023-09-07 DIAGNOSIS — C3411 Malignant neoplasm of upper lobe, right bronchus or lung: Secondary | ICD-10-CM

## 2023-09-07 DIAGNOSIS — C3490 Malignant neoplasm of unspecified part of unspecified bronchus or lung: Secondary | ICD-10-CM

## 2023-09-07 MED ORDER — IOHEXOL 300 MG/ML  SOLN
75.0000 mL | Freq: Once | INTRAMUSCULAR | Status: AC | PRN
  Administered 2023-09-07: 75 mL via INTRAVENOUS

## 2023-09-09 ENCOUNTER — Ambulatory Visit (HOSPITAL_COMMUNITY)
Admission: RE | Admit: 2023-09-09 | Discharge: 2023-09-09 | Disposition: A | Discharge status: Home / Self Care | Payer: Medicare Other | Source: Ambulatory Visit | Attending: Thoracic Surgery (Cardiothoracic Vascular Surgery) | Admitting: Thoracic Surgery (Cardiothoracic Vascular Surgery)

## 2023-09-09 DIAGNOSIS — Z79899 Other long term (current) drug therapy: Secondary | ICD-10-CM | POA: Insufficient documentation

## 2023-09-09 DIAGNOSIS — R911 Solitary pulmonary nodule: Secondary | ICD-10-CM | POA: Insufficient documentation

## 2023-09-09 DIAGNOSIS — F17210 Nicotine dependence, cigarettes, uncomplicated: Secondary | ICD-10-CM | POA: Insufficient documentation

## 2023-09-09 DIAGNOSIS — C3490 Malignant neoplasm of unspecified part of unspecified bronchus or lung: Secondary | ICD-10-CM | POA: Diagnosis present

## 2023-09-09 DIAGNOSIS — J439 Emphysema, unspecified: Secondary | ICD-10-CM | POA: Insufficient documentation

## 2023-09-09 LAB — PULMONARY FUNCTION TEST
DL/VA % pred: 79 %
DL/VA: 3.24 ml/min/mmHg/L
DLCO unc % pred: 76 %
DLCO unc: 20.23 ml/min/mmHg
FEF 25-75 Post: 1.38 L/s
FEF 25-75 Pre: 1.04 L/s
FEF2575-%Change-Post: 32 %
FEF2575-%Pred-Post: 51 %
FEF2575-%Pred-Pre: 38 %
FEV1-%Change-Post: 13 %
FEV1-%Pred-Post: 79 %
FEV1-%Pred-Pre: 70 %
FEV1-Post: 2.7 L
FEV1-Pre: 2.39 L
FEV1FVC-%Change-Post: 10 %
FEV1FVC-%Pred-Pre: 70 %
FEV6-%Change-Post: 6 %
FEV6-%Pred-Post: 105 %
FEV6-%Pred-Pre: 99 %
FEV6-Post: 4.58 L
FEV6-Pre: 4.31 L
FEV6FVC-%Change-Post: 5 %
FEV6FVC-%Pred-Post: 105 %
FEV6FVC-%Pred-Pre: 99 %
FVC-%Change-Post: 2 %
FVC-%Pred-Post: 102 %
FVC-%Pred-Pre: 99 %
FVC-Post: 4.68 L
FVC-Pre: 4.55 L
Post FEV1/FVC ratio: 58 %
Post FEV6/FVC ratio: 100 %
Pre FEV1/FVC ratio: 52 %
Pre FEV6/FVC Ratio: 95 %
RV % pred: 173 %
RV: 4.11 L
TLC % pred: 126 %
TLC: 8.86 L

## 2023-09-09 MED ORDER — ALBUTEROL SULFATE (2.5 MG/3ML) 0.083% IN NEBU
2.5000 mg | INHALATION_SOLUTION | Freq: Once | RESPIRATORY_TRACT | Status: AC
  Administered 2023-09-09: 2.5 mg via RESPIRATORY_TRACT

## 2023-09-16 ENCOUNTER — Encounter: Payer: Self-pay | Admitting: *Deleted

## 2023-09-16 ENCOUNTER — Ambulatory Visit: Payer: Medicare Other | Attending: Cardiology | Admitting: Cardiology

## 2023-09-16 ENCOUNTER — Encounter: Payer: Self-pay | Admitting: Cardiology

## 2023-09-16 VITALS — BP 118/54 | HR 78 | Ht 69.0 in | Wt 161.0 lb

## 2023-09-16 DIAGNOSIS — R0602 Shortness of breath: Secondary | ICD-10-CM | POA: Insufficient documentation

## 2023-09-16 DIAGNOSIS — I2510 Atherosclerotic heart disease of native coronary artery without angina pectoris: Secondary | ICD-10-CM | POA: Insufficient documentation

## 2023-09-16 DIAGNOSIS — R079 Chest pain, unspecified: Secondary | ICD-10-CM | POA: Insufficient documentation

## 2023-09-16 DIAGNOSIS — Z720 Tobacco use: Secondary | ICD-10-CM | POA: Diagnosis present

## 2023-09-16 DIAGNOSIS — I10 Essential (primary) hypertension: Secondary | ICD-10-CM | POA: Insufficient documentation

## 2023-09-16 DIAGNOSIS — R002 Palpitations: Secondary | ICD-10-CM | POA: Insufficient documentation

## 2023-09-16 DIAGNOSIS — C3412 Malignant neoplasm of upper lobe, left bronchus or lung: Secondary | ICD-10-CM | POA: Insufficient documentation

## 2023-09-16 NOTE — Patient Instructions (Signed)
Medication Instructions:  Your physician recommends that you continue on your current medications as directed. Please refer to the Current Medication list given to you today.  *If you need a refill on your cardiac medications before your next appointment, please call your pharmacy*   Lab Work: Your physician recommends that you return for lab work in: Today  If you have labs (blood work) drawn today and your tests are completely normal, you will receive your results only by: MyChart Message (if you have MyChart) OR A paper copy in the mail If you have any lab test that is abnormal or we need to change your treatment, we will call you to review the results.   Testing/Procedures: Your physician has requested that you have a lexiscan myoview. For further information please visit [email or web address] Please follow instruction sheet, as given.  Your physician has requested that you have an echocardiogram. Echocardiography is a painless test that uses sound waves to create images of your heart. It provides your doctor with information about the size and shape of your heart and how well your heart's chambers and valves are working. This procedure takes approximately one hour. There are no restrictions for this procedure. Please do NOT wear cologne, perfume, aftershave, or lotions (deodorant is allowed). Please arrive 15 minutes prior to your appointment time.  Please note: We ask at that you not bring children with you during ultrasound (echo/ vascular) testing. Due to room size and safety concerns, children are not allowed in the ultrasound rooms during exams. Our front office staff cannot provide observation of children in our lobby area while testing is being conducted. An adult accompanying a patient to their appointment will only be allowed in the ultrasound room at the discretion of the ultrasound technician under special circumstances. We apologize for any inconvenience.    Follow-Up: At  [facility], you and your health needs are our priority.  As part of our continuing mission to provide you with exceptional heart care, we have created designated Provider Care Teams.  These Care Teams include your primary Cardiologist (physician) and Advanced Practice Providers (APPs -  Physician Assistants and Nurse Practitioners) who all work together to provide you with the care you need, when you need it.  We recommend signing up for the patient portal called "MyChart".  Sign up information is provided on this After Visit Summary.  MyChart is used to connect with patients for Virtual Visits (Telemedicine).  Patients are able to view lab/test results, encounter notes, upcoming appointments, etc.  Non-urgent messages can be sent to your provider as well.   To learn more about what you can do with MyChart, go to [email or web address].    Your next appointment:    3-4 Months   Provider:   You may see Nona Dell, MD or one of the following Advanced Practice Providers on your designated Care Team:   Randall An, PA-C  Jacolyn Reedy, PA-C     Other Instructions Thank you for choosing [facility]!

## 2023-09-16 NOTE — Progress Notes (Signed)
Cardiology Office Note:  .   Date:  09/16/2023  ID:  Peter Campbell, DOB May 27, 1956, MRN [id number] PCP: Peter Filbert, MD   HeartCare Providers Cardiologist:  Peter Dell, MD    History of Present Illness: .   Peter Campbell is a 67 y.o. male with a past medical history of coronary calcifications on CT (low-risk NST in 2018), hypertension, COPD, lung nodules, who presents today for follow-up.   Previously he was followed by Dr. Diona Campbell in 08/2020 and denied any recent anginal symptoms at that time. He was continued on his current medical therapy including ASA 81 mg daily, Amlodipine 5 mg daily, Toprol-XL 50 mg in AM/25 mg in PM and PRN SL NTG.    He was admitted to [facility] in 01/2022 for evaluation of worsening back pain. Reported having a pinched nerve in his back and was treated with pain medication with outpatient PT follow-up recommended. He did report episodes of chest pain and ruled out for ACS. Echocardiogram was performed and showed a preserved EF of 55 to 60% with no regional wall motion abnormalities. By review of Care Everywhere, he was admitted to [facility] from 5/11  - 02/17/2022 for back pain and MRI showed small disc herniation at L5/S1 causing nerve root impingement. He underwent microdiscectomy on 02/11/2022. It appears Cardiology was consulted during admission for preoperative cardiac clearance and was found to have a calcium score greater than 400. He therefore underwent a repeat Lexiscan Myoview which showed no evidence of ischemia.   He was last seen in clinic 05/04/2023 by Dr. Diona Campbell.  Overall he was stable from the cardiac perspective.  He had stopped low-dose aspirin therapy due to frequent bruising.  The plan was to initiate low-dose statin therapy and if tolerated recheck fasting lipid panel and LFTs in 3 months.  He returns to clinic today stating that he has been doing fairly okay.  He was recently diagnosed with lung cancer and states he has followed up with  Dr. Dorris Campbell cardio thoracic surgeon discussed VATS but was advised that his heart may not be strong at the end of the procedure.  He also followed up with radiology oncologist and talked about radiation therapy.  He stated he recently had PFTs completed and was advised that he needed to keep his appointment today for surgical clearance and because they were concerned that his heart may not withstand the surgery.  He states that he does have worsening fatigue, worsening shortness of breath, and occasional exertional chest discomfort that he states is a quick grabbing type sensation that is short-lived that is worsens mostly with exertion sometimes happens with rest ,but not to the point that he is required any use of his Nitrostat.  He had previously stopped taking his aspirin therapy due to increased bruising and was no longer able to tolerate cholesterol medication so he had stopped both of those.  States that his medications that he is on he has been compliant with.  Denies any recent hospitalizations or visits to the emergency department.  ROS: 10 point review of system has been reviewed and considered negative with exception was been listed in the HPI  Studies Reviewed: .       2D echo 01/31/2022 1. Left ventricular ejection fraction, by estimation, is 55 to 60%. The  left ventricle has normal function. The left ventricle has no regional  wall motion abnormalities. Left ventricular diastolic parameters are  indeterminate.   2. Right ventricular systolic function is normal.  The right ventricular  size is normal. Tricuspid regurgitation signal is inadequate for assessing  PA pressure.   3. Left atrial size was mild to moderately dilated.   4. The mitral valve is grossly normal. Trivial mitral valve  regurgitation.   5. The aortic valve is tricuspid. Aortic valve regurgitation is not  visualized.   6. The inferior vena cava is normal in size with greater than 50%  respiratory variability,  suggesting right atrial pressure of 3 mmHg.   Lexiscan Myoview 07/08/2017 The study is normal. This is a low risk study. The left ventricular ejection fraction is normal (55-65%). There was no ST segment deviation noted during stress.   Diaphragmatic attenuation no ischemia. SDS 1. Normal wall motion EF 60%   Myoview Stress 10/03/2013 IMPRESSION:  1. Normal Lexiscan Cardiolite stress test.   2.  No evidence of ischemia or scar.   3.  Soft tissue attenuation artifact noted.   4.  Normal left ventricular systolic function, calculated LVEF 63%.   Risk Assessment/Calculations:             Physical Exam:   VS:  BP (!) 118/54   Pulse 78   Ht 5\' 9"  (1.753 m)   Wt 161 lb (73 kg)   SpO2 96%   BMI 23.78 kg/m    Wt Readings from Last 3 Encounters:  09/16/23 161 lb (73 kg)  09/03/23 162 lb 9.6 oz (73.8 kg)  09/01/23 160 lb (72.6 kg)    GEN: Well nourished, well developed in no acute distress NECK: No JVD; No carotid bruits CARDIAC: RRR, no murmurs, rubs, gallops RESPIRATORY:  Clear to auscultation without rales, wheezing or rhonchi  ABDOMEN: Soft, non-tender, non-distended EXTREMITIES:  No edema; No deformity   ASSESSMENT AND PLAN: .   Preoperative cardiovascular examination for potential procedure after recent diagnosis of lung cancer.  Patient has been scheduled for [facility] and an echocardiogram prior to cardiac clearance being offered due to his current symptoms.    [name] perioperative risk of a major cardiac event is 0.9% according to the Revised Cardiac Risk Index (RCRI).  Therefore, he is at high risk for perioperative complications.   His functional capacity is poor at 4.64 METs according to the [place] Activity Status Index (DASI). Recommendations: The patient requires an echocardiogram before a disposition can be made regarding surgical risk.                 Coronary artery calcifications by CT imaging the coronary calcium score greater than 4 with Lexiscan  Myoview showing no evidence of ischemia May 2023 at [facility].  He stopped his aspirin due to frequent bruising and tried to initiate low-dose statin therapy but was unable to tolerate it and stopped that as well.  He does have occasional exertional chest discomfort that he has not required the use of any Nitrostat.  With upcoming surgery being discussed he has been scheduled for [facility].  Hypertension with blood pressure today of 118/54.  He is continued on amlodipine 5 mg daily and metoprolol succinate 50 mg in the a.m. and 25 mg in the p.m.  He is also been encouraged to continue to monitor his blood pressures 1 to 2 hours postmedication administration at home.  Palpitations which have resolved since being on Toprol-XL.  He is continued on his current therapy.  Adenocarcinoma of the left upper lung clinical stage at a 2.  He is followed up with cardiothoracic surgery and radiation oncology.  He is  preparing for possible surgery.  Tobacco abuse where total cessation is recommended.    Informed Consent   Shared Decision Making/Informed Consent The risks [chest pain, shortness of breath, cardiac arrhythmias, dizziness, blood pressure fluctuations, myocardial infarction, stroke/transient ischemic attack, nausea, vomiting, allergic reaction, radiation exposure, metallic taste sensation and life-threatening complications (estimated to be 1 in 10,000)], benefits (risk stratification, diagnosing coronary artery disease, treatment guidance) and alternatives of a nuclear stress test were discussed in detail with Mr. Wendler and he agrees to proceed.     Dispo: Patient return to clinic to see MD/APP in 3 months or sooner if needed for reevaluation of symptoms.  If testing returns with adverse results that his follow-up appointment will need to be moved to determine next steps in treatment or further testing that would might be needed.  Signed, Roxanna Mcever, NP

## 2023-09-27 ENCOUNTER — Other Ambulatory Visit (HOSPITAL_COMMUNITY)
Admission: RE | Admit: 2023-09-27 | Discharge: 2023-09-27 | Disposition: A | Discharge status: Home / Self Care | Payer: Medicare Other | Source: Ambulatory Visit | Attending: Cardiology | Admitting: Cardiology

## 2023-09-27 ENCOUNTER — Ambulatory Visit (HOSPITAL_BASED_OUTPATIENT_CLINIC_OR_DEPARTMENT_OTHER)
Admission: RE | Admit: 2023-09-27 | Discharge: 2023-09-27 | Disposition: A | Discharge status: Home / Self Care | Payer: Medicare Other | Source: Ambulatory Visit | Attending: Cardiology | Admitting: Cardiology

## 2023-09-27 ENCOUNTER — Ambulatory Visit (HOSPITAL_COMMUNITY)
Admission: RE | Admit: 2023-09-27 | Discharge: 2023-09-27 | Disposition: A | Discharge status: Home / Self Care | Payer: Medicare Other | Source: Ambulatory Visit | Attending: Cardiology | Admitting: Cardiology

## 2023-09-27 DIAGNOSIS — R079 Chest pain, unspecified: Secondary | ICD-10-CM | POA: Insufficient documentation

## 2023-09-27 LAB — NM MYOCAR MULTI W/SPECT W/WALL MOTION / EF
LV dias vol: 127 mL (ref 62–150)
LV sys vol: 52 mL
Nuc Stress EF: 60 %
Peak HR: 97 {beats}/min
RATE: 0.4
Rest HR: 69 {beats}/min
Rest Nuclear Isotope Dose: 10.7 mCi
SDS: 1
SRS: 1
SSS: 2
ST Depression (mm): 0 mm
Stress Nuclear Isotope Dose: 31.2 mCi
TID: 1.1

## 2023-09-27 LAB — HEPATIC FUNCTION PANEL
ALT: 14 U/L (ref 0–44)
AST: 13 U/L — ABNORMAL LOW (ref 15–41)
Albumin: 3.7 g/dL (ref 3.5–5.0)
Alkaline Phosphatase: 71 U/L (ref 38–126)
Bilirubin, Direct: 0.1 mg/dL (ref 0.0–0.2)
Indirect Bilirubin: 0.6 mg/dL (ref 0.3–0.9)
Total Bilirubin: 0.7 mg/dL (ref <1.2)
Total Protein: 6.6 g/dL (ref 6.5–8.1)

## 2023-09-27 LAB — LIPID PANEL
Cholesterol: 124 mg/dL (ref 0–200)
HDL: 44 mg/dL (ref >40)
LDL Cholesterol: 68 mg/dL (ref 0–99)
Total CHOL/HDL Ratio: 2.8 {ratio}
Triglycerides: 58 mg/dL (ref <150)
VLDL: 12 mg/dL (ref 0–40)

## 2023-09-27 MED ORDER — REGADENOSON 0.4 MG/5ML IV SOLN
INTRAVENOUS | Status: AC
  Administered 2023-09-27: 0.4 mg via INTRAVENOUS
  Filled 2023-09-27: qty 5

## 2023-09-27 MED ORDER — TECHNETIUM TC 99M TETROFOSMIN IV KIT
10.7000 | PACK | Freq: Once | INTRAVENOUS | Status: AC | PRN
Start: 2023-09-27 — End: 2023-09-27
  Administered 2023-09-27: 10.7 via INTRAVENOUS

## 2023-09-27 MED ORDER — SODIUM CHLORIDE FLUSH 0.9 % IV SOLN
INTRAVENOUS | Status: AC
  Administered 2023-09-27: 10 mL via INTRAVENOUS
  Filled 2023-09-27: qty 10

## 2023-09-27 MED ORDER — TECHNETIUM TC 99M TETROFOSMIN IV KIT
31.2000 | PACK | Freq: Once | INTRAVENOUS | Status: AC | PRN
  Administered 2023-09-27: 31.2 via INTRAVENOUS

## 2023-09-27 NOTE — Progress Notes (Signed)
Stress testing was considered normal and the study is low risk.  Normal EF with no RWMA 60%.  Patient can proceed with surgery as planned.

## 2023-10-01 ENCOUNTER — Telehealth: Payer: Self-pay

## 2023-10-01 ENCOUNTER — Ambulatory Visit (HOSPITAL_COMMUNITY)
Admission: RE | Admit: 2023-10-01 | Discharge: 2023-10-01 | Disposition: A | Discharge status: Home / Self Care | Payer: Medicare Other | Source: Ambulatory Visit | Attending: Cardiology | Admitting: Cardiology

## 2023-10-01 DIAGNOSIS — R0602 Shortness of breath: Secondary | ICD-10-CM | POA: Diagnosis present

## 2023-10-01 LAB — ECHOCARDIOGRAM COMPLETE
Area-P 1/2: 2.76 cm2
S' Lateral: 3.5 cm

## 2023-10-01 MED ORDER — NEXLIZET 180-10 MG PO TABS: 1.0000 | ORAL_TABLET | Freq: Every day | ORAL | 6 refills | Status: DC

## 2023-10-01 MED ORDER — NEXLIZET 180-10 MG PO TABS: 1.0000 | ORAL_TABLET | Freq: Every day | ORAL | 0 refills | Status: DC

## 2023-10-01 NOTE — Telephone Encounter (Signed)
-----   Message from Jayson Sierras sent at 09/27/2023  5:26 PM EST ----- Results reviewed.  Labs ordered after initiating trial of statin therapy after our last visit in August.  Per chart review, reportedly did not tolerate therapy.  His present lipids show LDL 68, HDL 44.  I saw he had a recent APP visit as well.  Could consider Nexlizet  daily as a non-statin alternative given his coronary artery calcification.

## 2023-10-01 NOTE — Progress Notes (Signed)
*  PRELIMINARY RESULTS* Echocardiogram 2D Echocardiogram has been performed.  Stacey Drain 10/01/2023, 12:41 PM

## 2023-10-01 NOTE — Progress Notes (Signed)
 Heart squeeze estimated 60-65%, there are no wall motion abnormalities, muscle stiffness noted likely from age and hypertension, no evidence of valvular abnormalities.  No findings suggestive of symptoms.

## 2023-10-01 NOTE — Telephone Encounter (Signed)
 Lab results discussed with patient. He is willing to try Nexlizet 180/10 mg daily.  Samples given, rx e-scribed to [facility]

## 2023-10-05 ENCOUNTER — Other Ambulatory Visit: Payer: Self-pay | Admitting: Thoracic Surgery (Cardiothoracic Vascular Surgery)

## 2023-10-05 ENCOUNTER — Ambulatory Visit (INDEPENDENT_AMBULATORY_CARE_PROVIDER_SITE_OTHER): Payer: Medicare Other | Admitting: Thoracic Surgery (Cardiothoracic Vascular Surgery)

## 2023-10-05 ENCOUNTER — Encounter: Payer: Self-pay | Admitting: Thoracic Surgery (Cardiothoracic Vascular Surgery)

## 2023-10-05 VITALS — BP 150/83 | HR 84 | Resp 20 | Ht 69.0 in | Wt 160.0 lb

## 2023-10-05 DIAGNOSIS — R911 Solitary pulmonary nodule: Secondary | ICD-10-CM | POA: Diagnosis not present

## 2023-10-05 NOTE — Progress Notes (Signed)
 [address]       [place] [address]             [phone]     HPI: Mr. Pichardo returns to discuss management of his lung cancer.  Alan Drummer is a 68 year old man with a history of heavy tobacco abuse, COPD, remote ethanol abuse, CAD, collagen vascular disease, hypertension, essential tremor, chronic back pain, sciatica, peripheral neuropathy, arthritis, and anxiety.  He was found to have a 1.4 cm spiculated mass in the left upper lobe on a low-dose CT for lung cancer screening.  On PET/CT it was hypermetabolic with an SUV of 9.  There also was an area of unusual activity in the left neck not consistent with adenopathy.  There was no hilar or mediastinal adenopathy.  He underwent robotic bronchoscopy by Dr. Shelah.  Biopsy showed adenocarcinoma.  He was referred for consideration for surgery.  I saw him about a month ago and discussed potential surgical resection.  He needed cardiology clearance.  He wanted to talk to radiation oncology about the possibility of stereotactic radiation.  He saw Dr. Shannon.  He had a stress test which showed no ischemia.  Echocardiogram showed an ejection fraction of 65% with no valvular pathology.  Continues to smoke.  Currently about a pack a day down from 2 packs a day.  Continues to have shortness of breath with exertion.  Positive coughing and wheezing.  He did 980 feet without any desaturations on a 6-minute walk test in the office previously.  Primary complaint currently is pain in his left neck, jaw and ear.  Sometimes aggravated by chewing and swallowing.  Also aggravated by lying on that side at night.  Has not been evaluated.  Zubrod Score: At the time of surgery this patient's most appropriate activity status/level should be described as: []     0    Normal activity, no symptoms []     1    Restricted in physical strenuous activity but ambulatory, able to do out light work [x]     2    Ambulatory and capable of self care, unable  to do work activities, up and about >50 % of waking hours                              []     3    Only limited self care, in bed greater than 50% of waking hours []     4    Completely disabled, no self care, confined to bed or chair []     5    Moribund  Past Medical History:  Diagnosis Date   Anxiety    Arthritis    Cataract    Chronic back pain    Collagen vascular disease (HCC)    Colonic mass    History, s/p removal; per patient.   Constipation    COPD (chronic obstructive pulmonary disease) (HCC)    Coronary atherosclerosis of native coronary artery    Dense coronary atherosclerosis by chest CT November 2014    Dyspnea    when walking distance   Essential hypertension    Essential tremor    both hands R>L, with pain sometimes all over.- per patient.   History of chicken pox    History of German measles    Insomnia    Neuropathy    Pneumonia    2015 ish   Sciatica  Past Surgical History:  Procedure Laterality Date   BRONCHIAL BIOPSY  08/03/2023   Procedure: BRONCHIAL BIOPSIES;  Surgeon: Shelah Lamar RAMAN, MD;  Location: [facility] ENDOSCOPY;  Service: Pulmonary;;   BRONCHIAL BRUSHINGS  08/03/2023   Procedure: BRONCHIAL BRUSHINGS;  Surgeon: Shelah Lamar RAMAN, MD;  Location: [facility] ENDOSCOPY;  Service: Pulmonary;;   BRONCHIAL NEEDLE ASPIRATION BIOPSY  08/03/2023   Procedure: BRONCHIAL NEEDLE ASPIRATION BIOPSIES;  Surgeon: Shelah Lamar RAMAN, MD;  Location: [facility] ENDOSCOPY;  Service: Pulmonary;;   CIRCUMCISION     COLON SURGERY  '80's   for mass removal, colonostomy   COLONOSCOPY WITH PROPOFOL  N/A 05/03/2017   Two 3-5 mm polyps in rectum and descending colon s/p removal, pancolonic diverticulosis, internal hemorrhoids. Benign. Surveillance due in 5 years.    COLONOSCOPY WITH PROPOFOL  N/A 06/24/2022   Procedure: COLONOSCOPY WITH PROPOFOL ;  Surgeon: Shaaron Lamar HERO, MD;  Location: [facility] ENDO SUITE;  Service: Endoscopy;  Laterality: N/A;  2:00 PM, pt knows to arrive at 11:30   colonostomy reversal   80's   FIDUCIAL MARKER PLACEMENT  08/03/2023   Procedure: FIDUCIAL MARKER PLACEMENT;  Surgeon: Shelah Lamar RAMAN, MD;  Location: [facility] ENDOSCOPY;  Service: Pulmonary;;   LUMBAR FUSION  11/27/2019   Lumber discectomy     Middle finger Right    fracture   NASAL SEPTUM SURGERY     POLYPECTOMY  05/03/2017   Procedure: POLYPECTOMY;  Surgeon: Shaaron Lamar HERO, MD;  Location: [facility] ENDO SUITE;  Service: Endoscopy;;  descending colon and rectal   POLYPECTOMY  06/24/2022   Procedure: POLYPECTOMY;  Surgeon: Shaaron Lamar HERO, MD;  Location: [facility] ENDO SUITE;  Service: Endoscopy;;   SHOULDER ACROMIOPLASTY Right 06/12/2014   Procedure: RIGHT SHOULDER ACROMIOPLASTY;  Surgeon: Lemond Stable, MD;  Location: [facility] ORS;  Service: Orthopedics;  Laterality: Right;   SHOULDER OPEN ROTATOR CUFF REPAIR Right 06/12/2014   Procedure: OPEN REPAIR ROTATOR CUFF RIGHT ;  Surgeon: Lemond Stable, MD;  Location: [facility] ORS;  Service: Orthopedics;  Laterality: Right;   TONSILLECTOMY     TYMPANOSTOMY TUBE PLACEMENT      Current Outpatient Medications  Medication Sig Dispense Refill   albuterol  (VENTOLIN  HFA) 108 (90 Base) MCG/ACT inhaler Inhale 2 puffs into the lungs every 4 (four) hours as needed for wheezing or shortness of breath. 54 g 3   amLODipine  (NORVASC ) 5 MG tablet Take 1 tablet (5 mg total) by mouth daily. 90 tablet 2   Bempedoic Acid-Ezetimibe (NEXLIZET ) 180-10 MG TABS Take 1 tablet by mouth daily. 30 tablet 6   Bempedoic Acid-Ezetimibe (NEXLIZET ) 180-10 MG TABS Take 1 tablet by mouth daily. 14 tablet 0   Budeson-Glycopyrrol-Formoterol  (BREZTRI  AEROSPHERE) 160-9-4.8 MCG/ACT AERO Take 2 puffs first thing in am and then another 2 puffs about 12 hours later. 32.7 g 3   cholecalciferol (VITAMIN D3) 25 MCG (1000 UNIT) tablet Take 5,000 Units by mouth daily.     GNP MELATONIN 10 MG SUBL Take 1 tablet by mouth at bedtime as needed.     ipratropium-albuterol  (DUONEB) 0.5-2.5 (3) MG/3ML SOLN Take 3 mLs by nebulization every 6 (six) hours as  needed (shortness of breath). 75 mL 2   linaclotide  (LINZESS ) 290 MCG CAPS capsule TAKE ONE CAPSULE BY MOUTH EVERY MORNING BEFORE BREAKFAST 30 capsule 11   metoprolol  succinate (TOPROL -XL) 25 MG 24 hr tablet TAKE 2 TABLETS BY MOUTH IN THE MORNING AND 1 TABLET BY MOUTH IN THE EVENING. 270 tablet 2   naloxone  (NARCAN ) 4 MG/0.1ML LIQD nasal spray kit Place 1 spray  into the nose as needed (as directed).     nitroGLYCERIN  (NITROSTAT ) 0.4 MG SL tablet Place 1 tablet (0.4 mg total) under the tongue every 5 (five) minutesas needed for chest pain. 25 tablet 3   oxyCODONE  ER (XTAMPZA  ER) 9 MG C12A Take 2 capsules by mouth daily.     oxyCODONE -acetaminophen  (PERCOCET/ROXICET) 5-325 MG tablet Take 1 tablet by mouth every 8 (eight) hours as needed for severe pain.     temazepam  (RESTORIL ) 30 MG capsule Take 60 mg by mouth at bedtime.     No current facility-administered medications for this visit.    Physical Exam BP (!) 150/83   Pulse 84   Resp 20   Ht 5' 9 (1.753 m)   Wt 160 lb (72.6 kg)   SpO2 92% Comment: RA  BMI 23.55 kg/m  68 year old man in no acute distress Alert and oriented x 3 with no focal deficits No cervical or supraclavicular adenopathy Lungs wheezing bilaterally right greater than left Cardiac regular rate and rhythm, no murmur No peripheral edema or clubbing  Diagnostic Tests: I reviewed his CT and PET/CT which showed a 1.4 cm nodule in the lingular segment of the left upper lobe abutting the fissure that is markedly hypermetabolic on PET.  Pulmonary function testing 09/09/2023 FVC 4.55 (99%) FEV1 2.39 (70%) FEV1 2.70 (79%) postbronchodilator DLCO 20.23 (76%)  Impression: Donyale Berthold is a 68 year old man with a history of heavy tobacco abuse, COPD, remote ethanol abuse, CAD, collagen vascular disease, hypertension, essential tremor, chronic back pain, sciatica, peripheral neuropathy, arthritis, anxiety, and recently diagnosed adenocarcinoma of the lung.  Adenocarcinoma  left upper lobe-1.4 cm nodule in the lingular segment.  Clinical stage Ia (T1, N0), versus possible 1B if there is visceral pleural involvement (T2a, N0).  I had a long discussion with Mr. Scheier regarding our options for treatment of his lung cancer.  We discussed surgery and stereotactic radiation.  He was under the impression that he needed both.  I informed him that these are an either/ or option.  He understands surgery is the gold standard as it provides the best chance of a cure.  Also understands that there are risks associated with surgery as well as significant impact on his respiratory function.  He has adequate pulmonary reserve to tolerate a resection, but is not happy with his respiratory status currently and surgery is likely to make that worse.  I again discussed the potential operation.  The plan would be to do a robotic assisted left upper lobe lingular segmentectomy versus lobectomy depending on intraoperative findings.  He understands the need for general anesthesia, the incisions to be used, the use of the surgical robot, the use of a drainage tube postoperatively, the expected hospital stay, and the overall recovery.  I informed him of the indications, risks, benefits, and alternatives.  He understands the risks include, but not limited to death, MI, DVT, PE, bleeding, possible need for transfusion, infection, prolonged air leak, cardiac arrhythmias, as well as possibility of other unforeseeable complications.  He understands the degree of pain is unpredictable and that some people have chronic pain issues.  He understands that his risk for respiratory complications including pneumonia is higher because of his ongoing smoking.  He is uncertain as to how he would like to proceed.  Currently he is totally focused on his left neck pain and wants to have that evaluated before making a decision.  He will follow-up with Dr. Katrinka for referral to someone to evaluate the  neck pain.  I emphasized  the importance of getting the lung cancer dealt with urgently as he did not seem to have a good comprehension of that.  I asked him to call us  back and let us  know whether he wants to do surgery or radiation so that we can plan accordingly.  It would have been 3 months since his most recent scan before surgery so he would need another CT should he choose that option.  Plan: He will follow-up with his primary care regarding his neck/jaw/ear pain. He will call and let us  know whether he wants to do surgery or radiation. If he decides to do surgery he needs a repeat CT as it has been about 3 months since his most recent scan.  Elspeth JAYSON Millers, MD [facility] [phone]

## 2023-11-29 ENCOUNTER — Telehealth: Payer: Self-pay | Admitting: Internal Medicine

## 2023-11-29 ENCOUNTER — Other Ambulatory Visit: Payer: Self-pay

## 2023-11-29 NOTE — Telephone Encounter (Signed)
 This is a Cancer PT wanting to ask Dr. Sherene Sires about Peter Campbell. He states it is the only thing that works for him and the alternative medications do not work. Can Dr. Sherene Sires start an appeal to the [employer] so he can continue on the [place]. Also, can we get some samples for him . He is completely out. His # [phone]

## 2023-11-29 NOTE — Telephone Encounter (Signed)
 Per pharmacy Markus Daft is not on Medicare Part D formulary. They will cover Trelegy.   Unsure if we can do appeal since it is not even on formulary.  Routing to pharmacy team to see if they know the answer to this.

## 2023-11-30 ENCOUNTER — Other Ambulatory Visit (HOSPITAL_COMMUNITY): Payer: Self-pay

## 2023-11-30 ENCOUNTER — Other Ambulatory Visit (HOSPITAL_COMMUNITY): Payer: Self-pay | Admitting: Internal Medicine

## 2023-11-30 DIAGNOSIS — Z136 Encounter for screening for cardiovascular disorders: Secondary | ICD-10-CM

## 2023-11-30 DIAGNOSIS — Z87891 Personal history of nicotine dependence: Secondary | ICD-10-CM

## 2023-11-30 NOTE — Telephone Encounter (Signed)
 Patient started here in 2023 and was on Breztri already. We have no documentation to support trial/fail previous inhalers. Patient requesting to stay on Breztri but is not on his plan formulary, they will cover Trelegy.  Please advise, thank you!

## 2023-12-01 NOTE — Telephone Encounter (Signed)
 Called and lvm for patient to call us back regarding medication change

## 2023-12-01 NOTE — Telephone Encounter (Signed)
 PT ret our call. States he will try Trelegy. He'll take anything because he can hardly breath.  [employer] in [place] is [employer]

## 2023-12-02 ENCOUNTER — Ambulatory Visit (HOSPITAL_COMMUNITY)

## 2023-12-02 ENCOUNTER — Other Ambulatory Visit: Payer: Self-pay

## 2023-12-02 MED ORDER — TRELEGY ELLIPTA 100-62.5-25 MCG/ACT IN AEPB
1.0000 | INHALATION_SPRAY | Freq: Every morning | RESPIRATORY_TRACT | 2 refills | Status: DC
Start: 2023-12-02 — End: 2023-12-29

## 2023-12-02 NOTE — Telephone Encounter (Signed)
 Left detailed vm for patient advising pt that this was sent to pharmacy for him that he can go to the pharmacy to pick this up. If patient had any concerns he could call us back, make has been sent to [facility] per Patient request.

## 2023-12-07 ENCOUNTER — Ambulatory Visit (HOSPITAL_COMMUNITY)
Admission: RE | Admit: 2023-12-07 | Discharge: 2023-12-07 | Disposition: A | Discharge status: Home / Self Care | Source: Ambulatory Visit | Attending: Internal Medicine | Admitting: Internal Medicine

## 2023-12-07 DIAGNOSIS — Z87891 Personal history of nicotine dependence: Secondary | ICD-10-CM | POA: Insufficient documentation

## 2023-12-07 DIAGNOSIS — Z136 Encounter for screening for cardiovascular disorders: Secondary | ICD-10-CM | POA: Diagnosis present

## 2023-12-22 ENCOUNTER — Encounter: Payer: Self-pay | Admitting: Cardiology

## 2023-12-22 ENCOUNTER — Ambulatory Visit: Payer: Medicare Other | Attending: Cardiology | Admitting: Cardiology

## 2023-12-22 VITALS — BP 134/76 | HR 78 | Ht 70.0 in | Wt 164.9 lb

## 2023-12-22 DIAGNOSIS — I10 Essential (primary) hypertension: Secondary | ICD-10-CM | POA: Diagnosis present

## 2023-12-22 DIAGNOSIS — R931 Abnormal findings on diagnostic imaging of heart and coronary circulation: Secondary | ICD-10-CM | POA: Diagnosis not present

## 2023-12-22 DIAGNOSIS — C3412 Malignant neoplasm of upper lobe, left bronchus or lung: Secondary | ICD-10-CM | POA: Insufficient documentation

## 2023-12-22 MED ORDER — NEXLIZET 180-10 MG PO TABS: 1.0000 | ORAL_TABLET | Freq: Every day | ORAL | 6 refills | Status: DC

## 2023-12-22 MED ORDER — NITROGLYCERIN 0.4 MG SL SUBL: SUBLINGUAL_TABLET | SUBLINGUAL | 3 refills | Status: DC

## 2023-12-22 NOTE — Telephone Encounter (Signed)
 NFN

## 2023-12-22 NOTE — Progress Notes (Signed)
 Cardiology Office Note  Date: 12/22/2023   ID: Peter Campbell, DOB 12/29/1955, MRN [id number]  History of Present Illness: Peter Campbell is a 68 y.o. male last seen in December 2024 by Ms. Hammock NP, I reviewed the note.  He is here for a routine visit.  Reports no angina or interval nitroglycerin use.  He did undergo follow-up cardiac structural and ischemic testing in December 2024 and January of this year as noted below, overall reassuring.  He has adenocarcinoma of the left upper lobe, 1.4 cm nodule within the lingular segment.  He saw Dr. Dorris Fetch regarding possible surgical management in January.  He did not make a decision about surgery versus stereotactic radiation.  His main concern is that he watched his ex-wife go through treatment for cancer and did poorly, it sounds like she was on chemotherapy among other treatments.  From what I can see in the chart, it looks like surgical management could potentially be curative without other treatment modalities.  I have encouraged him to reach back out to Dr. Dorris Fetch.  We went over his medications today.  He reports compliance, needs a refill for next visit.  Physical Exam: VS:  BP 132/80   Pulse 78   Ht 5\' 10"  (1.778 m)   Wt 164 lb 14.4 oz (74.8 kg)   SpO2 98%   BMI 23.66 kg/m , BMI Body mass index is 23.66 kg/m.  Wt Readings from Last 3 Encounters:  12/22/23 164 lb 14.4 oz (74.8 kg)  10/05/23 160 lb (72.6 kg)  09/16/23 161 lb (73 kg)    General: Patient appears comfortable at rest. HEENT: Conjunctiva and lids normal. Neck: Supple, no elevated JVP or carotid bruits. Lungs: Decreased breath sounds without wheezing. Cardiac: Regular rate and rhythm, no S3 or significant systolic murmur, no pericardial rub. Extremities: No pitting edema.  ECG:  An ECG dated 05/04/2023 was personally reviewed today and demonstrated:  Sinus rhythm.  Labwork: 08/03/2023: BUN 10; Creatinine, Ser 0.70; Hemoglobin 15.3; Platelets 226; Potassium  3.8; Sodium 139 09/27/2023: ALT 14; AST 13     Component Value Date/Time   CHOL 124 09/27/2023 0929   TRIG 58 09/27/2023 0929   HDL 44 09/27/2023 0929   CHOLHDL 2.8 09/27/2023 0929   VLDL 12 09/27/2023 0929   LDLCALC 68 09/27/2023 0929   Other Studies Reviewed Today:  Lexiscan Myoview 09/27/2023:   The study is normal. The study is low risk.   No ST deviation was noted.   LV perfusion is normal. There is no evidence of ischemia. There is no evidence of infarction.   Left ventricular function is normal. End diastolic cavity size is normal. End systolic cavity size is normal.   Significant diaphragmatic attenuation. No ischemia. Normal EF with no RWMA;s 60%  Echocardiogram 10/01/2023:  1. Left ventricular ejection fraction, by estimation, is 60 to 65%. The  left ventricle has normal function. The left ventricle has no regional  wall motion abnormalities. There is mild left ventricular hypertrophy.  Left ventricular diastolic parameters  are consistent with Grade I diastolic dysfunction (impaired relaxation).   2. Right ventricular systolic function is normal. The right ventricular  size is normal.   3. The mitral valve is normal in structure. No evidence of mitral valve  regurgitation. No evidence of mitral stenosis.   4. The aortic valve is tricuspid. Aortic valve regurgitation is not  visualized. No aortic stenosis is present.   5. The inferior vena cava is normal in size with  greater than 50%  respiratory variability, suggesting right atrial pressure of 3 mmHg.   Assessment and Plan:  1.  Coronary artery calcification by CT imaging.  Coronary calcium score greater than 400.  Lexiscan Myoview in December 2024 showed no evidence of ischemia and echocardiogram in January of this year revealed LVEF 60 to 65%.  Clinically stable at this point.  He is not on aspirin due to prior frequent bruising.  Continue Nexlizet 180/10 mg once daily, he has as needed nitroglycerin available.   2.   Primary hypertension.  Continue Norvasc 5 mg daily and Toprol-XL 25 mg twice daily.  3.  Adenocarcinoma of the lung.  He has follow-up pending with Dr. Sherene Sires.  I encouraged him to reach back out to Dr. Dorris Fetch as well for further discussion about potential surgical management.  He would be overall intermediate risk for surgery, RCRI perioperative risk index approximately 10% chance of major adverse cardiac event.  Should be able to proceed given reassuring cardiac testing within the last 6 months.  Disposition:  Follow up  6 months.  Signed, Jonelle Sidle, M.D., F.A.C.C. [facility]

## 2023-12-22 NOTE — Patient Instructions (Signed)
 Medication Instructions:  Your physician recommends that you continue on your current medications as directed. Please refer to the Current Medication list given to you today.   Labwork: None today  Testing/Procedures: None today  Follow-Up: 6 months  Any Other Special Instructions Will Be Listed Below (If Applicable).  If you need a refill on your cardiac medications before your next appointment, please call your pharmacy.

## 2023-12-26 NOTE — Progress Notes (Unsigned)
 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     History of Present Illness  09/14/2022  Pulmonary/ 1st office eval/ Peter Campbell / [facility]  Chief Complaint  Patient presents with   Consult    SOB/ COPD   Dyspnea:  onset x 2018 gradually worse since then to point at ov better since rx bevespi limited back and breathing [employer] half the store lower pace = MMRC3 = can't walk 100 yards even at a slow pace at a flat grade s stopping due to sob   Cough: rattling cough in am / Sleep: flat bed / one pillow at least once a week wakes up and needs albuterol  SABA use: much less on bevespi 02: none  Lung cancer screen: referred at [facility] 09/14/2022  Rec Plan A = Automatic = Always=    Breztri or Bevesp Take 2 puffs first thing in am and then another 2 puffs about 12 hours later.   Work on inhaler technique:  Plan B = Backup (to supplement plan A, not to replace it) Only use your albuterol inhaler as a rescue medication Plan C = Crisis (instead of Plan B but only if Plan B stops working) - only use your albuterol nebulizer if you first try Plan B and it fails to help > ok to use the nebulizer up to every 4 hours but if start needing it regularly call for immediate appointment The key is to stop smoking completely before smoking completely stops you! Please schedule a follow up visit in 3 months but call sooner if needed with PFTs on return  allergy screen Eos 0.2  alpha one AT phenotype  MM      07/12/2023  f/u ov/[place] office/Peter Campbell re: COPD/AB  maint on Breztri  / SPN w/u progress  Chief Complaint  Patient presents with   COPD  Dyspnea:  walking at stores moderate pace eg [employer] ok  Cough: better/ no heme   Sleeping: flat bed / one bid pillow under head wakens up to  3 x weeks and uses saba  SABA use: sev  times a day while on breztri / always p exerttion  02: none  Rec Plan A = Automatic = Always=    Breztri Take 2 puffs first thing in am and then another 2 puffs about 12 hours later.  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  Clinical stage Ia (T1, N0), versus possible 1B if there is visceral pleural involvement (T2a, N0).   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    Also denies any obvious fluctuation of symptoms with weather or environmental changes or other aggravating or alleviating factors except as outlined above   No unusual exposure hx or h/o childhood pna/ asthma or knowledge of premature birth.  Current Allergies, Complete Past Medical History, Past Surgical History, Family History, and Social History were reviewed in [employer] record.  ROS  The following are not active complaints unless bolded Hoarseness, sore throat, dysphagia,  dental problems, itching, sneezing,  nasal congestion or discharge of excess mucus or purulent secretions, ear ache,   fever, chills, sweats, unintended wt loss or wt gain, classically pleuritic or exertional cp,  orthopnea pnd or arm/hand swelling  or leg swelling, presyncope, palpitations, abdominal pain, anorexia, nausea, vomiting, diarrhea  or change in bowel habits or change in bladder habits, change in stools or change in urine, dysuria, hematuria,  rash, arthralgias, visual complaints, headache, numbness, weakness or ataxia or problems with walking or coordination,  change in mood or  memory.        Current Meds  Medication Sig   albuterol (VENTOLIN HFA) 108 (90 Base) MCG/ACT inhaler Inhale 2 puffs into the lungs every 4 (four) hours as needed for wheezing or shortness of breath.   amLODipine (NORVASC) 5 MG tablet Take 1 tablet (5 mg total) by mouth daily.   cholecalciferol (VITAMIN D3) 25 MCG (1000 UNIT) tablet Take 5,000 Units by mouth daily.   Fluticasone-Umeclidin-Vilant (TRELEGY ELLIPTA) 100-62.5-25 MCG/ACT AEPB Inhale 1 puff into the lungs every morning.   GNP MELATONIN 10 MG SUBL Take 1 tablet by mouth at bedtime as needed.   ipratropium-albuterol (DUONEB) 0.5-2.5 (3) MG/3ML SOLN Take 3 mLs by nebulization every 6 (six) hours as needed (shortness of breath).   linaclotide (LINZESS) 290 MCG CAPS capsule TAKE ONE CAPSULE BY MOUTH EVERY MORNING BEFORE BREAKFAST   metoprolol succinate (TOPROL-XL) 25 MG 24 hr tablet TAKE 2 TABLETS BY MOUTH IN THE MORNING AND 1 TABLET BY MOUTH IN THE EVENING.   naloxone (NARCAN) 4 MG/0.1ML LIQD nasal spray kit Place 1 spray into the nose as needed (as directed).   nitroGLYCERIN (NITROSTAT) 0.4 MG SL tablet Place 1 tablet (0.4 mg total) under the tongue every 5 (five) minutesas needed for chest pain.   oxyCODONE ER (XTAMPZA ER) 9 MG C12A Take 2 capsules by mouth daily.   oxyCODONE-acetaminophen (PERCOCET/ROXICET) 5-325 MG tablet Take 1 tablet by mouth every 8  (eight) hours as needed for severe pain.   temazepam (RESTORIL) 30 MG capsule Take 60 mg by mouth at bedtime.                  Past Medical History:  Diagnosis Date   Arthritis    Chronic back pain    Collagen vascular disease (HCC)    Colonic mass    History, s/p removal; per patient.   Constipation    COPD (chronic obstructive pulmonary disease) (HCC)    Coronary atherosclerosis of native  coronary artery    Dense coronary atherosclerosis by chest CT November 2014    Dyspnea    when walking distance   Essential hypertension    Essential tremor    History of chicken pox    History of German measles    Neuropathy    Pneumonia    2015 ish   Sciatica        Objective:    Wts  12/29/2023         167 07/12/2023     156   12/21/22 167 lb 6.4 oz (75.9 kg)  09/14/22 177 lb 6.4 oz (80.5 kg)  06/24/22 176 lb (79.8 kg)      Vital signs reviewed  12/29/2023  - Note at rest 02 sats  95% on RA   General appearance:    amb wm / slt gruff voice with smoker's rattle   HEENT : Oropharynx  clear   Nasal turbinates nl    NECK :  without  apparent JVD/ palpable Nodes/TM    LUNGS: no acc muscle use,  Mild barrel  contour chest wall with bilateral mild insp/ exp rhonchi  and  without cough on insp or exp maneuvers  and mild  Hyperresonant  to  percussion bilaterally     CV:  RRR  no s3 or murmur or increase in P2, and no edema   ABD:  soft and nontender with pos end  insp Hoover's  in the supine position.  No bruits or organomegaly appreciated   MS:  Nl gait/ ext warm without deformities Or obvious joint restrictions  calf tenderness, cyanosis or clubbing     SKIN: warm and dry without lesions    NEURO:  alert, approp, nl sensorium with  no motor or cerebellar deficits apparent.        Assessment

## 2023-12-29 ENCOUNTER — Ambulatory Visit (INDEPENDENT_AMBULATORY_CARE_PROVIDER_SITE_OTHER): Admitting: Internal Medicine

## 2023-12-29 ENCOUNTER — Encounter: Payer: Self-pay | Admitting: Internal Medicine

## 2023-12-29 VITALS — BP 135/74 | HR 71 | Ht 70.0 in | Wt 167.0 lb

## 2023-12-29 DIAGNOSIS — R911 Solitary pulmonary nodule: Secondary | ICD-10-CM

## 2023-12-29 DIAGNOSIS — F17210 Nicotine dependence, cigarettes, uncomplicated: Secondary | ICD-10-CM

## 2023-12-29 DIAGNOSIS — J449 Chronic obstructive pulmonary disease, unspecified: Secondary | ICD-10-CM

## 2023-12-29 MED ORDER — TRELEGY ELLIPTA 100-62.5-25 MCG/ACT IN AEPB: 1.0000 | INHALATION_SPRAY | Freq: Every morning | RESPIRATORY_TRACT | 11 refills | Status: AC

## 2023-12-29 MED ORDER — ALBUTEROL SULFATE HFA 108 (90 BASE) MCG/ACT IN AERS: 2.0000 | INHALATION_SPRAY | RESPIRATORY_TRACT | 3 refills | Status: DC | PRN

## 2023-12-29 NOTE — Patient Instructions (Addendum)
 Dr Roselind Messier radiation therapy  :   [phone]  The key is to stop smoking completely before smoking completely stops you!   Please schedule a follow up visit in 6  months but call sooner if needed

## 2023-12-29 NOTE — Assessment & Plan Note (Addendum)
 Counseled re importance of smoking cessation but did not meet time criteria for separate billing    F.u q 6 m, sooner prn  Each maintenance medication was reviewed in detail including emphasizing most importantly the difference between maintenance and prns and under what circumstances the prns are to be triggered using an action plan format where appropriate.  Total time for H and P, chart review, counseling, reviewing hfa  device(s) and generating customized AVS unique to this office visit / same day charting = 27 min

## 2023-12-29 NOTE — Assessment & Plan Note (Signed)
 Active smoker/MM - Labs ordered 09/14/2022  :  allergy screen Eos 0.2  alpha one AT phenotype  197 MM  - 09/14/2022   Walked on RA  x  3  lap(s) =  approx 450  ft  @ mod fast pace, stopped due to end of study with lowest 02 sats 93% mild sob last lap  - 09/14/2022  After extensive coaching inhaler device,  effectiveness =    80% hfa - PFT's  09/09/23  FEV1 2.70 (79 % ) ratio 0.58  p 13 % improvement from saba p Breztri prior to study with DLCO  20.23 (76%)   and FV curve mildly concave      Group D (now reclassified as E) in terms of symptom/risk and laba/lama/ICS  therefore appropriate rx at this point >>>  contiunue breztri and approp saba

## 2023-12-29 NOTE — Assessment & Plan Note (Signed)
 See LDSCT 06/17/23 Post LUL macrolobulated and slightly spiculated nodule which is new compared to the prior study with a volume derived mean diameter of 17.7 mm, - PET  07/15/23 >   FOB  08/03/23 Pos  Bx Pols NSCL Ca > referred to surgery but delined - 12/29/2023 referred back to RT/ [facility] as does not want surgery   Discussed in detail all the  indications, usual  risks and alternatives  relative to the benefits with patient who agrees to proceed with Rx as outlined.

## 2024-01-17 ENCOUNTER — Other Ambulatory Visit: Payer: Self-pay | Admitting: Cardiology

## 2024-04-24 ENCOUNTER — Encounter: Payer: Self-pay | Admitting: Internal Medicine

## 2024-05-23 ENCOUNTER — Ambulatory Visit: Admitting: Internal Medicine

## 2024-05-23 ENCOUNTER — Encounter: Payer: Self-pay | Admitting: Internal Medicine

## 2024-05-23 ENCOUNTER — Other Ambulatory Visit: Payer: Self-pay | Admitting: *Deleted

## 2024-05-23 VITALS — BP 136/78 | HR 80 | Temp 98.0°F | Ht 70.0 in | Wt 163.0 lb

## 2024-05-23 DIAGNOSIS — K5909 Other constipation: Secondary | ICD-10-CM | POA: Diagnosis not present

## 2024-05-23 DIAGNOSIS — Z860100 Personal history of colon polyps, unspecified: Secondary | ICD-10-CM

## 2024-05-23 DIAGNOSIS — K5900 Constipation, unspecified: Secondary | ICD-10-CM

## 2024-05-23 DIAGNOSIS — H269 Unspecified cataract: Secondary | ICD-10-CM

## 2024-05-23 NOTE — Progress Notes (Signed)
 Peter Campbell

## 2024-05-23 NOTE — Progress Notes (Signed)
 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     History of Present Illness  09/14/2022  Pulmonary/ 1st office eval/ Azelie Noguera / [place] [facility]  Chief Complaint  Patient presents with   Consult    SOB/ COPD   Dyspnea:  onset x 2018 gradually worse since then to point at ov better since rx bevespi limited back and breathing [employer] half the store lower pace = MMRC3 = can't walk 100 yards even at a slow pace at a flat grade s stopping due to sob   Cough: rattling cough in am / Sleep: flat bed / one pillow at least once a week wakes up and needs albuterol   SABA use: much less on bevespi 02: none  Lung cancer screen: referred at [facility] 09/14/2022  Rec Plan A = Automatic = Always=    Breztri  or Bevesp Take 2 puffs first thing in am and then another 2 puffs about 12 hours later.   Work on inhaler technique:  Plan B = Backup (to supplement plan A, not to replace it) Only use your albuterol  inhaler as a rescue medication Plan C = Crisis (instead of Plan B but only if Plan B stops working) - only use your albuterol  nebulizer if you first try Plan B and it fails to help > ok to use the nebulizer up to every 4 hours but if start needing it regularly call for immediate appointment The key is to stop smoking completely before smoking completely stops you! Please schedule a follow up visit in 3 months but call sooner if needed with PFTs on return  allergy screen Eos 0.2  alpha one AT phenotype  MM      07/12/2023  f/u ov/[place] office/Sante Biedermann re: COPD/AB  maint on Breztri   / SPN w/u progress  Chief Complaint  Patient presents with   COPD  Dyspnea:  walking at stores moderate pace eg [employer] ok  Cough: better/ no heme   Sleeping: flat bed / one bid pillow under head wakens up to  3 x weeks and uses saba  SABA  use: sev times a day while on breztri  / always p exerttion  02: none  Rec Plan A = Automatic = Always=    Breztri  Take 2 puffs first thing in am and then another 2 puffs about 12 hours later.  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  Clinical stage Ia (T1, N0), versus possible 1B if there is visceral pleural involvement (T2a, N0).   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    05/24/2024  f/u ov/ office/Aveena Bari re: COPD GOLD 2 / AB / NSCL lung ca  (still declining rx) maint on trelegy  active   smoker / mild flare cough / wheeze x 2 weeks   Chief Complaint  Patient presents with   COPD    F/u - Lung cancer    Dyspnea:  [employer] ok across parking lot  Cough: none now-  was yellow  Sleeping: flat bed / 2 pillows s noct    resp cc  SABA use: sev times a week, no neb  02: none     No obvious day to day or daytime variability or assoc  mucus plugs or hemoptysis or cp or chest tightness,  or  overt sinus or hb symptoms.    Also denies any obvious fluctuation of symptoms with weather or environmental changes or other aggravating or alleviating factors except as outlined above   No unusual exposure hx or h/o childhood pna/ asthma or knowledge of premature birth.  Current Allergies, Complete Past Medical History, Past Surgical History, Family History, and Social History were reviewed in [employer] record.  ROS  The following are not active complaints unless bolded Hoarseness, sore throat, dysphagia, dental problems, itching, sneezing,  nasal congestion or discharge of excess mucus or purulent secretions, ear ache,   fever, chills, sweats, unintended wt loss or wt gain, classically pleuritic or exertional cp,  orthopnea pnd or arm/hand swelling  or leg swelling, presyncope, palpitations, abdominal pain, anorexia, nausea, vomiting, diarrhea  or change in bowel habits or change in bladder habits, change in stools or change in urine, dysuria, hematuria,  rash, arthralgias, visual complaints, headache, numbness, weakness or ataxia or problems with walking or coordination,  change in mood or  memory.        Not sure of meds      Past Medical History:  Diagnosis Date   Arthritis    Chronic back pain    Collagen vascular disease (HCC)    Colonic mass    History, s/p removal; per patient.   Constipation    COPD (chronic obstructive pulmonary disease) (HCC)    Coronary atherosclerosis of native coronary artery    Dense coronary atherosclerosis by chest CT November 2014    Dyspnea    when walking distance   Essential hypertension    Essential tremor    History of chicken pox    History of German measles    Neuropathy    Pneumonia    2015 ish   Sciatica        Objective:    Wts  05/24/2024       162  12/29/2023         167 07/12/2023     156   12/21/22 167 lb 6.4 oz (75.9 kg)  09/14/22 177 lb 6.4 oz (80.5 kg)  06/24/22 176 lb (79.8 kg)     Vital  signs reviewed  05/24/2024  - Note at rest 02 sats  95% on RA   General appearance:    ambulatory elderly wm nad / mild rattle   HEENT : Oropharynx  clear       NECK :  without  apparent JVD/ palpable Nodes/TM    LUNGS: no acc muscle use,  Mild barrel  contour chest wall with bilateral minimal insp/exp rhonchi and  without cough on insp or exp maneuvers  and mild  Hyperresonant  to  percussion bilaterally     CV:  RRR  no s3 or murmur or increase in P2, and no edema   ABD:  soft and nontender with pos end  insp Hoover's  in the supine position.  No bruits or organomegaly appreciated   MS:  Nl gait/ ext warm without deformities Or obvious joint restrictions  calf tenderness, cyanosis or clubbing     SKIN: warm and dry without lesions    NEURO:  alert, approp, nl sensorium with  no motor or cerebellar deficits apparent.      CXR PA and Lateral:   05/24/2024 :    I personally reviewed images and impression is as follows:     Copd/ increased density LLL nodule, RML atx and new RLL density     Assessment   Assessment & Plan Pulmonary nodule See LDSCT 06/17/23 Post LUL macrolobulated and slightly spiculated nodule which is new compared to the prior study with a volume derived mean diameter of 17.7 mm, - PET  07/15/23 >   FOB  08/03/23 Pos  Bx Pols NSCL Ca > referred to surgery but delined - 12/29/2023 referred back to RT/ [facility] as does not want surgery > no rx as of 05/24/2024   Appears to have progressive dz/ should reconsider chemo options / advised.  He also has Dr Colton phone number if changes his mind about RT but clearly now now a surgical candidate COPD GOLD 2 Active smoker/MM - Labs ordered 09/14/2022  :  allergy screen Eos 0.2  alpha one AT phenotype  197 MM  - 09/14/2022   Walked on RA  x  3  lap(s) =  approx 450  ft  @ mod fast pace, stopped due to end of study with lowest 02 sats 93% mild sob last lap  - 09/14/2022  After extensive coaching inhaler device,   effectiveness =    80% hfa - PFT's  09/09/23  FEV1 2.70 (79 % ) ratio 0.58  p 13 % improvement from saba p Breztri  prior to study with DLCO  20.23 (76%)   and FV curve mildly concave      Group D (now reclassified as E) in terms of symptom/risk and laba/lama/ICS  therefore appropriate rx at this point >>>  trelegy plus approp saba and for mild flare last sev weeks: Prednisone  10 mg take  4 each am x 2 days,   2 each am x 2 days,  1 each am x 2 days and stop    Cigarette smoker Counseled re importance of smoking cessation but did not meet time criteria for separate billing    F/u q 3 m, sooner if needed  with all meds in hand using a trust but verify approach to confirm accurate Medication  Reconciliation The principal here is that until we are certain that the  patients are doing what we've asked, it makes no sense to ask them to do more.   Each respiratory  medication was reviewed in detail including emphasizing most importantly the difference between maintenance and prns  and under what circumstances the prns are to be triggered using an action plan format (ABC)   - see avs for instructions unique to this ov   Total time for H and P, chart review, counseling, reviewing dpi/hfa /neb  device(s) and generating customized AVS unique to this office visit / same day charting = 32 min          AVS  Patient Instructions  Plan A = Automatic = Always=    Trelegy   Plan B = Backup (to supplement plan A, not to replace it) Use your albuterol  inhaler as a rescue medication to be used if you can't catch your breath by resting or slowing your pace  or doing a relaxed purse lip breathing pattern.  - The less you use it, the better it will work when you need it. - Ok to use the inhaler up to 2 puffs  every 4 hours if you must but call for appointment if use goes up over your usual need - Don't leave home without it !!  (think of it like the spare tire or starter fluid for your car)   Plan C = Crisis  (instead of Plan B but only if Plan B stops working) - only use your albuterol  nebulizer if you first try Plan B and it fails to help > ok to use the nebulizer up to every 4 hours but if start needing it regularly call for immediate appointment   Prednisone  10 mg take  4 each am x 2 days,   2 each am x 2 days,  1 each am x 2 days and stop   Please remember to go to the  x-ray department  @  [facility] for your tests - we will call you with the results when they are available     The key is to stop smoking completely before smoking completely stops you!     Please schedule a follow up visit in 3 months but call sooner if needed    Ozell America, MD 05/28/2024

## 2024-05-23 NOTE — Patient Instructions (Addendum)
 Good to see you again!  Continue Linzess  290  (1) gelcap daily.  Dispense 90 with 3 additional refills  Follow-up colonoscopy due in 5 years  At your request, we will refer you to Dr. Donnice Dally in [place] [address] [phone].  We we will make the referral from Dr. Shaaron.  See back in 1 year and as needed

## 2024-05-23 NOTE — Progress Notes (Signed)
 Primary Care Physician:  Katrinka Aquas, MD Primary Gastroenterologist:  Dr. Shaaron  Pre-Procedure History & Physical: HPI:  Peter Campbell is a 68 y.o. male here for follow-up chronic constipation.  We started Linzess  29O  - 1 gelcap daily previously; this has been what it he say sis a miracle drug.  Facilitating 1 bowel movement daily.  He is having no rectal bleeding.   history of colonic polyps; due for surveillance colonoscopy in 5 years.  Unfortunately, patient states he was recently diagnosed with lung cancer.  He is undergoing staging at this time.  Patient would like me to refer him to an ophthalmologist.  He states his ophthalmologist in [place] retired.  He tells me he is having progression of his cataracts and needs to get something done.   Past Medical History:  Diagnosis Date   Anxiety    Arthritis    Cataract    Chronic back pain    Collagen vascular disease (HCC)    Colonic mass    History, s/p removal; per patient.   Constipation    COPD (chronic obstructive pulmonary disease) (HCC)    Coronary atherosclerosis of native coronary artery    Dense coronary atherosclerosis by chest CT November 2014    Dyspnea    when walking distance   Essential hypertension    Essential tremor    both hands R>L, with pain sometimes all over.- per patient.   History of chicken pox    History of German measles    Insomnia    Lung cancer (HCC) 07/2023   Neuropathy    Pneumonia    2015 ish   Sciatica     Past Surgical History:  Procedure Laterality Date   BRONCHIAL BIOPSY  08/03/2023   Procedure: BRONCHIAL BIOPSIES;  Surgeon: Shelah Lamar RAMAN, MD;  Location: [facility] ENDOSCOPY;  Service: Pulmonary;;   BRONCHIAL BRUSHINGS  08/03/2023   Procedure: BRONCHIAL BRUSHINGS;  Surgeon: Shelah Lamar RAMAN, MD;  Location: [facility] ENDOSCOPY;  Service: Pulmonary;;   BRONCHIAL NEEDLE ASPIRATION BIOPSY  08/03/2023   Procedure: BRONCHIAL NEEDLE ASPIRATION BIOPSIES;  Surgeon: Shelah Lamar RAMAN, MD;   Location: [facility] ENDOSCOPY;  Service: Pulmonary;;   CIRCUMCISION     COLON SURGERY  '80's   for mass removal, colonostomy   COLONOSCOPY WITH PROPOFOL  N/A 05/03/2017   Two 3-5 mm polyps in rectum and descending colon s/p removal, pancolonic diverticulosis, internal hemorrhoids. Benign. Surveillance due in 5 years.    COLONOSCOPY WITH PROPOFOL  N/A 06/24/2022   Procedure: COLONOSCOPY WITH PROPOFOL ;  Surgeon: Shaaron Lamar HERO, MD;  Location: [facility] ENDO SUITE;  Service: Endoscopy;  Laterality: N/A;  2:00 PM, pt knows to arrive at 11:30   colonostomy reversal  80's   FIDUCIAL MARKER PLACEMENT  08/03/2023   Procedure: FIDUCIAL MARKER PLACEMENT;  Surgeon: Shelah Lamar RAMAN, MD;  Location: [facility] ENDOSCOPY;  Service: Pulmonary;;   LUMBAR FUSION  11/27/2019   Lumber discectomy     Middle finger Right    fracture   NASAL SEPTUM SURGERY     POLYPECTOMY  05/03/2017   Procedure: POLYPECTOMY;  Surgeon: Shaaron Lamar HERO, MD;  Location: [facility] ENDO SUITE;  Service: Endoscopy;;  descending colon and rectal   POLYPECTOMY  06/24/2022   Procedure: POLYPECTOMY;  Surgeon: Shaaron Lamar HERO, MD;  Location: [facility] ENDO SU[facility];  Service: Endoscopy;;   SHOULDER ACROMIOPLASTY Right 06/12/2014   Procedure: RIGHT SHOULDER ACROMIOPLASTY;  Surgeon: Lemond Stable, MD;  Location: [facility] ORS;  Service: Orthopedics;  Laterality: Right;  SHOULDER OPEN ROTATOR CUFF REPAIR Right 06/12/2014   Procedure: OPEN REPAIR ROTATOR CUFF RIGHT ;  Surgeon: Lemond Stable, MD;  Location: [facility] ORS;  Service: Orthopedics;  Laterality: Right;   TONSILLECTOMY     TYMPANOSTOMY TUBE PLACEMENT      Prior to Admission medications   Medication Sig Start Date End Date Taking? Authorizing Provider  albuterol  (VENTOLIN  HFA) 108 (90 Base) MCG/ACT inhaler Inhale 2 puffs into the lungs every 4 (four) hours as needed for wheezing or shortness of breath. 12/29/23  Yes Darlean Ozell NOVAK, MD  amLODipine  (NORVASC ) 5 MG tablet Take 1 tablet (5 mg total) by mouth daily. 05/04/23  Yes Debera Jayson MATSU,  MD  cholecalciferol (VITAMIN D3) 25 MCG (1000 UNIT) tablet Take 5,000 Units by mouth daily.   Yes [provider]  GNP MELATONIN 10 MG SUBL Take 1 tablet by mouth at bedtime as needed. 04/30/23  Yes [provider]  ipratropium-albuterol  (DUONEB) 0.5-2.5 (3) MG/3ML SOLN Take 3 mLs by nebulization every 6 (six) hours as needed (shortness of breath). 01/31/22  Yes Emokpae, Courage, MD  linaclotide  (LINZESS ) 290 MCG CAPS capsule TAKE ONE CAPSULE BY MOUTH EVERY MORNING BEFORE BREAKFAST 07/21/23  Yes Shamir Sedlar, Lamar HERO, MD  metoprolol  succinate (TOPROL -XL) 25 MG 24 hr tablet TAKE TWO TABLETS BY MOUTH EVERY MORNING and TAKE ONE TABLET BY MOUTH EVERY EVENING 01/17/24  Yes Debera Jayson MATSU, MD  naloxone  (NARCAN ) 4 MG/0.1ML LIQD nasal spray kit Place 1 spray into the nose as needed (as directed).   Yes [provider]  nitroGLYCERIN  (NITROSTAT ) 0.4 MG SL tablet Place 1 tablet (0.4 mg total) under the tongue every 5 (five) minutesas needed for chest pain. Patient taking differently: 0.4 mg every 5 (five) minutes as needed. Place 1 tablet (0.4 mg total) under the tongue every 5 (five) minutesas needed for chest pain. 12/22/23  Yes Debera Jayson MATSU, MD  temazepam  (RESTORIL ) 30 MG capsule Take 60 mg by mouth at bedtime.   Yes [provider]  TRELEGY ELLIPTA  100-62.5-25 MCG/ACT AEPB SMARTSIG:1 Puff(s) Via Inhaler Every Morning 05/01/24  Yes [provider]    Allergies as of 05/23/2024 - Review Complete 05/23/2024  Allergen Reaction Noted   Fentanyl  Other (See Comments) 05/16/2019    Family History  Problem Relation Age of Onset   COPD Father    Cancer Father    Cancer Mother    Heart attack Brother    Epilepsy Sister    Hypertension Sister    Colon cancer Neg Hx     Social History   Socioeconomic History   Marital status: Widowed    Spouse name: Not on file   Number of children: Not on file   Years of education: Not on file   Highest education level: Not  on file  Occupational History   Occupation: Disabled  Tobacco Use   Smoking status: Every Day    Average packs/day: 2.3 packs/day for 53.0 years (119.3 ttl pk-yrs)    Types: Cigarettes    Start date: 08/28/1969   Smokeless tobacco: Never  Vaping Use   Vaping status: Never Used  Substance and Sexual Activity   Alcohol use: No    Alcohol/week: 0.0 standard drinks of alcohol   Drug use: Yes    Frequency: 7.0 times per week    Types: Marijuana, Other-see comments    Comment: 08/02/2023   Sexual activity: Not Currently  Other Topics Concern   Not on file  Social History Narrative   Pt  lives alone in [place]; on disability   Social Drivers of Health   Financial Resource Strain: Not on file  Food Insecurity: No Food Insecurity (09/03/2023)   Hunger Vital Sign    Worried About Running Out of Food in the Last Year: Never true    Ran Out of Food in the Last Year: Never true  Transportation Needs: No Transportation Needs (09/03/2023)   PRAPARE - Administrator, Civil Service (Medical): No    Lack of Transportation (Non-Medical): No  Physical Activity: Not on file  Stress: Not on file  Social Connections: Not on file  Intimate Partner Violence: Not At Risk (09/03/2023)   Humiliation, Afraid, Rape, and Kick questionnaire    Fear of Current or Ex-Partner: No    Emotionally Abused: No    Physically Abused: No    Sexually Abused: No    Review of Systems: See HPI, otherwise negative ROS  Physical Exam: BP 136/78 (BP Location: Left Arm, Patient Position: Sitting, Cuff Size: Normal)   Pulse 80   Temp 98 F (36.7 C) (Oral)   Ht 5' 10 (1.778 m)   Wt 163 lb (73.9 kg)   SpO2 96%   BMI 23.39 kg/m  General:   Alert,  Well-developed, well-nourished, pleasant and cooperative in NAD Abdomen: Non-distended, normal bowel sounds.  Soft and nontender without appreciable mass or hepatosplenomegaly.  Pulses:  Normal pulses noted. Extremities:  Without clubbing or  edema.  Impression/Plan:    68 year old gentleman with chronic constipation doing very well on Linzess  290 daily.    Unfortunately, diagnosed with lung cancer recently.  History of colonic polyps; due for surveillance colonoscopy in 5 years.  He has requested that I refer him to an ophthalmologist for evaluation of his cataracts.   Recommendations:  Continue Linzess  290  (1) gelcap daily.  Dispense 90 with 3 additional refills  Follow-up colonoscopy due in 5 years  At your request, we will refer you to Dr. Donnice Dally in [place] [address]. [phone].  We we will make the referral from Dr. Shaaron.  See back in 1 year and as needed    Notice: This dictation was prepared with Dragon dictation along with smaller phrase technology. Any transcriptional errors that result from this process are unintentional and may not be corrected upon review.

## 2024-05-24 ENCOUNTER — Ambulatory Visit (INDEPENDENT_AMBULATORY_CARE_PROVIDER_SITE_OTHER): Admitting: Internal Medicine

## 2024-05-24 ENCOUNTER — Encounter: Payer: Self-pay | Admitting: Internal Medicine

## 2024-05-24 ENCOUNTER — Ambulatory Visit (HOSPITAL_COMMUNITY)
Admission: RE | Admit: 2024-05-24 | Discharge: 2024-05-24 | Disposition: A | Discharge status: Home / Self Care | Source: Ambulatory Visit | Attending: Internal Medicine | Admitting: Internal Medicine

## 2024-05-24 VITALS — BP 126/75 | HR 78 | Ht 70.0 in | Wt 162.8 lb

## 2024-05-24 DIAGNOSIS — F17210 Nicotine dependence, cigarettes, uncomplicated: Secondary | ICD-10-CM

## 2024-05-24 DIAGNOSIS — R911 Solitary pulmonary nodule: Secondary | ICD-10-CM | POA: Insufficient documentation

## 2024-05-24 DIAGNOSIS — J449 Chronic obstructive pulmonary disease, unspecified: Secondary | ICD-10-CM | POA: Diagnosis not present

## 2024-05-24 MED ORDER — TRELEGY ELLIPTA 100-62.5-25 MCG/ACT IN AEPB: INHALATION_SPRAY | RESPIRATORY_TRACT | 11 refills | Status: DC

## 2024-05-24 MED ORDER — PREDNISONE 10 MG PO TABS
ORAL_TABLET | ORAL | 0 refills | Status: AC
Start: 2024-05-24 — End: ?

## 2024-05-24 MED ORDER — ALBUTEROL SULFATE HFA 108 (90 BASE) MCG/ACT IN AERS: 2.0000 | INHALATION_SPRAY | RESPIRATORY_TRACT | 3 refills | Status: DC | PRN

## 2024-05-28 NOTE — Assessment & Plan Note (Addendum)
 Active smoker/MM - Labs ordered 09/14/2022  :  allergy screen Eos 0.2  alpha one AT phenotype  197 MM  - 09/14/2022   Walked on RA  x  3  lap(s) =  approx 450  ft  @ mod fast pace, stopped due to end of study with lowest 02 sats 93% mild sob last lap  - 09/14/2022  After extensive coaching inhaler device,  effectiveness =    80% hfa - PFT's  09/09/23  FEV1 2.70 (79 % ) ratio 0.58  p 13 % improvement from saba p Breztri  prior to study with DLCO  20.23 (76%)   and FV curve mildly concave      Group D (now reclassified as E) in terms of symptom/risk and laba/lama/ICS  therefore appropriate rx at this point >>>  trelegy plus approp saba and for mild flare last sev weeks: Prednisone  10 mg take  4 each am x 2 days,   2 each am x 2 days,  1 each am x 2 days and stop

## 2024-05-28 NOTE — Patient Instructions (Addendum)
 Plan A = Automatic = Always=    Trelegy   Plan B = Backup (to supplement plan A, not to replace it) Use your albuterol  inhaler as a rescue medication to be used if you can't catch your breath by resting or slowing your pace  or doing a relaxed purse lip breathing pattern.  - The less you use it, the better it will work when you need it. - Ok to use the inhaler up to 2 puffs  every 4 hours if you must but call for appointment if use goes up over your usual need - Don't leave home without it !!  (think of it like the spare tire or starter fluid for your car)   Plan C = Crisis (instead of Plan B but only if Plan B stops working) - only use your albuterol  nebulizer if you first try Plan B and it fails to help > ok to use the nebulizer up to every 4 hours but if start needing it regularly call for immediate appointment   Prednisone  10 mg take  4 each am x 2 days,   2 each am x 2 days,  1 each am x 2 days and stop   Please remember to go to the  x-ray department  @  [facility] for your tests - we will call you with the results when they are available     The key is to stop smoking completely before smoking completely stops you!     Please schedule a follow up visit in 3 months but call sooner if needed

## 2024-05-28 NOTE — Assessment & Plan Note (Addendum)
 Counseled re importance of smoking cessation but did not meet time criteria for separate billing    F/u q 3 m, sooner if needed  with all meds in hand using a trust but verify approach to confirm accurate Medication  Reconciliation The principal here is that until we are certain that the  patients are doing what we've asked, it makes no sense to ask them to do more.   Each respiratory  medication was reviewed in detail including emphasizing most importantly the difference between maintenance and prns and under what circumstances the prns are to be triggered using an action plan format (ABC)   - see avs for instructions unique to this ov   Total time for H and P, chart review, counseling, reviewing dpi/hfa /neb  device(s) and generating customized AVS unique to this office visit / same day charting = 32 min

## 2024-05-28 NOTE — Assessment & Plan Note (Addendum)
 See LDSCT 06/17/23 Post LUL macrolobulated and slightly spiculated nodule which is new compared to the prior study with a volume derived mean diameter of 17.7 mm, - PET  07/15/23 >   FOB  08/03/23 Pos  Bx Pols NSCL Ca > referred to surgery but delined - 12/29/2023 referred back to RT/ [facility] as does not want surgery > no rx as of 05/24/2024   Appears to have progressive dz/ should reconsider chemo options / advised.  He also has Dr Colton phone number if changes his mind about RT but clearly now now a surgical candidate

## 2024-06-02 ENCOUNTER — Other Ambulatory Visit: Payer: Self-pay

## 2024-06-02 MED ORDER — NITROGLYCERIN 0.4 MG SL SUBL: SUBLINGUAL_TABLET | SUBLINGUAL | 3 refills | Status: AC

## 2024-06-02 MED ORDER — AMLODIPINE BESYLATE 5 MG PO TABS: 5.0000 mg | ORAL_TABLET | Freq: Every day | ORAL | 2 refills | Status: AC

## 2024-06-02 MED ORDER — METOPROLOL SUCCINATE ER 25 MG PO TB24: ORAL_TABLET | ORAL | 2 refills | Status: DC

## 2024-06-06 ENCOUNTER — Other Ambulatory Visit: Payer: Self-pay

## 2024-06-06 ENCOUNTER — Telehealth: Payer: Self-pay

## 2024-06-06 ENCOUNTER — Ambulatory Visit: Payer: Self-pay | Admitting: Internal Medicine

## 2024-06-06 MED ORDER — TRELEGY ELLIPTA 100-62.5-25 MCG/ACT IN AEPB: INHALATION_SPRAY | RESPIRATORY_TRACT | 11 refills | Status: AC

## 2024-06-06 MED ORDER — ALBUTEROL SULFATE HFA 108 (90 BASE) MCG/ACT IN AERS: 2.0000 | INHALATION_SPRAY | RESPIRATORY_TRACT | 3 refills | Status: AC | PRN

## 2024-06-06 NOTE — Progress Notes (Signed)
 Spoke with pt and relayed results, confirmed understanding nfn

## 2024-06-06 NOTE — Telephone Encounter (Signed)
 Called to inform [employer] that pt is requesting we send trelegy and ventolin  to [facility] and for them to d/c the rx. nfn

## 2024-06-08 ENCOUNTER — Other Ambulatory Visit: Payer: Self-pay

## 2024-06-08 MED ORDER — LINACLOTIDE 290 MCG PO CAPS: 290.0000 ug | ORAL_CAPSULE | Freq: Every day | ORAL | 11 refills | Status: AC

## 2024-06-10 ENCOUNTER — Other Ambulatory Visit: Payer: Self-pay | Admitting: Internal Medicine

## 2024-06-12 ENCOUNTER — Other Ambulatory Visit: Payer: Self-pay | Admitting: Cardiology

## 2024-06-26 ENCOUNTER — Ambulatory Visit: Admitting: Cardiology

## 2024-10-15 ENCOUNTER — Other Ambulatory Visit: Payer: Self-pay | Admitting: Cardiology
